# Patient Record
Sex: Male | Born: 1960
Health system: Southern US, Community
[De-identification: ages and names within clinical notes are randomized; demographics above are authoritative.]

## PROBLEM LIST (undated history)

## (undated) DIAGNOSIS — N390 Urinary tract infection, site not specified: Secondary | ICD-10-CM

## (undated) DIAGNOSIS — I10 Essential (primary) hypertension: Secondary | ICD-10-CM

## (undated) DIAGNOSIS — K859 Acute pancreatitis without necrosis or infection, unspecified: Secondary | ICD-10-CM

## (undated) DIAGNOSIS — Q6410 Exstrophy of urinary bladder, unspecified: Secondary | ICD-10-CM

## (undated) DIAGNOSIS — Z9289 Personal history of other medical treatment: Secondary | ICD-10-CM

## (undated) DIAGNOSIS — Q899 Congenital malformation, unspecified: Secondary | ICD-10-CM

## (undated) DIAGNOSIS — Z87442 Personal history of urinary calculi: Secondary | ICD-10-CM

## (undated) DIAGNOSIS — I2510 Atherosclerotic heart disease of native coronary artery without angina pectoris: Secondary | ICD-10-CM

## (undated) DIAGNOSIS — C801 Malignant (primary) neoplasm, unspecified: Secondary | ICD-10-CM

## (undated) DIAGNOSIS — N182 Chronic kidney disease, stage 2 (mild): Secondary | ICD-10-CM

## (undated) DIAGNOSIS — E785 Hyperlipidemia, unspecified: Secondary | ICD-10-CM

## (undated) HISTORY — DX: Atherosclerotic heart disease of native coronary artery without angina pectoris: I25.10

## (undated) HISTORY — DX: Personal history of other medical treatment: Z92.89

## (undated) HISTORY — PX: KIDNEY SURGERY: SHX687

## (undated) HISTORY — DX: Essential (primary) hypertension: I10

## (undated) HISTORY — DX: Acute pancreatitis without necrosis or infection, unspecified: K85.90

## (undated) HISTORY — DX: Personal history of urinary calculi: Z87.442

## (undated) HISTORY — DX: Urinary tract infection, site not specified: N39.0

## (undated) HISTORY — DX: Congenital malformation, unspecified: Q89.9

---

## 1998-09-21 ENCOUNTER — Encounter: Payer: Self-pay | Admitting: Endocrinology

## 1998-09-21 ENCOUNTER — Encounter: Payer: Self-pay | Admitting: Emergency Medicine

## 1998-09-21 ENCOUNTER — Inpatient Hospital Stay (HOSPITAL_COMMUNITY): Admission: EM | Admit: 1998-09-21 | Discharge: 1998-09-24 | Payer: Self-pay | Admitting: Emergency Medicine

## 2006-03-08 HISTORY — PX: CORONARY ARTERY BYPASS GRAFT: SHX141

## 2006-03-17 ENCOUNTER — Emergency Department (HOSPITAL_COMMUNITY): Admission: EM | Admit: 2006-03-17 | Discharge: 2006-03-17 | Payer: Self-pay | Admitting: Emergency Medicine

## 2006-03-20 ENCOUNTER — Ambulatory Visit (HOSPITAL_COMMUNITY): Admission: RE | Admit: 2006-03-20 | Discharge: 2006-03-20 | Payer: Self-pay | Admitting: Cardiology

## 2006-03-20 HISTORY — PX: CARDIAC CATHETERIZATION: SHX172

## 2006-03-31 ENCOUNTER — Ambulatory Visit: Payer: Self-pay | Admitting: Surgery

## 2006-04-07 ENCOUNTER — Inpatient Hospital Stay (HOSPITAL_COMMUNITY): Admission: RE | Admit: 2006-04-07 | Discharge: 2006-04-12 | Payer: Self-pay | Admitting: Surgery

## 2006-04-07 ENCOUNTER — Ambulatory Visit: Payer: Self-pay | Admitting: Surgery

## 2006-05-06 ENCOUNTER — Ambulatory Visit: Payer: Self-pay | Admitting: Surgery

## 2006-05-19 ENCOUNTER — Encounter (HOSPITAL_COMMUNITY): Admission: RE | Admit: 2006-05-19 | Discharge: 2006-07-14 | Payer: Self-pay | Admitting: Cardiology

## 2009-06-12 HISTORY — PX: CARDIOVASCULAR STRESS TEST: SHX262

## 2009-07-26 ENCOUNTER — Encounter: Admission: RE | Admit: 2009-07-26 | Discharge: 2009-07-26 | Payer: Self-pay | Admitting: Cardiology

## 2009-07-28 ENCOUNTER — Inpatient Hospital Stay (HOSPITAL_BASED_OUTPATIENT_CLINIC_OR_DEPARTMENT_OTHER): Admission: RE | Admit: 2009-07-28 | Discharge: 2009-07-28 | Payer: Self-pay | Admitting: Cardiology

## 2009-07-28 HISTORY — PX: CARDIAC CATHETERIZATION: SHX172

## 2009-08-11 ENCOUNTER — Ambulatory Visit: Payer: Self-pay | Admitting: Cardiology

## 2010-05-22 NOTE — Discharge Summary (Signed)
NAME:  Andrew Joyce, Andrew Joyce             ACCOUNT NO.:  [id number]   MEDICAL RECORD NO.:  [id number]          PATIENT TYPE:  INP   LOCATION:  2035                         FACILITY:  [facility]   PHYSICIAN:  Evelene Croon, M.D.     DATE OF BIRTH:  Jun 07, 1960   DATE OF ADMISSION:  04/07/2006  DATE OF DISCHARGE:  04/12/2006                               DISCHARGE SUMMARY   ADDENDUM:  This is an addendum to a previously dictated discharge summary.  See job  number [id number] for details outlining admission and discharge diagnoses,  history and hospital course through April 10, 2006.   Initially, it was anticipated Mr. Deman would be discharged home  postoperative day four, April 11, 2006; however during morning rounds he  was noted to have had a fever overnight with temperature of 101.3.  Source is felt most likely secondary to atelectasis or pericarditis.  His chest x-ray on April 2nd showed decreased lung volumes with  worsening bibasilar atelectasis.  Encouraged more aggressive incentive  spirometry and a flutter valve was added.  He was also treated with 48  hours of Indocin, as he had already received 48 hours of Toradol for  postoperative pericarditis.  A urinalysis was also sent which showed few  bacteria, but otherwise was unremarkable.  He was restarted on his home  regimen of Bactrim.  Urine culture ultimately showed insignificant  growth.  His white blood count was normal at 7.7.  Following morning,  his high fevers had resolved although with still intermittently low-  grade temperatures around 100.  Otherwise, there is no significant  change from his previous dictated discharge summary and he was felt  appropriate for discharge home on postoperative day five, May 12, 2006,  in stable condition.   UPDATED DISCHARGE MEDICATIONS:  1. Coated aspirin 325 mg p.o. every day.  2. Lopressor 25 mg p.o. b.i.d.  3. Quinapril 20 mg p.o. every day.  4. Lipitor 20 mg p.o. every day.  5. Tricor 145 mg  p.o. every day.  6. Bactrim p.o. b.i.d.  7. Oxycodone 5 mg one or two tablets p.o. q.4-6 hours p.r.n. pain.   DISCHARGE INSTRUCTIONS:  As previously dictated.     Jerold Coombe, P.A.      Evelene Croon, M.D.  Electronically Signed   AWZ/MEDQ  D:  06/03/2006  T:  06/03/2006  Job:  [id number]   cc:   Peter M. [place], M.D.

## 2010-05-25 NOTE — Cardiovascular Report (Signed)
NAME:  Andrew Joyce, Andrew Joyce NO.:  [id number]   MEDICAL RECORD NO.:  [id number]          PATIENT TYPE:  OIB   LOCATION:  2899                         FACILITY:  [facility]   PHYSICIAN:  Peter M. Swaziland, M.D.  DATE OF BIRTH:  [birth year]   DATE OF PROCEDURE:  03/20/2006  DATE OF DISCHARGE:  03/20/2006                            CARDIAC CATHETERIZATION   INDICATIONS FOR PROCEDURE:  A 50 year old white male who presented with  recent onset of angina.  He had an abnormal stress Cardiolite study  showing evidence of inferior wall ischemia.  He has a history of severe  hypertension and hyperlipidemia.   PROCEDURE:  1. Left heart catheterization.  2. Coronary and left ventricular angiography.   EQUIPMENT:  Used a 6-French 4-cm right and left Judkins catheters, a 6-  French pigtail catheter, a 6-French arterial sheath.   MEDICATIONS:  Local anesthesia of 1% Xylocaine, Versed 2 mg IV.   CONTRAST:  Omnipaque 105 mL.   HEMODYNAMIC DATA:  Aortic pressures 120/78 with a mean of 97-mmHg, left  ventricular pressure is 127 with EDP of 10-mmHg.   ANGIOGRAPHIC DATA:  Left coronary artery arises and distributes  normally.  The left main coronary artery is normal.   The left anterior descending artery has a fusiform 70% stenosis in the  proximal vessel.  The remainder of the vessel has scattered wall  irregularities.   The left circumflex coronary is a co-dominant vessel.  It gives rise to  a single large bifurcating marginal vessel and then continues in the AV  groove to give off some posterolateral branches distally.  The obtuse  marginal vessel has a 60-70% stenosis at the bifurcation involving both  branches.   The right coronary arises normally.  He gives rise to the PDA.  It is  relatively small in caliber and diffusely diseased.  There is diffuse  90% stenosis involving the proximal vessel and the mid vessel.  The  distal vessel has segmental disease up to 60-70%.   LEFT  VENTRICULAR ANGIOGRAPHY:  Performed in the RAO view demonstrates a  normal left ventricular size and contractility with normal systolic  function.  Ejection fraction is estimated 60-65%.   ABDOMINAL AORTOGRAPHY:  Was performed to rule out renal artery stenosis  in a patient with severe hypertension.  This demonstrates normal  abdominal aorta.  The celiac, mesenteric, and renal arteries are widely  patent.   FINAL INTERPRETATION:  1. Three-vessel obstructive coronary disease.  The patient has      moderate stenosis in the proximal left anterior descending artery      and the bifurcating obtuse marginal vessel.  He has severe diffuse      disease in the right coronary.  2. Normal left ventricle function.  3. No evidence of renal artery stenosis.   PLAN:  The patient's right coronary is poorly suited to percutaneous  intervention given its small caliber and diffuse disease.  Given the  moderate disease in the left coronary system, I would consider coronary  artery bypass surgery as a more attractive treatment option.  ______________________________  Peter M. [place], M.D.     PMJ/MEDQ  D:  03/20/2006  T:  03/21/2006  Job:  [id number]   cc:   Alfonse Alpers. Dagoberto Ligas, M.D.

## 2010-05-25 NOTE — Discharge Summary (Signed)
NAME:  Andrew Joyce, Andrew Joyce             ACCOUNT NO.:  [id number]   MEDICAL RECORD NO.:  [id number]          PATIENT TYPE:  INP   LOCATION:  2035                         FACILITY:  [facility]   PHYSICIAN:  Evelene Croon, M.D.     DATE OF BIRTH:  20-Apr-1960   DATE OF ADMISSION:  04/07/2006  DATE OF DISCHARGE:  04/11/2006                               DISCHARGE SUMMARY   ADMISSION DIAGNOSIS:  Severe three-vessel coronary artery disease.   DISCHARGE/SECONDARY DIAGNOSES:  1. Severe three-vessel coronary artery disease status post coronary      artery bypass grafting.  2. Hypertension.  3. Hyperlipidemia and hypertriglyceridemia (with levels over 3000).  4. History of pancreatitis in 2002 secondary to hypertriglyceridemia.  5. History of exstrophy of the bladder as a child status post surgical      repair.  6. History of recurrent urinary tract infections and bladder stones.  7. No known drug allergies.  8. Postoperative acute blood loss anemia requiring transfusion.  9. Postoperative pericarditis, resolving.   PROCEDURES:  April 07, 2006:  A mediastinotomy for coronary artery  bypass graft surgery x5 using the left internal mammary artery to the  left anterior descending, saphenous vein graft to the acute marginal  branch of the right coronary artery, saphenous graft to the posterior  descending branch of the right coronary artery, sequential saphenous  vein graft to the first and second obtuse marginal branches of the left  circumflex coronary artery, endoscopic vein harvest obtained from  bilateral legs.   SURGEON:  Dr. Evelene Croon   BRIEF HISTORY:  Andrew Joyce is a 50 year old male with history of  hypertension and severe hypertriglyceridemia who presented to the [facility] emergency department on March 17, 2006 with substernal chest pain  radiating to his left arm with numbness in his left hand.  His initial  enzymes were negative.  Electrocardiogram was normal.  Cardiolite scan  showed evidence of inferior wall ischemia.  There was normal left  ventricular function.  Cardiac catheterization on March 13 showed  significant three-vessel coronary artery disease.  The left main was  normal.  The left ventricular ejection fraction was 60 to 65% with no  mitral regurgitation, no gradient across the aortic valve.  He was  subsequently referred for cardiac surgery evaluation and was seen on  outpatient basis by Dr. Laneta Simmers on March 31, 2006.  After review of the  angiogram and examination of the patient, he believed that coronary  artery bypass graft surgery was the best treatment option to prevent  further ischemia and infarction.  After discussing risks and benefits,  the patient agreed to proceed.  Of note, he had a normal carotid duplex  scan preoperatively.   HOSPITAL COURSE:  On April 07, 2006, Andrew Joyce was electively  admitted to [facility] and underwent coronary artery bypass  graft surgery.  Postoperatively, he was transferred to the surgical care  unit and he was in hemodynamically stable condition.  By postoperative  day one, he had been extubated neurologically intact.  He did require  some Neo-Synephrine for postoperative vasodilation.  He also was  treated  for postoperative acute blood loss anemia for a hemoglobin of 7.8 and  was transfused one unit of packed red blood cells.  Postoperative EKG  showed diffuse ST changes consistent with pericarditis.  This  subsequently improved over the course of a couple of days.  He was  ________ from the Neo-Synephrine and chest tube ________ were  discontinued on postoperative day one.  Per cardiac surgery protocol,  his blood sugars were monitored postoperatively and he initially was  treated with Lantus insulin, but this was ultimately discontinued as his  sugars normalized.  His hemoglobin A1C was normal at 5.7.  Postoperative  day two, Andrew Joyce was felt appropriate for transfer out of surgical   intensive care unit and out to telemetry unit 2000 where it is  anticipated he will remain until discharge.  He has remained stable and  after discharge he has maintained normal sinus rhythm and vital signs  remained stable with systolic blood pressure ranging from 102 to 132,  diastolic in the 60s to mid-70s.  Currently, he is tolerating 25 mg  twice-a-day regimen of Lopressor.  His ACE inhibitor will be resumed as  his blood pressure allows which may be on an outpatient basis.  His  oxygen saturation had been above 92% on room air.  His weight is nearly  at its baseline.  He was treated with a few-day course of diuretic  therapy.  He has had intermittent low-grade fevers of around 100 which  is felt likely secondary to atelectasis.  Mobility has been encouraged  with nursing and cardiac rehab staff as well as aggressive pulmonary  toilet.  Chest x-ray has shown low lung volumes with bibasilar  atelectasis, but no pneumothorax.  His incisions were healing well  without sign of infection.  His pain is controlled on oral medication  and he is making progress with cardiac rehab.  He has been tolerating  diet and bowel and bladder with good returning function.  If he  continues to make steady progress and there are no significant changes  in status, it is anticipated he will be ready for discharge home on  postoperative day four, April 11, 2006.   His most recent labs show white blood count of 7.7, hemoglobin and  hematocrit of 8 and 22.9, respectively, platelet count 110, sodium 139,  potassium 4.3, chloride 105, CO2 28, BUN 14, creatinine 1.09.  Fasting  blood glucose is 146.  LFTs were normal.  Hemoglobin A1c 5.7.   DISCHARGE MEDICATIONS:  1. Coated aspirin 325 mg p.o. every day.  2. Lopressor 25 mg p.o. b.i.d.  3. Lipitor 20 mg p.o. every day.  4. Tricor 145 mg p.o. every day. 5. Oxycodone 5 mg one to two tablets p.o. q.4-6 h p.r.n. pain.   DISCHARGE INSTRUCTIONS:  He is to avoid  driving or heavy lifting more  than ten pounds.  He is encouraged to continue daily walking and  breathing exercises.  He is to follow a low fat, low-salt diet.  He may  shower and clean incisions gently with soap and water, should notify Dr.  Sharee Pimple office if he develops fever greater than 101 or redness or  drainage from his incision sites.   FOLLOWUP:  He is to follow up with Dr. Laneta Simmers in his office  approximately three weeks and he should call to schedule to follow up  with Dr. Peter Sw[place]iland and should have a chest x-ray at this appointment  as well.  Jerold Coombe, P.A.      Evelene Croon, M.D.  Electronically Signed    AWZ/MEDQ  D:  04/10/2006  T:  04/10/2006  Job:  [id number]   cc:   Peter M. [place], M.D.

## 2010-05-25 NOTE — H&P (Signed)
NAME:  NUSSEN, PULLIN NO.:  [id number]   MEDICAL RECORD NO.:  [id number]          PATIENT TYPE:  EMS   LOCATION:  MAJO                         FACILITY:  [facility]   PHYSICIAN:  Peter M. [place], M.D.  DATE OF BIRTH:  [birth year]   DATE OF ADMISSION:  03/17/2006  DATE OF DISCHARGE:                              HISTORY & PHYSICAL   HISTORY OF PRESENT ILLNESS:  Mr. Glace is a 50 year old white male  who has a history of hypertension, hyperlipidemia and presents to the  emergency department for evaluation of chest pain.  The patient states  he felt fine until this morning.  He was at work approximately 8:00 a.m.  He was doing some simple task at the office without any heavy lifting or  straining, when he developed the sudden gripping midsternal chest pain  associated with a symptom that he could not take a deep breath.  He then  develop some left hand numbness.  He had some mild sweating and a  shiver.  He had no nausea, vomiting.  He had no prior history of chest  pain.  This pain was at least moderate intensity for the first 45  minutes and then abated with a low-grade discomfort lasting  approximately another 45 minutes to an hour. His pain has now completely  resolved and he feels fine.   PAST MEDICAL HISTORY:  Significant for hypertension.  He has a history  of hyperlipidemia with prior triglyceride level over 3000.  He has a  history of pancreatitis in 2000 secondary to his hypertriglyceridemia.  He has a history of exstrophy as of a bladder as a child and had surgery  for repair.  He has had recurrent urinary tract infections and bladder  stones.   CURRENT MEDICATIONS:  1. Antara 130 mg per day.  2. Tenormin 100 mg per day.  3. Norvasc 10 mg per day.  4. Accupril 20 mg per day.  5. Maxzide 25 mg 1/2 tablet daily.   ALLERGIES:  No known allergies.   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  He is married  and has 2 children.  He denies  tobacco or alcohol use.   FAMILY HISTORY:  Positive for hypercholesterolemia and hypertension.  He  has no history of coronary disease.   PHYSICAL EXAMINATION:  Patient is a pleasant white male, no distress.  Blood pressure 142/90, pulse 73 and regular, respirations were 20 and  unlabored.  He is afebrile.  Sats are  99% on room air.  HEENT EXAM:  Unremarkable.  He has no JVD, adenopathy or bruits.  LUNGS:  Clear.  CARDIAC EXAM:  A regular rate and rhythm without gallop, murmur, rub or  click.  ABDOMEN:  Soft, nontender.  There is no chest wall tenderness to  palpation.  There are no masses.  EXTREMITIES:  Without edema.  Pulses are 2+ and symmetric.  He has no  phlebitis.  NEUROLOGIC EXAM:  Nonfocal.   LABORATORY DATA:  Chest x-ray is normal. ECG is normal.  White count  4600, hemoglobin 14.3, hematocrit  40.5, platelets 220,000.  Sodium is  134, potassium 5.1, chloride 109, CO2 30, BUN 11, creatinine 1, glucose  150.  LFTs were normal.  Point of care cardiac enzymes are negative x2.   IMPRESSION:  1. Chest pain with atypical features.  2. Hypertension.  3. Hyperlipidemia.   PLAN:  The patient was given aspirin in the emergency department.  We  discussed admitting him overnight for observation.  However, the patient  is very adamant about wanting to return home.  As such, we have  scheduled him for a stress Cardiolite study tomorrow at noon at our  office.  He is to remain on aspirin daily.  He is to call and return to  the emergency department if he has any recurrent chest pain.  I have  recommended he go home and relax today and perform no work today.           ______________________________  Peter M. [place], M.D.     PMJ/MEDQ  D:  03/17/2006  T:  03/17/2006  Job:  [id number]   cc:   Alfonse Alpers. Dagoberto Ligas, M.D.  Bertram Millard. Dahlstedt, M.D.

## 2010-05-25 NOTE — Op Note (Signed)
NAME:  Andrew Joyce, Andrew Joyce             ACCOUNT NO.:  [id number]   MEDICAL RECORD NO.:  [id number]          PATIENT TYPE:  INP   LOCATION:  2307                         FACILITY:  [facility]   PHYSICIAN:  Evelene Croon, M.D.     DATE OF BIRTH:  [birth year]   DATE OF PROCEDURE:  04/07/2006  DATE OF DISCHARGE:                               OPERATIVE REPORT   PREOPERATIVE DIAGNOSIS:  Severe three vessel coronary artery disease.   POSTOPERATIVE DIAGNOSIS:  Severe three vessel coronary artery disease.   OPERATIVE PROCEDURE:  Median sternotomy, extracorporeal circulation,  coronary artery bypass graft surgery times 5 using a left internal  mammary artery graft to the left anterior descending coronary artery,  with a saphenous vein graft to the acute marginal branch of the right  coronary artery, a saphenous vein graft to the posterior descending  branch of the right coronary artery, and a sequential saphenous vein  graft to the first and second obtuse marginal branches of the left  circumflex coronary artery; endoscopic vein harvesting from both legs.   ATTENDING SURGEON:  Evelene Croon, M.D.   ASSISTANTS:  1. Salvatore Decent. Cornelius Moras, M.D.  2. Rowe Clack, P.A.-C.   ANESTHESIA:  General endotracheal.   CLINICAL HISTORY:  This patient is a 50 year old gentleman with a  history of hypertension and severe hyperlipidemia, who presented to  [facility] Emergency Department on 03/17/2006 with substernal chest pain  radiating to his left arm, with numbness in his left hand.  His initial  enzymes were negative.  Electrocardiogram was normal.  Cardiolite scan  showed evidence of inferior wall ischemia.  There was normal left  ventricular function.  Cardiac catheterization on 03/20/2006 showed  significant three vessels coronary artery disease.  The left main was  normal.  The LAD had a long fusiform 70% proximal stenosis.  The left  circumflex was a codominant vessel and gave rise to a single large  bifurcating marginal vessel that had a 60 to 70% bifurcation stenosis  involving both branches.  The right coronary artery was a relatively  small vessel that was diffusely diseased, up to 90% proximally, and in  the midportion.  The distal vessel had 60 to 70% stenosis before the  takeoff of the posterior descending branch.  There was a moderate-size  acute marginal branch that high-grade proximal stenosis.  The left  ventricular ejection fraction was 60 to 65%, with no mitral  regurgitation and no gradient across the aortic valve.  After review of  the angiogram and examination of the patient, it was felt that coronary  artery bypass graft surgery was the best treatment to prevent further  ischemia and infarction.  I discussed the operative procedure with the  patient and his wife, including alternatives, benefits and risks,  including but not limited to bleeding, blood transfusion, infection,  stroke, myocardial infarction, graft failure, and death.  He understood  and agreed to proceed.   OPERATIVE PROCEDURE:  The patient was taken to the operating room and  placed on the table in supine position.  After induction of general  endotracheal anesthesia, a Foley catheter was  placed in the bladder  using sterile technique.  Then, the chest, abdomen and both lower  extremities were prepped and draped in the usual sterile manner.  The  chest was entered through a median sternotomy incision and the  pericardium opened in the midline.  Examination of the heart showed good  ventricular contractility.  The ascending aorta had no palpable plaques  in it.   Then, the left internal mammary artery was harvested from the chest wall  as a pedicle graft.  This was a medium-caliber vessel with excellent  blood flow through it.  At the same time, a segment of greater saphenous  vein was harvested from the right leg using endoscopic vein harvest  technique.  This vein and thigh was medium size and good  quality.  Below  the knee, the vein was small and not felt to be optimal.  Therefore,  another section of vein was harvested from the left thigh using  endoscopic vein harvest technique.  This vein was medium size and of  good quality.   Then, the patient was heparinized, and when an adequate activated  clotting time was achieved, the distal ascending aorta was cannulated  using a 20-French aortic cannula for arterial inflow.  Venous outflow  was achieved using a 2-stage venous cannula through the right atrial  appendage.  An antegrade cardioplegia and vent cannula was inserted in  the aortic root.   The patient was placed on cardiopulmonary bypass and the distal  coronaries identified.  The LAD was a large graftable vessel in its  midportion.  It quickly became a relatively small vessel and did not  reach to the apex.  It had no significant distal disease in it.  The 2  marginal branches were both large, graftable vessels with no significant  distal disease present.  The right coronary artery gave off a small-to-  medium size acute marginal branch that was felt to be graftable, as well  as a moderate-size posterior descending branch that was graftable.  The  main body of the right coronary artery was diffusely diseased with  plaque.   Then, the aorta was crossclamped and 1000 mL of cold-blood antegrade  cardioplegia was administered into the aortic root, with quick arrest of  the heart.  Systemic hypothermia to 28 degrees Centigrade and topical  hypothermia with ice saline was used.  A temperature probe was placed on  the septum and an insulating pad on the pericardium.   The first distal anastomosis was performed first to the marginal branch.  The internal diameter of this vessel was about 1.75 mm.  The conduit  used was a segment of greater saphenous vein, and the anastomosis was  performed in a sequential, side-to-side manner using continuous 7-0 Prolene suture.  Flow was  measured through the graft and was excellent.   The second distal anastomosis was performed to the second marginal  branch.  The internal diameter was also about 1.75 mm.  The conduit used  was the same segment of greater saphenous vein, and the anastomosis was  performed in an end-to-side manner using continuous 7-0 Prolene suture.  Flow was measured through the graft and was excellent.  Then, another  dose of cardioplegia was given down the vein grafts and in the aortic  root.   The third distal anastomosis was performed to the posterior descending  coronary artery.  The internal diameter proximally was about 1.75 mm.  The conduit used was a third segment of greater  saphenous vein, and the  anastomosis was performed in an end-to-side manner using continuous 7-0  Prolene suture.  Flow was measured through the graft and was excellent.   The fourth distal anastomosis was performed to the acute marginal  branch.  The internal diameter was about 1.5 to 1.6 mm.  The conduit  used was the third segment of greater saphenous vein, and the  anastomosis was performed in an end-to-side manner using continuous 7-0  Prolene suture.  Flow was measured through the graft and was good.   Then, the fifth distal anastomosis was performed to the midportion of  the left anterior descending coronary artery.  The internal diameter was  about 2 mm.  The conduit used was a left internal mammary artery graft,  and it was brought through an opening in the left pericardium anterior  to the phrenic nerve.  It was anastomosed to the LAD in end-to-side  manner using continuous 8-0 Prolene suture.  The pedicle was sutured to  the epicardium with 6-0 Prolene sutures.  The patient was then rewarmed  to 37 degrees Centigrade, and another dose of antegrade cardioplegia was  given.  With the crossclamp in place, the 3 proximal vein graft  anastomoses were performed to the aortic root in end-to-side manner  using  continuous 6-0 Prolene suture.  Then, the clamp was removed from  the mammary artery pedicle.  There was rapid warming of the ventricular  septum and return of spontaneous ventricular fibrillation.  The  crossclamp was removed at a time of 88 minutes, and the patient  spontaneously converted to sinus rhythm.  The proximal and distal  anastomoses appeared hemostatic and the lie of the grafts satisfactory.  A graft marker was placed around the proximal anastomoses.  Two  temporary right ventricular and right atrial pacing wires were placed  through the skin.   When the patient rewarmed to 37 degrees Centigrade, he was weaned from  cardiopulmonary bypass on no inotropic agents.  The total bypass time  was 110 minutes.  Cardiac function appeared excellent, with a cardiac  output of 6 L per minute.  Protamine was given and the venous and aortic  cannulae were removed without difficulty.  Hemostasis was achieved. Three chest tubes were placed with 2 in the posterior pericardium, one  in the left pleural space and one in the anterior mediastinum.  The  pericardium was loosely reapproximated over the heart.  The sternum was  closed with #6 stainless steel wires.  The fascia was closed with a  continuous #1 Vicryl suture.  The subcutaneous tissues were closed with  a continuous 2-0 Vicryl, and the skin with a 3-0 Vicryl subcuticular  closure.  The lower extremity vein harvest sites were closed in layers  in a similar manner.  The sponge, needle and instrument counts were  correct according to the scrub nurse.  Dry sterile dressings were  applied over the incisions and around the chest tubes, which were hooked  to Pleur-evac suction.  The patient remained hemodynamically stable and  was transported to the SICU in guarded, stable condition.      Evelene Croon, M.D.  Electronically Signed     BB/MEDQ  D:  04/07/2006  T:  04/07/2006  Job:  [id number]   cc:   Dr Peter Swaziland  Cardiac Cath Lab,  [facility]

## 2010-08-13 ENCOUNTER — Other Ambulatory Visit: Payer: Self-pay | Admitting: Cardiology

## 2010-08-13 NOTE — Telephone Encounter (Signed)
escribe medication per fax request  

## 2010-12-12 ENCOUNTER — Other Ambulatory Visit: Payer: Self-pay | Admitting: Cardiology

## 2010-12-24 ENCOUNTER — Other Ambulatory Visit: Payer: Self-pay | Admitting: *Deleted

## 2010-12-25 ENCOUNTER — Telehealth: Payer: Self-pay | Admitting: Cardiology

## 2010-12-25 MED ORDER — QUINAPRIL HCL 40 MG PO TABS: 40.0000 mg | ORAL_TABLET | Freq: Every day | ORAL | Status: DC

## 2010-12-25 NOTE — Telephone Encounter (Signed)
Spoke w/wife. She states he missed his app last year because he lost his job and didn't have health insurance. Refilled his Quinipril but advised her he needs to keep his app in Jan.

## 2010-12-25 NOTE — Telephone Encounter (Signed)
New msg Pt wants refill of quinipril sent to [facility] on [address] untile his appt in January

## 2011-01-18 ENCOUNTER — Other Ambulatory Visit: Payer: Self-pay | Admitting: Cardiology

## 2011-01-18 ENCOUNTER — Encounter: Payer: Self-pay | Admitting: Cardiology

## 2011-01-18 MED ORDER — QUINAPRIL HCL 40 MG PO TABS: 40.0000 mg | ORAL_TABLET | Freq: Every day | ORAL | Status: DC

## 2011-01-18 NOTE — Telephone Encounter (Signed)
New msg His appt was moved from 1/18 to 1/30 and he needs refill of quinapril

## 2011-01-25 ENCOUNTER — Ambulatory Visit: Payer: Self-pay | Admitting: Cardiology

## 2011-02-06 ENCOUNTER — Encounter: Payer: Self-pay | Admitting: Nurse Practitioner

## 2011-02-06 ENCOUNTER — Ambulatory Visit (INDEPENDENT_AMBULATORY_CARE_PROVIDER_SITE_OTHER): Payer: Self-pay | Admitting: Nurse Practitioner

## 2011-02-06 ENCOUNTER — Ambulatory Visit: Payer: Self-pay | Admitting: Cardiology

## 2011-02-06 VITALS — BP 142/108 | HR 83 | Ht 70.0 in | Wt 178.0 lb

## 2011-02-06 DIAGNOSIS — I10 Essential (primary) hypertension: Secondary | ICD-10-CM | POA: Insufficient documentation

## 2011-02-06 DIAGNOSIS — E785 Hyperlipidemia, unspecified: Secondary | ICD-10-CM

## 2011-02-06 DIAGNOSIS — I2510 Atherosclerotic heart disease of native coronary artery without angina pectoris: Secondary | ICD-10-CM

## 2011-02-06 LAB — HEPATIC FUNCTION PANEL
ALT: 29 U/L (ref 0–53)
AST: 30 U/L (ref 0–37)
Albumin: 4.5 g/dL (ref 3.5–5.2)
Alkaline Phosphatase: 58 U/L (ref 39–117)
Bilirubin, Direct: 0 mg/dL (ref 0.0–0.3)
Total Bilirubin: 0.5 mg/dL (ref 0.3–1.2)
Total Protein: 7.8 g/dL (ref 6.0–8.3)

## 2011-02-06 LAB — BASIC METABOLIC PANEL
BUN: 30 mg/dL — ABNORMAL HIGH (ref 6–23)
CO2: 26 mEq/L (ref 19–32)
Calcium: 9.5 mg/dL (ref 8.4–10.5)
Chloride: 101 mEq/L (ref 96–112)
Creatinine, Ser: 1.8 mg/dL — ABNORMAL HIGH (ref 0.4–1.5)
GFR: 41.96 mL/min — ABNORMAL LOW (ref >60.00)
Glucose, Bld: 113 mg/dL — ABNORMAL HIGH (ref 70–99)
Potassium: 4.4 mEq/L (ref 3.5–5.1)
Sodium: 139 mEq/L (ref 135–145)

## 2011-02-06 LAB — LIPID PANEL
Cholesterol: 278 mg/dL — ABNORMAL HIGH (ref 0–200)
HDL: 36.1 mg/dL — ABNORMAL LOW (ref >39.00)
Total CHOL/HDL Ratio: 8
Triglycerides: 753 mg/dL — ABNORMAL HIGH (ref 0.0–149.0)
VLDL: 150.6 mg/dL — ABNORMAL HIGH (ref 0.0–40.0)

## 2011-02-06 MED ORDER — NITROGLYCERIN 0.4 MG SL SUBL: 0.4000 mg | SUBLINGUAL_TABLET | SUBLINGUAL | Status: DC | PRN

## 2011-02-06 MED ORDER — LISINOPRIL 40 MG PO TABS: 40.0000 mg | ORAL_TABLET | Freq: Every day | ORAL | Status: DC

## 2011-02-06 MED ORDER — PRAVASTATIN SODIUM 40 MG PO TABS: 40.0000 mg | ORAL_TABLET | Freq: Every evening | ORAL | Status: DC

## 2011-02-06 NOTE — Assessment & Plan Note (Signed)
He has been out of his Lipitor. No recent labs. Will place him on Pravachol 40 mg.

## 2011-02-06 NOTE — Assessment & Plan Note (Addendum)
He has known CAD with prior CABG. Last cath in 2011 showed the SVG to the acute margin/PD to be occluded. Native RCA is occluded. He is managed medically. Now with more symptoms but in the setting of hypertension. He has been out of his medicines for some time. We will get him back on 4 dollar medicine that he can afford. We will check his labs today. He cannot afford a stress test at this time. I have refilled his NTG as well. I am planning on seeing him back in one month. He is to call or go to the ER for any worsening of his symptoms. Patient is agreeable to this plan and will call if any problems develop in the interim.

## 2011-02-06 NOTE — Assessment & Plan Note (Signed)
Blood pressure is up. He has been out of his medicines. We will place him on Lisinopril 40 mg daily.

## 2011-02-06 NOTE — Patient Instructions (Signed)
We are going to check your labs today.  We are going to put you on Lisinopril 40 mg daily for your blood pressure  We are going to put you on Pravachol 40 mg daily for your cholesterol  I would like to see you in a month.  I have also refilled your NTG to [facility].  Use your NTG under your tongue for recurrent chest pain. May take one tablet every 5 minutes. If you are still having discomfort after 3 tablets in 15 minutes, call 911.

## 2011-02-06 NOTE — Progress Notes (Signed)
Andrew Joyce Date of Birth: May 24, 1960 Medical Record #[id number]  History of Present Illness: [name] is seen today for a follow up visit. He is seen for Dr. Swaziland. It is an approximate 17 month check. He has known CAD with prior CABG in 2008. Last cath was in 2011. His grafts were patent except for the SVG to the acute marginal and PDA. The native RCA is occluded. He remains on medical management. Other problems include HTN and hyperlipidemia.   He comes in today. He has not been doing so well. He has lost his job. He has no insurance. He has been out of his medicine for several months. He said he was doing ok until Christmas. He was getting toters out of the attic and got short of breath and had some heart racing. He did use NTG x 1 with prompt relief. This recurred a few days later doing the same activity. He has had some just with getting "stressed out" at home. He does not really exercise. His symptoms are similar to his prior chest pain syndrome. Blood pressure is up.   Current Outpatient Prescriptions on File Prior to Visit  Medication Sig Dispense Refill  . aspirin 325 MG tablet Take 325 mg by mouth daily.      . fish oil-omega-3 fatty acids 1000 MG capsule Take 4 g by mouth daily.      . metoprolol (TOPROL-XL) 50 MG 24 hr tablet Take 50 mg by mouth daily.        No Known Allergies  Past Medical History  Diagnosis Date  . Coronary artery disease     s/p CABG in 2008; s/p cath in 2011showing occluded SVG to the acute marginal and PD. Native RCA is occluded. He is managed medically  . Hyperlipidemia   . Hypertension   . Recurrent UTI   . History of renal stone     Past Surgical History  Procedure Date  . Cardiac catheterization 07/28/2009    EF 60%; Grafts patent except for SVG to the AM and PD. Native RCA is occluded.  . Cardiac catheterization 03/20/2006    EF 60-65%  . Coronary artery bypass graft 03/2006    LIMA GRAFT TO LAD, SAPHENOUS VEIN GRAFT TO THE  ACUTE MARGINAL BRANCH THE RIGHT CORONARY, SAPHENOUS VEIN GRAFT TO THE PDA, SEQUENTIAL VEIN GRAFT TO THE FIRST AND SECOND OBTUSE MARGINAL VESSELS  . Cardiovascular stress test 06/12/2009    EF 71%, SMALL AREA OF INFARCT/ISCHEMIA IN INFERIOR WALL; FELT TO NOT BE CHANGED.    History  Smoking status  . Never Smoker   Smokeless tobacco  . Not on file    History  Alcohol Use No    Family History  Problem Relation Age of Onset  . Hyperlipidemia Mother   . Hypertension Mother   . Hyperlipidemia Father   . Hypertension Father     Review of Systems: The review of systems is per the HPI.  He does have lots of stress with home and finances. All other systems were reviewed and are negative.  Physical Exam: BP 142/108  Pulse 83  Ht 5\' 10"  (1.778 m)  Wt 178 lb (80.74 kg)  BMI 25.54 kg/m2 Patient is pleasant and in no acute distress. Skin is warm and dry. Color is normal.  HEENT is unremarkable. Normocephalic/atraumatic. PERRL. Sclera are nonicteric. Neck is supple. No masses. No JVD. Lungs are clear. Cardiac exam shows a regular rate and rhythm. Abdomen is soft. Extremities are without edema.  Gait and ROM are intact. No gross neurologic deficits noted.   LABORATORY DATA: EKG shows sinus rhythm with nonspecific ST & T wave changes.   Assessment / Plan:

## 2011-02-07 ENCOUNTER — Other Ambulatory Visit: Payer: Self-pay

## 2011-02-07 DIAGNOSIS — I10 Essential (primary) hypertension: Secondary | ICD-10-CM

## 2011-02-07 LAB — LDL CHOLESTEROL, DIRECT: Direct LDL: 104.5 mg/dL

## 2011-02-14 ENCOUNTER — Other Ambulatory Visit (INDEPENDENT_AMBULATORY_CARE_PROVIDER_SITE_OTHER): Payer: Self-pay | Admitting: *Deleted

## 2011-02-14 DIAGNOSIS — I10 Essential (primary) hypertension: Secondary | ICD-10-CM

## 2011-02-14 LAB — BASIC METABOLIC PANEL
BUN: 26 mg/dL — ABNORMAL HIGH (ref 6–23)
CO2: 28 mEq/L (ref 19–32)
Calcium: 9.3 mg/dL (ref 8.4–10.5)
Chloride: 102 mEq/L (ref 96–112)
Creatinine, Ser: 1.6 mg/dL — ABNORMAL HIGH (ref 0.4–1.5)
GFR: 47.69 mL/min — ABNORMAL LOW (ref >60.00)
Glucose, Bld: 108 mg/dL — ABNORMAL HIGH (ref 70–99)
Potassium: 4.2 mEq/L (ref 3.5–5.1)
Sodium: 138 mEq/L (ref 135–145)

## 2011-02-25 ENCOUNTER — Other Ambulatory Visit: Payer: Self-pay

## 2011-02-25 ENCOUNTER — Encounter (HOSPITAL_COMMUNITY): Payer: Self-pay | Admitting: *Deleted

## 2011-02-25 ENCOUNTER — Emergency Department (HOSPITAL_COMMUNITY): Payer: Medicaid Other

## 2011-02-25 ENCOUNTER — Inpatient Hospital Stay (HOSPITAL_COMMUNITY)
Admission: EM | Admit: 2011-02-25 | Discharge: 2011-02-28 | DRG: 281 | Disposition: A | Discharge status: Home / Self Care | Payer: Medicaid Other | Source: Ambulatory Visit | Attending: Cardiology | Admitting: Cardiology

## 2011-02-25 DIAGNOSIS — I2582 Chronic total occlusion of coronary artery: Secondary | ICD-10-CM | POA: Diagnosis present

## 2011-02-25 DIAGNOSIS — N182 Chronic kidney disease, stage 2 (mild): Secondary | ICD-10-CM | POA: Diagnosis present

## 2011-02-25 DIAGNOSIS — Z7982 Long term (current) use of aspirin: Secondary | ICD-10-CM

## 2011-02-25 DIAGNOSIS — I214 Non-ST elevation (NSTEMI) myocardial infarction: Principal | ICD-10-CM | POA: Diagnosis present

## 2011-02-25 DIAGNOSIS — I25810 Atherosclerosis of coronary artery bypass graft(s) without angina pectoris: Secondary | ICD-10-CM | POA: Diagnosis present

## 2011-02-25 DIAGNOSIS — N390 Urinary tract infection, site not specified: Secondary | ICD-10-CM | POA: Diagnosis present

## 2011-02-25 DIAGNOSIS — I2510 Atherosclerotic heart disease of native coronary artery without angina pectoris: Secondary | ICD-10-CM | POA: Diagnosis present

## 2011-02-25 DIAGNOSIS — I129 Hypertensive chronic kidney disease with stage 1 through stage 4 chronic kidney disease, or unspecified chronic kidney disease: Secondary | ICD-10-CM | POA: Diagnosis present

## 2011-02-25 DIAGNOSIS — E785 Hyperlipidemia, unspecified: Secondary | ICD-10-CM | POA: Diagnosis present

## 2011-02-25 DIAGNOSIS — N179 Acute kidney failure, unspecified: Secondary | ICD-10-CM | POA: Diagnosis present

## 2011-02-25 DIAGNOSIS — I10 Essential (primary) hypertension: Secondary | ICD-10-CM | POA: Diagnosis present

## 2011-02-25 DIAGNOSIS — R079 Chest pain, unspecified: Secondary | ICD-10-CM

## 2011-02-25 HISTORY — DX: Exstrophy of urinary bladder, unspecified: Q64.10

## 2011-02-25 HISTORY — DX: Chronic kidney disease, stage 2 (mild): N18.2

## 2011-02-25 HISTORY — DX: Hyperlipidemia, unspecified: E78.5

## 2011-02-25 LAB — BASIC METABOLIC PANEL
BUN: 22 mg/dL (ref 6–23)
CO2: 29 mEq/L (ref 19–32)
Calcium: 9.5 mg/dL (ref 8.4–10.5)
Chloride: 105 mEq/L (ref 96–112)
Creatinine, Ser: 1.36 mg/dL — ABNORMAL HIGH (ref 0.50–1.35)
GFR calc Af Amer: 68 mL/min — ABNORMAL LOW (ref >90)
GFR calc non Af Amer: 58 mL/min — ABNORMAL LOW (ref >90)
Glucose, Bld: 108 mg/dL — ABNORMAL HIGH (ref 70–99)
Potassium: 4.4 mEq/L (ref 3.5–5.1)
Sodium: 140 mEq/L (ref 135–145)

## 2011-02-25 LAB — CARDIAC PANEL(CRET KIN+CKTOT+MB+TROPI)
CK, MB: 80.6 ng/mL (ref 0.3–4.0)
Total CK: 942 U/L — ABNORMAL HIGH (ref 7–232)
Troponin I: 9.56 ng/mL (ref <0.30)

## 2011-02-25 LAB — CBC
HCT: 37.9 % — ABNORMAL LOW (ref 39.0–52.0)
Hemoglobin: 13.3 g/dL (ref 13.0–17.0)
MCH: 30.9 pg (ref 26.0–34.0)
MCHC: 35.1 g/dL (ref 30.0–36.0)
MCV: 88.1 fL (ref 78.0–100.0)
Platelets: 170 10*3/uL (ref 150–400)
RBC: 4.3 MIL/uL (ref 4.22–5.81)
RDW: 13.6 % (ref 11.5–15.5)
WBC: 10 10*3/uL (ref 4.0–10.5)

## 2011-02-25 LAB — TROPONIN I: Troponin I: <0.3 ng/mL (ref <0.30)

## 2011-02-25 MED ORDER — OMEGA-3 FATTY ACIDS 1000 MG PO CAPS: 4.0000 g | ORAL_CAPSULE | Freq: Every day | ORAL | Status: DC

## 2011-02-25 MED ORDER — ASPIRIN 81 MG PO CHEW
CHEWABLE_TABLET | ORAL | Status: AC
  Administered 2011-02-25: 81 mg via ORAL
  Filled 2011-02-25: qty 1

## 2011-02-25 MED ORDER — NITROGLYCERIN IN D5W 200-5 MCG/ML-% IV SOLN
5.0000 ug/min | INTRAVENOUS | Status: DC
  Administered 2011-02-25: 5 ug/min via INTRAVENOUS
  Filled 2011-02-25: qty 250

## 2011-02-25 MED ORDER — SODIUM CHLORIDE 0.9 % IJ SOLN: 3.0000 mL | INTRAMUSCULAR | Status: DC | PRN

## 2011-02-25 MED ORDER — ASPIRIN 81 MG PO CHEW
324.0000 mg | CHEWABLE_TABLET | ORAL | Status: AC
  Administered 2011-02-25: 81 mg via ORAL
  Administered 2011-02-26: 324 mg via ORAL
  Filled 2011-02-25: qty 4

## 2011-02-25 MED ORDER — MORPHINE SULFATE 4 MG/ML IJ SOLN
6.0000 mg | Freq: Once | INTRAMUSCULAR | Status: AC
  Administered 2011-02-25: 6 mg via INTRAVENOUS
  Filled 2011-02-25: qty 2

## 2011-02-25 MED ORDER — HEPARIN BOLUS VIA INFUSION
4000.0000 [IU] | Freq: Once | INTRAVENOUS | Status: AC
  Administered 2011-02-25: 4000 [IU] via INTRAVENOUS

## 2011-02-25 MED ORDER — SODIUM CHLORIDE 0.9 % IV SOLN
INTRAVENOUS | Status: DC
  Administered 2011-02-25 – 2011-02-26 (×3): via INTRAVENOUS

## 2011-02-25 MED ORDER — ONDANSETRON HCL 4 MG/2ML IJ SOLN: 4.0000 mg | Freq: Four times a day (QID) | INTRAMUSCULAR | Status: DC | PRN

## 2011-02-25 MED ORDER — SIMVASTATIN 20 MG PO TABS
20.0000 mg | ORAL_TABLET | Freq: Every day | ORAL | Status: DC
  Administered 2011-02-25 – 2011-02-28 (×4): 20 mg via ORAL
  Filled 2011-02-25 (×4): qty 1

## 2011-02-25 MED ORDER — MORPHINE SULFATE 2 MG/ML IJ SOLN
2.0000 mg | INTRAMUSCULAR | Status: DC | PRN
  Administered 2011-02-25 – 2011-02-26 (×2): 2 mg via INTRAVENOUS
  Filled 2011-02-25 (×2): qty 1

## 2011-02-25 MED ORDER — ASPIRIN EC 81 MG PO TBEC
81.0000 mg | DELAYED_RELEASE_TABLET | Freq: Every day | ORAL | Status: DC
  Administered 2011-02-27 – 2011-02-28 (×2): 81 mg via ORAL
  Filled 2011-02-25 (×4): qty 1

## 2011-02-25 MED ORDER — OMEGA-3-ACID ETHYL ESTERS 1 G PO CAPS
2.0000 g | ORAL_CAPSULE | Freq: Every day | ORAL | Status: DC
  Administered 2011-02-25 – 2011-02-26 (×2): 2 g via ORAL
  Filled 2011-02-25 (×3): qty 2

## 2011-02-25 MED ORDER — SODIUM CHLORIDE 0.9 % IV SOLN: 250.0000 mL | INTRAVENOUS | Status: DC | PRN

## 2011-02-25 MED ORDER — SODIUM CHLORIDE 0.9 % IJ SOLN
3.0000 mL | Freq: Two times a day (BID) | INTRAMUSCULAR | Status: DC
  Administered 2011-02-26 – 2011-02-27 (×3): 3 mL via INTRAVENOUS

## 2011-02-25 MED ORDER — ONDANSETRON HCL 4 MG/2ML IJ SOLN
4.0000 mg | Freq: Once | INTRAMUSCULAR | Status: AC
  Administered 2011-02-25: 4 mg via INTRAVENOUS
  Filled 2011-02-25: qty 2

## 2011-02-25 MED ORDER — METOPROLOL SUCCINATE ER 50 MG PO TB24
50.0000 mg | ORAL_TABLET | Freq: Every day | ORAL | Status: DC
  Administered 2011-02-26 – 2011-02-28 (×3): 50 mg via ORAL
  Filled 2011-02-25 (×4): qty 1

## 2011-02-25 MED ORDER — HEPARIN SOD (PORCINE) IN D5W 100 UNIT/ML IV SOLN
1300.0000 [IU]/h | INTRAVENOUS | Status: DC
  Administered 2011-02-25: 1000 [IU]/h via INTRAVENOUS
  Filled 2011-02-25 (×3): qty 250

## 2011-02-25 MED ORDER — DIAZEPAM 5 MG PO TABS
5.0000 mg | ORAL_TABLET | ORAL | Status: AC
  Administered 2011-02-26: 5 mg via ORAL
  Filled 2011-02-25: qty 1

## 2011-02-25 MED ORDER — FENOFIBRATE 54 MG PO TABS
54.0000 mg | ORAL_TABLET | Freq: Every day | ORAL | Status: DC
  Administered 2011-02-25 – 2011-02-28 (×4): 54 mg via ORAL
  Filled 2011-02-25 (×4): qty 1

## 2011-02-25 MED ORDER — SODIUM CHLORIDE 0.9 % IJ SOLN
3.0000 mL | Freq: Two times a day (BID) | INTRAMUSCULAR | Status: DC
  Administered 2011-02-25: 3 mL via INTRAVENOUS

## 2011-02-25 NOTE — ED Notes (Signed)
Dinner tray ordered. Heart Healthy diet. 

## 2011-02-25 NOTE — ED Notes (Signed)
Patient resting and remains on monitor with 2L oxygen with sats of 99%, Family at bedside.

## 2011-02-25 NOTE — ED Notes (Signed)
Dinner tray delivered.

## 2011-02-25 NOTE — ED Notes (Addendum)
Patient states he got up around 9am and started to eat breakfast approx. 0930 and started to have chest pain and SOB when walking. Patient denies N/V. Patient took 3 nitro sublingual with no relief, EMS called. Patient states after nitro sub. Given by EMS he did have a little relief. Patient placed on monitor and 2L oxygen with sats of 99%.

## 2011-02-25 NOTE — ED Notes (Addendum)
Zina, RN caring for patient while nurse is on lunch break.

## 2011-02-25 NOTE — ED Notes (Signed)
[id number] Ready 

## 2011-02-25 NOTE — ED Notes (Signed)
PPatient resting and remains on monitor and 2L oxygen with sats 100%. Family at the bedside.

## 2011-02-25 NOTE — ED Notes (Signed)
[facility] at bedside. 

## 2011-02-25 NOTE — H&P (Addendum)
CARDIOLOGY ADMISSION NOTE  Patient ID: Andrew Joyce MRN: [id number] DOB/AGE: September 06, 1960 51 y.o.  Admit date: 02/25/2011 Primary Physician: None    Primary Cardiologist   Dr. Swaziland Chief Complaint    Chest pain HPI: Andrew Joyce is a 51 yo M with PMH of HTN, HLP, hx renal stones and UTI, CAD s/p CABG in 2008; Cardiolite in 06/2009 showed inferobasal ischemia, s/p cath in 2011showing occluded SVG to the acute marginal and PDA, Native RCA is occluded. He is being managed medically.  He presents today with complaint of chest pain that started at 9:30AM on day of admission. The pain is constant after he ate his breakfast , started as dull and then became sharp 6/10 in severity, associated with SOB and felt like his esophagus was "closing up"/tight on him and radiates to his left arm with numbness sensation.  The pain is exacerbated with exertion (which he actually notices in the past several months).  He took 2 nitroglycerin at home without much relief.  He denies any diaphoresis, N/V.  He states that the chest pain feels like his previous heart attacks.  He reports medication compliance with ASA and Lisinopril.  In EMS & ED, he has received ASA, Nitro SL, nitro gtt, morphine.  In the ED, he states that his pain is improved with Nitro gtt; however, it is still intermittent.      Past Medical History  Diagnosis Date  . Coronary artery disease     s/p CABG in 2008; s/p cath in 2011showing occluded SVG to the acute marginal and PD. Native RCA is occluded. He is managed medically  . Hyperlipidemia   . Hypertension   . Recurrent UTI   . History of renal stone History of Bladder Exstrophy Congenital- had multiple surgeries as a child     Past Surgical History  Procedure Date  . Cardiac catheterization 07/28/2009    EF 60%; Grafts patent except for SVG to the AM and PD. Native RCA is occluded.  . Cardiac catheterization 03/20/2006    EF 60-65%  . Coronary artery bypass graft 03/2006    LIMA  GRAFT TO LAD, SAPHENOUS VEIN GRAFT TO THE ACUTE MARGINAL BRANCH THE RIGHT CORONARY, SAPHENOUS VEIN GRAFT TO THE PDA, SEQUENTIAL VEIN GRAFT TO THE FIRST AND SECOND OBTUSE MARGINAL VESSELS  . Cardiovascular stress test 06/12/2009    EF 71%, SMALL AREA OF INFARCT/ISCHEMIA IN INFERIOR Andrew Joyce; FELT TO NOT BE CHANGED.    No Known Allergies    Current Outpatient Prescriptions on File Prior to Encounter  Medication Sig Dispense Refill  . aspirin 325 MG tablet Take 325 mg by mouth daily.      . fish oil-omega-3 fatty acids 1000 MG capsule Take 4 g by mouth daily.      [name] lisinopril (PRINIVIL,ZESTRIL) 40 MG tablet Take 1 tablet (40 mg total) by mouth daily.  30 tablet  11  . metoprolol (TOPROL-XL) 50 MG 24 hr tablet Take 50 mg by mouth daily.      . pravastatin (PRAVACHOL) 40 MG tablet Take 1 tablet (40 mg total) by mouth every evening.  30 tablet  11   Social History  He is unemployed, has 2 healthy children, denies any smoking, alcohol, or illicit drugs. He does exercise/lifting weight 5 days per week at the gym.    Family History  Problem Relation Age of Onset  . Hyperlipidemia Mother   . Hypertension Mother   . Hyperlipidemia Father   . Hypertension Father  Physical Exam: Blood pressure 133/78, pulse 67, temperature 97.5 F (36.4 C), temperature source Oral, resp. rate 18, SpO2 100.00%.  General: alert, well-developed, and cooperative to examination.  Head: normocephalic and atraumatic.  Eyes: vision grossly intact, pupils equal, pupils round, pupils reactive to light, no injection and anicteric.  Mouth: pharynx pink and moist, no erythema, and no exudates.  Neck: supple, full ROM, no thyromegaly, no JVD, and no carotid bruits.  Lungs: normal respiratory effort, no accessory muscle use, normal breath sounds, no crackles, and no wheezes. Heart: normal rate, regular rhythm, no murmur, no gallop, and no rub. Healed mid sternum scar s/p CABG Abdomen: soft, non-tender, normal bowel sounds,  no distention, no guarding, no rebound tenderness, no hepatomegaly, and no splenomegaly.  Msk: no joint swelling, no joint warmth, and no redness over joints.  Pulses: 2+ DP/PT pulses bilaterally Extremities: No cyanosis, clubbing, edema Neurologic: alert & oriented X3, cranial nerves II-XII intact, strength normal in all extremities, sensation intact to light touch Skin: turgor normal and no rashes.  Psych: Oriented X3, memory intact for recent and remote, normally interactive, good eye contact, not anxious appearing, and not depressed appearing.  Labs: Lab Results  Component Value Date   BUN 22 02/25/2011   Lab Results  Component Value Date   CREATININE 1.36* 02/25/2011   Lab Results  Component Value Date   NA 140 02/25/2011   K 4.4 02/25/2011   CL 105 02/25/2011   CO2 29 02/25/2011   Lab Results  Component Value Date   TROPONINI <0.30 02/25/2011   Lab Results  Component Value Date   WBC 10.0 02/25/2011   HGB 13.3 02/25/2011   HCT 37.9* 02/25/2011   MCV 88.1 02/25/2011   PLT 170 02/25/2011   Lab Results  Component Value Date   CHOL 278* 02/06/2011   HDL 36.10* 02/06/2011   LDLDIRECT 104.5 02/06/2011   TRIG 753.0* 02/06/2011   CHOLHDL 8 02/06/2011   Lab Results  Component Value Date   ALT 29 02/06/2011   AST 30 02/06/2011   ALKPHOS 58 02/06/2011   BILITOT 0.5 02/06/2011      Radiology: CXR 02/25/11  Findings: The cardiac silhouette, mediastinal and hilar contours  are within normal limits and stable. Stable surgical changes from  bypass surgery. The lungs are clear. No pleural effusion. The  bony thorax is intact.  IMPRESSION:  No acute cardiopulmonary findings.   EKG:  T wave inversion in V1-2 compare to previous EKG 04/2006  ASSESSMENT AND PLAN:    1. Chest pain: concerning for angina pectoris.  Needs to rule out ACS given his CAD history with prior CABG in 2008.  His TIMI score is 4 which puts him at intermediate risk.  He did have a cardiac cath in 07/2009 which showed  "occluded SVG to the acute marginal and PDA. Native RCA is occluded. He is managed medically as outpatient." EKG has some T wave inversion in V1-2 compared to previous EKG, Tro is negative x1 so far. Other differential diagnosis include esophageal spasm. -Admit to telemetry -Start heparin gtt, nitroglycerin gtt -Continue ASA 81mg  poqd -Continue statin,  -Hold ACEi in setting of elevated Cr -EKG in AM -Cycle cardiac enzymes x 3 q8hrs -Plan for cardiac catheterization in AM -If he continues to have pain despite negative ACS work-up, may consider EGD or esophageal manometry and add calcium channel blocker or long acting nitrate to treat esophageal spasm.  2. HTN: BP 129/78, well-controlled with NTG gtt  -Will hold Lisinopril in  setting of elevated Cr  3. HLP: Last lipid panel 02/06/11 shows total chol 278, Triglycerides 753, HDL 36, LDL 150.  Not well controlled.  Patient reports having had Triglycerides in the 3000's when he had pancreatitis back in year 2002.  Ideally, would want LDL to be lower than 70. -Pravastatin to 40mg  poqd -Add fenofibrate 54mg  qd  4. Hx of renal stones: stable  5. Acute on chronic renal failure: Baseline Cr is 0.8-1 from 2000-2008; however, I cannot find other Cr levels from 2008-2012.  His recent Cr levels were in 02/2011 at 1.8 which trended back down to 1.36 today. Unclear what is the etiology of his renal failure; although he does have a history of renal stones and multiple UTIs, being followed by urologist. Colin Rhein is not likely as his ratio is less than 20.  This is likely post-renal vs. Renal given his multiple stones in the past. May need to ultrasound to see if he has obstruction; although he does not complain of any flank pain at this time.   -Will continue to monitor his BMP -Get U/A and microscopy -Will also hydrate him with IVF NS 125 cc/hr given that he will get cardiac cath in AM   Signed: HO,MICHELE 02/25/2011, 2:13 PM    Patient chart,  independent history and exam performed. Symptoms consistent with UAP. New T wave inversion in V1-2 from previous EKG. Will treat as above for ACS and proceed with cath in the am. Indications, risks, and potential benefit discussed with the patient and wife. They agree to proceed.

## 2011-02-25 NOTE — ED Notes (Signed)
Patient resting and remains on monitor with 2L oxygen with sats of 100% with NAD. Family at bedside.

## 2011-02-25 NOTE — ED Notes (Signed)
Report called to Crystal, RN [phone]) and patient placed on zoll and being transported to [id number].

## 2011-02-25 NOTE — ED Notes (Signed)
Patient remains on monitor and 2L oxygen with sats of 99%. Patient resting with family at bedside.

## 2011-02-25 NOTE — ED Notes (Signed)
Pt undressed, in gown, on monitor, continuous pulse oximetry, blood pressure cuff and oxygen [place] (2L); EKG performed; family at bedside 

## 2011-02-25 NOTE — ED Notes (Signed)
Patient states he is pain free at this time. Patient remains on monitor and 2L oxygen with sats of 100%. Family at the bedside.

## 2011-02-25 NOTE — ED Provider Notes (Signed)
Medical screening examination/treatment/procedure(s) were conducted as a shared visit with non-physician practitioner(s) and myself.  I personally evaluated the patient during the encounter  Cyndra Numbers, MD 02/25/11 2257

## 2011-02-25 NOTE — ED Notes (Signed)
Per EMS - 0930 patient with chest pain and 3 sublingual nitro before EMS arrived. 1 sublingual nitro by EMS prior to arrival.

## 2011-02-25 NOTE — ED Notes (Signed)
Patient resting and remains on monitor with sats of 100% on 2L. Family at bedside.

## 2011-02-25 NOTE — Progress Notes (Signed)
CRITICAL VALUE ALERT  Critical value received:  Troponin 9.5, CKMB 80.6  Date of notification:  02/25/11  Time of notification:  2003  Critical value read back: yes  Nurse who received alert:  D. Buzzy Han, RN  MD notified (1st page):  Ulyess Blossom  Time of first page:  2007  MD notified (2nd page):  Time of second page:  Responding MD:  Ulyess Blossom  Time MD responded:  2017

## 2011-02-25 NOTE — Progress Notes (Signed)
ANTICOAGULATION CONSULT NOTE - Initial Consult  Pharmacy Consult for UFH Indication: USAP  No Known Allergies  Patient Measurements: Height: 5' 10.08" (178 cm) Weight: 177 lb 14.6 oz (80.7 kg) IBW/kg (Calculated) : 73.18   Vital Signs: Temp: 97.5 F (36.4 C) (02/18 1130) Temp src: Oral (02/18 1130) BP: 129/78 mmHg (02/18 1419) Pulse Rate: 70  (02/18 1419)  Labs:  Basename 02/25/11 1212  HGB 13.3  HCT 37.9*  PLT 170  APTT --  LABPROT --  INR --  HEPARINUNFRC --  CREATININE 1.36*  CKTOTAL --  CKMB --  TROPONINI <0.30   Estimated Creatinine Clearance: 65.8 ml/min (by C-G formula based on Cr of 1.36).  Medical History: Past Medical History  Diagnosis Date  . Coronary artery disease     s/p CABG in 2008; s/p cath in 2011showing occluded SVG to the acute marginal and PD. Native RCA is occluded. He is managed medically  . Hyperlipidemia   . Hypertension   . Recurrent UTI   . History of renal stone   . Bladder exstrophy      Congenital- had multiple surgeries as a child    Medications:   (Not in a hospital admission)  Assessment: 51 y/o male patient admitted with significant cardiac history, and acute chest pain requiring anticoagulation for r/o MI. Cardiac enzymes neg x1, some EKG changes. Plan for cardiac cath in am.  Goal of Therapy:  Heparin level 0.3-0.7 units/ml   Plan:  Heparin 4000 unit IV bolus followed by infusion at 1000 units/hr. Check 6 hour heparin level with daily cbc and heparin level.  Verlene Mayer, PharmD, BCPS Pager [phone] 02/25/2011,4:45 PM

## 2011-02-25 NOTE — ED Provider Notes (Signed)
History     CSN: [id number]  Arrival date & time 02/25/11  1117   First MD Initiated Contact with Patient 02/25/11 1124      Chief Complaint  Patient presents with  . Chest Pain    (Consider location/radiation/quality/duration/timing/severity/associated sxs/prior treatment) HPI The patient presents to the ER with onset of CP that began this morning at 9:30. The patient states that he had some radiation to his mid neck area. He states that he took 3 nitro without relief. The patient states that he also had similar pain in the past with his cardiac chest pain. The patient denies SOB, weakness, N/V, sweating, abdominal pain, or back pain. The patient states that the EMS nitro seemed to help. Past Medical History  Diagnosis Date  . Coronary artery disease     s/p CABG in 2008; s/p cath in 2011showing occluded SVG to the acute marginal and PD. Native RCA is occluded. He is managed medically  . Hyperlipidemia   . Hypertension   . Recurrent UTI   . History of renal stone     Past Surgical History  Procedure Date  . Cardiac catheterization 07/28/2009    EF 60%; Grafts patent except for SVG to the AM and PD. Native RCA is occluded.  . Cardiac catheterization 03/20/2006    EF 60-65%  . Coronary artery bypass graft 03/2006    LIMA GRAFT TO LAD, SAPHENOUS VEIN GRAFT TO THE ACUTE MARGINAL BRANCH THE RIGHT CORONARY, SAPHENOUS VEIN GRAFT TO THE PDA, SEQUENTIAL VEIN GRAFT TO THE FIRST AND SECOND OBTUSE MARGINAL VESSELS  . Cardiovascular stress test 06/12/2009    EF 71%, SMALL AREA OF INFARCT/ISCHEMIA IN INFERIOR WALL; FELT TO NOT BE CHANGED.    Family History  Problem Relation Age of Onset  . Hyperlipidemia Mother   . Hypertension Mother   . Hyperlipidemia Father   . Hypertension Father     History  Substance Use Topics  . Smoking status: Never Smoker   . Smokeless tobacco: Not on file  . Alcohol Use: No      Review of Systems All pertinent positives and negatives reviewed in the  history of present illness  Allergies  Review of patient's allergies indicates no known allergies.  Home Medications   Current Outpatient Rx  Name Route Sig Dispense Refill  . ASPIRIN 325 MG PO TABS Oral Take 325 mg by mouth daily.    . OMEGA-3 FATTY ACIDS 1000 MG PO CAPS Oral Take 4 g by mouth daily.    [name] LISINOPRIL 40 MG PO TABS Oral Take 1 tablet (40 mg total) by mouth daily. 30 tablet 11  . METOPROLOL SUCCINATE ER 50 MG PO TB24 Oral Take 50 mg by mouth daily.    [name] NITROGLYCERIN 0.4 MG SL SUBL Sublingual Place 0.4 mg under the tongue every 5 (five) minutes as needed. For chest pain    . PRAVASTATIN SODIUM 40 MG PO TABS Oral Take 1 tablet (40 mg total) by mouth every evening. 30 tablet 11    BP 129/78  Pulse 70  Temp(Src) 97.5 F (36.4 C) (Oral)  Resp 16  SpO2 98%  Physical Exam  Constitutional: He is oriented to person, place, and time. He appears well-developed and well-nourished. He appears distressed.  HENT:  Head: Normocephalic and atraumatic.  Eyes: Pupils are equal, round, and reactive to light.  Neck: Normal range of motion. Neck supple.  Cardiovascular: Normal rate, regular rhythm and normal heart sounds.  Exam reveals no gallop and no friction  rub.   No murmur heard. Pulmonary/Chest: Effort normal and breath sounds normal. No respiratory distress. He has no wheezes. He has no rales.  Abdominal: Soft. Bowel sounds are normal. He exhibits no distension. There is no tenderness. There is no rebound and no guarding.  Neurological: He is alert and oriented to person, place, and time.  Skin: Skin is warm and dry. No rash noted. He is not diaphoretic.    ED Course  Procedures (including critical care time)  Labs Reviewed  BASIC METABOLIC PANEL - Abnormal; Notable for the following:    Glucose, Bld 108 (*)    Creatinine, Ser 1.36 (*)    GFR calc non Af Amer 58 (*)    GFR calc Af Amer 68 (*)    All other components within normal limits  CBC - Abnormal; Notable for  the following:    HCT 37.9 (*)    All other components within normal limits  TROPONIN I   Dg Chest Port 1 View  02/25/2011  *RADIOLOGY REPORT*  Clinical Data: Chest pain and shortness of breath.  PORTABLE CHEST - 1 VIEW  Comparison: 07/26/2009.  Findings: The cardiac silhouette, mediastinal and hilar contours are within normal limits and stable.  Stable surgical changes from bypass surgery.  The lungs are clear.  No pleural effusion.  The bony thorax is intact.  IMPRESSION: No acute cardiopulmonary findings.  Original Report Authenticated By: P. Loralie Champagne, M.D.     1. Chest pain     I spoke with [facility] and they will be down to see the patient for admission. The patient has been stable while here.   MDM   Date: 02/25/2011 11:25  Rate: 70  Rhythm: normal sinus rhythm  QRS Axis: normal  Intervals: normal  ST/T Wave abnormalities: nonspecific T wave changes  Conduction Disutrbances:none  Narrative Interpretation: flipped T's in V1-V2  Old EKG Reviewed: changes noted   Date: 02/25/2011  Rate: 79  Rhythm: normal sinus rhythm  QRS Axis: normal  Intervals: normal  ST/T Wave abnormalities: nonspecific T wave changes  Conduction Disutrbances:none  Narrative Interpretation: flipped T's in V1-V2  Old EKG Reviewed: changes noted   MDM Reviewed: previous chart, nursing note and vitals Reviewed previous: labs and ECG Interpretation: labs, ECG and x-ray Consults: cardiology              Carlyle Dolly, PA-C 02/25/11 1634

## 2011-02-25 NOTE — ED Notes (Signed)
Patient remains on monitor and 2L oxygen with sats of 99%. Family at bedside.

## 2011-02-25 NOTE — ED Notes (Signed)
Repeat EKG performed per Otila Kluver, PA and handed to [place], MD

## 2011-02-26 ENCOUNTER — Encounter (HOSPITAL_COMMUNITY)
Admission: EM | Disposition: A | Discharge status: Home / Self Care | Payer: Self-pay | Source: Ambulatory Visit | Attending: Cardiology

## 2011-02-26 ENCOUNTER — Other Ambulatory Visit: Payer: Self-pay

## 2011-02-26 DIAGNOSIS — I2510 Atherosclerotic heart disease of native coronary artery without angina pectoris: Secondary | ICD-10-CM

## 2011-02-26 HISTORY — PX: LEFT HEART CATHETERIZATION WITH CORONARY ANGIOGRAM: SHX5451

## 2011-02-26 LAB — CBC
HCT: 40.6 % (ref 39.0–52.0)
RBC: 4.59 MIL/uL (ref 4.22–5.81)
RDW: 13.7 % (ref 11.5–15.5)
WBC: 9 10*3/uL (ref 4.0–10.5)

## 2011-02-26 LAB — BASIC METABOLIC PANEL
CO2: 29 mEq/L (ref 19–32)
Chloride: 101 mEq/L (ref 96–112)
Creatinine, Ser: 1.29 mg/dL (ref 0.50–1.35)
GFR calc Af Amer: 72 mL/min — ABNORMAL LOW (ref >90)
Potassium: 4.7 mEq/L (ref 3.5–5.1)
Sodium: 137 mEq/L (ref 135–145)

## 2011-02-26 LAB — URINALYSIS, ROUTINE W REFLEX MICROSCOPIC
Glucose, UA: NEGATIVE mg/dL
Hgb urine dipstick: NEGATIVE
Protein, ur: 30 mg/dL — AB
Specific Gravity, Urine: 1.014 (ref 1.005–1.030)
pH: 6 (ref 5.0–8.0)

## 2011-02-26 LAB — PROTIME-INR: INR: 1.03 (ref 0.00–1.49)

## 2011-02-26 LAB — CARDIAC PANEL(CRET KIN+CKTOT+MB+TROPI)
CK, MB: 106.1 ng/mL (ref 0.3–4.0)
Relative Index: 7.4 — ABNORMAL HIGH (ref 0.0–2.5)
Total CK: 1441 U/L — ABNORMAL HIGH (ref 7–232)

## 2011-02-26 LAB — URINE MICROSCOPIC-ADD ON

## 2011-02-26 LAB — HEPARIN LEVEL (UNFRACTIONATED): Heparin Unfractionated: <0.1 IU/mL — ABNORMAL LOW (ref 0.30–0.70)

## 2011-02-26 SURGERY — LEFT HEART CATHETERIZATION WITH CORONARY ANGIOGRAM
Anesthesia: LOCAL

## 2011-02-26 MED ORDER — LIDOCAINE HCL (PF) 1 % IJ SOLN
INTRAMUSCULAR | Status: AC
  Filled 2011-02-26: qty 30

## 2011-02-26 MED ORDER — SODIUM CHLORIDE 0.9 % IV SOLN: 1.0000 mL/kg/h | INTRAVENOUS | Status: AC

## 2011-02-26 MED ORDER — ACETAMINOPHEN 325 MG PO TABS: 650.0000 mg | ORAL_TABLET | ORAL | Status: DC | PRN

## 2011-02-26 MED ORDER — FENTANYL CITRATE 0.05 MG/ML IJ SOLN
INTRAMUSCULAR | Status: AC
  Filled 2011-02-26: qty 2

## 2011-02-26 MED ORDER — CLOPIDOGREL BISULFATE 75 MG PO TABS
75.0000 mg | ORAL_TABLET | Freq: Every day | ORAL | Status: DC
  Administered 2011-02-27 – 2011-02-28 (×2): 75 mg via ORAL
  Filled 2011-02-26 (×2): qty 1

## 2011-02-26 MED ORDER — HEPARIN BOLUS VIA INFUSION
3000.0000 [IU] | Freq: Once | INTRAVENOUS | Status: AC
  Administered 2011-02-26: 3000 [IU] via INTRAVENOUS
  Filled 2011-02-26: qty 3000

## 2011-02-26 MED ORDER — NITROGLYCERIN 0.2 MG/ML ON CALL CATH LAB
INTRAVENOUS | Status: AC
  Filled 2011-02-26: qty 1

## 2011-02-26 MED ORDER — METOPROLOL TARTRATE 25 MG PO TABS: 25.0000 mg | ORAL_TABLET | Freq: Two times a day (BID) | ORAL | Status: DC

## 2011-02-26 MED ORDER — CLOPIDOGREL BISULFATE 75 MG PO TABS
600.0000 mg | ORAL_TABLET | Freq: Once | ORAL | Status: AC
  Administered 2011-02-26: 600 mg via ORAL
  Filled 2011-02-26: qty 8

## 2011-02-26 MED ORDER — MIDAZOLAM HCL 2 MG/2ML IJ SOLN
INTRAMUSCULAR | Status: AC
  Filled 2011-02-26: qty 2

## 2011-02-26 MED ORDER — HEPARIN (PORCINE) IN NACL 2-0.9 UNIT/ML-% IJ SOLN
INTRAMUSCULAR | Status: AC
  Filled 2011-02-26: qty 2000

## 2011-02-26 MED ORDER — ONDANSETRON HCL 4 MG/2ML IJ SOLN: 4.0000 mg | Freq: Four times a day (QID) | INTRAMUSCULAR | Status: DC | PRN

## 2011-02-26 NOTE — Progress Notes (Signed)
CRITICAL VALUE ALERT  Critical value received:  Troponin >25, CKMB 133.8  Date of notification:  02/26/11  Time of notification:  0500  Critical value read back: yes  Nurse who received alert:  D. Buzzy Han, RN  MD notified (1st page):  Dr. Jaquita Rector  Time of first page:  0502  MD notified (2nd page):  Time of second page:  Responding MD:  Dr. Jaquita Rector  Time MD responded:  [phone]

## 2011-02-26 NOTE — Plan of Care (Signed)
Cardiology Fellow X-Cover Note:  50yo with history of CAD s/p CABG presenting with chest pain, elevated troponin consistent with NSTEMI. Physician assistant called regarding Pt elevated troponin overnight and chest pain. Upon evaluation, chest pain had resolved after increase in NTG drip to 20.  Well appearing WM, NAD RRR, no MRG CTAB Soft, NT, ND, +BS No LEE  Plan:  --continue heparin drip, ASA, NTG drip, BB, statin  --will load with 600mg  plavix followed by 75mg  daily   --cardiac catheterization planned for early AM  --will transfer to step down with any recurrence of chest pain  --continued to follow up with patient and nurse overnight  Ailene Ards, MD

## 2011-02-26 NOTE — Interval H&P Note (Signed)
History and Physical Interval Note:  02/26/2011 7:41 AM  Andrew Joyce  has presented today for surgery, with the diagnosis of Chest pain  The various methods of treatment have been discussed with the patient and family. After consideration of risks, benefits and other options for treatment, the patient has consented to  Procedure(s) (LRB): LEFT HEART CATHETERIZATION WITH CORONARY ANGIOGRAM (N/A) as a surgical intervention .  The patients' history has been reviewed, patient examined, no change in status, stable for surgery.  I have reviewed the patients' chart and labs.  Questions were answered to the patient's satisfaction.    Enzymes are c/w NSTEMI.  No ecg changes.  Agree with plans for cath.  Elyn Aquas.

## 2011-02-26 NOTE — Op Note (Signed)
    Cardiac Cath Note  Andrew Joyce [id number] 05-22-1960  Procedure: left  Heart Cardiac Catheterization Note Indications: NSTEMI, hx of CAD  Procedure Details Consent: Obtained Time Out: Verified patient identification, verified procedure, site/side was marked, verified correct patient position, special equipment/implants available, Radiology Safety Procedures followed,  medications/allergies/relevent history reviewed, required imaging and test results available.  Performed   Medications: Fentanyl: 50 mcg IV Versed: 2 mg IV  The right femoral artery was easily canulated using a modified Seldinger technique.  Hemodynamics:   LV pressure: 97/12 Aortic pressure: 93/63  Angiography   Left Main: The left main is smooth and normal.  Left anterior Descending: The left anterior descending artery gives off several large septal branches and is then flush occluded. It gives off a diagonal branch which is fairly unremarkable. The distal left anterior descending artery can be seen filling via left to left collaterals with the native left injections.  Left Circumflex: The left circumflex artery is a large branch.There are moderate irregularities. There is a distal circumflex stenosis of approximately 40-50%. The  posterior lateral segment artery has minor luminal regularities.   Right Coronary Artery: The right coronary artery is occluded in its proximal segment. It reconstitutes and is seen filling via right to right collaterals. It is then occluded again distally. The very distal aspect of the right coronary artery fills via collaterals from injections from the native left.  SVG to distal RCA: Saphenous vein graft to the distal right coronary artery is occluded proximally.  SVG to OM1 and OM 2: Saphenous vein graft to the OM1 and OM2  is occluded proximally. This occlusion is new compared to his previous catheterization in 2011.  LIMA to LAD: The left internal mammary artery is  patent. There are some minor luminal regularities in the native LAD but no obstructive lesions. Flow down to the distal left anterior descending artery is sluggish because of competitive flow from the collateral circulation.  LV Gram: The left ventriculogram was performed in the 30 RAO position. It reveals mildly depressed left ventricle systolic function. There is anterior apical hypokinesis. The overall ejection fraction is fairly well preserved with an EF of 45-50%.      Complications: No apparent complications Patient did tolerate procedure well.  Conclusions:   1. Severe native coronary artery disease 2. Severe graft disease including occlusion of the saphenous vein graft to the right coronary artery and occlusion of the saphenous vein graft to the acute marginal arteries. The LIMA to LAD is patent but there is competitive flow to the distal LAD via collaterals from the left system. 3. Mildly depressed left ventricular systolic function. The overall ejection fraction is fairly well preserved considering the extensive coronary artery disease. EF is approximately 45-50%.  At this point he is 24 hours out from the initiation of this chest pain. His enzymes are elevated. I suspect that the culprit lesion is the graft to the OM 1 and OM 2. At this point I don't think that there would be any advantage in opening up this graft. He is pain-free at present.  We will treat him aggressively. Dr. [place] will review the films in the morning. We'll consider revascularization if he fails medical therapy.  Andrew Joyce, Andrew Hageman., MD, [facility] 02/26/2011, 8:44 AM

## 2011-02-26 NOTE — Progress Notes (Signed)
UR Completed. Simmons, Freja Faro F [phone]  

## 2011-02-26 NOTE — Progress Notes (Signed)
ANTICOAGULATION CONSULT NOTE - Follow Up Consult  Pharmacy Consult for heparin Indication: USAP  Labs:  Basename 02/26/11 0316 02/25/11 1902 02/25/11 1212  HGB 14.2 -- 13.3  HCT 40.6 -- 37.9*  PLT 169 -- 170  APTT -- -- --  LABPROT 13.7 -- --  INR 1.03 -- --  HEPARINUNFRC <0.10* -- --  CREATININE 1.29 -- 1.36*  CKTOTAL 1578* 942* --  CKMB 133.8* 80.6* --  TROPONINI >25.00* 9.56* <0.30   Assessment: 51yo male undetectable on heparin with initial dosing for USAP; scheduled for cath at 0730 this am.  Goal of Therapy:  Heparin level 0.3-0.7 units/ml   Plan:  Will give heparin 3000 units IV bolus x1 and increase gtt by 4 units/kg/hr to 1300 units/hr and f/u after cath.  Colleen Can PharmD BCPS 02/26/2011,4:27 AM

## 2011-02-27 DIAGNOSIS — I10 Essential (primary) hypertension: Secondary | ICD-10-CM

## 2011-02-27 DIAGNOSIS — I214 Non-ST elevation (NSTEMI) myocardial infarction: Principal | ICD-10-CM

## 2011-02-27 DIAGNOSIS — R079 Chest pain, unspecified: Secondary | ICD-10-CM

## 2011-02-27 LAB — CBC
MCH: 31.7 pg (ref 26.0–34.0)
MCHC: 36.4 g/dL — ABNORMAL HIGH (ref 30.0–36.0)
MCV: 87 fL (ref 78.0–100.0)
Platelets: 170 10*3/uL (ref 150–400)

## 2011-02-27 LAB — BASIC METABOLIC PANEL
Calcium: 10 mg/dL (ref 8.4–10.5)
Creatinine, Ser: 1.16 mg/dL (ref 0.50–1.35)
GFR calc non Af Amer: 71 mL/min — ABNORMAL LOW (ref >90)
Glucose, Bld: 116 mg/dL — ABNORMAL HIGH (ref 70–99)
Sodium: 138 mEq/L (ref 135–145)

## 2011-02-27 MED ORDER — OMEGA-3-ACID ETHYL ESTERS 1 G PO CAPS
2.0000 g | ORAL_CAPSULE | Freq: Two times a day (BID) | ORAL | Status: DC
  Administered 2011-02-27 – 2011-02-28 (×2): 2 g via ORAL
  Filled 2011-02-27 (×3): qty 2

## 2011-02-27 NOTE — Progress Notes (Signed)
TELEMETRY: Reviewed telemetry pt in NSR rate 80: Filed Vitals:   02/26/11 1130 02/26/11 1500 02/26/11 2100 02/27/11 0500  BP: 108/72 114/64 126/80 118/77  Pulse: 76 68 86 81  Temp: 99.1 F (37.3 C) 98.7 F (37.1 C) 100.3 F (37.9 C) 98.7 F (37.1 C)  TempSrc: Oral Oral Oral Oral  Resp: 18 18 20 18   Height:      Weight:    79.878 kg (176 lb 1.6 oz)  SpO2: 96%  96% 96%    Intake/Output Summary (Last 24 hours) at 02/27/11 0713 Last data filed at 02/26/11 2100  Gross per 24 hour  Intake    800 ml  Output   1800 ml  Net  -1000 ml    SUBJECTIVE No further chest pain. No dyspnea. Feels well. Denies any fever, dysuria, or flank pain.  LABS: Basic Metabolic Panel:  Basename 02/26/11 0316 02/25/11 1212  NA 137 140  K 4.7 4.4  CL 101 105  CO2 29 29  GLUCOSE 138* 108*  BUN 17 22  CREATININE 1.29 1.36*  CALCIUM 9.5 9.5  MG -- --  PHOS -- --   Liver Function Tests: No results found for this basename: AST:2,ALT:2,ALKPHOS:2,BILITOT:2,PROT:2,ALBUMIN:2 in the last 72 hours No results found for this basename: LIPASE:2,AMYLASE:2 in the last 72 hours CBC:  Basename 02/26/11 0316 02/25/11 1212  WBC 9.0 10.0  NEUTROABS -- --  HGB 14.2 13.3  HCT 40.6 37.9*  MCV 88.5 88.1  PLT 169 170   Cardiac Enzymes:  Basename 02/26/11 1030 02/26/11 0316 02/25/11 1902  CKTOTAL 1441* 1578* 942*  CKMB 106.1* 133.8* 80.6*  CKMBINDEX -- -- --  TROPONINI 17.27* >25.00* 9.56*   BNP: No components found with this basename: POCBNP:3 D-Dimer: No results found for this basename: DDIMER:2 in the last 72 hours Hemoglobin A1C: No results found for this basename: HGBA1C in the last 72 hours Fasting Lipid Panel: No results found for this basename: CHOL,HDL,LDLCALC,TRIG,CHOLHDL,LDLDIRECT in the last 72 hours Thyroid Function Tests: No results found for this basename: TSH,T4TOTAL,FREET3,T3FREE,THYROIDAB in the last 72 hours Anemia Panel: No results found for this basename:  VITAMINB12,FOLATE,FERRITIN,TIBC,IRON,RETICCTPCT in the last 72 hours  Radiology/Studies:  Dg Chest Port 1 View  02/25/2011  *RADIOLOGY REPORT*  Clinical Data: Chest pain and shortness of breath.  PORTABLE CHEST - 1 VIEW  Comparison: 07/26/2009.  Findings: The cardiac silhouette, mediastinal and hilar contours are within normal limits and stable.  Stable surgical changes from bypass surgery.  The lungs are clear.  No pleural effusion.  The bony thorax is intact.  IMPRESSION: No acute cardiopulmonary findings.  Original Report Authenticated By: P. Loralie Champagne, M.D.    PHYSICAL EXAM General: Well developed, well nourished, in no acute distress. Head: Normocephalic, atraumatic, sclera non-icteric, no xanthomas, nares are without discharge. Neck: Negative for carotid bruits. JVD not elevated. Lungs: Clear bilaterally to auscultation without wheezes, rales, or rhonchi. Breathing is unlabored. Heart: RRR S1 S2 without murmurs, rubs, or gallops.  Abdomen: Soft, non-tender, non-distended with normoactive bowel sounds. No hepatomegaly. No rebound/guarding. No obvious abdominal masses. No groin hematoma. Msk:  Strength and tone appears normal for age. Extremities: No clubbing, cyanosis or edema.  Distal pedal pulses are 2+ and equal bilaterally. Neuro: Alert and oriented X 3. Moves all extremities spontaneously. Psych:  Responds to questions appropriately with a normal affect.  ASSESSMENT AND PLAN: 1. NSTEMI secondary to occlusion of SVG to OMs. Chronic occlusion of SG to RCA. RCA fills by left to right collaterals. LIMA patent  but distal LAD occluded and fills by collaterals. Anatomy really not favorable for further revascularization. Will try and optimize medical Rx. 2. HTN well controlled. 3. CKD stage 2 will follow up post cath. Resume ACEi if creatnine stable 4. Combined dyslipidemia. Now on statin and fibrate and fish oil. 5. Chronic UTI. UA shows bacteria and WBCs. Patient reports this is  chronic for him. No active symptoms. Will check culture.   Principal Problem:  *NSTEMI (non-ST elevated myocardial infarction) Active Problems:  CAD (coronary artery disease)  HTN (hypertension)  Hyperlipidemia    Andrew Joyce Bama Hanselman [place] MD,FACC 02/27/2011 7:13 AM

## 2011-02-28 ENCOUNTER — Other Ambulatory Visit: Payer: Self-pay | Admitting: *Deleted

## 2011-02-28 ENCOUNTER — Encounter (HOSPITAL_COMMUNITY): Payer: Self-pay | Admitting: Physician Assistant

## 2011-02-28 DIAGNOSIS — N179 Acute kidney failure, unspecified: Secondary | ICD-10-CM

## 2011-02-28 LAB — BASIC METABOLIC PANEL
CO2: 24 mEq/L (ref 19–32)
Calcium: 10.1 mg/dL (ref 8.4–10.5)
Chloride: 100 mEq/L (ref 96–112)
Glucose, Bld: 132 mg/dL — ABNORMAL HIGH (ref 70–99)
Sodium: 136 mEq/L (ref 135–145)

## 2011-02-28 LAB — CBC
HCT: 40.4 % (ref 39.0–52.0)
Hemoglobin: 14 g/dL (ref 13.0–17.0)
MCH: 30.6 pg (ref 26.0–34.0)
MCHC: 34.7 g/dL (ref 30.0–36.0)
MCV: 88.2 fL (ref 78.0–100.0)
Platelets: 169 K/uL (ref 150–400)
RBC: 4.58 MIL/uL (ref 4.22–5.81)
RDW: 13.5 % (ref 11.5–15.5)
WBC: 8.2 K/uL (ref 4.0–10.5)

## 2011-02-28 MED ORDER — ASPIRIN 81 MG PO TABS: 81.0000 mg | ORAL_TABLET | Freq: Every day | ORAL | Status: DC

## 2011-02-28 MED ORDER — FENOFIBRATE 54 MG PO TABS: 54.0000 mg | ORAL_TABLET | Freq: Every day | ORAL | Status: DC

## 2011-02-28 MED ORDER — CLOPIDOGREL BISULFATE 75 MG PO TABS: 75.0000 mg | ORAL_TABLET | Freq: Every day | ORAL | Status: DC

## 2011-02-28 MED ORDER — OMEGA-3 FATTY ACIDS 1000 MG PO CAPS: 2.0000 g | ORAL_CAPSULE | Freq: Two times a day (BID) | ORAL | Status: DC

## 2011-02-28 NOTE — Progress Notes (Signed)
TELEMETRY: Reviewed telemetry pt in NSR : Filed Vitals:   02/27/11 1355 02/27/11 2100 02/28/11 0500 02/28/11 0700  BP: 116/76 123/79 104/71   Pulse: 77 73 77   Temp: 98.1 F (36.7 C) 98.3 F (36.8 C) 98.1 F (36.7 C)   TempSrc:  Oral Oral   Resp: 20 20 20    Height:      Weight:    79.243 kg (174 lb 11.2 oz)  SpO2: 99% 94% 96%     Intake/Output Summary (Last 24 hours) at 02/28/11 0826 Last data filed at 02/28/11 0500  Gross per 24 hour  Intake    303 ml  Output   1000 ml  Net   -697 ml    SUBJECTIVE No further chest pain. No dyspnea. States he and his wife now have a cold. Denies any fever, dysuria, or flank pain.  LABS: Basic Metabolic Panel:  Basename 02/28/11 0600 02/27/11 1010  NA 136 138  K 3.9 4.0  CL 100 100  CO2 24 28  GLUCOSE 132* 116*  BUN 19 15  CREATININE 1.15 1.16  CALCIUM 10.1 10.0  MG -- --  PHOS -- --   CBC:  Basename 02/28/11 0600 02/27/11 0630  WBC 8.2 8.6  NEUTROABS -- --  HGB 14.0 14.9  HCT 40.4 40.9  MCV 88.2 87.0  PLT 169 170   Cardiac Enzymes:  Basename 02/26/11 1030 02/26/11 0316 02/25/11 1902  CKTOTAL 1441* 1578* 942*  CKMB 106.1* 133.8* 80.6*  CKMBINDEX -- -- --  TROPONINI 17.27* >25.00* 9.56*    Radiology/Studies:  Dg Chest Port 1 View  02/25/2011  *RADIOLOGY REPORT*  Clinical Data: Chest pain and shortness of breath.  PORTABLE CHEST - 1 VIEW  Comparison: 07/26/2009.  Findings: The cardiac silhouette, mediastinal and hilar contours are within normal limits and stable.  Stable surgical changes from bypass surgery.  The lungs are clear.  No pleural effusion.  The bony thorax is intact.  IMPRESSION: No acute cardiopulmonary findings.  Original Report Authenticated By: P. Loralie Champagne, M.D.    PHYSICAL EXAM General: Well developed, well nourished, in no acute distress. Head: Normocephalic, atraumatic, sclera non-icteric, no xanthomas, nares are without discharge. Neck: Negative for carotid bruits. JVD not  elevated. Lungs: Clear bilaterally to auscultation without wheezes, rales, or rhonchi. Breathing is unlabored. Heart: RRR S1 S2 without murmurs, rubs, or gallops.  Abdomen: Soft, non-tender, non-distended with normoactive bowel sounds. No hepatomegaly. No rebound/guarding. No obvious abdominal masses. No groin hematoma. Msk:  Strength and tone appears normal for age. Extremities: No clubbing, cyanosis or edema.  Distal pedal pulses are 2+ and equal bilaterally. Neuro: Alert and oriented X 3. Moves all extremities spontaneously. Psych:  Responds to questions appropriately with a normal affect.  ASSESSMENT AND PLAN: 1. NSTEMI secondary to occlusion of SVG to OMs. Chronic occlusion of SG to RCA. RCA fills by left to right collaterals. LIMA patent but distal LAD occluded and fills by collaterals. Anatomy really not favorable for further revascularization. Will try and optimize medical Rx. 2. HTN well controlled. 3. CKD stage 2. Resume ACEi today. creatnine stable 4. Combined dyslipidemia. Now on statin and fibrate and fish oil. 5. Chronic UTI. UA shows bacteria and WBCs. Patient reports this is chronic for him. No active symptoms. Urine culture pending. I suspect there is just colonization. 6. Disposition. Discharge home today. Follow up in 2 weeks.   Principal Problem:  *NSTEMI (non-ST elevated myocardial infarction) Active Problems:  CAD (coronary artery disease)  HTN (hypertension)  Hyperlipidemia    Signed, Peter [place] MD,FACC 02/28/2011 8:26 AM

## 2011-02-28 NOTE — Discharge Summary (Signed)
Discharge Summary   Patient ID: Andrew Joyce MRN: [id number], DOB/AGE: 1960-03-22 51 y.o. Admit date: 02/25/2011 D/C date:     02/28/2011   Primary Discharge Diagnoses:  1. CAD with NSTEMI this admission secondary to occlusion of SVG to OMs  - cath 2/19 with anatomy not suitable for revascularization - for med rx - hx of CABG 2008 - EF of 45-50% by 02/26/11 2. Acute on chronic kidney disease (Stage 2) 3. Combined dyslipidemia - fenofibrate started this admission so f/u LFTs/lipids recommended in 6 weeks  Secondary Discharge Diagnoses:  1. HTN 2. Chronic UTI 3. Hx of renal stones 4. Hx of congenital bladder extrophy with multiple surgeries as a child  Hospital Course: 51 y/o M with hx of CAD s/p CABG 2008 , HTN presented to [facility] with complaints of chest pain, starting as dull and eventually sharp pain associated with SOB with left arm numbness. The pain was worse with exertion. The chest pain felt like prior MI. In ER, he received ASA, Nitro SL, nitro gtt, morphine with some relief of pain but still had intermittent discomfort. He was admitted to the hospital. EKG showed T wave inversion in V1-2 compare to previous EKG 04/2006. Cr was elevated at 1.8 - he was hydrated and this improved. Troponins increased to >25. He underwent cath 2/19 with Dr. Harvie Bridge notes below:  Conclusions:  1. Severe native coronary artery disease  2. Severe graft disease including occlusion of the saphenous vein graft to the right coronary artery and occlusion of the saphenous vein graft to the acute marginal arteries. The LIMA to LAD is patent but there is competitive flow to the distal LAD via collaterals from the left system.  3. Mildly depressed left ventricular systolic function. The overall ejection fraction is fairly well preserved considering the extensive coronary artery disease. EF is approximately 45-50%.  At this point he is 24 hours out from the initiation of this chest pain. His  enzymes are elevated. I suspect that the culprit lesion is the graft to the OM 1 and OM 2. At this point I don't think that there would be any advantage in opening up this graft. He is pain-free at present. We will treat him aggressively. Dr. Swaziland will review the films in the morning. We'll consider revascularization if he fails medical therapy.  The patient did well post procedurally. UA was checked given hx of UTI and did show bacteria and WBCs. The patient stated this was chronic for him and was felt to represent colonization. He denied active symptoms. Medical therapy was titrated for his CAD. Fenofibrate was added for lipids along with fish oil. His Cr stabilized. Today he is feeling well. The patient was seen and examined today and felt stable for discharge by Dr. Swaziland. Dr. Swaziland would like him to restart his ACEI today.  Discharge Vitals: Blood pressure 104/71, pulse 77, temperature 98.1 F (36.7 C), temperature source Oral, resp. rate 20, height 5\' 10"  (1.778 m), weight 174 lb 11.2 oz (79.243 kg), SpO2 96.00%.  Labs: Lab Results  Component Value Date   WBC 8.2 02/28/2011   HGB 14.0 02/28/2011   HCT 40.4 02/28/2011   MCV 88.2 02/28/2011   PLT 169 02/28/2011     Lab 02/28/11 0600  NA 136  K 3.9  CL 100  CO2 24  BUN 19  CREATININE 1.15  CALCIUM 10.1  PROT --  BILITOT --  ALKPHOS --  ALT --  AST --  GLUCOSE 132*  Basename 02/26/11 1030 02/26/11 0316 02/25/11 1902 02/25/11 1212  CKTOTAL 1441* 1578* 942* --  CKMB 106.1* 133.8* 80.6* --  TROPONINI 17.27* >25.00* 9.56* <0.30   Lab Results  Component Value Date   CHOL 278* 02/06/2011   HDL 36.10* 02/06/2011   TRIG 753.0* 02/06/2011    Diagnostic Studies/Procedures   1. Cardiac catheterization this admission, please see full report and above for summary. 2. Chest Port 1 View 02/25/2011  *RADIOLOGY REPORT*  Clinical Data: Chest pain and shortness of breath.  PORTABLE CHEST - 1 VIEW  Comparison: 07/26/2009.  Findings: The  cardiac silhouette, mediastinal and hilar contours are within normal limits and stable.  Stable surgical changes from bypass surgery.  The lungs are clear.  No pleural effusion.  The bony thorax is intact.  IMPRESSION: No acute cardiopulmonary findings.  Original Report Authenticated By: P. Loralie Champagne, M.D.    Discharge Medications   Medication List  As of 02/28/2011 10:24 AM   TAKE these medications         aspirin 81 MG tablet   Take 1 tablet (81 mg total) by mouth daily.      clopidogrel 75 MG tablet   Commonly known as: PLAVIX   Take 1 tablet (75 mg total) by mouth daily with breakfast.      fenofibrate 54 MG tablet   Take 1 tablet (54 mg total) by mouth daily.      fish oil-omega-3 fatty acids 1000 MG capsule   Take 2 capsules (2 g total) by mouth 2 (two) times daily.      lisinopril 40 MG tablet   Commonly known as: PRINIVIL,ZESTRIL   Take 1 tablet (40 mg total) by mouth daily.      metoprolol succinate 50 MG 24 hr tablet   Commonly known as: TOPROL-XL   Take 50 mg by mouth daily.      nitroGLYCERIN 0.4 MG SL tablet   Commonly known as: NITROSTAT   Place 0.4 mg under the tongue every 5 (five) minutes as needed. For chest pain      pravastatin 40 MG tablet   Commonly known as: PRAVACHOL   Take 1 tablet (40 mg total) by mouth every evening.            Disposition   The patient will be discharged in stable condition to home. Discharge Orders    Future Appointments: Provider: Department: Dept Phone: Center:   03/07/2011 8:45 AM Rosalio Macadamia, NP [facility] [phone] None   03/07/2011 11:20 AM [facility] [phone] [facility]     Future Orders Please Complete By Expires   Diet - low sodium heart healthy      Increase activity slowly      Comments:   No driving for 1 week. No lifting over 5 lbs for 1 week. Talk to your cardiology provider at your follow-up appointment before returning to heavy physical activity at the  gym. No sexual activity for 1 week. Keep procedure site clean & dry. If you notice increased pain, swelling, bleeding or pus, call/return!  You may shower, but no soaking baths/hot tubs/pools for 1 week.       Follow-up Information    Follow up with Norma Fredrickson, NP. (At [facility] on 03/07/11 at 8:45am)    Contact information:   [address], [address] [place] [address] [phone]            Duration of Discharge Encounter: Greater than 30  minutes including physician and PA time.  Signed, Ronie Spies PA-C 02/28/2011, 10:24 AM

## 2011-02-28 NOTE — Discharge Summary (Signed)
Patient seen and examined and history reviewed. Agree with above findings and plan.See rounding note from earlier today.   Andrew Joyce 02/28/2011 10:58 AM

## 2011-03-01 LAB — URINE CULTURE

## 2011-03-07 ENCOUNTER — Ambulatory Visit (INDEPENDENT_AMBULATORY_CARE_PROVIDER_SITE_OTHER): Payer: Self-pay | Admitting: Nurse Practitioner

## 2011-03-07 ENCOUNTER — Encounter: Payer: Self-pay | Admitting: Nurse Practitioner

## 2011-03-07 ENCOUNTER — Other Ambulatory Visit (INDEPENDENT_AMBULATORY_CARE_PROVIDER_SITE_OTHER): Payer: Self-pay

## 2011-03-07 VITALS — BP 120/90 | HR 76 | Ht 70.0 in | Wt 174.2 lb

## 2011-03-07 DIAGNOSIS — I214 Non-ST elevation (NSTEMI) myocardial infarction: Secondary | ICD-10-CM

## 2011-03-07 DIAGNOSIS — N179 Acute kidney failure, unspecified: Secondary | ICD-10-CM

## 2011-03-07 DIAGNOSIS — E785 Hyperlipidemia, unspecified: Secondary | ICD-10-CM

## 2011-03-07 DIAGNOSIS — I10 Essential (primary) hypertension: Secondary | ICD-10-CM

## 2011-03-07 LAB — BASIC METABOLIC PANEL
BUN: 29 mg/dL — ABNORMAL HIGH (ref 6–23)
CO2: 27 mEq/L (ref 19–32)
Calcium: 9.3 mg/dL (ref 8.4–10.5)
Chloride: 102 mEq/L (ref 96–112)
Creatinine, Ser: 1.7 mg/dL — ABNORMAL HIGH (ref 0.4–1.5)
GFR: 45.1 mL/min — ABNORMAL LOW (ref >60.00)
Glucose, Bld: 110 mg/dL — ABNORMAL HIGH (ref 70–99)
Potassium: 4.2 mEq/L (ref 3.5–5.1)
Sodium: 138 mEq/L (ref 135–145)

## 2011-03-07 MED ORDER — METOPROLOL TARTRATE 50 MG PO TABS: 50.0000 mg | ORAL_TABLET | Freq: Two times a day (BID) | ORAL | Status: DC

## 2011-03-07 NOTE — Patient Instructions (Signed)
You may start walking. Start slow and increase as tolerated.  Stay on your current medicines. But we are going to change the Metoprolol succinate to Lopressor 50 mg two times a day. This prescription is at the drug store.  We are checking your labs today.  We will see you in a month.  Call the [facility] office at [phone] if you have any questions, problems or concerns.

## 2011-03-07 NOTE — Assessment & Plan Note (Signed)
Blood pressure is fair. He is out of his Metoprolol. We will switch him to Lopressor 50 mg BID. This will help with his cost. I will see him back in a month. Patient is agreeable to this plan and will call if any problems develop in the interim.

## 2011-03-07 NOTE — Assessment & Plan Note (Signed)
On fibrate and statin therapy.

## 2011-03-07 NOTE — Progress Notes (Signed)
Andrew Joyce Date of Birth: April 07, 1960 Medical Record #[id number]  History of Present Illness: Andrew Joyce is seen back today for a post hospital visit. He is seen for Andrew Joyce. He has had recent NSTEMI. This is secondary to occlusion of the SVG to OM. His anatomy is not suitable for revascularization and he is for medical management.   He has been home for about a week. He is doing well. No recurrent chest pain. No NTG use. He has run out of his metoprolol. Cost is going to be an issue. He is not working and has no insurance. He is wondering about disability. His EF is 45 to 50%. Medical therapy is his only option. He wants to start walking a little. He is not lightheaded or dizzy. He is tolerating his medicines. He is on chronic Plavix now.  Current Outpatient Prescriptions on File Prior to Visit  Medication Sig Dispense Refill  . aspirin 81 MG tablet Take 1 tablet (81 mg total) by mouth daily.      . clopidogrel (PLAVIX) 75 MG tablet Take 1 tablet (75 mg total) by mouth daily with breakfast.  30 tablet  6  . fenofibrate 54 MG tablet Take 1 tablet (54 mg total) by mouth daily.  30 tablet  6  . fish oil-omega-3 fatty acids 1000 MG capsule Take 2 capsules (2 g total) by mouth 2 (two) times daily.      [name] lisinopril (PRINIVIL,ZESTRIL) 40 MG tablet Take 1 tablet (40 mg total) by mouth daily.  30 tablet  11  . nitroGLYCERIN (NITROSTAT) 0.4 MG SL tablet Place 0.4 mg under the tongue every 5 (five) minutes as needed. For chest pain      . pravastatin (PRAVACHOL) 40 MG tablet Take 1 tablet (40 mg total) by mouth every evening.  30 tablet  11  . DISCONTD: nitroGLYCERIN (NITROSTAT) 0.4 MG SL tablet Place 1 tablet (0.4 mg total) under the tongue every 5 (five) minutes as needed for chest pain.  25 tablet  12    No Known Allergies  Past Medical History  Diagnosis Date  . Coronary artery disease     s/p CABG in 2008; s/p cath in 2011 for med rx. Cath 02/2011 in setting of NSTEMI secondary  to occlusion of SVG-OMs, treated medically with anatomy not suitable for revasculization. EF 45-50% by cath 02/2011.  [name] Dyslipidemia   . Hypertension   . Recurrent UTI   . History of renal stone   . Bladder exstrophy      Congenital- had multiple surgeries as a child  . Chronic kidney disease (CKD), stage II (mild)     Past Surgical History  Procedure Date  . Cardiac catheterization 07/28/2009    EF 60%; Grafts patent except for SVG to the AM and PD. Native RCA is occluded.  . Cardiac catheterization 03/20/2006    EF 60-65%  . Coronary artery bypass graft 03/2006    LIMA GRAFT TO LAD, SAPHENOUS VEIN GRAFT TO THE ACUTE MARGINAL BRANCH THE RIGHT CORONARY, SAPHENOUS VEIN GRAFT TO THE PDA, SEQUENTIAL VEIN GRAFT TO THE FIRST AND SECOND OBTUSE MARGINAL VESSELS  . Cardiovascular stress test 06/12/2009    EF 71%, SMALL AREA OF INFARCT/ISCHEMIA IN INFERIOR WALL; FELT TO NOT BE CHANGED.    History  Smoking status  . Never Smoker   Smokeless tobacco  . Not on file    History  Alcohol Use No    Family History  Problem Relation Age of Onset  .  Hyperlipidemia Mother   . Hypertension Mother   . Hyperlipidemia Father   . Hypertension Father     Review of Systems: The review of systems is per the HPI.  All other systems were reviewed and are negative.  Physical Exam: BP 120/90  Pulse 76  Ht 5\' 10"  (1.778 m)  Wt 174 lb 3.2 oz (79.017 kg)  BMI 25.00 kg/m2 Patient is very pleasant and in no acute distress. Skin is warm and dry. Color is normal.  HEENT is unremarkable. Normocephalic/atraumatic. PERRL. Sclera are nonicteric. Neck is supple. No masses. No JVD. Lungs are clear. Cardiac exam shows a regular rate and rhythm. Soft S4 noted. Abdomen is soft. Extremities are without edema. Gait and ROM are intact. No gross neurologic deficits noted.   LABORATORY DATA: PENDING   Assessment / Plan:

## 2011-03-07 NOTE — Assessment & Plan Note (Signed)
He has had recent NSTEMI. Medical therapy is felt to be his only option. He is currently doing well and is asymptomatic. Samples of Plavix was given. I have increased his activity. We will see him back in 4 weeks. We will recheck his BMET today. He is in the process of applying for Medicaid. We discussed long term disability. I do suspect that he is going to have some limitation. Patient is agreeable to this plan and will call if any problems develop in the interim.

## 2011-03-08 ENCOUNTER — Other Ambulatory Visit: Payer: Self-pay

## 2011-03-08 ENCOUNTER — Telehealth: Payer: Self-pay | Admitting: Cardiology

## 2011-03-08 DIAGNOSIS — I10 Essential (primary) hypertension: Secondary | ICD-10-CM

## 2011-03-08 MED ORDER — LISINOPRIL 20 MG PO TABS: 20.0000 mg | ORAL_TABLET | Freq: Every day | ORAL | Status: DC

## 2011-03-08 NOTE — Telephone Encounter (Signed)
Pt now at [facility], seeing dr assimos , last saw dr Retta Diones in 2011 , dr Swaziland called yesterday to dr Retta Diones re urological problem, suggested he call dr Teofilo Pod

## 2011-03-08 NOTE — Telephone Encounter (Signed)
Spoke to Dr.Dahstedt's nurse Victorino Dike, she stated patient does not see Dr.Dahlstedt anymore,last office visit 03/2009. Stated his cared was transferred to Dr.Assimos at [facility] was called and told, he stated he does still see Dr.Dalstedt.Advised he needs to be treated for positive urine culture that was done in hospital recently.Patient stated he would call Dr.Dalstedt's office.

## 2011-03-22 ENCOUNTER — Other Ambulatory Visit (INDEPENDENT_AMBULATORY_CARE_PROVIDER_SITE_OTHER): Payer: Self-pay

## 2011-03-22 DIAGNOSIS — I10 Essential (primary) hypertension: Secondary | ICD-10-CM

## 2011-03-22 LAB — BASIC METABOLIC PANEL
BUN: 30 mg/dL — ABNORMAL HIGH (ref 6–23)
CO2: 29 mEq/L (ref 19–32)
Calcium: 9.5 mg/dL (ref 8.4–10.5)
Chloride: 105 mEq/L (ref 96–112)
Creatinine, Ser: 1.7 mg/dL — ABNORMAL HIGH (ref 0.4–1.5)
GFR: 44.79 mL/min — ABNORMAL LOW (ref >60.00)
Glucose, Bld: 117 mg/dL — ABNORMAL HIGH (ref 70–99)
Potassium: 4.4 mEq/L (ref 3.5–5.1)
Sodium: 139 mEq/L (ref 135–145)

## 2011-04-04 ENCOUNTER — Ambulatory Visit: Payer: Self-pay | Admitting: Nurse Practitioner

## 2011-04-08 ENCOUNTER — Ambulatory Visit (INDEPENDENT_AMBULATORY_CARE_PROVIDER_SITE_OTHER): Payer: Self-pay | Admitting: Nurse Practitioner

## 2011-04-08 ENCOUNTER — Encounter: Payer: Self-pay | Admitting: Nurse Practitioner

## 2011-04-08 VITALS — BP 118/82 | HR 58 | Ht 70.0 in | Wt 176.0 lb

## 2011-04-08 DIAGNOSIS — E785 Hyperlipidemia, unspecified: Secondary | ICD-10-CM

## 2011-04-08 DIAGNOSIS — I2510 Atherosclerotic heart disease of native coronary artery without angina pectoris: Secondary | ICD-10-CM

## 2011-04-08 LAB — HEPATIC FUNCTION PANEL
ALT: 27 U/L (ref 0–53)
AST: 26 U/L (ref 0–37)
Albumin: 4.2 g/dL (ref 3.5–5.2)
Alkaline Phosphatase: 48 U/L (ref 39–117)
Bilirubin, Direct: 0 mg/dL (ref 0.0–0.3)
Total Bilirubin: 0.2 mg/dL — ABNORMAL LOW (ref 0.3–1.2)
Total Protein: 7.2 g/dL (ref 6.0–8.3)

## 2011-04-08 LAB — BASIC METABOLIC PANEL
BUN: 35 mg/dL — ABNORMAL HIGH (ref 6–23)
CO2: 26 mEq/L (ref 19–32)
Calcium: 9.1 mg/dL (ref 8.4–10.5)
Chloride: 106 mEq/L (ref 96–112)
Creatinine, Ser: 1.6 mg/dL — ABNORMAL HIGH (ref 0.4–1.5)
GFR: 47.33 mL/min — ABNORMAL LOW (ref >60.00)
Glucose, Bld: 149 mg/dL — ABNORMAL HIGH (ref 70–99)
Potassium: 4.1 mEq/L (ref 3.5–5.1)
Sodium: 140 mEq/L (ref 135–145)

## 2011-04-08 LAB — LIPID PANEL
Cholesterol: 194 mg/dL (ref 0–200)
HDL: 34.5 mg/dL — ABNORMAL LOW (ref >39.00)
Total CHOL/HDL Ratio: 6
Triglycerides: 493 mg/dL — ABNORMAL HIGH (ref 0.0–149.0)
VLDL: 98.6 mg/dL — ABNORMAL HIGH (ref 0.0–40.0)

## 2011-04-08 LAB — LDL CHOLESTEROL, DIRECT: Direct LDL: 86.5 mg/dL

## 2011-04-08 NOTE — Assessment & Plan Note (Signed)
He has had recent NSTEMI and medical therapy is felt to be his only option. I think he is currently doing ok. He has used NTG only once and it sounds like that wasn't like his prior angina. He has no symptoms with exertion. I have left him on his current regimen. We will see him back in about 2 1/2 months. Patient is agreeable to this plan and will call if any problems develop in the interim.

## 2011-04-08 NOTE — Patient Instructions (Signed)
I think you are doing well over all.  Lets check your labs today.  I will have you see Dr. Swaziland in about 2 1/2 months.  Call the [facility] office at [phone] if you have any questions, problems or concerns.

## 2011-04-08 NOTE — Assessment & Plan Note (Signed)
His last lipids were not good. Triglycerides over 700. He is fasting today and we will recheck his labs. He does eat a lot of "white" colored foods and we discussed how to make some dietary changes. He will continue with his fenofibrate and statin therapy.

## 2011-04-08 NOTE — Progress Notes (Signed)
[name] Date of Birth: September 13, 1960 Medical Record #[id number]  History of Present Illness: [name] is seen back today for a one month check. He is seen for Dr. Swaziland. He has had a recent NSTEMI back in February. This was secondary to an occlusion of the SVG to the OM. His anatomy is not suitable for revascularization and he will be managed medically. EF is 45 to 50%. He is on chronic Plavix. He is currently not working and has no insurance. Cost of his medicines is going to be a long term issue. At his last visit, he had run out of his Toprol and we switched him over to metoprolol tartrate BID.  He comes in today. He is here alone. He is doing ok. No chest pain. Not short of breath. Tolerating his medicines. He did have one episode almost a week ago while watching TV. He felt a sharp pain. Sharper than what his usual chest pain feels like. He did take a NTG and felt fine about 20 minutes later. He has not had recurrence. He has no exertional symptoms. He does get a little dizzy if he stands up too quickly. He is walking daily and does fine.   Current Outpatient Prescriptions on File Prior to Visit  Medication Sig Dispense Refill  . aspirin 81 MG tablet Take 1 tablet (81 mg total) by mouth daily.      . clopidogrel (PLAVIX) 75 MG tablet Take 1 tablet (75 mg total) by mouth daily with breakfast.  30 tablet  6  . fenofibrate 54 MG tablet Take 1 tablet (54 mg total) by mouth daily.  30 tablet  6  . fish oil-omega-3 fatty acids 1000 MG capsule Take 2 capsules (2 g total) by mouth 2 (two) times daily.      [name] lisinopril (PRINIVIL,ZESTRIL) 20 MG tablet Take 1 tablet (20 mg total) by mouth daily.  30 tablet  11  . metoprolol (LOPRESSOR) 50 MG tablet Take 1 tablet (50 mg total) by mouth 2 (two) times daily.  60 tablet  11  . nitroGLYCERIN (NITROSTAT) 0.4 MG SL tablet Place 0.4 mg under the tongue every 5 (five) minutes as needed. For chest pain      . pravastatin (PRAVACHOL) 40 MG  tablet Take 1 tablet (40 mg total) by mouth every evening.  30 tablet  11  . DISCONTD: nitroGLYCERIN (NITROSTAT) 0.4 MG SL tablet Place 1 tablet (0.4 mg total) under the tongue every 5 (five) minutes as needed for chest pain.  25 tablet  12    No Known Allergies  Past Medical History  Diagnosis Date  . Coronary artery disease     s/p CABG in 2008; s/p cath in 2011 for med rx. Cath 02/2011 in setting of NSTEMI secondary to occlusion of SVG-OMs, treated medically with anatomy not suitable for revasculization. EF 45-50% by cath 02/2011.  [name] Dyslipidemia   . Hypertension   . Recurrent UTI   . History of renal stone   . Bladder exstrophy      Congenital- had multiple surgeries as a child  . Chronic kidney disease (CKD), stage II (mild)     Past Surgical History  Procedure Date  . Cardiac catheterization 07/28/2009    EF 60%; Grafts patent except for SVG to the AM and PD. Native RCA is occluded.  . Cardiac catheterization 03/20/2006    EF 60-65%  . Coronary artery bypass graft 03/2006    LIMA GRAFT TO LAD, SAPHENOUS VEIN GRAFT  TO THE ACUTE MARGINAL BRANCH THE RIGHT CORONARY, SAPHENOUS VEIN GRAFT TO THE PDA, SEQUENTIAL VEIN GRAFT TO THE FIRST AND SECOND OBTUSE MARGINAL VESSELS  . Cardiovascular stress test 06/12/2009    EF 71%, SMALL AREA OF INFARCT/ISCHEMIA IN INFERIOR WALL; FELT TO NOT BE CHANGED.    History  Smoking status  . Never Smoker   Smokeless tobacco  . Not on file    History  Alcohol Use No    Family History  Problem Relation Age of Onset  . Hyperlipidemia Mother   . Hypertension Mother   . Hyperlipidemia Father   . Hypertension Father     Review of Systems: The review of systems is per the HPI.  He did have some issue with a positive urine culture when hospitalized. He was told to follow up with GU. He has been seen by GU here in [place] and at [facility]. He has a chronic bladder drain in place. He says that he always has a chronic UTI. He gets treated if he has  associated chills and fever. He is currently without fever or chills.  All other systems were reviewed and are negative.  Physical Exam: BP 118/82  Pulse 58  Ht 5\' 10"  (1.778 m)  Wt 176 lb (79.833 kg)  BMI 25.25 kg/m2 Patient is very pleasant and in no acute distress. Skin is warm and dry. Color is normal.  HEENT is unremarkable. Normocephalic/atraumatic. PERRL. Sclera are nonicteric. Neck is supple. No masses. No JVD. Lungs are clear. Cardiac exam shows a regular rate and rhythm. Abdomen is soft. Extremities are without edema. Gait and ROM are intact. No gross neurologic deficits noted.   Lab Results  Component Value Date   WBC 8.2 02/28/2011   HGB 14.0 02/28/2011   HCT 40.4 02/28/2011   PLT 169 02/28/2011   GLUCOSE 117* 03/22/2011   CHOL 278* 02/06/2011   TRIG 753.0* 02/06/2011   HDL 36.10* 02/06/2011   LDLDIRECT 104.5 02/06/2011   ALT 29 02/06/2011   AST 30 02/06/2011   NA 139 03/22/2011   K 4.4 03/22/2011   CL 105 03/22/2011   CREATININE 1.7* 03/22/2011   BUN 30* 03/22/2011   CO2 29 03/22/2011   INR 1.03 02/26/2011    Assessment / Plan:

## 2011-05-01 ENCOUNTER — Telehealth: Payer: Self-pay | Admitting: Nurse Practitioner

## 2011-05-01 NOTE — Telephone Encounter (Signed)
Pt dropped off FMLA payment and release of information.  Will forward to [employer] on next courier run.

## 2011-06-13 ENCOUNTER — Encounter: Payer: Self-pay | Admitting: Cardiology

## 2011-06-13 ENCOUNTER — Ambulatory Visit (INDEPENDENT_AMBULATORY_CARE_PROVIDER_SITE_OTHER): Payer: Self-pay | Admitting: Cardiology

## 2011-06-13 VITALS — BP 122/88 | HR 60 | Ht 70.0 in | Wt 172.4 lb

## 2011-06-13 DIAGNOSIS — R0600 Dyspnea, unspecified: Secondary | ICD-10-CM

## 2011-06-13 DIAGNOSIS — I10 Essential (primary) hypertension: Secondary | ICD-10-CM

## 2011-06-13 DIAGNOSIS — R0989 Other specified symptoms and signs involving the circulatory and respiratory systems: Secondary | ICD-10-CM

## 2011-06-13 DIAGNOSIS — R05 Cough: Secondary | ICD-10-CM

## 2011-06-13 DIAGNOSIS — E785 Hyperlipidemia, unspecified: Secondary | ICD-10-CM

## 2011-06-13 DIAGNOSIS — I2510 Atherosclerotic heart disease of native coronary artery without angina pectoris: Secondary | ICD-10-CM

## 2011-06-13 DIAGNOSIS — E782 Mixed hyperlipidemia: Secondary | ICD-10-CM

## 2011-06-13 MED ORDER — LOSARTAN POTASSIUM 100 MG PO TABS: 100.0000 mg | ORAL_TABLET | Freq: Every day | ORAL | Status: DC

## 2011-06-13 NOTE — Progress Notes (Signed)
Andrew Joyce Date of Birth: 02-13-1960 Medical Record #[id number]  History of Present Illness: Andrew Joyce is seen for followup today. He reports he has not been doing well. He complains that his shortness of breath is increasing. 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 This lasted a few minutes and then resolved. His blood pressure at home still fluctuates anywhere from 120 systolic to 160 systolic. One month ago he reports that he had pneumonia. He was seen at urgent care and prescribed antibiotics. He did not have a chest x-ray done. He still has a cough that is somewhat productive. He has an occasional stinging sensation in his left parasternal region.  Current Outpatient Prescriptions on File Prior to Visit  Medication Sig Dispense Refill  . aspirin 81 MG tablet Take 1 tablet (81 mg total) by mouth daily.      . clopidogrel (PLAVIX) 75 MG tablet Take 1 tablet (75 mg total) by mouth daily with breakfast.  30 tablet  6  . fenofibrate 54 MG tablet Take 1 tablet (54 mg total) by mouth daily.  30 tablet  6  . fish oil-omega-3 fatty acids 1000 MG capsule Take 2 capsules (2 g total) by mouth 2 (two) times daily.      . metoprolol (LOPRESSOR) 50 MG tablet Take 1 tablet (50 mg total) by mouth 2 (two) times daily.  60 tablet  11  . nitroGLYCERIN (NITROSTAT) 0.4 MG SL tablet Place 0.4 mg under the tongue every 5 (five) minutes as needed. For chest pain      . pravastatin (PRAVACHOL) 40 MG tablet Take 1 tablet (40 mg total) by mouth every evening.  30 tablet  11  . losartan (COZAAR) 100 MG tablet Take 1 tablet (100 mg total) by mouth daily.  30 tablet  11  . DISCONTD: nitroGLYCERIN (NITROSTAT) 0.4 MG SL tablet Place 1 tablet (0.4 mg total) under the tongue every 5 (five) minutes as needed for chest pain.  25 tablet  12    No Known Allergies  Past Medical History  Diagnosis Date  . Coronary artery disease     s/p  CABG in 2008; s/p cath in 2011 for med rx. Cath 02/2011 in setting of NSTEMI secondary to occlusion of SVG-OMs, treated medically with anatomy not suitable for revasculization. EF 45-50% by cath 02/2011.  [name] Dyslipidemia   . Hypertension   . Recurrent UTI   . History of renal stone   . Bladder exstrophy      Congenital- had multiple surgeries as a child  . Chronic kidney disease (CKD), stage II (mild)     Past Surgical History  Procedure Date  . Cardiac catheterization 07/28/2009    EF 60%; Grafts patent except for SVG to the AM and PD. Native RCA is occluded.  . Cardiac catheterization 03/20/2006    EF 60-65%  . Coronary artery bypass graft 03/2006    LIMA GRAFT TO LAD, SAPHENOUS VEIN GRAFT TO THE ACUTE MARGINAL BRANCH THE RIGHT CORONARY, SAPHENOUS VEIN GRAFT TO THE PDA, SEQUENTIAL VEIN GRAFT TO THE FIRST AND SECOND OBTUSE MARGINAL VESSELS  . Cardiovascular stress test 06/12/2009    EF 71%, SMALL AREA OF INFARCT/ISCHEMIA IN INFERIOR WALL; FELT TO NOT BE CHANGED.    History  Smoking status  . Never Smoker   Smokeless tobacco  . Not on file    History  Alcohol Use No    Family  History  Problem Relation Age of Onset  . Hyperlipidemia Mother   . Hypertension Mother   . Hyperlipidemia Father   . Hypertension Father     Review of Systems: The review of systems is per the HPI.   He has a chronic bladder drain in place. He says that he always has a chronic UTI.  All other systems were reviewed and are negative.  Physical Exam: BP 122/88  Pulse 60  Ht 5\' 10"  (1.778 m)  Wt 172 lb 6.4 oz (78.2 kg)  BMI 24.74 kg/m2 Patient is  pleasant and in no acute distress. Skin is warm and dry. Color is normal.  HEENT is unremarkable. Normocephalic/atraumatic. PERRL. Sclera are nonicteric. Neck is supple. No masses. No JVD. Lungs are clear. Cardiac exam shows a regular rate and rhythm. No gallop, murmur, or click. Abdomen is soft. Extremities are without edema. Gait and ROM are intact. No gross  neurologic deficits noted.  Laboratory data: Lab Results  Component Value Date   CHOL 194 04/08/2011   HDL 34.50* 04/08/2011   LDLDIRECT 86.5 04/08/2011   TRIG 493.0* 04/08/2011   CHOLHDL 6 04/08/2011      Assessment / Plan:

## 2011-06-13 NOTE — Assessment & Plan Note (Signed)
His dyspnea is worse since he contracted his upper respiratory infection. It is not clear that this was pneumonia since he did not have a chest x-ray. He still has a persistent cough. This may be exacerbated by his ACE inhibitor. I recommended stopping lisinopril and we will switch him to losartan 100 mg daily. If his symptoms of cough and dyspnea do not improve we will consider repeating a chest x-ray. We will check a BNP level with his next lab work.

## 2011-06-13 NOTE — Assessment & Plan Note (Signed)
Blood pressure appears to be under excellent control today. We will continue his metoprolol and switch his lisinopril to losartan.

## 2011-06-13 NOTE — Assessment & Plan Note (Signed)
He is status post CABG. His last cardiac catheterization showed occluded native LAD and right coronary. The saphenous vein graft to the right coronary and obtuse marginal vessel was occluded. The LIMA graft to the LAD was patent. There is not felt to be any option for intervention. He is really not having any significant angina at this time. Continue medical management.

## 2011-06-13 NOTE — Assessment & Plan Note (Signed)
His triglycerides are significantly elevated but improved from before. We will continue with omega-3 4 g daily and fenofibrate adjusted for renal clearance. Continue to focus on dietary modifications.

## 2011-06-13 NOTE — Patient Instructions (Signed)
Stop lisinopril.  Start losartan 100 mg daily instead.  Continue your other medication.  If your shortness of breath and cough persists let me know.  I will see you again in 3 months with lab work.

## 2011-09-18 ENCOUNTER — Encounter: Payer: Self-pay | Admitting: Cardiology

## 2011-09-18 ENCOUNTER — Other Ambulatory Visit: Payer: Self-pay

## 2011-09-18 ENCOUNTER — Other Ambulatory Visit (INDEPENDENT_AMBULATORY_CARE_PROVIDER_SITE_OTHER): Payer: Self-pay

## 2011-09-18 ENCOUNTER — Ambulatory Visit (INDEPENDENT_AMBULATORY_CARE_PROVIDER_SITE_OTHER): Payer: Self-pay | Admitting: Cardiology

## 2011-09-18 VITALS — BP 132/84 | HR 69 | Ht 70.0 in | Wt 174.1 lb

## 2011-09-18 DIAGNOSIS — I209 Angina pectoris, unspecified: Secondary | ICD-10-CM

## 2011-09-18 DIAGNOSIS — R0602 Shortness of breath: Secondary | ICD-10-CM

## 2011-09-18 DIAGNOSIS — E785 Hyperlipidemia, unspecified: Secondary | ICD-10-CM

## 2011-09-18 DIAGNOSIS — R0989 Other specified symptoms and signs involving the circulatory and respiratory systems: Secondary | ICD-10-CM

## 2011-09-18 DIAGNOSIS — I2510 Atherosclerotic heart disease of native coronary artery without angina pectoris: Secondary | ICD-10-CM

## 2011-09-18 DIAGNOSIS — I10 Essential (primary) hypertension: Secondary | ICD-10-CM

## 2011-09-18 DIAGNOSIS — R0600 Dyspnea, unspecified: Secondary | ICD-10-CM

## 2011-09-18 LAB — CBC WITH DIFFERENTIAL/PLATELET
Basophils Absolute: 0 10*3/uL (ref 0.0–0.1)
Eosinophils Relative: 2.5 % (ref 0.0–5.0)
HCT: 41.3 % (ref 39.0–52.0)
Hemoglobin: 13.9 g/dL (ref 13.0–17.0)
Lymphocytes Relative: 34.6 % (ref 12.0–46.0)
Monocytes Relative: 10.3 % (ref 3.0–12.0)
Platelets: 215 10*3/uL (ref 150.0–400.0)
RDW: 13.4 % (ref 11.5–14.6)
WBC: 4.6 10*3/uL (ref 4.5–10.5)

## 2011-09-18 MED ORDER — ISOSORBIDE MONONITRATE ER 60 MG PO TB24: 60.0000 mg | ORAL_TABLET | Freq: Every day | ORAL | Status: DC

## 2011-09-18 NOTE — Progress Notes (Addendum)
Andrew Joyce Date of Birth: 1960-02-23 Medical Record #[id number]  History of Present Illness: [name] is seen for followup today. He continues to complain of shortness of breath with any activity. This has really limited his ability to exercise. He has used nitroglycerin sublingual twice a week. He denies any orthopnea or PND. His cough is better since he was switched from lisinopril to Cozaar. He has a localized pain in the left breast region that is a constant ache. This is worse with deep breathing. Sometimes when he is short of breath the pain becomes very sharp and stings. This is when he takes nitroglycerin. His blood pressure is under good control.  Current Outpatient Prescriptions on File Prior to Visit  Medication Sig Dispense Refill  . aspirin 81 MG tablet Take 1 tablet (81 mg total) by mouth daily.      . clopidogrel (PLAVIX) 75 MG tablet Take 1 tablet (75 mg total) by mouth daily with breakfast.  30 tablet  6  . fenofibrate 54 MG tablet Take 1 tablet (54 mg total) by mouth daily.  30 tablet  6  . fish oil-omega-3 fatty acids 1000 MG capsule Take 2 capsules (2 g total) by mouth 2 (two) times daily.      [name] losartan (COZAAR) 100 MG tablet Take 1 tablet (100 mg total) by mouth daily.  30 tablet  11  . metoprolol (LOPRESSOR) 50 MG tablet Take 1 tablet (50 mg total) by mouth 2 (two) times daily.  60 tablet  11  . nitroGLYCERIN (NITROSTAT) 0.4 MG SL tablet Place 0.4 mg under the tongue every 5 (five) minutes as needed. For chest pain      . pravastatin (PRAVACHOL) 40 MG tablet Take 1 tablet (40 mg total) by mouth every evening.  30 tablet  11  . isosorbide mononitrate (IMDUR) 60 MG 24 hr tablet Take 1 tablet (60 mg total) by mouth daily.  30 tablet  11  . DISCONTD: nitroGLYCERIN (NITROSTAT) 0.4 MG SL tablet Place 1 tablet (0.4 mg total) under the tongue every 5 (five) minutes as needed for chest pain.  25 tablet  12    No Known Allergies  Past Medical History  Diagnosis  Date  . Coronary artery disease     s/p CABG in 2008; s/p cath in 2011 for med rx. Cath 02/2011 in setting of NSTEMI secondary to occlusion of SVG-OMs, treated medically with anatomy not suitable for revasculization. EF 45-50% by cath 02/2011.  [name] Dyslipidemia   . Hypertension   . Recurrent UTI   . History of renal stone   . Bladder exstrophy      Congenital- had multiple surgeries as a child  . Chronic kidney disease (CKD), stage II (mild)     Past Surgical History  Procedure Date  . Cardiac catheterization 07/28/2009    EF 60%; Grafts patent except for SVG to the AM and PD. Native RCA is occluded.  . Cardiac catheterization 03/20/2006    EF 60-65%  . Coronary artery bypass graft 03/2006    LIMA GRAFT TO LAD, SAPHENOUS VEIN GRAFT TO THE ACUTE MARGINAL BRANCH THE RIGHT CORONARY, SAPHENOUS VEIN GRAFT TO THE PDA, SEQUENTIAL VEIN GRAFT TO THE FIRST AND SECOND OBTUSE MARGINAL VESSELS  . Cardiovascular stress test 06/12/2009    EF 71%, SMALL AREA OF INFARCT/ISCHEMIA IN INFERIOR WALL; FELT TO NOT BE CHANGED.    History  Smoking status  . Never Smoker   Smokeless tobacco  . Not on file  History  Alcohol Use No    Family History  Problem Relation Age of Onset  . Hyperlipidemia Mother   . Hypertension Mother   . Hyperlipidemia Father   . Hypertension Father     Review of Systems: The review of systems is per the HPI.   He has a chronic bladder drain in place. He says that he always has a chronic UTI.  He did hav that infection several weeks ago.e a All other systems were reviewed and are negative.  Physical Exam: BP 132/84  Pulse 69  Ht 5\' 10"  (1.778 m)  Wt 78.98 kg (174 lb 1.9 oz)  BMI 24.98 kg/m2  SpO2 99% Patient is  pleasant and in no acute distress. Skin is warm and dry. Color is normal.  HEENT is unremarkable. Normocephalic/atraumatic. PERRL. Sclera are nonicteric. Neck is supple. No masses. No JVD. Lungs are clear. Cardiac exam shows a regular rate and rhythm. No gallop,  murmur, or click. Abdomen is soft. Extremities are without edema. Gait and ROM are intact. No gross neurologic deficits noted.  Laboratory data:     Assessment / Plan: 1. Dyspnea. Etiology is not entirely clear. Certainly he has an anginal substrate. Prior cardiac catheterization in February showed occlusion of the vein graft to the right coronary and to the obtuse marginal vessels. I'll need to review his studies to see if there is any opportunity for better revascularization. His LV function that time was only mildly reduced. Left ventricular filling pressures were normal. I recommended pulmonary function studies to further evaluate. We will also check a CBC today. I have started him on isosorbide 60 mg today to see if this will help with his symptoms. We did consider her Ranexa but I think the cost of this will be prohibitive.  2. Hypertension, well controlled. Continue current medication.  3. Coronary disease status post CABG in 2008. Known occlusion of the vein grafts. Patent LIMA graft to the LAD. Continue aspirin and Plavix.  4. Hyperlipidemia. History of severe hypertriglyceridemia. Continue fenofibrate and fish oil. Followup fasting lab work today.   Addendum: Prior cardiac catheterization films were reviewed. The patient has a codominant left circumflex system. The first and second marginal branches begin as a central trunk. This is occluded. The graft to these vessels is also occluded. There are no significant collaterals. There is nonobstructive disease in the distal circumflex. The LAD is supplied by the IMA graft. The right coronary is occluded. It does have right to right and left-to-right collaterals but is not suitable for PCI. The graft to this vessel is also occluded.

## 2011-09-18 NOTE — Patient Instructions (Signed)
We will start you on isosorbide 60 mg daily in addition to your other medications.  We will schedule you for Pulmonary function testing.  We will call with the results of your lab tests.

## 2011-09-19 LAB — HEPATIC FUNCTION PANEL
ALT: 36 U/L (ref 0–53)
AST: 28 U/L (ref 0–37)
Albumin: 4.7 g/dL (ref 3.5–5.2)
Alkaline Phosphatase: 47 U/L (ref 39–117)
Bilirubin, Direct: 0.1 mg/dL (ref 0.0–0.3)
Total Bilirubin: 0.6 mg/dL (ref 0.3–1.2)
Total Protein: 7.9 g/dL (ref 6.0–8.3)

## 2011-09-19 LAB — BASIC METABOLIC PANEL
BUN: 31 mg/dL — ABNORMAL HIGH (ref 6–23)
Calcium: 9.9 mg/dL (ref 8.4–10.5)
GFR: 46.58 mL/min — ABNORMAL LOW (ref >60.00)
Glucose, Bld: 121 mg/dL — ABNORMAL HIGH (ref 70–99)
Potassium: 4.7 mEq/L (ref 3.5–5.1)
Sodium: 139 mEq/L (ref 135–145)

## 2011-09-19 LAB — LIPID PANEL
Cholesterol: 204 mg/dL — ABNORMAL HIGH (ref 0–200)
HDL: 37.1 mg/dL — ABNORMAL LOW (ref >39.00)
Total CHOL/HDL Ratio: 5
Triglycerides: 331 mg/dL — ABNORMAL HIGH (ref 0.0–149.0)
VLDL: 66.2 mg/dL — ABNORMAL HIGH (ref 0.0–40.0)

## 2011-09-20 ENCOUNTER — Ambulatory Visit (INDEPENDENT_AMBULATORY_CARE_PROVIDER_SITE_OTHER): Payer: Self-pay | Admitting: Internal Medicine

## 2011-09-20 DIAGNOSIS — I209 Angina pectoris, unspecified: Secondary | ICD-10-CM

## 2011-09-20 DIAGNOSIS — E785 Hyperlipidemia, unspecified: Secondary | ICD-10-CM

## 2011-09-20 DIAGNOSIS — R0600 Dyspnea, unspecified: Secondary | ICD-10-CM

## 2011-09-20 DIAGNOSIS — I2510 Atherosclerotic heart disease of native coronary artery without angina pectoris: Secondary | ICD-10-CM

## 2011-09-20 DIAGNOSIS — I10 Essential (primary) hypertension: Secondary | ICD-10-CM

## 2011-09-20 LAB — PULMONARY FUNCTION TEST

## 2011-09-20 LAB — LDL CHOLESTEROL, DIRECT: Direct LDL: 113.5 mg/dL

## 2011-09-20 NOTE — Progress Notes (Signed)
PFT done today. 

## 2011-10-01 ENCOUNTER — Telehealth: Payer: Self-pay

## 2011-10-01 NOTE — Telephone Encounter (Signed)
Patient called was told PFT's look good.

## 2011-10-10 ENCOUNTER — Other Ambulatory Visit: Payer: Self-pay | Admitting: Physician Assistant

## 2011-12-18 ENCOUNTER — Ambulatory Visit (INDEPENDENT_AMBULATORY_CARE_PROVIDER_SITE_OTHER): Payer: Self-pay | Admitting: Cardiology

## 2011-12-18 ENCOUNTER — Encounter: Payer: Self-pay | Admitting: Cardiology

## 2011-12-18 VITALS — BP 144/88 | HR 59 | Ht 70.0 in | Wt 176.1 lb

## 2011-12-18 DIAGNOSIS — I2510 Atherosclerotic heart disease of native coronary artery without angina pectoris: Secondary | ICD-10-CM

## 2011-12-18 DIAGNOSIS — I209 Angina pectoris, unspecified: Secondary | ICD-10-CM

## 2011-12-18 DIAGNOSIS — R0989 Other specified symptoms and signs involving the circulatory and respiratory systems: Secondary | ICD-10-CM

## 2011-12-18 DIAGNOSIS — I10 Essential (primary) hypertension: Secondary | ICD-10-CM

## 2011-12-18 DIAGNOSIS — R0600 Dyspnea, unspecified: Secondary | ICD-10-CM

## 2011-12-18 MED ORDER — AMLODIPINE BESYLATE 5 MG PO TABS: 5.0000 mg | ORAL_TABLET | Freq: Every day | ORAL | Status: DC

## 2011-12-18 NOTE — Patient Instructions (Addendum)
Continue your current therapy.  Add amlodipine 5 mg daily for BP and angina  I will see you in 3 months.

## 2011-12-18 NOTE — Progress Notes (Signed)
[name] Date of Birth: 23-Aug-1960 Medical Record #[id number]  History of Present Illness: Mr. Haden is seen for followup today. He continues to complain of shortness of breath with any activity. This has really limited his ability to exercise. He has used nitroglycerin sublingual twice a week. He denies any orthopnea or PND. When he tries to run upstairs he gets short of breath and has substernal chest pain. He doesn't think that isosorbide has helped. He also complains that he is having more left upper quadrant abdominal pain. Is similar to symptoms he had with pancreatitis in the past but not as severe. He's had no fever or nausea or vomiting.  Current Outpatient Prescriptions on File Prior to Visit  Medication Sig Dispense Refill  . aspirin 81 MG tablet Take 1 tablet (81 mg total) by mouth daily.      . clopidogrel (PLAVIX) 75 MG tablet TAKE ONE TABLET BY MOUTH EVERY DAY WITH BREAKFAST  30 tablet  5  . fenofibrate 54 MG tablet TAKE ONE TABLET BY MOUTH EVERY DAY  30 tablet  5  . fish oil-omega-3 fatty acids 1000 MG capsule Take 2 capsules (2 g total) by mouth 2 (two) times daily.      . isosorbide mononitrate (IMDUR) 60 MG 24 hr tablet Take 1 tablet (60 mg total) by mouth daily.  30 tablet  11  . losartan (COZAAR) 100 MG tablet Take 1 tablet (100 mg total) by mouth daily.  30 tablet  11  . metoprolol (LOPRESSOR) 50 MG tablet Take 1 tablet (50 mg total) by mouth 2 (two) times daily.  60 tablet  11  . nitroGLYCERIN (NITROSTAT) 0.4 MG SL tablet Place 0.4 mg under the tongue every 5 (five) minutes as needed. For chest pain      . pravastatin (PRAVACHOL) 40 MG tablet Take 1 tablet (40 mg total) by mouth every evening.  30 tablet  11  . amLODipine (NORVASC) 5 MG tablet Take 1 tablet (5 mg total) by mouth daily.  180 tablet  3  . [DISCONTINUED] nitroGLYCERIN (NITROSTAT) 0.4 MG SL tablet Place 1 tablet (0.4 mg total) under the tongue every 5 (five) minutes as needed for chest pain.   25 tablet  12    No Known Allergies  Past Medical History  Diagnosis Date  . Coronary artery disease     s/p CABG in 2008; s/p cath in 2011 for med rx. Cath 02/2011 in setting of NSTEMI secondary to occlusion of SVG-OMs, treated medically with anatomy not suitable for revasculization. EF 45-50% by cath 02/2011.  [name] Dyslipidemia   . Hypertension   . Recurrent UTI   . History of renal stone   . Bladder exstrophy      Congenital- had multiple surgeries as a child  . Chronic kidney disease (CKD), stage II (mild)   . Pancreatitis     Past Surgical History  Procedure Date  . Cardiac catheterization 07/28/2009    EF 60%; Grafts patent except for SVG to the AM and PD. Native RCA is occluded.  . Cardiac catheterization 03/20/2006    EF 60-65%  . Coronary artery bypass graft 03/2006    LIMA GRAFT TO LAD, SAPHENOUS VEIN GRAFT TO THE ACUTE MARGINAL BRANCH THE RIGHT CORONARY, SAPHENOUS VEIN GRAFT TO THE PDA, SEQUENTIAL VEIN GRAFT TO THE FIRST AND SECOND OBTUSE MARGINAL VESSELS  . Cardiovascular stress test 06/12/2009    EF 71%, SMALL AREA OF INFARCT/ISCHEMIA IN INFERIOR WALL; FELT TO NOT BE CHANGED.  History  Smoking status  . Never Smoker   Smokeless tobacco  . Not on file    History  Alcohol Use No    Family History  Problem Relation Age of Onset  . Hyperlipidemia Mother   . Hypertension Mother   . Hyperlipidemia Father   . Hypertension Father     Review of Systems: The review of systems is per the HPI.   He has a chronic bladder drain in place. He says that he always has a chronic UTI. He has a history of pancreatitis related to severe hypertriglyceridemia. All other systems were reviewed and are negative.  Physical Exam: BP 144/88  Pulse 59  Ht 5\' 10"  (1.778 m)  Wt 176 lb 1.9 oz (79.888 kg)  BMI 25.27 kg/m2  SpO2 97% Patient is  pleasant and in no acute distress. Skin is warm and dry. Color is normal.  HEENT is unremarkable. Normocephalic/atraumatic. PERRL. Sclera are  nonicteric. Neck is supple. No masses. No JVD. Lungs are clear. Cardiac exam shows a regular rate and rhythm. No gallop, murmur, or click. Abdomen is soft. No masses or bruits. No tenderness. Bowel sounds are positive. Extremities are without edema. Gait and ROM are intact. No gross neurologic deficits noted.  Laboratory data: ECG demonstrates normal sinus rhythm with a rate of 58 beats per minute. He has nonspecific T-wave abnormality.    Assessment / Plan: 1. Dyspnea. I think this is an anginal equivalent symptom. Pulmonary function studies were normal. I reviewed his prior cardiac catheterization. There is occlusion of the first and second marginal branches. The graft to th these branches is occluded. The right coronary is occluded with collateral flow. There is a patent LIMA graft to the LAD. I really do not see any further opportunity for intervention. We will try to optimize his medical care. We will continue on aspirin, Plavix, and metoprolol. Continue isosorbide and add amlodipine 5 mg daily. While Ranexa is a consideration I don't think that he would be able to afford this.  2. Hypertension, still not optimal. We'll add amlodipine 5 mg daily.  3. Coronary disease status post CABG in 2008. Known occlusion of the vein grafts. Patent LIMA graft to the LAD. Continue aspirin and Plavix.  4. Hyperlipidemia. History of severe hypertriglyceridemia. Continue fenofibrate and fish oil. Followup lab work 3 months ago showed his triglycerides are down to 330. This is a significant improvement over his prior values.

## 2012-03-23 ENCOUNTER — Ambulatory Visit (INDEPENDENT_AMBULATORY_CARE_PROVIDER_SITE_OTHER): Payer: Self-pay | Admitting: Cardiology

## 2012-03-23 ENCOUNTER — Encounter: Payer: Self-pay | Admitting: Cardiology

## 2012-03-23 VITALS — BP 160/100 | HR 78 | Ht 70.0 in | Wt 178.8 lb

## 2012-03-23 DIAGNOSIS — I2510 Atherosclerotic heart disease of native coronary artery without angina pectoris: Secondary | ICD-10-CM

## 2012-03-23 DIAGNOSIS — I10 Essential (primary) hypertension: Secondary | ICD-10-CM

## 2012-03-23 MED ORDER — NITROGLYCERIN 0.4 MG SL SUBL: 0.4000 mg | SUBLINGUAL_TABLET | SUBLINGUAL | Status: DC | PRN
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
## 2012-03-23 NOTE — Patient Instructions (Signed)
Continue your current therapy  Keep up with your exercise  I will see you again in 4 months with fasting labs.

## 2012-04-21 ENCOUNTER — Other Ambulatory Visit: Payer: Self-pay | Admitting: Nurse Practitioner

## 2012-04-22 ENCOUNTER — Other Ambulatory Visit: Payer: Self-pay | Admitting: *Deleted

## 2012-04-22 MED ORDER — METOPROLOL TARTRATE 50 MG PO TABS: 50.0000 mg | ORAL_TABLET | Freq: Two times a day (BID) | ORAL | Status: DC

## 2012-04-22 MED ORDER — PRAVASTATIN SODIUM 40 MG PO TABS: 40.0000 mg | ORAL_TABLET | Freq: Every day | ORAL | Status: DC

## 2012-07-15 ENCOUNTER — Other Ambulatory Visit: Payer: Self-pay | Admitting: *Deleted

## 2012-07-15 MED ORDER — LOSARTAN POTASSIUM 100 MG PO TABS: 100.0000 mg | ORAL_TABLET | Freq: Every day | ORAL | Status: DC

## 2012-07-15 NOTE — Telephone Encounter (Signed)
Pharmacy called for refill. Fax Received. Refill Completed. Andrew Joyce (R.M.A)

## 2012-10-29 ENCOUNTER — Ambulatory Visit (INDEPENDENT_AMBULATORY_CARE_PROVIDER_SITE_OTHER): Payer: Medicaid Other | Admitting: Physician Assistant

## 2012-10-29 ENCOUNTER — Encounter: Payer: Self-pay | Admitting: Physician Assistant

## 2012-10-29 VITALS — BP 120/70 | HR 66 | Ht 70.0 in | Wt 175.0 lb

## 2012-10-29 DIAGNOSIS — R079 Chest pain, unspecified: Secondary | ICD-10-CM

## 2012-10-29 DIAGNOSIS — I10 Essential (primary) hypertension: Secondary | ICD-10-CM

## 2012-10-29 DIAGNOSIS — E785 Hyperlipidemia, unspecified: Secondary | ICD-10-CM

## 2012-10-29 DIAGNOSIS — I25810 Atherosclerosis of coronary artery bypass graft(s) without angina pectoris: Secondary | ICD-10-CM

## 2012-10-29 MED ORDER — PANTOPRAZOLE SODIUM 40 MG PO TBEC: 40.0000 mg | DELAYED_RELEASE_TABLET | Freq: Every day | ORAL | Status: DC

## 2012-10-29 MED ORDER — NITROGLYCERIN 0.4 MG SL SUBL: 0.4000 mg | SUBLINGUAL_TABLET | SUBLINGUAL | Status: DC | PRN

## 2012-10-29 MED ORDER — ISOSORBIDE MONONITRATE ER 60 MG PO TB24: 90.0000 mg | ORAL_TABLET | Freq: Every day | ORAL | Status: DC

## 2012-10-29 NOTE — Patient Instructions (Signed)
PLEASE FOLLOW UP WITH DR. Swaziland 15/15 2 PM  INCREASE IMDUR TO 90 MG DAILY  START PROTONIX 40 MG DAILY  A REFILL FOR NTG HAS BEEN SENT IN AND YOU HAVE BEEN ADVISED AS TO HOW AND WHEN TO USE NTG  CHEST X-RAY TODAY AT THE [facility] ON [address].

## 2012-10-29 NOTE — Progress Notes (Signed)
[address] [place], [place]  [address] Phone: [phone] Fax:  [phone]  Date:  10/29/2012   ID:  [name], DOB 17-Dec-1960, MRN [id number]  PCP:  No primary provider on file.  Cardiologist:  Dr. Peter Swaziland     History of Present Illness: Andrew Joyce is a 52 y.o. male who returns for evaluation of chest pain.  He has a history of CAD, status post CABG, HTN, HL severe hypertriglyceridemia, CKD. He was admitted in 02/2011 with a non-STEMI.  LHC (2/13): LAD occluded, distal circumflex 40-50, RCA occluded, SVG-distal RCA occluded, SVG-OM1 and OM2 occluded (new-culprit), LIMA-LAD patent, EF 45-50%, anterior apical HK.  Medical therapy was recommended.  Last seen by Dr. Swaziland 03/2012. He continued to complain of dyspnea which was felt to be an anginal equivalent. His cardiac catheterization was reviewed. No further options for intervention were noted. Continued medical therapy was recommended.  He has had CP off and on since this past weekend.  It is left sided and sharp.  It typically occurs in the evening.  He has had CP while doing dishes and doing other minor activities.  No assoc nausea, diaphoresis, radiating symptoms, dyspnea.  Pain is relieved with NTG x 1.  Symptoms are not like prior angina.  He has chronic DOE.  This is unchanged.  He is NYHA Class IIb.  No orthopnea, PND, edema.  No syncope.  No pleuritic CP.  No supine CP.  He is able to exert himself at other times without chest pain.  Labs (9/13):   K 4.7, creatinine 1.7, ALT 36, BNP 36, HDL 37, LDL 113.5, triglycerides 331, Hgb 13.9  Wt Readings from Last 3 Encounters:  10/29/12 175 lb (79.379 kg)  03/23/12 178 lb 12.8 oz (81.103 kg)  12/18/11 176 lb 1.9 oz (79.888 kg)     Past Medical History  Diagnosis Date  . Coronary artery disease     s/p CABG in 2008; s/p cath in 2011 for med rx. Cath 02/2011 in setting of NSTEMI secondary to occlusion of SVG-OMs, treated medically with anatomy  not suitable for revasculization. EF 45-50% by cath 02/2011.  [name] Dyslipidemia   . Hypertension   . Recurrent UTI   . History of renal stone   . Bladder exstrophy      Congenital- had multiple surgeries as a child  . Chronic kidney disease (CKD), stage II (mild)   . Pancreatitis     Current Outpatient Prescriptions  Medication Sig Dispense Refill  . amLODipine (NORVASC) 5 MG tablet Take 1 tablet (5 mg total) by mouth daily.  180 tablet  3  . aspirin 81 MG tablet Take 1 tablet (81 mg total) by mouth daily.      . clopidogrel (PLAVIX) 75 MG tablet TAKE ONE TABLET BY MOUTH EVERY DAY WITH BREAKFAST  30 tablet  5  . fenofibrate 54 MG tablet TAKE ONE TABLET BY MOUTH EVERY DAY  30 tablet  5  . fish oil-omega-3 fatty acids 1000 MG capsule Take 2 capsules (2 g total) by mouth 2 (two) times daily.      . isosorbide mononitrate (IMDUR) 60 MG 24 hr tablet Take 60 mg by mouth daily.      [name] losartan (COZAAR) 100 MG tablet Take 1 tablet (100 mg total) by mouth daily.  30 tablet  6  . metoprolol (LOPRESSOR) 50 MG tablet Take 1 tablet (50 mg total) by mouth 2 (two) times daily.  60 tablet  6  . nitroGLYCERIN (NITROSTAT) 0.4 MG SL tablet Place 1 tablet (0.4 mg total) under the tongue every 5 (five) minutes as needed. For chest pain  100 tablet  3  . pravastatin (PRAVACHOL) 40 MG tablet Take 1 tablet (40 mg total) by mouth daily.  30 tablet  6   No current facility-administered medications for this visit.    Allergies:   No Known Allergies  Social History:  The patient  reports that he has never smoked. He does not have any smokeless tobacco history on file. He reports that he does not drink alcohol or use illicit drugs.   Family History:  The patient's family history includes Hyperlipidemia in his father and mother; Hypertension in his father and mother.   ROS:  Please see the history of present illness.   No fever, cough, melena, hematochezia.  No dysphagia, belching, water brash.  All other systems  reviewed and negative.   PHYSICAL EXAM: VS:  BP 120/70  Pulse 66  Ht 5\' 10"  (1.778 m)  Wt 175 lb (79.379 kg)  BMI 25.11 kg/m2 Well nourished, well developed, in no acute distress HEENT: normal Neck: no JVD Cardiac:  normal S1, S2; RRR; no murmur Lungs:  clear to auscultation bilaterally, no wheezing, rhonchi or rales Abd: soft, nontender, no hepatomegaly Ext: no edema Skin: warm and dry Neuro:  CNs 2-12 intact, no focal abnormalities noted  EKG:  NSR, HR 63, normal axis, no change from prior tracing.     ASSESSMENT AND PLAN:  1. Chest Pain:  Atypical >> Typical features.  His anginal equivalent (DOE) is stable.  I reviewed his prior cath with Dr. Peter Swaziland.  Nuclear perfusion study would be abnormal and would not be helpful.  I will advance his medical Rx by increasing Imdur to 90 QD.  Question GERD as a cause as his symptoms typically occur in the evening some time after eating.  Add Protonix 40 QD. If symptoms escalate or worsen, he may require further testing. 2. CAD:  Adjust medications as noted.  Continue ASA, Plavix, statin, beta blocker, nitrates. 3. Hypertension:  Controlled. 4. Hyperlipidemia: continue statin.  5. Disposition:  F/u with Dr. Peter Swaziland in 1 month.   Signed, Tereso Newcomer, PA-C  10/29/2012 3:10 PM

## 2012-11-19 ENCOUNTER — Telehealth: Payer: Self-pay | Admitting: *Deleted

## 2012-11-19 ENCOUNTER — Telehealth: Payer: Self-pay

## 2012-11-19 ENCOUNTER — Telehealth: Payer: Self-pay | Admitting: Cardiology

## 2012-11-19 NOTE — Telephone Encounter (Signed)
He was approved for losartan and pharmacy notified.

## 2012-11-19 NOTE — Telephone Encounter (Signed)
losartin approved by [facility]

## 2012-11-19 NOTE — Telephone Encounter (Deleted)
Error.. Transferred to medication department

## 2013-01-03 ENCOUNTER — Emergency Department (HOSPITAL_COMMUNITY)
Admission: EM | Admit: 2013-01-03 | Discharge: 2013-01-03 | Disposition: A | Discharge status: Home / Self Care | Payer: Medicaid Other | Attending: Emergency Medicine | Admitting: Emergency Medicine

## 2013-01-03 ENCOUNTER — Emergency Department (HOSPITAL_COMMUNITY): Payer: Medicaid Other

## 2013-01-03 ENCOUNTER — Encounter (HOSPITAL_COMMUNITY): Payer: Self-pay | Admitting: Emergency Medicine

## 2013-01-03 DIAGNOSIS — Z7982 Long term (current) use of aspirin: Secondary | ICD-10-CM | POA: Insufficient documentation

## 2013-01-03 DIAGNOSIS — Y939 Activity, unspecified: Secondary | ICD-10-CM | POA: Insufficient documentation

## 2013-01-03 DIAGNOSIS — Z7902 Long term (current) use of antithrombotics/antiplatelets: Secondary | ICD-10-CM | POA: Insufficient documentation

## 2013-01-03 DIAGNOSIS — Z23 Encounter for immunization: Secondary | ICD-10-CM | POA: Insufficient documentation

## 2013-01-03 DIAGNOSIS — Z79899 Other long term (current) drug therapy: Secondary | ICD-10-CM | POA: Insufficient documentation

## 2013-01-03 DIAGNOSIS — I2510 Atherosclerotic heart disease of native coronary artery without angina pectoris: Secondary | ICD-10-CM | POA: Insufficient documentation

## 2013-01-03 DIAGNOSIS — S61011A Laceration without foreign body of right thumb without damage to nail, initial encounter: Secondary | ICD-10-CM

## 2013-01-03 DIAGNOSIS — Z951 Presence of aortocoronary bypass graft: Secondary | ICD-10-CM | POA: Insufficient documentation

## 2013-01-03 DIAGNOSIS — Z792 Long term (current) use of antibiotics: Secondary | ICD-10-CM | POA: Insufficient documentation

## 2013-01-03 DIAGNOSIS — W230XXA Caught, crushed, jammed, or pinched between moving objects, initial encounter: Secondary | ICD-10-CM | POA: Insufficient documentation

## 2013-01-03 DIAGNOSIS — Z87442 Personal history of urinary calculi: Secondary | ICD-10-CM | POA: Insufficient documentation

## 2013-01-03 DIAGNOSIS — N182 Chronic kidney disease, stage 2 (mild): Secondary | ICD-10-CM | POA: Insufficient documentation

## 2013-01-03 DIAGNOSIS — Z95818 Presence of other cardiac implants and grafts: Secondary | ICD-10-CM | POA: Insufficient documentation

## 2013-01-03 DIAGNOSIS — Y929 Unspecified place or not applicable: Secondary | ICD-10-CM | POA: Insufficient documentation

## 2013-01-03 DIAGNOSIS — I129 Hypertensive chronic kidney disease with stage 1 through stage 4 chronic kidney disease, or unspecified chronic kidney disease: Secondary | ICD-10-CM | POA: Insufficient documentation

## 2013-01-03 DIAGNOSIS — Z87440 Personal history of urinary (tract) infections: Secondary | ICD-10-CM | POA: Insufficient documentation

## 2013-01-03 DIAGNOSIS — E785 Hyperlipidemia, unspecified: Secondary | ICD-10-CM | POA: Insufficient documentation

## 2013-01-03 DIAGNOSIS — S61209A Unspecified open wound of unspecified finger without damage to nail, initial encounter: Secondary | ICD-10-CM | POA: Insufficient documentation

## 2013-01-03 MED ORDER — CEPHALEXIN 250 MG PO CAPS: 500.0000 mg | ORAL_CAPSULE | Freq: Once | ORAL | Status: DC

## 2013-01-03 MED ORDER — CEPHALEXIN 500 MG PO CAPS: 500.0000 mg | ORAL_CAPSULE | Freq: Four times a day (QID) | ORAL | Status: AC

## 2013-01-03 MED ORDER — TETANUS-DIPHTHERIA TOXOIDS TD 5-2 LFU IM INJ: 0.5000 mL | INJECTION | Freq: Once | INTRAMUSCULAR | Status: DC

## 2013-01-03 MED ORDER — ONDANSETRON 4 MG PO TBDP
8.0000 mg | ORAL_TABLET | Freq: Once | ORAL | Status: AC
  Administered 2013-01-03: 8 mg via ORAL
  Filled 2013-01-03: qty 2

## 2013-01-03 MED ORDER — TETANUS-DIPHTH-ACELL PERTUSSIS 5-2.5-18.5 LF-MCG/0.5 IM SUSP
0.5000 mL | Freq: Once | INTRAMUSCULAR | Status: AC
  Administered 2013-01-03: 0.5 mL via INTRAMUSCULAR
  Filled 2013-01-03: qty 0.5

## 2013-01-03 MED ORDER — HYDROCODONE-ACETAMINOPHEN 5-325 MG PO TABS: 2.0000 | ORAL_TABLET | ORAL | Status: DC | PRN

## 2013-01-03 MED ORDER — OXYCODONE-ACETAMINOPHEN 5-325 MG PO TABS
1.0000 | ORAL_TABLET | Freq: Once | ORAL | Status: AC
  Administered 2013-01-03: 1 via ORAL
  Filled 2013-01-03: qty 1

## 2013-01-03 NOTE — Progress Notes (Signed)
Orthopedic Tech Progress Note Patient Details:  Andrew Joyce 01-Jan-1961 [id number] Er doctor said ok to do finger splint  Ortho Devices Type of Ortho Device: Finger splint Ortho Device/Splint Location: RUE Ortho Device/Splint Interventions: Ordered;Application   Jennye Moccasin 01/03/2013, 10:49 PM

## 2013-01-03 NOTE — ED Notes (Signed)
Pt alert, NAD, calm, interactive, back from xray.

## 2013-01-03 NOTE — ED Notes (Signed)
Dr. Ethelene Browns and suture cart at [facility], family at [facility].

## 2013-01-03 NOTE — ED Notes (Addendum)
Bringolf MD at bedside. 

## 2013-01-03 NOTE — ED Notes (Signed)
Bringolf MD at bedside. 

## 2013-01-03 NOTE — ED Notes (Signed)
Dr. Wickline at BS.  
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
## 2013-01-04 NOTE — ED Provider Notes (Signed)
CSN: [id number]     Arrival date & time 01/03/13  1801 History   First MD Initiated Contact with Patient 01/03/13 1853     Chief Complaint  Patient presents with  . Hand Injury   HPI  52 y/o male with history as noted below who presents with a right hand injury. The patient sustained a laceration to his right thumb and the dorsum of his hand with a windshield wiper metal rod smashed down on his hand. He denies any additional injury. Tetanus status is unknown.   Past Medical History  Diagnosis Date  . Coronary artery disease     s/p CABG in 2008; s/p cath in 2011 for med rx. Cath 02/2011 in setting of NSTEMI secondary to occlusion of SVG-OMs, treated medically with anatomy not suitable for revasculization. EF 45-50% by cath 02/2011.  [name] Dyslipidemia   . Hypertension   . Recurrent UTI   . History of renal stone   . Bladder exstrophy      Congenital- had multiple surgeries as a child  . Chronic kidney disease (CKD), stage II (mild)   . Pancreatitis    Past Surgical History  Procedure Laterality Date  . Cardiac catheterization  07/28/2009    EF 60%; Grafts patent except for SVG to the AM and PD. Native RCA is occluded.  . Cardiac catheterization  03/20/2006    EF 60-65%  . Coronary artery bypass graft  03/2006    LIMA GRAFT TO LAD, SAPHENOUS VEIN GRAFT TO THE ACUTE MARGINAL BRANCH THE RIGHT CORONARY, SAPHENOUS VEIN GRAFT TO THE PDA, SEQUENTIAL VEIN GRAFT TO THE FIRST AND SECOND OBTUSE MARGINAL VESSELS  . Cardiovascular stress test  06/12/2009    EF 71%, SMALL AREA OF INFARCT/ISCHEMIA IN INFERIOR WALL; FELT TO NOT BE CHANGED.   Family History  Problem Relation Age of Onset  . Hyperlipidemia Mother   . Hypertension Mother   . Hyperlipidemia Father   . Hypertension Father    History  Substance Use Topics  . Smoking status: Never Smoker   . Smokeless tobacco: Not on file  . Alcohol Use: No    Review of Systems  Constitutional: Negative for fever and chills.  Gastrointestinal:  Negative for nausea and vomiting.  Musculoskeletal: Positive for arthralgias.  Skin: Positive for wound.  All other systems reviewed and are negative.   Allergies  Review of patient's allergies indicates no known allergies.  Home Medications   Current Outpatient Rx  Name  Route  Sig  Dispense  Refill  . amLODipine (NORVASC) 5 MG tablet   Oral   Take 1 tablet (5 mg total) by mouth daily.   180 tablet   3   . aspirin 81 MG tablet   Oral   Take 1 tablet (81 mg total) by mouth daily.         . clopidogrel (PLAVIX) 75 MG tablet   Oral   Take 75 mg by mouth daily with breakfast.         . fenofibrate 54 MG tablet   Oral   Take 54 mg by mouth daily.         . fish oil-omega-3 fatty acids 1000 MG capsule   Oral   Take 2 capsules (2 g total) by mouth 2 (two) times daily.         . isosorbide mononitrate (IMDUR) 60 MG 24 hr tablet   Oral   Take 1.5 tablets (90 mg total) by mouth daily.   45 tablet  11   . losartan (COZAAR) 100 MG tablet   Oral   Take 1 tablet (100 mg total) by mouth daily.   30 tablet   6   . metoprolol (LOPRESSOR) 50 MG tablet   Oral   Take 1 tablet (50 mg total) by mouth 2 (two) times daily.   60 tablet   6   . nitroGLYCERIN (NITROSTAT) 0.4 MG SL tablet   Sublingual   Place 1 tablet (0.4 mg total) under the tongue every 5 (five) minutes as needed. For chest pain   25 tablet   3   . pantoprazole (PROTONIX) 40 MG tablet   Oral   Take 1 tablet (40 mg total) by mouth daily.   30 tablet   11   . pravastatin (PRAVACHOL) 40 MG tablet   Oral   Take 1 tablet (40 mg total) by mouth daily.   30 tablet   6   . cephALEXin (KEFLEX) 500 MG capsule   Oral   Take 1 capsule (500 mg total) by mouth 4 (four) times daily.   28 capsule   0   . HYDROcodone-acetaminophen (NORCO) 5-325 MG per tablet   Oral   Take 2 tablets by mouth every 4 (four) hours as needed.   10 tablet   0    BP 125/75  Pulse 61  Temp(Src) 98.6 F (37 C) (Oral)   Resp 18  SpO2 94% Physical Exam  Constitutional: He is oriented to person, place, and time. He appears well-developed and well-nourished. No distress.  HENT:  Head: Normocephalic and atraumatic.  Mouth/Throat: No oropharyngeal exudate.  Eyes: Conjunctivae are normal. Pupils are equal, round, and reactive to light.  Neck: Normal range of motion. Neck supple.  Cardiovascular: Normal rate and normal heart sounds.  Exam reveals no gallop and no friction rub.   No murmur heard. Pulmonary/Chest: Effort normal and breath sounds normal.  Abdominal: Soft. He exhibits no distension. There is no tenderness.  Musculoskeletal: Normal range of motion. He exhibits no edema and no tenderness.       Right hand: Decreased sensation is not present in the ulnar distribution, is not present in the medial distribution and is not present in the radial distribution. He exhibits no finger abduction, no thumb/finger opposition and no wrist extension trouble.  2.5 cm Stellate laceration over DIP joint on right thumb. Hemostatic. 1.0 cm superficial linear laceration along radial edge of right thumb nail. Approximated and hemostatic. Sensation and function of entire thumb intact. Tendon strength is intact. Pt reports decreased sensation of distal tips of digits 2 and 3. Superficial scratch on dorsum of hand.   Neurological: He is alert and oriented to person, place, and time. He has normal strength and normal reflexes. No cranial nerve deficit or sensory deficit. Coordination normal. GCS eye subscore is 4. GCS verbal subscore is 5. GCS motor subscore is 6.  Skin: Skin is warm and dry.   ED Course  LACERATION REPAIR Date/Time: 01/04/2013 1:06 AM Performed by: Shanon Ace Authorized by: Shanon Ace Consent: Verbal consent obtained. Risks and benefits: risks, benefits and alternatives were discussed Body area: upper extremity Location details: right thumb Laceration length: 2.5 cm Foreign bodies: no  foreign bodies Tendon involvement: tendon partially visualized but appears atraumatic. Nerve involvement: none Vascular damage: no Anesthesia: local infiltration Local anesthetic: lidocaine 1% without epinephrine Anesthetic total: 5 ml Irrigation solution: saline Irrigation method: syringe Amount of cleaning: extensive Debridement: none Degree of undermining: none Skin closure: 4-0 Prolene Number  of sutures: 4 Technique: simple Approximation: close Approximation difficulty: simple Dressing: splint Patient tolerance: Patient tolerated the procedure well with no immediate complications.   (including critical care time) Labs Review Labs Reviewed - No data to display Imaging Review Dg Hand Complete Right  01/03/2013   ADDENDUM REPORT: 01/03/2013 19:56  ADDENDUM: There may be some arthropathy in the 1st IP joint. There is no fracture appreciable in this area. There does appear to be soft tissue injury in this region.   Electronically Signed   By: Bretta Bang M.D.   On: 01/03/2013 19:56   01/03/2013   CLINICAL DATA:  Pain post trauma  EXAM: RIGHT HAND - COMPLETE 3+ VIEW  COMPARISON:  None.  FINDINGS: Frontal, oblique, and lateral views were obtained. There is evidence of old injury in the radial styloid region with remodeling. No acute fracture or dislocation. Joint spaces appear intact. No erosive change. There is no radiopaque foreign body beyond overlying bandage.  IMPRESSION: No acute fracture or dislocation. Evidence of old trauma with remodeling in the radial styloid region.  Electronically Signed: By: Bretta Bang M.D. On: 01/03/2013 19:14   EKG Interpretation   None       MDM   Laceration of right thumb. Wound extensively irrigated. No underlying fracture. Tetanus updated. After irrigation a small aspect extensor tendon was seen but appeared atraumatic. The wound was loosely approximated using 4.0 prolene. Thumb spica placed. Script for keflex provided. Decreased  sensation of digits 2 and 3 are likely neuropraxia from crush injury given no laceration or injury noted to those digits. Will have the patient follow up with hand surgery in 1 day.   1. Thumb laceration, right, initial encounter       Shanon Ace, MD 01/04/13 [phone]

## 2013-01-05 NOTE — ED Provider Notes (Signed)
I have personally seen and examined the patient.  I have discussed plan of care with resident.  I was present for key and critical portions of procedure as documented. I have reviewed the appropriate documentation on PMH/FH/Soc. History.  I have reviewed the documentation of the resident and agree.  Pt well appearing, bleeding controlled on my evaluation and he was able to range fingers without difficulty      Joya Gaskins, MD 01/05/13 [phone]

## 2013-01-11 ENCOUNTER — Encounter: Payer: Self-pay | Admitting: Cardiology

## 2013-01-11 ENCOUNTER — Ambulatory Visit (INDEPENDENT_AMBULATORY_CARE_PROVIDER_SITE_OTHER): Payer: Medicaid Other | Admitting: Cardiology

## 2013-01-11 VITALS — BP 140/80 | HR 60 | Ht 70.0 in | Wt 176.0 lb

## 2013-01-11 DIAGNOSIS — E785 Hyperlipidemia, unspecified: Secondary | ICD-10-CM

## 2013-01-11 DIAGNOSIS — I2510 Atherosclerotic heart disease of native coronary artery without angina pectoris: Secondary | ICD-10-CM

## 2013-01-11 DIAGNOSIS — I10 Essential (primary) hypertension: Secondary | ICD-10-CM

## 2013-01-11 NOTE — Patient Instructions (Signed)
Continue your current therapy  We will schedule you for fasting lab work  I will see you in 6 months. 

## 2013-01-11 NOTE — Progress Notes (Signed)
Andrew Joyce Date of Birth: February 28, 1960 Medical Record #[id number]  History of Present Illness: Andrew Joyce is seen for followup today. He reports he is doing well. He was seen in October with atypical chest pain. This has improved. He only has occasional "stingers" in his chest that last a couple of seconds. He had a recent hand injury with lacerations while changing a wiper blade on a truck.  He has known CAD and is S/p CABG in 2008. He was admitted in Feb. 2013 with a NSTEMI. The LAD and RCA were occluded. There was a 40-50% stenosis in the distal LCx. The vein grafts to the RCA and LCx were occluded. The LIMA to the LAD was patent. There were no targets for revascularization.  Current Outpatient Prescriptions on File Prior to Visit  Medication Sig Dispense Refill  . amLODipine (NORVASC) 5 MG tablet Take 1 tablet (5 mg total) by mouth daily.  180 tablet  3  . aspirin 81 MG tablet Take 1 tablet (81 mg total) by mouth daily.      . clopidogrel (PLAVIX) 75 MG tablet Take 75 mg by mouth daily with breakfast.      . fenofibrate 54 MG tablet Take 54 mg by mouth daily.      . fish oil-omega-3 fatty acids 1000 MG capsule Take 2 capsules (2 g total) by mouth 2 (two) times daily.      [name] HYDROcodone-acetaminophen (NORCO) 5-325 MG per tablet Take 2 tablets by mouth every 4 (four) hours as needed.  10 tablet  0  . isosorbide mononitrate (IMDUR) 60 MG 24 hr tablet Take 1.5 tablets (90 mg total) by mouth daily.  45 tablet  11  . losartan (COZAAR) 100 MG tablet Take 1 tablet (100 mg total) by mouth daily.  30 tablet  6  . metoprolol (LOPRESSOR) 50 MG tablet Take 1 tablet (50 mg total) by mouth 2 (two) times daily.  60 tablet  6  . nitroGLYCERIN (NITROSTAT) 0.4 MG SL tablet Place 1 tablet (0.4 mg total) under the tongue every 5 (five) minutes as needed. For chest pain  25 tablet  3  . pravastatin (PRAVACHOL) 40 MG tablet Take 1 tablet (40 mg total) by mouth daily.  30 tablet  6   No current  facility-administered medications on file prior to visit.    No Known Allergies  Past Medical History  Diagnosis Date  . Coronary artery disease     s/p CABG in 2008; s/p cath in 2011 for med rx. Cath 02/2011 in setting of NSTEMI secondary to occlusion of SVG-OMs, treated medically with anatomy not suitable for revasculization. EF 45-50% by cath 02/2011.  [name] Dyslipidemia   . Hypertension   . Recurrent UTI   . History of renal stone   . Bladder exstrophy      Congenital- had multiple surgeries as a child  . Chronic kidney disease (CKD), stage II (mild)   . Pancreatitis     Past Surgical History  Procedure Laterality Date  . Cardiac catheterization  07/28/2009    EF 60%; Grafts patent except for SVG to the AM and PD. Native RCA is occluded.  . Cardiac catheterization  03/20/2006    EF 60-65%  . Coronary artery bypass graft  03/2006    LIMA GRAFT TO LAD, SAPHENOUS VEIN GRAFT TO THE ACUTE MARGINAL BRANCH THE RIGHT CORONARY, SAPHENOUS VEIN GRAFT TO THE PDA, SEQUENTIAL VEIN GRAFT TO THE FIRST AND SECOND OBTUSE MARGINAL VESSELS  . Cardiovascular stress test  06/12/2009    EF 71%, SMALL AREA OF INFARCT/ISCHEMIA IN INFERIOR WALL; FELT TO NOT BE CHANGED.    History  Smoking status  . Never Smoker   Smokeless tobacco  . Not on file    History  Alcohol Use No    Family History  Problem Relation Age of Onset  . Hyperlipidemia Mother   . Hypertension Mother   . Hyperlipidemia Father   . Hypertension Father     Review of Systems: The review of systems is per the HPI.   He has a chronic bladder drain in place. He says that he always has a chronic UTI. He has a history of pancreatitis related to severe hypertriglyceridemia. All other systems were reviewed and are negative.  Physical Exam: BP 140/80  Pulse 60  Ht 5\' 10"  (1.778 m)  Wt 176 lb (79.833 kg)  BMI 25.25 kg/m2 Patient is  pleasant and in no acute distress. Skin is warm and dry. Color is normal.  HEENT is unremarkable.  Normocephalic/atraumatic. PERRL. Sclera are nonicteric. Neck is supple. No masses. No JVD. Lungs are clear. Cardiac exam shows a regular rate and rhythm. No gallop, murmur, or click. Abdomen is soft. No masses or bruits. No tenderness. Bowel sounds are positive. Extremities are without edema. Gait and ROM are intact. No gross neurologic deficits noted.  Laboratory data:  Assessment / Plan: 1. Dyspnea. Chronic. Stable.This is an anginal equivalent symptom. Pulmonary function studies were normal. I reviewed his prior cardiac catheterization. There is occlusion of the first and second marginal branches. The graft to th these branches is occluded. The right coronary is occluded with collateral flow. There is a patent LIMA graft to the LAD. I really do not see any further opportunity for intervention. We will continue on aspirin, Plavix, and metoprolol. Continue isosorbide and amlodipine. He is not a candidate for her Ranexa due to cost.  2. Hypertension-well controlled. We will continue with his current antihypertensive therapy. Continue sodium restriction and encourage aerobic activity.  3. Coronary disease status post CABG in 2008. Known occlusion of the vein grafts. Patent LIMA graft to the LAD. Continue aspirin and Plavix.  4. Hyperlipidemia. History of severe hypertriglyceridemia. Continue fenofibrate and fish oil. We will schedule him for fasting lab work.

## 2013-01-18 ENCOUNTER — Other Ambulatory Visit: Payer: Medicaid Other

## 2013-05-11 ENCOUNTER — Other Ambulatory Visit: Payer: Self-pay | Admitting: Cardiology

## 2013-05-17 ENCOUNTER — Other Ambulatory Visit: Payer: Self-pay | Admitting: Cardiology

## 2013-05-24 ENCOUNTER — Other Ambulatory Visit: Payer: Self-pay | Admitting: *Deleted

## 2013-05-24 MED ORDER — METOPROLOL TARTRATE 50 MG PO TABS: 50.0000 mg | ORAL_TABLET | Freq: Two times a day (BID) | ORAL | Status: DC

## 2013-06-01 ENCOUNTER — Ambulatory Visit (INDEPENDENT_AMBULATORY_CARE_PROVIDER_SITE_OTHER): Payer: Medicaid Other | Admitting: Physician Assistant

## 2013-06-01 ENCOUNTER — Telehealth: Payer: Self-pay | Admitting: *Deleted

## 2013-06-01 ENCOUNTER — Encounter: Payer: Self-pay | Admitting: Physician Assistant

## 2013-06-01 VITALS — BP 140/80 | HR 52 | Ht 70.0 in | Wt 171.0 lb

## 2013-06-01 DIAGNOSIS — R0602 Shortness of breath: Secondary | ICD-10-CM

## 2013-06-01 DIAGNOSIS — R079 Chest pain, unspecified: Secondary | ICD-10-CM

## 2013-06-01 DIAGNOSIS — N182 Chronic kidney disease, stage 2 (mild): Secondary | ICD-10-CM

## 2013-06-01 DIAGNOSIS — E785 Hyperlipidemia, unspecified: Secondary | ICD-10-CM

## 2013-06-01 DIAGNOSIS — R42 Dizziness and giddiness: Secondary | ICD-10-CM

## 2013-06-01 DIAGNOSIS — R7309 Other abnormal glucose: Secondary | ICD-10-CM

## 2013-06-01 DIAGNOSIS — I2510 Atherosclerotic heart disease of native coronary artery without angina pectoris: Secondary | ICD-10-CM

## 2013-06-01 DIAGNOSIS — N183 Chronic kidney disease, stage 3 unspecified: Secondary | ICD-10-CM | POA: Insufficient documentation

## 2013-06-01 DIAGNOSIS — I10 Essential (primary) hypertension: Secondary | ICD-10-CM

## 2013-06-01 LAB — TSH: TSH: 3.44 u[IU]/mL (ref 0.35–4.50)

## 2013-06-01 LAB — HEPATIC FUNCTION PANEL
ALK PHOS: 54 U/L (ref 39–117)
ALT: 26 U/L (ref 0–53)
AST: 26 U/L (ref 0–37)
Albumin: 4.1 g/dL (ref 3.5–5.2)
BILIRUBIN DIRECT: 0.1 mg/dL (ref 0.0–0.3)
BILIRUBIN TOTAL: 0.8 mg/dL (ref 0.2–1.2)
TOTAL PROTEIN: 7.1 g/dL (ref 6.0–8.3)

## 2013-06-01 LAB — CBC WITH DIFFERENTIAL/PLATELET
BASOS PCT: 0.6 % (ref 0.0–3.0)
Basophils Absolute: 0 10*3/uL (ref 0.0–0.1)
Eosinophils Absolute: 0.2 10*3/uL (ref 0.0–0.7)
Eosinophils Relative: 3.6 % (ref 0.0–5.0)
HCT: 40.1 % (ref 39.0–52.0)
Hemoglobin: 13.8 g/dL (ref 13.0–17.0)
LYMPHS PCT: 30.7 % (ref 12.0–46.0)
Lymphs Abs: 1.7 10*3/uL (ref 0.7–4.0)
MCHC: 34.3 g/dL (ref 30.0–36.0)
MCV: 93.4 fl (ref 78.0–100.0)
MONOS PCT: 9.9 % (ref 3.0–12.0)
Monocytes Absolute: 0.5 10*3/uL (ref 0.1–1.0)
NEUTROS PCT: 55.2 % (ref 43.0–77.0)
Neutro Abs: 3 10*3/uL (ref 1.4–7.7)
Platelets: 196 10*3/uL (ref 150.0–400.0)
RBC: 4.3 Mil/uL (ref 4.22–5.81)
RDW: 13.5 % (ref 11.5–15.5)
WBC: 5.4 10*3/uL (ref 4.0–10.5)

## 2013-06-01 LAB — BASIC METABOLIC PANEL
BUN: 28 mg/dL — ABNORMAL HIGH (ref 6–23)
CALCIUM: 9.8 mg/dL (ref 8.4–10.5)
CO2: 26 mEq/L (ref 19–32)
Chloride: 103 mEq/L (ref 96–112)
Creatinine, Ser: 1.4 mg/dL (ref 0.4–1.5)
GFR: 57.37 mL/min — AB (ref >60.00)
Glucose, Bld: 122 mg/dL — ABNORMAL HIGH (ref 70–99)
Potassium: 3.8 mEq/L (ref 3.5–5.1)
SODIUM: 139 meq/L (ref 135–145)

## 2013-06-01 MED ORDER — ISOSORBIDE MONONITRATE ER 60 MG PO TB24: ORAL_TABLET | ORAL | Status: DC

## 2013-06-01 NOTE — Telephone Encounter (Signed)
lmptcb for lab results. Per Brynda Rim. PA to add on Hgb A1 C to lab drawn today, faxed over add-on request to lab.

## 2013-06-01 NOTE — Patient Instructions (Signed)
Your physician has recommended you make the following change in your medication:   1. CHANGE IMDUR TO 60 MG IN AM 30 MG IN PM    Your physician recommends that you return for lab work in: BMET , CBC, TSH, LFT TODAY  Your physician has requested that you have an echocardiogram. Echocardiography is a painless test that uses sound waves to create images of your heart. It provides your doctor with information about the size and shape of your heart and how well your heart's chambers and valves are working. This procedure takes approximately one hour. There are no restrictions for this procedure.  USE CAUTION WHEN STANDING : BEFORE STANDING PUMP CALVES MUSCLES STAND SLOWLY AND THIS DOES NOT WORK AFTER A WEEK OR TWO YOU MAY GET OTC COMPRESSION STOCKING  Your physician recommends that you schedule a follow-up appointment in: WITH SCOTT WEAVER  3 WEEKS SAME DAY DR Martinique IN OFFICE

## 2013-06-01 NOTE — Progress Notes (Signed)
Cardiology Office Note   Date:  06/01/2013   ID:  Andrew Joyce, DOB 1960-11-06, MRN [id number]  PCP:  No primary provider on file.  Cardiologist:  Dr. Peter Martinique      History of Present Illness: Andrew Joyce is a 53 y.o. male with a history of CAD status post CABG in 2008, HTN, HL, CKD.  He suffered a non-STEMI in February 2013. Culprit was felt to be an occluded SVG-OM1/OM2. Native circumflex had 40-50% distal stenosis. There were no targets for revascularization and he was treated medically. Last seen by Dr. Martinique 01/2013.  He presents to the office today with complaints of near syncope for the last 3 weeks. This only occurs with standing. He denies frank syncope. He has an occasional chest ache that occurs at any time. He typically takes nitroglycerin one to 2 times a week. This has been a stable problem for him for the last year without significant change. He denies any chest pain associated with his orthostatic intolerance. He denies exertional chest pain. He continues to note dyspnea with exertion. He is NYHA class 2-2b. He denies orthopnea, PND or edema. He denies fevers, chills, cough, melena, hematochezia, vomiting, diarrhea.   Studies:  - LHC (02/2011):  LAD occluded, distal LAD filled via left to left collaterals, distal CFX 40-50%, proximal RCA occluded (right to right collaterals), distal RCA occluded, SVG-RCA occluded proximally, SVG-OM1/OM2 occluded (new when compared to films from 2011 - culprit for NSTEMI), LIMA-LAD patent, anterior apical HK, EF 45-50% - medical therapy  - Nuclear (06/2009):  EF 71%, small area of infarct/ischemia in inferior wall-unchanged from prior   Recent Labs: No results found for requested labs within last 365 days.  Wt Readings from Last 3 Encounters:  06/01/13 171 lb (77.565 kg)  01/11/13 176 lb (79.833 kg)  10/29/12 175 lb (79.379 kg)     Past Medical History  Diagnosis Date  . Coronary artery disease     s/p CABG in  2008; s/p cath in 2011 for med rx. Cath 02/2011 in setting of NSTEMI secondary to occlusion of SVG-OMs, treated medically with anatomy not suitable for revasculization. EF 45-50% by cath 02/2011.  [name] Dyslipidemia   . Hypertension   . Recurrent UTI   . History of renal stone   . Bladder exstrophy      Congenital- had multiple surgeries as a child  . Chronic kidney disease (CKD), stage II (mild)   . Pancreatitis     Current Outpatient Prescriptions  Medication Sig Dispense Refill  . amLODipine (NORVASC) 5 MG tablet Take 1 tablet (5 mg total) by mouth daily.  180 tablet  3  . aspirin 81 MG tablet Take 1 tablet (81 mg total) by mouth daily.      . clopidogrel (PLAVIX) 75 MG tablet Take 75 mg by mouth daily with breakfast.      . COZAAR 100 MG tablet TAKE ONE TABLET BY MOUTH ONCE DAILY  30 tablet  0  . fenofibrate 54 MG tablet Take 54 mg by mouth daily.      . fish oil-omega-3 fatty acids 1000 MG capsule Take 2 capsules (2 g total) by mouth 2 (two) times daily.      . isosorbide mononitrate (IMDUR) 60 MG 24 hr tablet Take 1.5 tablets (90 mg total) by mouth daily.  45 tablet  11  . metoprolol (LOPRESSOR) 50 MG tablet Take 1 tablet (50 mg total) by mouth 2 (two) times daily.  60 tablet  1  .  nitroGLYCERIN (NITROSTAT) 0.4 MG SL tablet Place 1 tablet (0.4 mg total) under the tongue every 5 (five) minutes as needed. For chest pain  25 tablet  3  . pravastatin (PRAVACHOL) 40 MG tablet TAKE ONE TABLET BY MOUTH ONCE DAILY  30 tablet  1   No current facility-administered medications for this visit.    Allergies:   Review of patient's allergies indicates no known allergies.   Social History:  The patient  reports that he has never smoked. He does not have any smokeless tobacco history on file. He reports that he does not drink alcohol or use illicit drugs.   Family History:  The patient's family history includes Hyperlipidemia in his father and mother; Hypertension in his father and mother.   ROS:   Please see the history of present illness.   He notes increased fatigue.   All other systems reviewed and negative.   PHYSICAL EXAM: VS:  BP 140/80  Pulse 52  Ht 5\' 10"  (1.778 m)  Wt 171 lb (77.565 kg)  BMI 24.54 kg/m2  Orthostatic vital signs Blood pressure lying  129/79, heart rate 52  Blood pressure sitting 170/87, HR 53 Blood pressure standing 177/99, HR 55  Well nourished, well developed, in no acute distress HEENT: normal Neck: no JVD Cardiac:  normal S1, S2; RRR; no murmur Lungs:  clear to auscultation bilaterally, no wheezing, rhonchi or rales Abd: soft, nontender, no hepatomegaly Ext: no edema Skin: warm and dry Neuro:  CNs 2-12 intact, no focal abnormalities noted  EKG:  Sinus brady, HR 52, no change from prior tracing     ASSESSMENT AND PLAN:  1. Dizziness: He has symptoms of orthostatic intolerance. However, his blood pressure does not significantly drop from lying to standing today. Question if this is related to his medications. I asked him to change his isosorbide to 60 mg the morning and 30 mg in the evening. We also discussed standing slowly and pumping his calf muscles before standing. I have also recommended that he increase his fluid intake.  If this does not help, he should try over-the-counter compression stockings. I will obtain a basic metabolic panel, CBC, TSH and LFTs. I will also obtain an echocardiogram. 2. CAD (coronary artery disease): He has stable chest pain. Continue aspirin, Plavix, nitrates, beta blocker. 3. HTN (hypertension): Blood pressure controlled.  He does have an elevated blood pressure from lying to standing. I Joyce not certain of the significance of this. Continue current therapy for now. 4. Hyperlipidemia: Continue statin. 5. CKD (chronic kidney disease) stage 2, GFR 60-89 ml/min: Check basic metabolic panel today. 6. Shortness of breath: This is a chronic symptom. Obtain echocardiogram as noted above. 7. Disposition: Followup with me in 3  weeks.   Signed, Versie Starks, MHS 06/01/2013 8:57 Joyce    [facility] [address], [place], [place]  [address] Phone: [phone]; Fax: [phone]

## 2013-06-02 ENCOUNTER — Telehealth: Payer: Self-pay | Admitting: *Deleted

## 2013-06-02 ENCOUNTER — Other Ambulatory Visit (INDEPENDENT_AMBULATORY_CARE_PROVIDER_SITE_OTHER): Payer: Medicaid Other

## 2013-06-02 DIAGNOSIS — R7309 Other abnormal glucose: Secondary | ICD-10-CM

## 2013-06-02 LAB — HEMOGLOBIN A1C: Hgb A1c MFr Bld: 5.9 % (ref 4.6–6.5)

## 2013-06-02 NOTE — Telephone Encounter (Signed)
pt notified about normal Hgb A1 C, no evidence of diabetes. Pt said thank you.

## 2013-06-02 NOTE — Telephone Encounter (Signed)
ptcb today and has been made aware of lab results and that a Hgb A1 C was added on to his lab per [place] and that I will cb with those results once I have them, pt said ok and thank you.

## 2013-06-03 ENCOUNTER — Ambulatory Visit (HOSPITAL_COMMUNITY): Payer: Medicaid Other | Attending: Internal Medicine | Admitting: Radiology

## 2013-06-03 ENCOUNTER — Encounter: Payer: Self-pay | Admitting: Physician Assistant

## 2013-06-03 DIAGNOSIS — R42 Dizziness and giddiness: Secondary | ICD-10-CM

## 2013-06-03 DIAGNOSIS — R55 Syncope and collapse: Secondary | ICD-10-CM | POA: Insufficient documentation

## 2013-06-03 DIAGNOSIS — I2510 Atherosclerotic heart disease of native coronary artery without angina pectoris: Secondary | ICD-10-CM

## 2013-06-03 DIAGNOSIS — R0602 Shortness of breath: Secondary | ICD-10-CM | POA: Insufficient documentation

## 2013-06-03 NOTE — Progress Notes (Signed)
Echocardiogram performed.  

## 2013-06-04 ENCOUNTER — Telehealth: Payer: Self-pay | Admitting: *Deleted

## 2013-06-04 NOTE — Telephone Encounter (Signed)
pt notified about echo results with verbal understanding 

## 2013-06-16 ENCOUNTER — Other Ambulatory Visit: Payer: Self-pay | Admitting: Cardiology

## 2013-06-21 ENCOUNTER — Encounter: Payer: Self-pay | Admitting: Physician Assistant

## 2013-06-21 ENCOUNTER — Ambulatory Visit (INDEPENDENT_AMBULATORY_CARE_PROVIDER_SITE_OTHER): Payer: Medicaid Other | Admitting: Physician Assistant

## 2013-06-21 VITALS — BP 119/76 | HR 55 | Ht 70.0 in | Wt 173.0 lb

## 2013-06-21 DIAGNOSIS — I2510 Atherosclerotic heart disease of native coronary artery without angina pectoris: Secondary | ICD-10-CM

## 2013-06-21 DIAGNOSIS — I951 Orthostatic hypotension: Secondary | ICD-10-CM

## 2013-06-21 DIAGNOSIS — I10 Essential (primary) hypertension: Secondary | ICD-10-CM

## 2013-06-21 DIAGNOSIS — E785 Hyperlipidemia, unspecified: Secondary | ICD-10-CM

## 2013-06-21 NOTE — Patient Instructions (Signed)
Your physician recommends that you schedule a follow-up appointment in: 3 MONTHS WITH DR. Martinique  NO CHANGES WERE MADE TODAY

## 2013-06-21 NOTE — Progress Notes (Signed)
Cardiology Office Note   Date:  06/21/2013   ID:  Andrew Joyce, DOB 1960/08/23, MRN [id number]  PCP:  No primary provider on file.  Cardiologist:  Dr. Peter Martinique      History of Present Illness: Andrew Joyce is a 53 y.o. male with a hx of CAD s/p CABG in 2008, HTN, HL, CKD.  He suffered a non-STEMI in February 2013. Culprit was felt to be an occluded SVG-OM1/OM2. Native circumflex had 40-50% distal stenosis. There were no targets for revascularization and he was treated medically.   I saw him 5/26 with symptoms of orthostatic intolerance.  I adjusted his Isosorbide.  Labs were obtained and were unremarkable.  Echo was obtained and demonstrated EF 55-60%, mild inferior hypokinesis, top normal LA/RA size. Poorly visualized aortic valve, however, there is mild central AI - no stenosis.    He returns for follow up.  He has had a few other episodes of near syncope with standing. This typically occurs with standing quickly. He denies frank syncope. He denies chest discomfort. He does note dyspnea with more extreme activities. He is NYHA 2-2b. He denies orthopnea, PND or edema.   Recent Labs: 06/01/2013: ALT 26; Creatinine 1.4; Hemoglobin 13.8; Potassium 3.8; TSH 3.44   Wt Readings from Last 3 Encounters:  06/01/13 171 lb (77.565 kg)  01/11/13 176 lb (79.833 kg)  10/29/12 175 lb (79.379 kg)     Past Medical History  Diagnosis Date  . Coronary artery disease     a. s/p CABG in 2008; b.  s/p cath in 2011 for med rx.;  c.  NSTEMI (02/2011):  LHC (02/2011):  LAD occluded, distal LAD filled via left to left collaterals, distal CFX 40-50%, proximal RCA occluded (right to right collaterals), distal RCA occluded, S-RCA occluded proximally, S-OM1/OM2 occluded (new from 2011 - culprit), L-LAD ok, ant apical HK, EF 45-50% - med Rx.  [name] Dyslipidemia   . Hypertension   . Recurrent UTI   . History of renal stone   . Bladder exstrophy      Congenital- had multiple surgeries as a  child  . Chronic kidney disease (CKD), stage II (mild)   . Pancreatitis   . Hx of echocardiogram     Echo (05/2013):  EF 55-60%, inf HK, mild AI, normal RVSF, RVSP 28 mmHg  . Hx of cardiovascular stress test     Nuclear (06/2009):  EF 71%, small area of infarct/ischemia in inferior wall-unchanged from prior    Current Outpatient Prescriptions  Medication Sig Dispense Refill  . amLODipine (NORVASC) 5 MG tablet Take 1 tablet (5 mg total) by mouth daily.  180 tablet  3  . aspirin 81 MG tablet Take 1 tablet (81 mg total) by mouth daily.      . clopidogrel (PLAVIX) 75 MG tablet Take 75 mg by mouth daily with breakfast.      . fenofibrate 54 MG tablet Take 54 mg by mouth daily.      . fish oil-omega-3 fatty acids 1000 MG capsule Take 2 capsules (2 g total) by mouth 2 (two) times daily.      . isosorbide mononitrate (IMDUR) 60 MG 24 hr tablet 60 MG AM 30 MG PM      . losartan (COZAAR) 100 MG tablet TAKE ONE TABLET BY MOUTH ONCE DAILY  30 tablet  0  . metoprolol (LOPRESSOR) 50 MG tablet Take 1 tablet (50 mg total) by mouth 2 (two) times daily.  60 tablet  1  .  nitroGLYCERIN (NITROSTAT) 0.4 MG SL tablet Place 1 tablet (0.4 mg total) under the tongue every 5 (five) minutes as needed. For chest pain  25 tablet  3  . pravastatin (PRAVACHOL) 40 MG tablet TAKE ONE TABLET BY MOUTH ONCE DAILY  30 tablet  1   No current facility-administered medications for this visit.    Allergies:   Review of patient's allergies indicates no known allergies.   Social History:  The patient  reports that he has never smoked. He does not have any smokeless tobacco history on file. He reports that he does not drink alcohol or use illicit drugs.   Family History:  The patient's family history includes Hyperlipidemia in his father and mother; Hypertension in his father and mother.   ROS:  Please see the history of present illness.     All other systems reviewed and negative.   PHYSICAL EXAM: VS:  BP 119/76  Pulse 55  Ht  5\' 10"  (1.778 m)  Wt 173 lb (78.472 kg)  BMI 24.82 kg/m2  Orthostatic vital signs: Lying: 128/79, 58 Sitting: 143/82, 60 Standing: 150/88, 61 Standing (3 minutes): 151/94, 50  Well nourished, well developed, in no acute distress HEENT: normal Neck: no JVD Cardiac:  normal S1, S2; RRR; no murmur Lungs:  clear to auscultation bilaterally, no wheezing, rhonchi or rales Abd: soft, nontender, no hepatomegaly Ext: no edema Skin: warm and dry Neuro:  CNs 2-12 intact, no focal abnormalities noted    ASSESSMENT AND PLAN:  1. Orthostatic intolerance: We cannot demonstrate a blood pressure drop with lying to standing in the office. However, his symptoms sound fairly consistent with orthostatic hypotension.  Ironically, he has an elevation in his blood pressure from lying to standing in the office.  The significance of this is not known.  His symptoms are fairly infrequent and related to sudden standing. He has had some slight improvement with adjustments in his medications. As noted, his recent echocardiogram demonstrated normal LV function. No significant valvular abnormalities. Lab values were unremarkable.  I have recommended that he continue to stand slowly and to pump his calf muscles. I have asked him to stay well hydrated. He can also raise the head of his bed. He will return for sooner follow up if his symptoms should worsen. 2. CAD (coronary artery disease):  Continue aspirin, Plavix, nitrates, beta blocker. 3. HTN (hypertension):  Controlled.  4. Hyperlipidemia:   Continue statin. 5. CKD (chronic kidney disease) stage 2, GFR 60-89 ml/min:  Recent creatinine stable. 6. Disposition: Followup with Dr. Peter Martinique in 32mos.   Signed, Versie Starks, MHS 06/21/2013 8:17 AM    [facility] [address], [place], [place]  [address] Phone: [phone]; Fax: [phone]

## 2013-07-08 ENCOUNTER — Other Ambulatory Visit: Payer: Self-pay | Admitting: Cardiology

## 2013-07-14 ENCOUNTER — Other Ambulatory Visit: Payer: Self-pay | Admitting: Cardiology

## 2013-07-22 ENCOUNTER — Other Ambulatory Visit: Payer: Self-pay | Admitting: Cardiology

## 2013-09-08 ENCOUNTER — Other Ambulatory Visit: Payer: Self-pay | Admitting: Cardiology

## 2013-09-09 ENCOUNTER — Other Ambulatory Visit: Payer: Self-pay | Admitting: Cardiology

## 2013-09-17 ENCOUNTER — Encounter: Payer: Self-pay | Admitting: Cardiology

## 2013-09-17 ENCOUNTER — Ambulatory Visit (INDEPENDENT_AMBULATORY_CARE_PROVIDER_SITE_OTHER): Payer: Medicare Other | Admitting: Cardiology

## 2013-09-17 VITALS — BP 146/84 | HR 61 | Ht 70.0 in | Wt 169.6 lb

## 2013-09-17 DIAGNOSIS — N182 Chronic kidney disease, stage 2 (mild): Secondary | ICD-10-CM | POA: Diagnosis not present

## 2013-09-17 DIAGNOSIS — I209 Angina pectoris, unspecified: Secondary | ICD-10-CM

## 2013-09-17 DIAGNOSIS — E785 Hyperlipidemia, unspecified: Secondary | ICD-10-CM

## 2013-09-17 DIAGNOSIS — I214 Non-ST elevation (NSTEMI) myocardial infarction: Secondary | ICD-10-CM

## 2013-09-17 DIAGNOSIS — I2510 Atherosclerotic heart disease of native coronary artery without angina pectoris: Secondary | ICD-10-CM | POA: Diagnosis not present

## 2013-09-17 DIAGNOSIS — R079 Chest pain, unspecified: Secondary | ICD-10-CM | POA: Diagnosis not present

## 2013-09-17 DIAGNOSIS — I25119 Atherosclerotic heart disease of native coronary artery with unspecified angina pectoris: Secondary | ICD-10-CM

## 2013-09-17 MED ORDER — FENOFIBRATE 54 MG PO TABS
54.0000 mg | ORAL_TABLET | Freq: Every day | ORAL | Status: DC
Start: 2013-09-17 — End: 2015-01-11

## 2013-09-17 MED ORDER — AMLODIPINE BESYLATE 5 MG PO TABS
5.0000 mg | ORAL_TABLET | Freq: Every day | ORAL | Status: DC
Start: 2013-09-17 — End: 2018-06-19

## 2013-09-17 MED ORDER — ISOSORBIDE MONONITRATE ER 60 MG PO TB24: ORAL_TABLET | ORAL | Status: DC

## 2013-09-17 MED ORDER — LOSARTAN POTASSIUM 100 MG PO TABS
100.0000 mg | ORAL_TABLET | Freq: Every day | ORAL | Status: DC
Start: 2013-09-17 — End: 2013-10-15

## 2013-09-17 MED ORDER — PRAVASTATIN SODIUM 40 MG PO TABS: 40.0000 mg | ORAL_TABLET | Freq: Every day | ORAL | Status: DC

## 2013-09-17 MED ORDER — METOPROLOL TARTRATE 50 MG PO TABS: 50.0000 mg | ORAL_TABLET | Freq: Two times a day (BID) | ORAL | Status: DC

## 2013-09-17 NOTE — Progress Notes (Signed)
Cardiology Office Note   Date:  09/17/2013   ID:  Andrew Joyce, DOB 06-Feb-1960, MRN [id number]  PCP:  No primary provider on file.  Cardiologist:  Dr. Donzell Coller Martinique      History of Present Illness: Andrew Joyce is a 53 y.o. male with a hx of CAD s/p CABG in 2008, HTN, HL, CKD.  He suffered a non-STEMI in February 2013. Culprit was felt to be an occluded SVG-OM1/OM2. Native circumflex had 40-50% distal stenosis. There were no targets for revascularization and he was treated medically.   He has a history orthostatic intolerance. Imdur dose was reduced.     Echo was obtained and demonstrated EF 55-60%, mild inferior hypokinesis, top normal LA/RA size.Poorly visualized aortic valve, however, there is mild central AI - no stenosis.    On follow up today he still notes that if he gets up quickly everything goes black. No true syncope. He complains that he doesn't have any energy in the morning. He has not been sleeping well the past 2-3 weeks due to restless legs. He feels like muscles and nerves in his left leg are firing all the time. He rarely has chest pain. It responds quickly to sl Ntg.    Recent Labs: 06/01/2013: ALT 26; Creatinine 1.4; Hemoglobin 13.8; Potassium 3.8; TSH 3.44   Wt Readings from Last 3 Encounters:  09/17/13 169 lb 9.6 oz (76.93 kg)  06/21/13 173 lb (78.472 kg)  06/01/13 171 lb (77.565 kg)     Past Medical History  Diagnosis Date  . Coronary artery disease     a. s/p CABG in 2008; b.  s/p cath in 2011 for med rx.;  c.  NSTEMI (02/2011):  LHC (02/2011):  LAD occluded, distal LAD filled via left to left collaterals, distal CFX 40-50%, proximal RCA occluded (right to right collaterals), distal RCA occluded, S-RCA occluded proximally, S-OM1/OM2 occluded (new from 2011 - culprit), L-LAD ok, ant apical HK, EF 45-50% - med Rx.  [name] Dyslipidemia   . Hypertension   . Recurrent UTI   . History of renal stone   . Bladder exstrophy      Congenital- had multiple  surgeries as a child  . Chronic kidney disease (CKD), stage II (mild)   . Pancreatitis   . Hx of echocardiogram     Echo (05/2013):  EF 55-60%, inf HK, mild AI, normal RVSF, RVSP 28 mmHg  . Hx of cardiovascular stress test     Nuclear (06/2009):  EF 71%, small area of infarct/ischemia in inferior wall-unchanged from prior    Current Outpatient Prescriptions  Medication Sig Dispense Refill  . amLODipine (NORVASC) 5 MG tablet Take 1 tablet (5 mg total) by mouth daily.  30 tablet  6  . aspirin 81 MG tablet Take 1 tablet (81 mg total) by mouth daily.      . clopidogrel (PLAVIX) 75 MG tablet Take 75 mg by mouth daily with breakfast.      . fenofibrate 54 MG tablet Take 1 tablet (54 mg total) by mouth daily.  30 tablet  6  . fish oil-omega-3 fatty acids 1000 MG capsule Take 2 capsules (2 g total) by mouth 2 (two) times daily.      . isosorbide mononitrate (IMDUR) 60 MG 24 hr tablet 60 MG AM 30 MG PM  60 tablet  6  . losartan (COZAAR) 100 MG tablet Take 1 tablet (100 mg total) by mouth daily.  30 tablet  6  . metoprolol (LOPRESSOR)  50 MG tablet Take 1 tablet (50 mg total) by mouth 2 (two) times daily.  60 tablet  6  . nitroGLYCERIN (NITROSTAT) 0.4 MG SL tablet Place 1 tablet (0.4 mg total) under the tongue every 5 (five) minutes as needed. For chest pain  25 tablet  3  . pravastatin (PRAVACHOL) 40 MG tablet Take 1 tablet (40 mg total) by mouth daily.  30 tablet  6   No current facility-administered medications for this visit.    Allergies:   Review of patient's allergies indicates no known allergies.   Social History:  The patient  reports that he has never smoked. He does not have any smokeless tobacco history on file. He reports that he does not drink alcohol or use illicit drugs.   Family History:  The patient's family history includes Hyperlipidemia in his father and mother; Hypertension in his father and mother.   ROS:  Please see the history of present illness.     All other systems  reviewed and negative.   PHYSICAL EXAM: VS:  BP 146/84  Pulse 61  Ht 5\' 10"  (1.778 m)  Wt 169 lb 9.6 oz (76.93 kg)  BMI 24.34 kg/m2  Well nourished, well developed, in no acute distress HEENT: normal Neck: no JVD Cardiac:  normal S1, S2; RRR; no murmur Lungs:  clear to auscultation bilaterally, no wheezing, rhonchi or rales Abd: soft, nontender, no hepatomegaly Ext: no edema Skin: warm and dry Neuro:  CNs 2-12 intact, no focal abnormalities noted  Laboratory data:  Ecg: NSR rate 61 bpm. Nonspecific TWA  ASSESSMENT AND PLAN:  1. Orthostatic intolerance: Symptoms are fairly well managed at this time. Aware of techniques to minimize symptoms with contraction of calf muscles and getting up slowly. 2. CAD (coronary artery disease):  Continue aspirin, Plavix, nitrates, beta blocker. Symptoms well controlled 3. HTN (hypertension):  Controlled.  4. Hyperlipidemia:   Continue statin. 5. CKD (chronic kidney disease) stage 2, GFR 60-89 ml/min:  Recent creatinine stable. 6. Restless legs. Recommend conservative therapy with heat and massage. If symptoms persist should see primary care.

## 2013-09-17 NOTE — Patient Instructions (Signed)
Continue your current therapy  I will see you in 6 months.   

## 2013-10-08 ENCOUNTER — Telehealth: Payer: Self-pay | Admitting: Cardiology

## 2013-10-08 NOTE — Telephone Encounter (Signed)
Pt is unable to sleep,have not been able to sleep for the last 21 days for nothing but a hout a night. He went to primary doctor,he referred him to a neurologist. He will not see the neurologist for over a month. Please give him something to help him sleep until he see the neurologist.

## 2013-10-08 NOTE — Telephone Encounter (Signed)
Pt. Calling and wants something to help her sleep at night

## 2013-10-11 ENCOUNTER — Other Ambulatory Visit: Payer: Self-pay | Admitting: Cardiology

## 2013-10-11 NOTE — Telephone Encounter (Signed)
Returned call to patient he stated he saw a [facility] on [address].Stated he does not remember name of Dr.Stated he saw that Dr.last week for restless legs and not sleeping in 21 days.Stated Dr.did not want to prescribe any medication.He was told he will need to see neuro surgeon for restless legs.Patient wanted to ask Dr.Jordan if he would prescribe something to help him sleep.Advised he needs to establish with a new PCP.Message sent to [place] for advice.

## 2013-10-12 MED ORDER — ZOLPIDEM TARTRATE 5 MG PO TABS: 5.0000 mg | ORAL_TABLET | Freq: Every evening | ORAL | Status: DC | PRN

## 2013-10-12 NOTE — Telephone Encounter (Signed)
Returned call to patient Dr.Jordan advised ok to try ambien 5 mg at night if needed for sleep.Advised will need to get future refills with PCP.

## 2013-10-12 NOTE — Addendum Note (Signed)
Addended by: Golden Hurter D on: 10/12/2013 11:28 AM   Modules accepted: Orders

## 2013-10-12 NOTE — Telephone Encounter (Signed)
Can try Ambien 5 mg qhs prn sleep #30 no refills until he can see Neuro. Further Rx will need to come through primary care.  Trish Mancinelli Martinique MD, [facility]

## 2013-10-15 ENCOUNTER — Other Ambulatory Visit: Payer: Self-pay

## 2013-10-15 ENCOUNTER — Encounter: Payer: Self-pay | Admitting: Family

## 2013-10-15 ENCOUNTER — Ambulatory Visit (INDEPENDENT_AMBULATORY_CARE_PROVIDER_SITE_OTHER): Payer: Medicare Other | Admitting: Family

## 2013-10-15 ENCOUNTER — Other Ambulatory Visit (INDEPENDENT_AMBULATORY_CARE_PROVIDER_SITE_OTHER): Payer: Medicare Other

## 2013-10-15 VITALS — BP 150/90 | HR 73 | Temp 98.3°F | Resp 18 | Ht 70.0 in | Wt 174.0 lb

## 2013-10-15 DIAGNOSIS — M62831 Muscle spasm of calf: Secondary | ICD-10-CM

## 2013-10-15 DIAGNOSIS — I2510 Atherosclerotic heart disease of native coronary artery without angina pectoris: Secondary | ICD-10-CM | POA: Diagnosis not present

## 2013-10-15 LAB — BASIC METABOLIC PANEL
BUN: 34 mg/dL — ABNORMAL HIGH (ref 6–23)
CALCIUM: 9.7 mg/dL (ref 8.4–10.5)
CO2: 21 meq/L (ref 19–32)
CREATININE: 1.3 mg/dL (ref 0.4–1.5)
Chloride: 104 mEq/L (ref 96–112)
GFR: 61.96 mL/min (ref >60.00)
Glucose, Bld: 103 mg/dL — ABNORMAL HIGH (ref 70–99)
Potassium: 4 mEq/L (ref 3.5–5.1)
Sodium: 134 mEq/L — ABNORMAL LOW (ref 135–145)

## 2013-10-15 LAB — MAGNESIUM: Magnesium: 2.2 mg/dL (ref 1.5–2.5)

## 2013-10-15 LAB — PHOSPHORUS: Phosphorus: 2.6 mg/dL (ref 2.3–4.6)

## 2013-10-15 MED ORDER — METHOCARBAMOL 500 MG PO TABS: 500.0000 mg | ORAL_TABLET | Freq: Three times a day (TID) | ORAL | Status: DC

## 2013-10-15 MED ORDER — LOSARTAN POTASSIUM 100 MG PO TABS: 100.0000 mg | ORAL_TABLET | Freq: Every day | ORAL | Status: DC

## 2013-10-15 NOTE — Progress Notes (Signed)
Pre visit review using our clinic review tool, if applicable. No additional management support is needed unless otherwise documented below in the visit note. 

## 2013-10-15 NOTE — Patient Instructions (Signed)
Thank you for choosing [facility].  Summary/Instructions:   Please stop at the lab prior to leaving for your bloodwork.  Your prescription has been sent to your pharmacy. Please remember the medication may make you drowsy and not to operate heavy machinery until you know how the medication will effect you.  We will be in contact regarding your lab results and appointment for neurology.  Please use ice/heat as needed and stretch as we discussed.

## 2013-10-15 NOTE — Assessment & Plan Note (Addendum)
Obtain electrolytes, magnesium and phosphorus. Rx for Robaxin. Will refer to neurology to r/o potential of seizure. Instructed to ice/heat as needed and work on calf stretching. Follow up if symptoms worsen or fail to improve.   NOTE: Electrolytes, Mg and Phos are within expected range.

## 2013-10-15 NOTE — Progress Notes (Signed)
Subjective:    Patient ID: Andrew Joyce, male    DOB: Aug 09, 1960, 53 y.o.   MRN: [id number]  HPI:  Andrew Joyce is a 52 y.o. male who presents today for restless leg.  Been going on for 30 days and has gotten about an hour of sleep per night. It feels like it is "firing all the time and then cramps up." Has attempted heat packs, tried restless leg cream, melatonin, and previously tried [place]. Has appointment scheduled with another neurologist. Denies trauma or any other incidence that could have caused this from occuring. Has not changed anything in his diet. Continues to maintain a well hydrated states. Expresses frustration over treatment to this point and would like to attempt to get a plan established.   No Known Allergies  Current Outpatient Prescriptions on File Prior to Visit  Medication Sig Dispense Refill  . amLODipine (NORVASC) 5 MG tablet Take 1 tablet (5 mg total) by mouth daily.  30 tablet  6  . aspirin 81 MG tablet Take 1 tablet (81 mg total) by mouth daily.      . clopidogrel (PLAVIX) 75 MG tablet Take 75 mg by mouth daily with breakfast.      . fenofibrate 54 MG tablet Take 1 tablet (54 mg total) by mouth daily.  30 tablet  6  . fish oil-omega-3 fatty acids 1000 MG capsule Take 2 capsules (2 g total) by mouth 2 (two) times daily.      . isosorbide mononitrate (IMDUR) 60 MG 24 hr tablet 60 MG AM 30 MG PM  60 tablet  6  . metoprolol (LOPRESSOR) 50 MG tablet Take 1 tablet (50 mg total) by mouth 2 (two) times daily.  60 tablet  6  . nitroGLYCERIN (NITROSTAT) 0.4 MG SL tablet Place 1 tablet (0.4 mg total) under the tongue every 5 (five) minutes as needed. For chest pain  25 tablet  3  . pravastatin (PRAVACHOL) 40 MG tablet Take 1 tablet (40 mg total) by mouth daily.  30 tablet  6  . pravastatin (PRAVACHOL) 40 MG tablet TAKE ONE TABLET BY MOUTH ONCE DAILY  30 tablet  4  . zolpidem (AMBIEN) 5 MG tablet Take 1 tablet (5 mg total) by mouth at bedtime as needed for  sleep.  30 tablet  0   No current facility-administered medications on file prior to visit.     Past Medical History  Diagnosis Date  . Coronary artery disease     a. s/p CABG in 2008; b.  s/p cath in 2011 for med rx.;  c.  NSTEMI (02/2011):  LHC (02/2011):  LAD occluded, distal LAD filled via left to left collaterals, distal CFX 40-50%, proximal RCA occluded (right to right collaterals), distal RCA occluded, S-RCA occluded proximally, S-OM1/OM2 occluded (new from 2011 - culprit), L-LAD ok, ant apical HK, EF 45-50% - med Rx.  [name] Dyslipidemia   . Hypertension   . Recurrent UTI   . History of renal stone   . Bladder exstrophy      Congenital- had multiple surgeries as a child  . Chronic kidney disease (CKD), stage II (mild)   . Pancreatitis   . Hx of echocardiogram     Echo (05/2013):  EF 55-60%, inf HK, mild AI, normal RVSF, RVSP 28 mmHg  . Hx of cardiovascular stress test     Nuclear (06/2009):  EF 71%, small area of infarct/ischemia in inferior wall-unchanged from prior     Review of Systems  See HPI  Objective:     BP 150/90  Pulse 73  Temp(Src) 98.3 F (36.8 C) (Oral)  Resp 18  Ht 5\' 10"  (1.778 m)  Wt 174 lb (78.926 kg)  BMI 24.97 kg/m2  SpO2 96% Nursing note and vital signs reviewed.  Physical Exam  Constitutional: He is oriented to person, place, and time. He appears well-developed and well-nourished. No distress.  Cardiovascular: Normal rate, regular rhythm and normal heart sounds.   Pulmonary/Chest: Effort normal and breath sounds normal.  Musculoskeletal: He exhibits no tenderness.  No obvious deformity, discoloration, or edema of left calf noted. No palpable deformity or tenderness able to be elicited. Multiple muscle twitches noted throughout proximal 1/2 of calf. ROM equal bilaterally and strength is 4+ bilaterally.   Neurological: He is alert and oriented to person, place, and time. He has normal reflexes.  Skin: Skin is warm and dry.  Psychiatric: He has  a normal mood and affect. His behavior is normal. Judgment and thought content normal.        Assessment & Plan:

## 2013-10-19 ENCOUNTER — Other Ambulatory Visit: Payer: Self-pay

## 2013-10-19 ENCOUNTER — Telehealth: Payer: Self-pay | Admitting: *Deleted

## 2013-10-19 MED ORDER — DIAZEPAM 5 MG PO TABS: 5.0000 mg | ORAL_TABLET | Freq: Three times a day (TID) | ORAL | Status: DC | PRN

## 2013-10-19 MED ORDER — LOSARTAN POTASSIUM 100 MG PO TABS: 100.0000 mg | ORAL_TABLET | Freq: Every day | ORAL | Status: DC

## 2013-10-19 NOTE — Addendum Note (Signed)
Addended by: Mauricio Po D on: 10/19/2013 05:46 PM   Modules accepted: Orders

## 2013-10-19 NOTE — Telephone Encounter (Signed)
Left msg on triage stating Greg rx muscle relaxer for his legs. Was told to let him know if it did not help sxs. Pt states the muscle relaxants is not working, and wanting to go to plan B. Not sure what greg meant by plan B but wanting something else...Andrew Joyce

## 2013-10-20 NOTE — Telephone Encounter (Signed)
MD has replied back via email...Johny Chess

## 2013-10-26 ENCOUNTER — Telehealth: Payer: Self-pay | Admitting: Cardiology

## 2013-10-26 MED ORDER — METOPROLOL TARTRATE 50 MG PO TABS: 50.0000 mg | ORAL_TABLET | Freq: Two times a day (BID) | ORAL | Status: DC

## 2013-10-26 NOTE — Telephone Encounter (Signed)
Rx was sent to pharmacy electronically. LM that Rx was sent in

## 2013-10-26 NOTE — Telephone Encounter (Signed)
Pt called in stating that he has been out of his Metoprolol since Sunday and contacted the pharmacy for it to get refilled but the pharmacy stated that they never received a response from our office. Please call  Thanks

## 2013-10-27 DIAGNOSIS — N200 Calculus of kidney: Secondary | ICD-10-CM | POA: Diagnosis not present

## 2013-10-27 DIAGNOSIS — N410 Acute prostatitis: Secondary | ICD-10-CM | POA: Diagnosis not present

## 2013-10-27 DIAGNOSIS — Q640 Epispadias: Secondary | ICD-10-CM | POA: Diagnosis not present

## 2013-11-01 ENCOUNTER — Other Ambulatory Visit: Payer: Self-pay | Admitting: Family

## 2013-11-02 ENCOUNTER — Other Ambulatory Visit: Payer: Self-pay | Admitting: Family

## 2013-11-02 MED ORDER — DIAZEPAM 5 MG PO TABS: 5.0000 mg | ORAL_TABLET | Freq: Three times a day (TID) | ORAL | Status: DC | PRN

## 2013-11-02 NOTE — Telephone Encounter (Signed)
Called pt to let him know his rx for valium was ready to be picked up.

## 2013-11-18 ENCOUNTER — Telehealth: Payer: Self-pay | Admitting: Cardiology

## 2013-11-18 NOTE — Telephone Encounter (Signed)
Left VM for patient that prior authorization form has just been received by our office (from our [address] location). Malachy Mood, LPN has form.

## 2013-11-18 NOTE — Telephone Encounter (Signed)
Pt called in inquiring about his Losartan prescription. He says that he is waiting to hear about if insurance covers it or not. He would like to know has there been an update. Please call   Thanks

## 2013-11-25 ENCOUNTER — Encounter: Payer: Self-pay | Admitting: Neurology

## 2013-11-25 ENCOUNTER — Ambulatory Visit (INDEPENDENT_AMBULATORY_CARE_PROVIDER_SITE_OTHER): Payer: Medicare Other | Admitting: Neurology

## 2013-11-25 VITALS — BP 120/84 | HR 59 | Ht 70.0 in | Wt 171.2 lb

## 2013-11-25 DIAGNOSIS — R252 Cramp and spasm: Secondary | ICD-10-CM | POA: Diagnosis not present

## 2013-11-25 DIAGNOSIS — G8310 Monoplegia of lower limb affecting unspecified side: Secondary | ICD-10-CM

## 2013-11-25 DIAGNOSIS — I2510 Atherosclerotic heart disease of native coronary artery without angina pectoris: Secondary | ICD-10-CM

## 2013-11-25 DIAGNOSIS — R253 Fasciculation: Secondary | ICD-10-CM

## 2013-11-25 LAB — CALCIUM: CALCIUM: 9.6 mg/dL (ref 8.4–10.5)

## 2013-11-25 LAB — CK: Total CK: 215 U/L (ref 7–232)

## 2013-11-25 LAB — C-REACTIVE PROTEIN: CRP: 0.6 mg/dL — AB (ref <0.60)

## 2013-11-25 MED ORDER — BACLOFEN 10 MG PO TABS: ORAL_TABLET | ORAL | Status: DC

## 2013-11-25 NOTE — Progress Notes (Signed)
[facility] Clinic Note - Initial Visit   Date: 11/25/2013  Andrew Joyce MRN: [id number] DOB: 1960-08-03   Dear Dr. Elna Breslow:  Thank you for your kind referral of Andrew Joyce for consultation of muscle twitches and cramps. Although his history is well known to you, please allow [place] to reiterate it for the purpose of our medical record. The patient was accompanied to the clinic by self.    History of Present Illness: Andrew Joyce is a 53 y.o. left-handed Caucasian male with congenital exstrophy status post multiple surgeries complicated by chronic UTI, CAD s/p CABG (2008), hyperlipidemia, hypertension, and stage II CKD presenting for evaluation of left leg muscle twitches and cramps.  Starting in October 2015, he developed "firing nerves" and cramping of the left legs. Symptoms are worse at night and keep him from sleeping because of painful cramps.  They usually last several minutes. He has no associated weakness or similar symptoms on the right leg or arms.  He went to see his PCP who recommended melatonin, muscle relaxants (robaxin), ambien, and heat packs. He also tried taking valium at night which did not help much.  He says that his legs are "agitated".  There is no burning, tingling.     Denies problems with swallowing or talking.  No recent falls. He is not aware of any arm or leg weakness.   Out-side paper records, electronic medical record, and images have been reviewed where available and summarized as:  Labs 06/02/2013:  HbA1c 5.9, TSH 3.44 Labs 11/25/2013:  Na 134, K 4.0, Chl 104, glucose 103, Cr 1.3, Mg 2.2, Phos 2.6    Past Medical History  Diagnosis Date  . Coronary artery disease     a. s/p CABG in 2008; b.  s/p cath in 2011 for med rx.;  c.  NSTEMI (02/2011):  LHC (02/2011):  LAD occluded, distal LAD filled via left to left collaterals, distal CFX 40-50%, proximal RCA occluded (right to right collaterals), distal RCA  occluded, S-RCA occluded proximally, S-OM1/OM2 occluded (new from 2011 - culprit), L-LAD ok, ant apical HK, EF 45-50% - med Rx.  [name] Dyslipidemia   . Hypertension   . Recurrent UTI   . History of renal stone   . Bladder exstrophy      Congenital- had multiple surgeries as a child  . Chronic kidney disease (CKD), stage II (mild)   . Pancreatitis   . Hx of echocardiogram     Echo (05/2013):  EF 55-60%, inf HK, mild AI, normal RVSF, RVSP 28 mmHg  . Hx of cardiovascular stress test     Nuclear (06/2009):  EF 71%, small area of infarct/ischemia in inferior wall-unchanged from prior    Past Surgical History  Procedure Laterality Date  . Cardiac catheterization  07/28/2009    EF 60%; Grafts patent except for SVG to the AM and PD. Native RCA is occluded.  . Cardiac catheterization  03/20/2006    EF 60-65%  . Coronary artery bypass graft  03/2006    LIMA GRAFT TO LAD, SAPHENOUS VEIN GRAFT TO THE ACUTE MARGINAL BRANCH THE RIGHT CORONARY, SAPHENOUS VEIN GRAFT TO THE PDA, SEQUENTIAL VEIN GRAFT TO THE FIRST AND SECOND OBTUSE MARGINAL VESSELS  . Cardiovascular stress test  06/12/2009    EF 71%, SMALL AREA OF INFARCT/ISCHEMIA IN INFERIOR WALL; FELT TO NOT BE CHANGED.     Medications:  Current Outpatient Prescriptions on File Prior to Visit  Medication Sig Dispense Refill  . amLODipine (NORVASC)  5 MG tablet Take 1 tablet (5 mg total) by mouth daily. 30 tablet 6  . aspirin 81 MG tablet Take 1 tablet (81 mg total) by mouth daily.    . clopidogrel (PLAVIX) 75 MG tablet Take 75 mg by mouth daily with breakfast.    . diazepam (VALIUM) 5 MG tablet Take 1 tablet (5 mg total) by mouth every 8 (eight) hours as needed for muscle spasms. 20 tablet 0  . fenofibrate 54 MG tablet Take 1 tablet (54 mg total) by mouth daily. 30 tablet 6  . fish oil-omega-3 fatty acids 1000 MG capsule Take 2 capsules (2 g total) by mouth 2 (two) times daily.    . isosorbide mononitrate (IMDUR) 60 MG 24 hr tablet 60 MG AM 30 MG PM 60  tablet 6  . losartan (COZAAR) 100 MG tablet Take 1 tablet (100 mg total) by mouth daily. 30 tablet 6  . metoprolol (LOPRESSOR) 50 MG tablet Take 1 tablet (50 mg total) by mouth 2 (two) times daily. 60 tablet 6  . nitroGLYCERIN (NITROSTAT) 0.4 MG SL tablet Place 1 tablet (0.4 mg total) under the tongue every 5 (five) minutes as needed. For chest pain 25 tablet 3  . pravastatin (PRAVACHOL) 40 MG tablet Take 1 tablet (40 mg total) by mouth daily. 30 tablet 6  . pravastatin (PRAVACHOL) 40 MG tablet TAKE ONE TABLET BY MOUTH ONCE DAILY 30 tablet 4  . zolpidem (AMBIEN) 5 MG tablet Take 1 tablet (5 mg total) by mouth at bedtime as needed for sleep. 30 tablet 0   No current facility-administered medications on file prior to visit.    Allergies: No Known Allergies  Family History: Family History  Problem Relation Age of Onset  . Hyperlipidemia Mother     Living, 38  . Hypertension Mother   . Hyperlipidemia Father     Living 3  . Hypertension Father   . Healthy Sister   . Healthy Brother     Social History: History   Social History  . Marital Status: Married    Spouse Name: N/A    Number of Children: N/A  . Years of Education: N/A   Occupational History  . Not on file.   Social History Main Topics  . Smoking status: Never Smoker   . Smokeless tobacco: Not on file  . Alcohol Use: No  . Drug Use: No  . Sexual Activity: Yes   Other Topics Concern  . Not on file   Social History Narrative   Lives with wife and 2 children in a 2 story home.     Education: college.     Retired Physiological scientist.        Review of Systems:  CONSTITUTIONAL: No fevers, chills, night sweats, or weight loss.   EYES: No visual changes or eye pain ENT: No hearing changes.  No history of nose bleeds.   RESPIRATORY: No cough, wheezing and shortness of breath.   CARDIOVASCULAR: Negative for chest pain, and palpitations.   GI: Negative for abdominal discomfort, blood in stools or black stools.  No  recent change in bowel habits.   GU:  No history of incontinence.   MUSCLOSKELETAL: No history of joint pain or swelling.  No myalgias.   SKIN: Negative for lesions, rash, and itching.   HEMATOLOGY/ONCOLOGY: Negative for prolonged bleeding, bruising easily, and swollen nodes.     ENDOCRINE: Negative for cold or heat intolerance, polydipsia or goiter.   PSYCH:  No depression or anxiety symptoms.  NEURO: As Above.   Vital Signs:  BP 120/84 mmHg  Pulse 59  Ht 5' 10"  (1.778 m)  Wt 171 lb 3 oz (77.65 kg)  BMI 24.56 kg/m2  SpO2 99%   General Medical Exam:   General:  Well built individual, comfortable.   Eyes/ENT: see cranial nerve examination.   Neck: No masses appreciated.  Full range of motion without tenderness.  No carotid bruits. Respiratory:  Clear to auscultation, good air entry bilaterally.   Cardiac:  Regular rate and rhythm, no murmur.   Extremities:  No deformities, edema, or skin discoloration. Good capillary refill.   Skin:  Skin color, texture, turgor normal. No rashes or lesions.  Neurological Exam: MENTAL STATUS including orientation to time, place, person, recent and remote memory, attention span and concentration, language, and fund of knowledge is normal.  Speech is not dysarthric.  CRANIAL NERVES: II:  No visual field defects.  Unremarkable fundi.   III-IV-VI: Pupils equal round and reactive to light.  Normal conjugate, extra-ocular eye movements in all directions of gaze.  No nystagmus.  No ptosis.   V:  Normal facial sensation.  VII:  Normal facial symmetry and movements.  Bilateral palmomental reflex.  Snout and Myerson's sign is absent. VIII:  Normal hearing and vestibular function.   IX-X:  Normal palatal movement.   XI:  Normal shoulder shrug and head rotation.   XII:  Normal tongue strength and range of motion, no deviation or fasciculation.  MOTOR:  Moderate atrophy of the left lower leg (quads, medial gastroc).  Prominent and active fasciculations of  the left calf; rare fasciculations of the left upper extremity (triceps, forearm wrist extensors).  No pronator drift.  Tone is normal.    Right Upper Extremity:    Left Upper Extremity:    Deltoid  5/5   Deltoid  5/5   Biceps  5/5   Biceps  5/5   Triceps  5/5   Triceps  5/5   Wrist extensors  5/5   Wrist extensors  5/5   Wrist flexors  5/5   Wrist flexors  5/5   Finger extensors  5/5   Finger extensors  5/5   Finger flexors  5/5   Finger flexors  5/5   Dorsal interossei  5/5   Dorsal interossei  5/5   Abductor pollicis  5/5   Abductor pollicis  5/5   Tone (Ashworth scale)  0  Tone (Ashworth scale)  0   Right Lower Extremity:    Left Lower Extremity:    Hip flexors  5/5   Hip flexors  4/5   Hip extensors  5/5   Hip extensors  4/5   Abductor 5/5  Abductor 5-/5  Adductor 5/5  Adductor 5-/5  Knee flexors  5/5   Knee flexors  4+/5   Knee extensors  5/5   Knee extensors  5-/5   Dorsiflexors  5/5   Dorsiflexors  5/5   Plantarflexors  5/5   Plantarflexors  5/5   Toe extensors  5/5   Toe extensors  5-/5   Toe flexors  5/5   Toe flexors  5-/5   Tone (Ashworth scale)  0  Tone (Ashworth scale)  0   MSRs:  Right  Left brachioradialis 1+  brachioradialis 1+  biceps 1+  biceps 1+  triceps 1+  triceps 1+  patellar 2+  patellar 3+  ankle jerk 2+  ankle jerk 1+  Hoffman no  Hoffman no  plantar response down  plantar response down   SENSORY:  Normal and symmetric perception of light touch, pinprick, vibration, and proprioception.  Romberg's sign absent.   COORDINATION/GAIT: Normal finger-to- nose-finger and heel-to-shin.  Intact rapid alternating movements bilaterally.  Able to rise from a chair without using arms.  Gait narrow based and stable. Tandem and stressed gait intact. He is able to perform squat and rise to stand without difficulty.    IMPRESSION: Mr. Googe is a 53 year-old gentleman presenting for evaluation of  left leg fasciculations and painful cramps. On exam, he has left-sided fasciculations involving the arms and legs, but more notably, there is left leg weakness involving the proximal left leg muscles.  His reflexes are asymmetrical, with brisk patellar knee jerk. Otherwise, no upper motor neuron findings are present.  I had a lengthy discussion with the patient regarding his symptoms and further investigation. Although he is most bothered by his muscle cramps and fasciculations, I I am more concerned about his asymmetrical weakness. With this type of presentation, motor neuron disease has to be evaluated for. I will start with ordering EMG of the left arm and leg and laboratory testing for causes of muscle disease and injury to motor neurons.    PLAN/RECOMMENDATIONS:  1.  Check ESR, CRP, CK, aldolase, PTH, calcium, Lyme, copper, zinc, SPEP/UPEP with IFE 2.  EMG of the left side - MND protocol 3.  From a symptomatic standpoint, I will start him on baclofen 10 mg and uptitrate as tolerated 4.  Stop robaxin given no benefit 5.  Recommend performing posterior leg stretches at bedtime  6.  Drink 2-3 glasses of tonic water at bedtime to see if this helped with muscle cramps 7.  Return to clinic in 6 weeks   The duration of this appointment visit was 60 minutes of face-to-face time with the patient.  Greater than 50% of this time was spent in counseling, explanation of diagnosis, planning of further management, and coordination of care.   Thank you for allowing me to participate in patient's care.  If I can answer any additional questions, I would be pleased to do so.    Sincerely,    Donika K. Posey Pronto, DO

## 2013-11-25 NOTE — Patient Instructions (Signed)
1.  We will check electrodiagnostic testing of the left arm and leg to help determine the cause of your muscle twitches, cramps, and weakness. 2.  Check blood work today 3.  Start taking baclofen 10mg  at bedtime for one week, if tolerating (meaning it doesn't make you too sleepy/weak), then increase to two tablets at bedtime 4.  Stop robaxin 5.  Try doing posterior leg stretches at bedtime and drink 2-3 glasses of tonic water at bedtime for muscle cramps 6.  Return to clinic in 6 weeks

## 2013-11-26 LAB — UIFE/LIGHT CHAINS/TP QN, 24-HR UR
Albumin, U: DETECTED
Alpha 1, Urine: DETECTED — AB
Alpha 2, Urine: DETECTED — AB
BETA UR: DETECTED — AB
Gamma Globulin, Urine: DETECTED — AB
Total Protein, Urine: 70 mg/dL — ABNORMAL HIGH (ref 5–25)

## 2013-11-26 LAB — LYME AB/WESTERN BLOT REFLEX: B BURGDORFERI AB IGG+ IGM: 1.19 {ISR} — AB

## 2013-11-26 LAB — SEDIMENTATION RATE: SED RATE: 47 mm/h — AB (ref 0–16)

## 2013-11-26 LAB — PARATHYROID HORMONE, INTACT (NO CA): PTH: 34 pg/mL (ref 14–64)

## 2013-11-27 LAB — COPPER, SERUM: Copper: 116 ug/dL (ref 70–175)

## 2013-11-27 LAB — ALDOLASE: ALDOLASE: 9.2 U/L — AB (ref <=8.1)

## 2013-11-27 LAB — ZINC: Zinc: 67 ug/dL (ref 60–130)

## 2013-11-29 LAB — SPEP & IFE WITH QIG
Albumin ELP: 55.9 % (ref 55.8–66.1)
Alpha-1-Globulin: 5 % — ABNORMAL HIGH (ref 2.9–4.9)
Alpha-2-Globulin: 10.9 % (ref 7.1–11.8)
BETA GLOBULIN: 5.9 % (ref 4.7–7.2)
Beta 2: 6.5 % (ref 3.2–6.5)
Gamma Globulin: 15.8 % (ref 11.1–18.8)
IGA: 367 mg/dL (ref 68–379)
IgG (Immunoglobin G), Serum: 1310 mg/dL (ref 650–1600)
IgM, Serum: 190 mg/dL (ref 41–251)
Total Protein, Serum Electrophoresis: 7.3 g/dL (ref 6.0–8.3)

## 2013-11-29 LAB — LYME ABY, WSTRN BLT IGG & IGM W/BANDS
B burgdorferi IgG Abs (IB): NEGATIVE
B burgdorferi IgM Abs (IB): NEGATIVE
LYME DISEASE 28 KD IGG: REACTIVE — AB
LYME DISEASE 39 KD IGG: REACTIVE — AB
LYME DISEASE 39 KD IGM: NONREACTIVE
LYME DISEASE 41 KD IGG: NONREACTIVE
LYME DISEASE 66 KD IGG: NONREACTIVE
LYME DISEASE 93 KD IGG: NONREACTIVE
Lyme Disease 18 kD IgG: NONREACTIVE
Lyme Disease 23 kD IgG: NONREACTIVE
Lyme Disease 23 kD IgM: NONREACTIVE
Lyme Disease 30 kD IgG: NONREACTIVE
Lyme Disease 41 kD IgM: NONREACTIVE
Lyme Disease 45 kD IgG: NONREACTIVE
Lyme Disease 58 kD IgG: NONREACTIVE

## 2013-11-30 ENCOUNTER — Telehealth: Payer: Self-pay | Admitting: Neurology

## 2013-11-30 NOTE — Telephone Encounter (Signed)
Called and discussed results of lab testing, including positive Lyme titer.  He has no history of tick bite which makes me think this is less likely significant, however because of his presentation of fasciculations and weakness (motor neuron disease, w/u pendning), I would like to seek opinion of infectious disease to see if this is something that needs treatment.  Patient agreeable to referral.  Glyn Zendejas K. Posey Pronto, DO

## 2013-12-01 ENCOUNTER — Telehealth: Payer: Self-pay | Admitting: *Deleted

## 2013-12-01 ENCOUNTER — Telehealth: Payer: Self-pay | Admitting: Infectious Disease

## 2013-12-01 ENCOUNTER — Other Ambulatory Visit: Payer: Self-pay | Admitting: *Deleted

## 2013-12-01 DIAGNOSIS — A6920 Lyme disease, unspecified: Secondary | ICD-10-CM

## 2013-12-01 NOTE — Telephone Encounter (Signed)
Noted.  Please cancel ID referral - appreciate labs being reviewed by staff and will reassure patient that this nonspecific finding.  Ladislao Cohenour K. Posey Pronto, DO

## 2013-12-01 NOTE — Telephone Encounter (Signed)
Referral faxed and placed in EPIC.

## 2013-12-01 NOTE — Telephone Encounter (Signed)
  Prior authorization for Losartan faxed to patient's insurance company. Awaiting approval.

## 2013-12-01 NOTE — Telephone Encounter (Signed)
THIS PATIENT WAS REFERRED TO [place] FOR LYME INFECTION BUT THEY DO NOT HAVE LYME INFECTION BY SEROLOGIES  THE SCREENING ELISA TEST WAS POSITIVE   BUT THE CONFIRMATORY TESTS INCLUDING  IGM (WHICH SHOULD NOT BE PERFORMED FOR THOSE WITH CHRONIC SYMPTOMS) WAS NEGATIVE WITH ZERO OUT OF THREE BANDS POSITIVE  THE IGG WAS ALSO NEGATIVE WITH ONLY 3 BANDS POSITIVE. AT LEAST 5 BANDS NEED TO BE POSITIVE  THIS PT SHOULD NOT BE REFERRED TO [place] FOR LYME AND DOES NOT HAVE LYME  I WOULD STRONGLY DISCOURAGE HER FROM LOOKING ON THE INTERNET AND BEING MISLED BY NON ID FOLKS RE HER TESTS BUT IN SUM SHE DOES NOT HAVE LYME

## 2013-12-06 ENCOUNTER — Ambulatory Visit (INDEPENDENT_AMBULATORY_CARE_PROVIDER_SITE_OTHER): Payer: Medicare Other | Admitting: Neurology

## 2013-12-06 DIAGNOSIS — M5412 Radiculopathy, cervical region: Secondary | ICD-10-CM

## 2013-12-06 DIAGNOSIS — R253 Fasciculation: Secondary | ICD-10-CM

## 2013-12-06 DIAGNOSIS — G8310 Monoplegia of lower limb affecting unspecified side: Secondary | ICD-10-CM

## 2013-12-06 DIAGNOSIS — G5622 Lesion of ulnar nerve, left upper limb: Secondary | ICD-10-CM

## 2013-12-06 DIAGNOSIS — M5417 Radiculopathy, lumbosacral region: Secondary | ICD-10-CM

## 2013-12-06 DIAGNOSIS — R252 Cramp and spasm: Secondary | ICD-10-CM

## 2013-12-06 NOTE — Procedures (Signed)
[facility] -  Neurology  [place], [address]  [place], [place] [address] Tel: [phone] Fax:  [phone] Test Date:  12/06/2013  Patient: Andrew Joyce DOB: [birth year] Physician: Narda Amber  Sex: Male Height: 5\' 10"  Ref Phys: Narda Amber  ID#: [id number] Temp: 32.0 Technician:    Patient Complaints: This is a 53 year-old gentleman presenting for evaluation of left leg weakness, muscle cramps, and fasiculations.  NCV & EMG Findings: Extensive electrodiagnostic testing of the left upper and lower extremity with additional studies of the mid-thoracic spinal muscles (T7 and T11 levels) shows:  1. Left ulnar sensory response shows prolonged distal latency with preserved amplitude. The remaining sensory studies including the median, radial, sural, and superficial peroneal nerves is within normal limits.  2. Left ulnar motor response shows slowed conduction velocity across the elbow with preserved latency and amplitude. Median motor response is within normal limits. 3. Left peroneal and tibial motor responses are within normal limits. 4. In the upper extremity, chronic motor axon loss changes are seen affecting the pronator teres and biceps muscles, without accompanied active denervation. Fasciculation potentials are rare and isolated to the triceps muscle. 5. In the lower extremity, chronic motor axon loss changes are seen affecting nearly all of the tested muscles with most notable changes affecting the rectus femoris. Active changes are seen affecting the gastrocnemius muscle only. Fasciculation potentials were seen in 3 of the 7 tested muscles. 6. There is no evidence of active motor axon loss changes affecting the mid-thoracic paraspinal muscles.   Impression: 1. Chronic C6 radiculopathy affecting the left upper extremity. 2. Left ulnar neuropathy with slowing across the elbow, purely demyelinating in type. 3. Multilevel intraspinal canal lesion affecting L2-S1 myotomes  bilaterally. These findings are moderate in degree electrically and worse on the left side involving the L2-L4 myotome. Active changes are also seen affecting the medial gastrocnemius muscle. These findings are insufficient for the diagnosis of motor neuron disease, recommend repeat electrodiagnostic testing in 6 months if clinically indicated.    ___________________________ Narda Amber    Nerve Conduction Studies Anti Sensory Summary Table   Stim Site NR Peak (ms) Norm Peak (ms) P-T Amp (V) Norm P-T Amp  Left Median Anti Sensory (2nd Digit)  Wrist    3.5 <3.6 26.0 >15  Left Radial Anti Sensory (Base 1st Digit)  Wrist    2.1 <2.7 21.5 >14  Left Sup Peroneal Anti Sensory (Ant Lat Mall)  12 cm    2.7 <4.6 5.1 >4  Left Sural Anti Sensory (Lat Mall)  Calf    4.3 <4.6 4.0 >4  Left Ulnar Anti Sensory (5th Digit)  Wrist    4.2 <3.1 15.4 >10   Motor Summary Table   Stim Site NR Onset (ms) Norm Onset (ms) O-P Amp (mV) Norm O-P Amp Site1 Site2 Delta-0 (ms) Dist (cm) Vel (m/s) Norm Vel (m/s)  Left Median Motor (Abd Poll Brev)  Wrist    3.7 <4.0 8.0 >6 Elbow Wrist 4.7 29.0 62 >50  Elbow    8.4  7.5         Left Peroneal Motor (Ext Dig Brev)  Ankle    4.4 <6.0 3.9 >2.5 B Fib Ankle 6.6 34.0 52 >40  B Fib    11.0  3.4  Poplt B Fib 2.0 9.0 45 >40  Poplt    13.0  3.2         Left Peroneal TA Motor (Tib Ant)  Fib Head    2.4 <4.5 4.5 >  3 Poplit Fib Head 1.7 9.0 53 >40  Poplit    4.1  4.4         Left Tibial Motor (Abd Hall Brev)  Ankle    4.3 <6.0 8.5 >4 Knee Ankle 8.2 39.0 48 >40  Knee    12.5  7.2         Left Ulnar Motor (Abd Dig Minimi)  Wrist    2.8 <3.1 9.3 >7 B Elbow Wrist 3.8 24.0 63 >50  B Elbow    6.6  8.8  A Elbow B Elbow 2.5 10.0 40 >50  A Elbow    9.1  8.7          H Reflex Studies   NR H-Lat (ms) Lat Norm (ms) L-R H-Lat (ms)  Left Tibial (Gastroc)     33.88 <35    EMG   Side Muscle Ins Act Fibs Psw Fasc Number Recrt Dur Dur. Amp Amp. Poly Poly. Comment  Left  1stDorInt Nml Nml Nml Nml Nml Nml Nml Nml Nml Nml Nml Nml N/A  Left Abd Poll Brev Nml Nml Nml Nml Nml Nml Nml Nml Nml Nml Nml Nml N/A  Left FlexPolLong Nml Nml Nml Nml Nml Nml Nml Nml Nml Nml Nml Nml N/A  Left Ext Indicis Nml Nml Nml Nml Nml Nml Nml Nml Nml Nml Nml Nml N/A  Left PronatorTeres Nml Nml Nml Nml 1- Mod-R Some 1+ Nml Nml Nml Nml N/A  Left Biceps Nml Nml Nml Nml 1- Mod-R Some 1+ Nml Nml Nml Nml N/A  Left Triceps Nml Nml Nml 1+ Nml Nml Nml Nml Nml Nml Nml Nml N/A  Left AntTibialis Nml Nml Nml 1+ 1- Mod-R Some 1+ Nml Nml Nml Nml N/A  Left Gastroc Nml 1+ Nml 1+ 1- Rapid Some Nml Nml Nml Nml Nml N/A  Left RectFemoris Nml Nml Nml 1+ 3- Mod-R Some 1+ Nml Nml Nml Nml N/A  Left GluteusMed Nml Nml Nml Nml Nml Nml Nml Nml Nml Nml Nml Nml N/A  Left Lumbo Parasp Low Nml Nml Nml Nml Nml Nml Nml Nml Nml Nml Nml Nml N/A  Left T7 Parasp Nml Nml Nml Nml Nml Nml Nml Nml Nml Nml Nml Nml N/A  Left T11 Parasp Nml Nml Nml Nml Nml Nml Nml Nml Nml Nml Nml Nml N/A  Left AdductorLong Nml Nml Nml Nml 1- Mod-R Some 1+ Nml Nml Nml Nml N/A  Left BicepsFemS Nml Nml Nml Nml 1- Mod-R Some 1+ Nml Nml Nml Nml N/A  Right AntTibialis Nml Nml Nml Nml 1- Mod Few 1+ Nml Nml Nml Nml N/A  Right Gastroc Nml Nml Nml Nml 1- Mod-R Few 1+ Nml Nml Nml Nml N/A  Right RectFemoris Nml Nml Nml Nml 1- Mod-R Few 1+ Nml Nml Nml Nml N/A      Waveforms:

## 2013-12-15 ENCOUNTER — Ambulatory Visit (INDEPENDENT_AMBULATORY_CARE_PROVIDER_SITE_OTHER): Payer: Medicare Other | Admitting: Neurology

## 2013-12-15 ENCOUNTER — Encounter: Payer: Self-pay | Admitting: Neurology

## 2013-12-15 VITALS — BP 145/80 | HR 92 | Ht 70.0 in | Wt 170.2 lb

## 2013-12-15 DIAGNOSIS — R253 Fasciculation: Secondary | ICD-10-CM | POA: Diagnosis not present

## 2013-12-15 DIAGNOSIS — G8310 Monoplegia of lower limb affecting unspecified side: Secondary | ICD-10-CM | POA: Diagnosis not present

## 2013-12-15 DIAGNOSIS — M5417 Radiculopathy, lumbosacral region: Secondary | ICD-10-CM | POA: Diagnosis not present

## 2013-12-15 DIAGNOSIS — R252 Cramp and spasm: Secondary | ICD-10-CM | POA: Diagnosis not present

## 2013-12-15 DIAGNOSIS — I2510 Atherosclerotic heart disease of native coronary artery without angina pectoris: Secondary | ICD-10-CM | POA: Diagnosis not present

## 2013-12-15 NOTE — Patient Instructions (Signed)
MRI lumbar spine wo contrast.  Results will be communiciated via telephone. Return to clinic in 65-month.

## 2013-12-15 NOTE — Progress Notes (Signed)
Follow-up Visit   Date: 12/15/2013    Jett Fukuda MRN: [id number] DOB: [birth year]   Interim History: Sincere Liuzzi is a 53 y.o. left-handed Caucasian male with congenital exstrophy status post multiple surgeries complicated by chronic UTI, CAD s/p CABG (2008), hyperlipidemia, hypertension, and stage II CKD returning to the clinic for follow-up of left leg twitches and weakness.   History of present illness: Starting in October 2015, he developed "firing nerves" and cramping of the left legs. Symptoms are worse at night and keep him from sleeping because of painful cramps. They usually last several minutes. He has no associated weakness or similar symptoms on the right leg or arms. He went to see his PCP who recommended melatonin, muscle relaxants (robaxin), ambien, and heat packs. He also tried taking valium at night which did not help much. He says that his legs are "agitated". There is no burning, tingling.    UPDATE 12/15/2013: He feels that the agitation of the leg and cramps has improved since starting baclofen and he is able to sleep better through the night.  There has been no change in his leg weakness or twitches.  He has noticed intermittent cramping of the left hand, especially when driving.  Denies problems with swallowing, talking, or shortness of breath. No recent falls.  He denies any pain of his low back.  Here is here to discuss EMG results.   Medications:  Current Outpatient Prescriptions on File Prior to Visit  Medication Sig Dispense Refill  . amLODipine (NORVASC) 5 MG tablet Take 1 tablet (5 mg total) by mouth daily. 30 tablet 6  . aspirin 81 MG tablet Take 1 tablet (81 mg total) by mouth daily.    . baclofen (LIORESAL) 10 MG tablet Take 1 tab at bedtime x 1 week, if tolerating, increase to 2 tab at bedtime. 60 each 5  . clopidogrel (PLAVIX) 75 MG tablet Take 75 mg by mouth daily with breakfast.    . diazepam (VALIUM) 5 MG tablet Take 1  tablet (5 mg total) by mouth every 8 (eight) hours as needed for muscle spasms. 20 tablet 0  . fenofibrate 54 MG tablet Take 1 tablet (54 mg total) by mouth daily. 30 tablet 6  . fish oil-omega-3 fatty acids 1000 MG capsule Take 2 capsules (2 g total) by mouth 2 (two) times daily.    . isosorbide mononitrate (IMDUR) 60 MG 24 hr tablet 60 MG AM 30 MG PM 60 tablet 6  . losartan (COZAAR) 100 MG tablet Take 1 tablet (100 mg total) by mouth daily. 30 tablet 6  . metoprolol (LOPRESSOR) 50 MG tablet Take 1 tablet (50 mg total) by mouth 2 (two) times daily. 60 tablet 6  . nitroGLYCERIN (NITROSTAT) 0.4 MG SL tablet Place 1 tablet (0.4 mg total) under the tongue every 5 (five) minutes as needed. For chest pain 25 tablet 3  . pravastatin (PRAVACHOL) 40 MG tablet Take 1 tablet (40 mg total) by mouth daily. 30 tablet 6  . pravastatin (PRAVACHOL) 40 MG tablet TAKE ONE TABLET BY MOUTH ONCE DAILY 30 tablet 4  . traMADol (ULTRAM) 50 MG tablet Take 50 mg by mouth every 6 (six) hours as needed.  2  . zolpidem (AMBIEN) 5 MG tablet Take 1 tablet (5 mg total) by mouth at bedtime as needed for sleep. 30 tablet 0   No current facility-administered medications on file prior to visit.    Allergies: No Known Allergies  Review of Systems:  CONSTITUTIONAL: No fevers, chills, night sweats, or weight loss.  EYES: No visual changes or eye pain ENT: No hearing changes.  No history of nose bleeds.   RESPIRATORY: No cough, wheezing and shortness of breath.   CARDIOVASCULAR: Negative for chest pain, and palpitations.   GI: Negative for abdominal discomfort, blood in stools or black stools.  No recent change in bowel habits.   GU:  No history of incontinence.   MUSCLOSKELETAL: No history of joint pain or swelling.  No myalgias.   SKIN: Negative for lesions, rash, and itching.   ENDOCRINE: Negative for cold or heat intolerance, polydipsia or goiter.   PSYCH:  No depression or anxiety symptoms.   NEURO: As Above.   Vital  Signs:  BP 145/80 mmHg  Pulse 92  Ht 5' 10"  (1.778 m)  Wt 170 lb 3.2 oz (77.202 kg)  BMI 24.42 kg/m2  SpO2 92%   Neurological Exam: MENTAL STATUS including orientation to time, place, person, recent and remote memory, attention span and concentration, language, and fund of knowledge is normal.  Speech is not dysarthric.  CRANIAL NERVES:  Pupils equal round and reactive to light.  Normal conjugate, extra-ocular eye movements in all directions of gaze.  No ptosis. Normal facial sensation.  Face is symmetric. Palate elevates symmetrically.  Tongue is midline.  MOTOR:  Moderate atrophy of the left lower leg (quads, medial gastroc). Prominent and active fasciculations of the left calf; rare fasciculations of the left upper extremity (triceps, forearm wrist extensors). normal tone.   Right Upper Extremity:    Left Upper Extremity:    Deltoid  5/5   Deltoid  5/5   Biceps  5/5   Biceps  5/5   Triceps  5/5   Triceps  5/5   Wrist extensors  5/5   Wrist extensors  5/5   Wrist flexors  5/5   Wrist flexors  5/5   Finger extensors  5/5   Finger extensors  5/5   Finger flexors  5/5   Finger flexors  5/5   Dorsal interossei  5/5   Dorsal interossei  5/5   Abductor pollicis  5/5   Abductor pollicis  5/5   Tone (Ashworth scale)  0  Tone (Ashworth scale)  0   Right Lower Extremity:    Left Lower Extremity:    Hip flexors  5/5   Hip flexors  4+/5   Hip extensors  5/5   Hip extensors  4+/5   Knee flexors  5/5   Knee flexors  4+/5   Knee extensors  5/5   Knee extensors  5-/5   Dorsiflexors  5/5   Dorsiflexors  5/5   Plantarflexors  5/5   Plantarflexors  5/5   Toe extensors  5/5   Toe extensors  5-/5   Toe flexors  5/5   Toe flexors  5-/5   Tone (Ashworth scale)  0  Tone (Ashworth scale)  0   MSRs:  Right                                                                 Left brachioradialis 1+  brachioradialis 1+  biceps 1+  biceps 1+  triceps 1+  triceps 1+  patellar 2+  patellar 3+  ankle  jerk 2+  ankle jerk 1+  Hoffman no  Hoffman no  plantar response down  plantar response down   COORDINATION/GAIT:   Gait narrow based and stable.   Data: EMG 12/15/2013: 1. Chronic C6 radiculopathy affecting the left upper extremity. 2. Left ulnar neuropathy with slowing across the elbow, purely demyelinating in type. 3. Multilevel intraspinal canal lesion affecting L2-S1 myotomes bilaterally. These findings are moderate in degree electrically and worse on the left side involving the L2-L4 myotome. Active changes are also seen affecting the medial gastrocnemius muscle. These findings are insufficient for the diagnosis of motor neuron disease, recommend repeat electrodiagnostic testing in 6 months if clinically indicated.  Labs 11/25/2013:  CK 215, aldolase 9.2*, PTH 34, SPEP/UPEP with IFE no Mo protein, copper 116, zinc 67, CRP 0.6*, ESR 47   IMPRESSION/PLAN: Mr. Karapetian is a 53 year-old gentleman returning for evaluation of left leg fasciculations, weakness, and painful cramps. His exam remains stable with left-sided fasciculations, left leg weakness and asymmetrical reflexes, with brisk patellar knee jerk. Work-up thus far include lab testing screening for myopathy and motor neuron disease, which is essentially normal.  EMG of the left arm and leg shows chronic multilevel intraspinal canal lesions affecting the lumbosacral region, with active changes isolated to medial gastronemius.  Findings are insufficient for electrodiagnostic diagnosis of motor neuron disease. To further evaluation his leg weakness, MRI of the lumbar spine will be ordered.  For his muscle cramps, recommend increasing baclofen to 21m qhs or baclofen 161mBID, whichever helps patient best. Recommend leg exercises for strengthening which he is already doing at his home gym (previously he worked as heavy weBuilding surveyor Return to clinic in 1-74-monthhe duration of this appointment visit was 25 minutes of face-to-face  time with the patient.  Greater than 50% of this time was spent in counseling, explanation of diagnosis, planning of further management, and coordination of care.   Thank you for allowing me to participate in patient's care.  If I can answer any additional questions, I would be pleased to do so.    Sincerely,    Jourdan Maldonado K. PatPosey ProntoO

## 2013-12-16 ENCOUNTER — Encounter (HOSPITAL_COMMUNITY): Payer: Self-pay | Admitting: Cardiovascular Disease

## 2013-12-17 ENCOUNTER — Ambulatory Visit (HOSPITAL_COMMUNITY)
Admission: RE | Admit: 2013-12-17 | Discharge: 2013-12-17 | Disposition: A | Discharge status: Home / Self Care | Payer: Medicare Other | Source: Ambulatory Visit | Attending: Neurology | Admitting: Neurology

## 2013-12-17 ENCOUNTER — Other Ambulatory Visit: Payer: Self-pay | Admitting: Physician Assistant

## 2013-12-17 DIAGNOSIS — M5126 Other intervertebral disc displacement, lumbar region: Secondary | ICD-10-CM | POA: Diagnosis not present

## 2013-12-17 DIAGNOSIS — N2889 Other specified disorders of kidney and ureter: Secondary | ICD-10-CM | POA: Diagnosis not present

## 2013-12-17 DIAGNOSIS — M5117 Intervertebral disc disorders with radiculopathy, lumbosacral region: Secondary | ICD-10-CM | POA: Diagnosis not present

## 2013-12-17 DIAGNOSIS — R252 Cramp and spasm: Secondary | ICD-10-CM

## 2013-12-17 DIAGNOSIS — G8310 Monoplegia of lower limb affecting unspecified side: Secondary | ICD-10-CM

## 2013-12-17 DIAGNOSIS — M5417 Radiculopathy, lumbosacral region: Secondary | ICD-10-CM

## 2013-12-17 DIAGNOSIS — R253 Fasciculation: Secondary | ICD-10-CM

## 2014-01-11 ENCOUNTER — Other Ambulatory Visit: Payer: Self-pay | Admitting: *Deleted

## 2014-01-11 MED ORDER — BACLOFEN 10 MG PO TABS: ORAL_TABLET | ORAL | Status: DC

## 2014-01-13 ENCOUNTER — Ambulatory Visit: Payer: Self-pay | Admitting: Neurology

## 2014-01-25 ENCOUNTER — Ambulatory Visit (INDEPENDENT_AMBULATORY_CARE_PROVIDER_SITE_OTHER): Payer: Medicare Other | Admitting: Neurology

## 2014-01-25 ENCOUNTER — Encounter: Payer: Self-pay | Admitting: Neurology

## 2014-01-25 DIAGNOSIS — R253 Fasciculation: Secondary | ICD-10-CM | POA: Diagnosis not present

## 2014-01-25 DIAGNOSIS — R252 Cramp and spasm: Secondary | ICD-10-CM

## 2014-01-25 MED ORDER — BACLOFEN 10 MG PO TABS: ORAL_TABLET | ORAL | Status: DC

## 2014-01-25 MED ORDER — TRAMADOL HCL 50 MG PO TABS: 50.0000 mg | ORAL_TABLET | Freq: Every evening | ORAL | Status: DC | PRN

## 2014-01-25 NOTE — Patient Instructions (Addendum)
1.  Take baclofen as follows:  AM  PM  10mg   30mg  2.  OK to take tramadol 50mg  at bedtime as needed for pain 3.  Return to clinic in 5 months

## 2014-01-25 NOTE — Progress Notes (Signed)
Follow-up Visit   Date: 01/25/2014    Andrew Joyce MRN: [id number] DOB: 14-Dec-1960   Interim History: Andrew Joyce is a 54 y.o. left-handed Caucasian male with congenital exstrophy status post multiple surgeries complicated by chronic UTI, CAD s/p CABG (2008), hyperlipidemia, hypertension, and stage II CKD returning to the clinic for follow-up of left leg twitches and weakness.   History of present illness: Starting in October 2015, he developed "firing nerves" and cramping of the left legs. Symptoms are worse at night and keep him from sleeping because of painful cramps. They usually last several minutes. He has no associated weakness or similar symptoms on the right leg or arms. He went to see his PCP who recommended melatonin, muscle relaxants (robaxin), ambien, and heat packs. He also tried taking valium at night which did not help much. He says that his legs are "agitated". There is no burning, tingling.   Follow-up 12/15/2013: He feels that the agitation of the leg and cramps has improved since starting baclofen and he is able to sleep better through the night.  There has been no change in his leg weakness or twitches.  He has noticed intermittent cramping of the left hand, especially when driving.  Denies problems with swallowing, talking, or shortness of breath. No recent falls.  He denies any pain of his low back.  Here is here to discuss EMG results.  UPDATE 01/25/2014:  He reports having cramps about 3-4 times of the night.  He tried his family member's tramadol which completely alleviated his cramps.  He has not noticed left leg weakness to be a problem, but his muscle twitches remain unchanged.    Medications:  Current Outpatient Prescriptions on File Prior to Visit  Medication Sig Dispense Refill  . amLODipine (NORVASC) 5 MG tablet Take 1 tablet (5 mg total) by mouth daily. 30 tablet 6  . aspirin 81 MG tablet Take 1 tablet (81 mg total) by mouth  daily.    . clopidogrel (PLAVIX) 75 MG tablet Take 75 mg by mouth daily with breakfast.    . diazepam (VALIUM) 5 MG tablet Take 1 tablet (5 mg total) by mouth every 8 (eight) hours as needed for muscle spasms. 20 tablet 0  . fenofibrate 54 MG tablet Take 1 tablet (54 mg total) by mouth daily. 30 tablet 6  . fish oil-omega-3 fatty acids 1000 MG capsule Take 2 capsules (2 g total) by mouth 2 (two) times daily.    . isosorbide mononitrate (IMDUR) 60 MG 24 hr tablet TAKE ONE & ONE-HALF TABLETS BY MOUTH ONCE DAILY 45 tablet 6  . losartan (COZAAR) 100 MG tablet Take 1 tablet (100 mg total) by mouth daily. 30 tablet 6  . metoprolol (LOPRESSOR) 50 MG tablet Take 1 tablet (50 mg total) by mouth 2 (two) times daily. 60 tablet 6  . nitroGLYCERIN (NITROSTAT) 0.4 MG SL tablet Place 1 tablet (0.4 mg total) under the tongue every 5 (five) minutes as needed. For chest pain 25 tablet 3  . pravastatin (PRAVACHOL) 40 MG tablet Take 1 tablet (40 mg total) by mouth daily. 30 tablet 6  . zolpidem (AMBIEN) 5 MG tablet Take 1 tablet (5 mg total) by mouth at bedtime as needed for sleep. 30 tablet 0   No current facility-administered medications on file prior to visit.    Allergies: No Known Allergies  Review of Systems:  CONSTITUTIONAL: No fevers, chills, night sweats, or weight loss.  EYES: No visual changes or eye  pain ENT: No hearing changes.  No history of nose bleeds.   RESPIRATORY: No cough, wheezing and shortness of breath.   CARDIOVASCULAR: Negative for chest pain, and palpitations.   GI: Negative for abdominal discomfort, blood in stools or black stools.  No recent change in bowel habits.   GU:  No history of incontinence.   MUSCLOSKELETAL: No history of joint pain or swelling.  No myalgias.   SKIN: Negative for lesions, rash, and itching.   ENDOCRINE: Negative for cold or heat intolerance, polydipsia or goiter.   PSYCH:  No depression or anxiety symptoms.   NEURO: As Above.   Vital Signs:  BP  140/86 mmHg  Pulse 56  Ht 5' 10"  (1.778 m)  Wt 171 lb 6 oz (77.735 kg)  BMI 24.59 kg/m2  SpO2 98%   Neurological Exam: MENTAL STATUS including orientation to time, place, person, recent and remote memory, attention span and concentration, language, and fund of knowledge is normal.  Speech is not dysarthric.  CRANIAL NERVES:  Pupils equal round and reactive to light.  Normal conjugate, extra-ocular eye movements in all directions of gaze.  No ptosis. Normal facial sensation.  Face is symmetric. Palate elevates symmetrically.  Tongue is midline.  MOTOR:  Moderate atrophy of the left lower leg (quads, medial gastroc). Prominent and active fasciculations of the left calf; rare fasciculations of the left upper extremity (triceps, forearm wrist extensors). normal tone.   Right Upper Extremity:    Left Upper Extremity:    Deltoid  5/5   Deltoid  5/5   Biceps  5/5   Biceps  5/5   Triceps  5/5   Triceps  5/5   Wrist extensors  5/5   Wrist extensors  5/5   Wrist flexors  5/5   Wrist flexors  5/5   Finger extensors  5/5   Finger extensors  5/5   Finger flexors  5/5   Finger flexors  5/5   Dorsal interossei  5/5   Dorsal interossei  5/5   Abductor pollicis  5/5   Abductor pollicis  5/5   Tone (Ashworth scale)  0  Tone (Ashworth scale)  0   Right Lower Extremity:    Left Lower Extremity:    Hip flexors  5/5   Hip flexors  5/5   Hip extensors  5/5   Hip extensors  5-/5   Knee flexors  5/5   Knee flexors  5/5   Knee extensors  5/5   Knee extensors  5/5   Dorsiflexors  5/5   Dorsiflexors  5/5   Plantarflexors  5/5   Plantarflexors  5/5   Toe extensors  5/5   Toe extensors  5/5   Toe flexors  5/5   Toe flexors  5/5   Tone (Ashworth scale)  0  Tone (Ashworth scale)  0   MSRs:  Right                                                                 Left brachioradialis 1+  brachioradialis 1+  biceps 1+  biceps 1+  triceps 1+  triceps 1+  patellar 2+  patellar 3+  ankle jerk 2+  ankle jerk 1+   Hoffman no  Hoffman no  plantar response down  plantar response down   COORDINATION/GAIT:   Gait narrow based and stable.   Data: EMG 12/15/2013: 1. Chronic C6 radiculopathy affecting the left upper extremity. 2. Left ulnar neuropathy with slowing across the elbow, purely demyelinating in type. 3. Multilevel intraspinal canal lesion affecting L2-S1 myotomes bilaterally. These findings are moderate in degree electrically and worse on the left side involving the L2-L4 myotome. Active changes are also seen affecting the medial gastrocnemius muscle. These findings are insufficient for the diagnosis of motor neuron disease, recommend repeat electrodiagnostic testing in 6 months if clinically indicated.  Labs 11/25/2013:  CK 215, aldolase 9.2*, PTH 34, SPEP/UPEP with IFE no Mo protein, copper 116, zinc 67, CRP 0.6*, ESR 47  CT lumbar spine 12/17/2013: 1. Severe degenerative disc disease with disc height loss at L5-S1 with a mild broad-based disc osteophyte complex and right foraminal narrowing. 2. Mild broad-based disc bulge at L4-5 without foraminal or central canal stenosis.   IMPRESSION/PLAN: Mr. Kina is a 54 year-old gentleman returning for evaluation of left leg fasciculations, weakness, and painful cramps. His exam shows improved left leg strength with only subtle left hip extension weakness.  He continues to have left-sided fasciculations, and asymmetrical reflexes, with brisk patellar knee jerk. Work-up thus far include lab testing screening for myopathy and motor neuron disease, which is essentially normal.  EMG of the left arm and leg shows chronic multilevel intraspinal canal lesions affecting the lumbosacral region, with active changes isolated to medial gastronemius.  Findings are insufficient for electrodiagnostic diagnosis of motor neuron disease. CT imaging of his lumbar spine shows severe disc degeneration with mild foraminal stenosis on the right at L5-S1, which would not  explain his symptoms.    Given improvement in his motor strength, I would like to follow him clinically, but cannot dismiss possibility of early MND.   From a symptomatic management, I will increase baclofen to 48m in the morning and 372mat bedtime. For severe pain, ok to take tramadol 5090mhs prn  Return to clinic in 4-6 months, or sooner as needed  The duration of this appointment visit was 25 minutes of face-to-face time with the patient.  Greater than 50% of this time was spent in counseling, explanation of diagnosis, planning of further management, and coordination of care.   Thank you for allowing me to participate in patient's care.  If I can answer any additional questions, I would be pleased to do so.    Sincerely,    Donika K. PatPosey ProntoO

## 2014-01-31 DIAGNOSIS — N12 Tubulo-interstitial nephritis, not specified as acute or chronic: Secondary | ICD-10-CM | POA: Diagnosis not present

## 2014-01-31 DIAGNOSIS — N200 Calculus of kidney: Secondary | ICD-10-CM | POA: Diagnosis not present

## 2014-01-31 DIAGNOSIS — Q6410 Exstrophy of urinary bladder, unspecified: Secondary | ICD-10-CM | POA: Diagnosis not present

## 2014-01-31 DIAGNOSIS — Q640 Epispadias: Secondary | ICD-10-CM | POA: Diagnosis not present

## 2014-03-16 ENCOUNTER — Other Ambulatory Visit: Payer: Self-pay | Admitting: *Deleted

## 2014-03-16 MED ORDER — PRAVASTATIN SODIUM 40 MG PO TABS: 40.0000 mg | ORAL_TABLET | Freq: Every day | ORAL | Status: DC

## 2014-03-21 DIAGNOSIS — Z23 Encounter for immunization: Secondary | ICD-10-CM | POA: Diagnosis not present

## 2014-04-11 ENCOUNTER — Telehealth: Payer: Self-pay | Admitting: Neurology

## 2014-04-11 NOTE — Telephone Encounter (Signed)
There is still room to increase his baclofen, tell him to start taking 2 tablets in the morning, 1 in the afternoon, and 3 tablets at bedtime.  Also, please have him reschedule his f/u to a sooner date, so I can reassess.  Donika K. Posey Pronto, DO

## 2014-04-11 NOTE — Telephone Encounter (Signed)
Pt called wanting to let Dr. Posey Pronto know that his spasms on his left leg is getting worse and spasms are starting on his right leg. Pt would like to know what he can do. C/b [phone]

## 2014-04-11 NOTE — Telephone Encounter (Signed)
Please advise 

## 2014-04-11 NOTE — Telephone Encounter (Signed)
Made patient aware of the recommended increase Baclofen. Patient also rescheduled his follow up appointment 4/26

## 2014-04-13 ENCOUNTER — Ambulatory Visit (INDEPENDENT_AMBULATORY_CARE_PROVIDER_SITE_OTHER): Payer: Medicare Other | Admitting: Family

## 2014-04-13 ENCOUNTER — Encounter: Payer: Self-pay | Admitting: Family

## 2014-04-13 VITALS — BP 132/90 | HR 53 | Temp 98.1°F | Resp 18 | Ht 70.0 in | Wt 172.4 lb

## 2014-04-13 DIAGNOSIS — R293 Abnormal posture: Secondary | ICD-10-CM | POA: Diagnosis not present

## 2014-04-13 MED ORDER — NAPROXEN 500 MG PO TABS: 500.0000 mg | ORAL_TABLET | Freq: Two times a day (BID) | ORAL | Status: DC

## 2014-04-13 NOTE — Telephone Encounter (Signed)
Please review, is encounter ready to be closed? / Andrew S.

## 2014-04-13 NOTE — Progress Notes (Signed)
Subjective:    Patient ID: Andrew Joyce, male    DOB: 14-Aug-1960, 53 y.o.   MRN: [id number]  Chief Complaint  Patient presents with  . Back Pain    having pain in the right side of his back, started in september but not as frequent, but as the months go on its going on all the time, does not feel like a pulled muscle, has had those before    HPI:  Andrew Joyce is a 54 y.o. male who presents today for an office visit.   Associated symptom of pain located on the ride side of his thoracic spine has been going on since September. Indicates that the pain has gradually worsened over time. Recent CT of his lumbar spine showed mild disc bulge at L4-5. Pain is described as dull and achy and ranges to as high as 8.5/10 at times. Modifying factors include OTC NSAIDs which have provided minimal relief. Laying down and turning to the left side alleviates the pain until he stands back up. He is currently using baclofen for his left calf. Indicates the symptoms occur mainly when doing the house work.  No Known Allergies  Current Outpatient Prescriptions on File Prior to Visit  Medication Sig Dispense Refill  . amLODipine (NORVASC) 5 MG tablet Take 1 tablet (5 mg total) by mouth daily. 30 tablet 6  . aspirin 81 MG tablet Take 1 tablet (81 mg total) by mouth daily.    . baclofen (LIORESAL) 10 MG tablet Take 1 tab in the morning and 3 tab at bedtime 90 each 5  . clopidogrel (PLAVIX) 75 MG tablet Take 75 mg by mouth daily with breakfast.    . fenofibrate 54 MG tablet Take 1 tablet (54 mg total) by mouth daily. 30 tablet 6  . fish oil-omega-3 fatty acids 1000 MG capsule Take 2 capsules (2 g total) by mouth 2 (two) times daily.    . isosorbide mononitrate (IMDUR) 60 MG 24 hr tablet TAKE ONE & ONE-HALF TABLETS BY MOUTH ONCE DAILY 45 tablet 6  . losartan (COZAAR) 100 MG tablet Take 1 tablet (100 mg total) by mouth daily. 30 tablet 6  . metoprolol (LOPRESSOR) 50 MG tablet Take 1 tablet (50 mg  total) by mouth 2 (two) times daily. 60 tablet 6  . nitroGLYCERIN (NITROSTAT) 0.4 MG SL tablet Place 1 tablet (0.4 mg total) under the tongue every 5 (five) minutes as needed. For chest pain 25 tablet 3  . pravastatin (PRAVACHOL) 40 MG tablet Take 1 tablet (40 mg total) by mouth daily. 30 tablet 1  . traMADol (ULTRAM) 50 MG tablet Take 1 tablet (50 mg total) by mouth at bedtime as needed for moderate pain. 30 tablet 4   No current facility-administered medications on file prior to visit.    Past Medical History  Diagnosis Date  . Coronary artery disease     a. s/p CABG in 2008; b.  s/p cath in 2011 for med rx.;  c.  NSTEMI (02/2011):  LHC (02/2011):  LAD occluded, distal LAD filled via left to left collaterals, distal CFX 40-50%, proximal RCA occluded (right to right collaterals), distal RCA occluded, S-RCA occluded proximally, S-OM1/OM2 occluded (new from 2011 - culprit), L-LAD ok, ant apical HK, EF 45-50% - med Rx.  [name] Dyslipidemia   . Hypertension   . Recurrent UTI   . History of renal stone   . Bladder exstrophy      Congenital- had multiple surgeries as a child  .  Chronic kidney disease (CKD), stage II (mild)   . Pancreatitis   . Hx of echocardiogram     Echo (05/2013):  EF 55-60%, inf HK, mild AI, normal RVSF, RVSP 28 mmHg  . Hx of cardiovascular stress test     Nuclear (06/2009):  EF 71%, small area of infarct/ischemia in inferior wall-unchanged from prior    Review of Systems  Musculoskeletal: Positive for back pain. Negative for neck pain.  Neurological: Negative for numbness.      Objective:    BP 132/90 mmHg  Pulse 53  Temp(Src) 98.1 F (36.7 C) (Oral)  Resp 18  Ht 5\' 10"  (1.778 m)  Wt 172 lb 6.4 oz (78.2 kg)  BMI 24.74 kg/m2  SpO2 98% Nursing note and vital signs reviewed.  Physical Exam  Constitutional: He is oriented to person, place, and time. He appears well-developed and well-nourished. No distress.  Cardiovascular: Normal rate, regular rhythm, normal heart  sounds and intact distal pulses.   Pulmonary/Chest: Effort normal and breath sounds normal.  Musculoskeletal:  No obvious deformity, discoloration, or edema of thoracic or lumbar spine noted. Patient displays full range of motion in all directions. No palpable tenderness able to be elicited. Patient indicates when pain occurs it is located inferior and medial to his right scapula. Shoulder range of motion is intact and appropriate neck range of motion is intact and appropriate. Distal pulses and sensation is intact and appropriate.  Neurological: He is alert and oriented to person, place, and time.  Skin: Skin is warm and dry.  Psychiatric: He has a normal mood and affect. His behavior is normal. Judgment and thought content normal.       Assessment & Plan:

## 2014-04-13 NOTE — Progress Notes (Signed)
Pre visit review using our clinic review tool, if applicable. No additional management support is needed unless otherwise documented below in the visit note. 

## 2014-04-13 NOTE — Patient Instructions (Signed)
Thank you for choosing [facility].  Summary/Instructions:  Your prescription(s) have been submitted to your pharmacy or been printed and provided for you. Please take as directed and contact our office if you believe you are having problem(s) with the medication(s) or have any questions.  Referrals have been made during this visit. You should expect to hear back from our schedulers in about 7-10 days in regards to establishing an appointment with the specialists we discussed.   If your symptoms worsen or fail to improve, please contact our office for further instruction, or in case of emergency go directly to the emergency room at the closest medical facility.    Please take the naproxen 2 times a day for the next 5 days and then as needed.

## 2014-04-13 NOTE — Assessment & Plan Note (Signed)
Patient presents with thoracic spine pain which is most likely due to postural imbalance/changes. Given length of time conservative treatment appears to be unsuccessful. An progressively worsening with postural imbalance. Refer to physical therapy for evaluation and treatment. Start Naprosyn as needed. Follow-up if symptoms worsen or fail to improve.

## 2014-04-15 ENCOUNTER — Encounter: Payer: Self-pay | Admitting: Cardiology

## 2014-04-15 ENCOUNTER — Ambulatory Visit (INDEPENDENT_AMBULATORY_CARE_PROVIDER_SITE_OTHER): Payer: Medicare Other | Admitting: Cardiology

## 2014-04-15 VITALS — BP 128/88 | HR 54 | Ht 70.0 in | Wt 173.2 lb

## 2014-04-15 DIAGNOSIS — N182 Chronic kidney disease, stage 2 (mild): Secondary | ICD-10-CM

## 2014-04-15 DIAGNOSIS — I10 Essential (primary) hypertension: Secondary | ICD-10-CM | POA: Diagnosis not present

## 2014-04-15 DIAGNOSIS — E785 Hyperlipidemia, unspecified: Secondary | ICD-10-CM | POA: Diagnosis not present

## 2014-04-15 DIAGNOSIS — I25119 Atherosclerotic heart disease of native coronary artery with unspecified angina pectoris: Secondary | ICD-10-CM | POA: Diagnosis not present

## 2014-04-15 NOTE — Progress Notes (Signed)
Cardiology Office Note   Date:  04/15/2014   ID:  Andrew Joyce, DOB 28-Jun-1960, MRN [id number]  PCP:  Mauricio Po, FNP  Cardiologist:  Dr. Azaiah Licciardi [place]      History of Present Illness: Andrew Joyce is a 54 y.o. male seen for follow up CAD. He has a hx of CAD s/p CABG in 2008, HTN, HL, CKD.  He suffered a non-STEMI in February 2013. Culprit was felt to be an occluded SVG-OM1/OM2. Native circumflex had 40-50% distal stenosis. There were no targets for revascularization and he was treated medically.   He has a history orthostatic intolerance. Imdur dose was reduced.     Echo was obtained and demonstrated EF 55-60%, mild inferior hypokinesis, top normal LA/RA size.Poorly visualized aortic valve, however, there is mild central AI - no stenosis.    On follow up today he still notes that if he gets up quickly everything goes black.is doing well from a cardiac standpoint. He has no chest pain or SOB. He stays active. He does complain of involuntary twitching of his legs. He had extensive Neuro evaluation without clear cause. Now on baclofen. He also complains of low back pain. CT of the spine showed DJD and some disc bulging but no nerve root compression.    Recent Labs: 06/01/2013: ALT 26; Hemoglobin 13.8; TSH 3.44 10/15/2013: Creatinine 1.3; Potassium 4.0  Wt Readings from Last 3 Encounters:  04/15/14 173 lb 3 oz (78.557 kg)  04/13/14 172 lb 6.4 oz (78.2 kg)  01/25/14 171 lb 6 oz (77.735 kg)     Past Medical History  Diagnosis Date  . Coronary artery disease     a. s/p CABG in 2008; b.  s/p cath in 2011 for med rx.;  c.  NSTEMI (02/2011):  LHC (02/2011):  LAD occluded, distal LAD filled via left to left collaterals, distal CFX 40-50%, proximal RCA occluded (right to right collaterals), distal RCA occluded, S-RCA occluded proximally, S-OM1/OM2 occluded (new from 2011 - culprit), L-LAD ok, ant apical HK, EF 45-50% - med Rx.  [name] Dyslipidemia   . Hypertension   . Recurrent  UTI   . History of renal stone   . Bladder exstrophy      Congenital- had multiple surgeries as a child  . Chronic kidney disease (CKD), stage II (mild)   . Pancreatitis   . Hx of echocardiogram     Echo (05/2013):  EF 55-60%, inf HK, mild AI, normal RVSF, RVSP 28 mmHg  . Hx of cardiovascular stress test     Nuclear (06/2009):  EF 71%, small area of infarct/ischemia in inferior wall-unchanged from prior    Current Outpatient Prescriptions  Medication Sig Dispense Refill  . amLODipine (NORVASC) 5 MG tablet Take 1 tablet (5 mg total) by mouth daily. 30 tablet 6  . aspirin 81 MG tablet Take 1 tablet (81 mg total) by mouth daily.    . baclofen (LIORESAL) 10 MG tablet Take 1 tab in the morning and 3 tab at bedtime 90 each 5  . clopidogrel (PLAVIX) 75 MG tablet Take 75 mg by mouth daily with breakfast.    . fenofibrate 54 MG tablet Take 1 tablet (54 mg total) by mouth daily. 30 tablet 6  . fish oil-omega-3 fatty acids 1000 MG capsule Take 2 capsules (2 g total) by mouth 2 (two) times daily.    . isosorbide mononitrate (IMDUR) 60 MG 24 hr tablet TAKE ONE & ONE-HALF TABLETS BY MOUTH ONCE DAILY 45 tablet 6  .  losartan (COZAAR) 100 MG tablet Take 1 tablet (100 mg total) by mouth daily. 30 tablet 6  . metoprolol (LOPRESSOR) 50 MG tablet Take 1 tablet (50 mg total) by mouth 2 (two) times daily. 60 tablet 6  . naproxen (NAPROSYN) 500 MG tablet Take 1 tablet (500 mg total) by mouth 2 (two) times daily with a meal. 60 tablet 0  . nitroGLYCERIN (NITROSTAT) 0.4 MG SL tablet Place 1 tablet (0.4 mg total) under the tongue every 5 (five) minutes as needed. For chest pain 25 tablet 3  . pravastatin (PRAVACHOL) 40 MG tablet Take 1 tablet (40 mg total) by mouth daily. 30 tablet 1  . traMADol (ULTRAM) 50 MG tablet Take 1 tablet (50 mg total) by mouth at bedtime as needed for moderate pain. 30 tablet 4   No current facility-administered medications for this visit.    Allergies:   Review of patient's allergies  indicates no known allergies.   Social History:  The patient  reports that he has never smoked. He does not have any smokeless tobacco history on file. He reports that he does not drink alcohol or use illicit drugs.   Family History:  The patient's family history includes Healthy in his brother and sister; Hyperlipidemia in his father and mother; Hypertension in his father and mother.   ROS:  Please see the history of present illness.     All other systems reviewed and negative.   PHYSICAL EXAM: VS:  BP 128/88 mmHg  Pulse 54  Ht 5\' 10"  (1.778 m)  Wt 173 lb 3 oz (78.557 kg)  BMI 24.85 kg/m2  Well nourished, well developed, in no acute distress HEENT: normal Neck: no JVD Cardiac:  normal S1, S2; RRR; no murmur Lungs:  clear to auscultation bilaterally, no wheezing, rhonchi or rales Abd: soft, nontender, no hepatomegaly Ext: no edema Skin: warm and dry Neuro:  CNs 2-12 intact, no focal abnormalities noted    ASSESSMENT AND PLAN:  1. Orthostatic intolerance: he is now asymptomatic. 2. CAD (coronary artery disease):  Continue aspirin, Plavix, nitrates, beta blocker. Asymptomatic.  3. HTN (hypertension):  Controlled.  4. Hyperlipidemia:   Continue statin. Recommend lipid panel with next lab work. 5. CKD (chronic kidney disease) stage 2, GFR 60-89 ml/min:  Recent creatinine stable.  I will follow up in 6 months.

## 2014-04-15 NOTE — Patient Instructions (Signed)
Continue your current therapy  I will see you in 6 months.   

## 2014-04-21 ENCOUNTER — Telehealth: Payer: Self-pay | Admitting: Family

## 2014-04-21 NOTE — Telephone Encounter (Signed)
Patient was prescribed naproxen (NAPROSYN) 500 MG tablet [[id number]] on his last visit. He seen his cardiologist and he advised him not to take it due to his heart and kidney issues. He just wanted to let you know.

## 2014-04-21 NOTE — Telephone Encounter (Signed)
Noted  

## 2014-04-25 ENCOUNTER — Other Ambulatory Visit: Payer: Self-pay | Admitting: *Deleted

## 2014-04-25 ENCOUNTER — Telehealth: Payer: Self-pay | Admitting: *Deleted

## 2014-04-25 MED ORDER — BACLOFEN 10 MG PO TABS: ORAL_TABLET | ORAL | Status: DC

## 2014-04-25 NOTE — Telephone Encounter (Signed)
Rx sent in.  Patient is taking med differently now so amount had to be increased.

## 2014-04-25 NOTE — Telephone Encounter (Signed)
Patient has questions about a RX refill Call back [phone]

## 2014-05-03 ENCOUNTER — Ambulatory Visit (INDEPENDENT_AMBULATORY_CARE_PROVIDER_SITE_OTHER): Payer: Medicare Other | Admitting: Neurology

## 2014-05-03 ENCOUNTER — Encounter: Payer: Self-pay | Admitting: Neurology

## 2014-05-03 VITALS — BP 140/80 | HR 58 | Wt 170.4 lb

## 2014-05-03 DIAGNOSIS — G8220 Paraplegia, unspecified: Secondary | ICD-10-CM | POA: Diagnosis not present

## 2014-05-03 DIAGNOSIS — R253 Fasciculation: Secondary | ICD-10-CM | POA: Diagnosis not present

## 2014-05-03 DIAGNOSIS — M5417 Radiculopathy, lumbosacral region: Secondary | ICD-10-CM

## 2014-05-03 DIAGNOSIS — I25119 Atherosclerotic heart disease of native coronary artery with unspecified angina pectoris: Secondary | ICD-10-CM | POA: Diagnosis not present

## 2014-05-03 DIAGNOSIS — M549 Dorsalgia, unspecified: Secondary | ICD-10-CM | POA: Diagnosis not present

## 2014-05-03 MED ORDER — DICLOFENAC SODIUM 1 % TD GEL: TRANSDERMAL | Status: DC

## 2014-05-03 MED ORDER — LIDOCAINE 5 % EX PTCH: 1.0000 | MEDICATED_PATCH | CUTANEOUS | Status: DC

## 2014-05-03 NOTE — Progress Notes (Signed)
Follow-up Visit   Date: 05/03/2014    Andrew Joyce MRN: [id number] DOB: 10-19-1960   Interim History: Andrew Joyce is a 54 y.o. left-handed Caucasian male with congenital exstrophy status post multiple surgeries complicated by chronic UTI, CAD s/p CABG (2008), hyperlipidemia, hypertension, and stage II CKD returning to the clinic for follow-up of left leg twitches and weakness.   History of present illness: Starting in October 2015, he developed "firing nerves" and cramping of the left legs. Symptoms are worse at night and keep him from sleeping because of painful cramps. They usually last several minutes. He has no associated weakness or similar symptoms on the right leg or arms. He went to see his PCP who recommended melatonin, muscle relaxants (robaxin), ambien, and heat packs. He also tried taking valium at night which did not help much. He says that his legs are "agitated". There is no burning, tingling.   Follow-up 12/15/2013: He feels that the agitation of the leg and cramps has improved since starting baclofen and he is able to sleep better through the night.  There has been no change in his leg weakness or twitches.  He has noticed intermittent cramping of the left hand, especially when driving.  Denies problems with swallowing, talking, or shortness of breath. No recent falls.  He denies any pain of his low back.  Here is here to discuss EMG results.  UPDATE 01/25/2014:  He reports having cramps about 3-4 times of the night.  He tried his family member's tramadol which completely alleviated his cramps.  He has not noticed left leg weakness to be a problem, but his muscle twitches remain unchanged.   UPDATE 05/03/2014:   Since his last visit, he reports noticing new twitches in his right lower leg, but has not noticed any weakness. He reports having new right mid-back pain, described as severe and excruciating ache especially with activity, such as washing  dishes, folding clothes, playing the piano, etc.  He has severe achy and soreness of his back.  He tried ibuprofen which did not alleviate the pain.  It is improved when he lays down, but easily aggravated by activity.     Medications:  Current Outpatient Prescriptions on File Prior to Visit  Medication Sig Dispense Refill  . amLODipine (NORVASC) 5 MG tablet Take 1 tablet (5 mg total) by mouth daily. 30 tablet 6  . aspirin 81 MG tablet Take 1 tablet (81 mg total) by mouth daily.    . baclofen (LIORESAL) 10 MG tablet Take 10 mg by mouth 3 (three) times daily. Take 2 tablets in the morning, 1 at lunchtime and 3 at bedtime.    . baclofen (LIORESAL) 10 MG tablet Take 1 tab in the morning and 3 tab at bedtime 180 each 5  . clopidogrel (PLAVIX) 75 MG tablet Take 75 mg by mouth daily with breakfast.    . doxycycline (VIBRA-TABS) 100 MG tablet Take 100 mg by mouth 2 (two) times daily.  0  . fenofibrate 54 MG tablet Take 1 tablet (54 mg total) by mouth daily. 30 tablet 6  . fish oil-omega-3 fatty acids 1000 MG capsule Take 2 capsules (2 g total) by mouth 2 (two) times daily.    . isosorbide mononitrate (IMDUR) 60 MG 24 hr tablet TAKE ONE & ONE-HALF TABLETS BY MOUTH ONCE DAILY 45 tablet 6  . losartan (COZAAR) 100 MG tablet Take 1 tablet (100 mg total) by mouth daily. 30 tablet 6  . metoprolol (LOPRESSOR)  50 MG tablet Take 1 tablet (50 mg total) by mouth 2 (two) times daily. 60 tablet 6  . naproxen (NAPROSYN) 500 MG tablet Take 1 tablet (500 mg total) by mouth 2 (two) times daily with a meal. 60 tablet 0  . nitrofurantoin (MACRODANTIN) 100 MG capsule Take 100 mg by mouth at bedtime.  11  . nitroGLYCERIN (NITROSTAT) 0.4 MG SL tablet Place 1 tablet (0.4 mg total) under the tongue every 5 (five) minutes as needed. For chest pain 25 tablet 3  . pravastatin (PRAVACHOL) 40 MG tablet Take 1 tablet (40 mg total) by mouth daily. 30 tablet 1  . traMADol (ULTRAM) 50 MG tablet Take 1 tablet (50 mg total) by mouth at  bedtime as needed for moderate pain. 30 tablet 4   No current facility-administered medications on file prior to visit.    Allergies: No Known Allergies  Review of Systems:  CONSTITUTIONAL: No fevers, chills, night sweats, or weight loss.  EYES: No visual changes or eye pain ENT: No hearing changes.  No history of nose bleeds.   RESPIRATORY: No cough, wheezing and shortness of breath.   CARDIOVASCULAR: Negative for chest pain, and palpitations.   GI: Negative for abdominal discomfort, blood in stools or black stools.  No recent change in bowel habits.   GU:  No history of incontinence.   MUSCLOSKELETAL: No history of joint pain or swelling.  No myalgias.   SKIN: Negative for lesions, rash, and itching.   ENDOCRINE: Negative for cold or heat intolerance, polydipsia or goiter.   PSYCH:  No depression or anxiety symptoms.   NEURO: As Above.   Vital Signs:  BP 140/80 mmHg  Pulse 58  Wt 170 lb 6 oz (77.282 kg)  SpO2 98%   Neurological Exam: MENTAL STATUS including orientation to time, place, person, recent and remote memory, attention span and concentration, language, and fund of knowledge is normal.  Speech is not dysarthric.  CRANIAL NERVES:  Pupils equal round and reactive to light.  Normal conjugate, extra-ocular eye movements in all directions of gaze.  No ptosis. Normal facial sensation.  Face is symmetric. Palate elevates symmetrically.  Tongue is midline.  MOTOR:  Moderate atrophy of the left lower leg (quads, medial gastroc). Prominent and active fasciculations of the left > right calf. normal tone.   Right Upper Extremity:    Left Upper Extremity:    Deltoid  5/5   Deltoid  5/5   Biceps  5/5   Biceps  5/5   Triceps  5/5   Triceps  5/5   Wrist extensors  5/5   Wrist extensors  5/5   Wrist flexors  5/5   Wrist flexors  5/5   Finger extensors  5/5   Finger extensors  5/5   Finger flexors  5/5   Finger flexors  5/5   Dorsal interossei  5/5   Dorsal interossei  5/5     Abductor pollicis  5/5   Abductor pollicis  5/5   Tone (Ashworth scale)  0  Tone (Ashworth scale)  0   Right Lower Extremity:    Left Lower Extremity:    Hip flexors  5-/5   Hip flexors  4+/5   Hip extensors  5/5   Hip extensors  5-/5   Knee flexors  5-/5   Knee flexors  4+/5   Knee extensors  5/5   Knee extensors  5-/5   Dorsiflexors  5/5   Dorsiflexors  4+/5   Plantarflexors  5/5  Plantarflexors  5/5   Toe extensors  5/5   Toe extensors  4+/5   Toe flexors  5/5   Toe flexors  5/5   Tone (Ashworth scale)  0  Tone (Ashworth scale)  0   MSRs:  Right                                                                 Left brachioradialis 1+  brachioradialis 1+  biceps 1+  biceps 1+  triceps 1+  triceps 1+  patellar 2+  patellar 3+  ankle jerk 2+  ankle jerk 1+  Hoffman no  Hoffman no  plantar response down  plantar response down   COORDINATION/GAIT:   Gait narrow based and stable. Stressed gait intact.  Data: EMG 12/15/2013: 1. Chronic C6 radiculopathy affecting the left upper extremity. 2. Left ulnar neuropathy with slowing across the elbow, purely demyelinating in type. 3. Multilevel intraspinal canal lesion affecting L2-S1 myotomes bilaterally. These findings are moderate in degree electrically and worse on the left side involving the L2-L4 myotome. Active changes are also seen affecting the medial gastrocnemius muscle. These findings are insufficient for the diagnosis of motor neuron disease, recommend repeat electrodiagnostic testing in 6 months if clinically indicated.  Labs 11/25/2013:  CK 215, aldolase 9.2*, PTH 34, SPEP/UPEP with IFE no Mo protein, copper 116, zinc 67, CRP 0.6*, ESR 47  CT lumbar spine 12/17/2013: 1. Severe degenerative disc disease with disc height loss at L5-S1 with a mild broad-based disc osteophyte complex and right foraminal narrowing. 2. Mild broad-based disc bulge at L4-5 without foraminal or central canal stenosis.   IMPRESSION/PLAN: Andrew Joyce is a 55 year-old gentleman returning for evaluation of left leg fasciculations, weakness, and painful cramps.  His exam shows worsening bilateral leg strength (left > right) with new right leg fasciculations. I am most concerned about early motor neuron disease.  EMG of the left arm and leg shows chronic multilevel intraspinal canal lesions affecting the lumbosacral region, with active changes isolated to medial gastronemius.  Findings are insufficient for electrodiagnostic diagnosis of motor neuron disease.    He is also complaining of new localized right mid-back pain, which limits his daily activities.  There is no tenderness to palpation, but he characterizes it as severe.  With his new back pain, worsening leg weakness, and evolving fasciculations of the legs, I will order MRI thoracic and lumbar spine to look for a structural lesion.  For his pain, he can try voltaren ointment to back, as to avoid systemic NSAIDs.  Rx for lidocaine patch was also given.  Muscle cramps are well-controlled on baclofen 68m, 112m and 3070mhs.  For severe pain, ok to take tramadol 66m40ms prn  Return to clinic in 6 weeks, or sooner as needed  The duration of this appointment visit was 30 minutes of face-to-face time with the patient.  Greater than 50% of this time was spent in counseling, explanation of diagnosis, planning of further management, and coordination of care.   Thank you for allowing me to participate in patient's care.  If I can answer any additional questions, I would be pleased to do so.    Sincerely,    Graviel Payeur K. PatePosey Pronto

## 2014-05-03 NOTE — Patient Instructions (Signed)
1.  MRI thoracic and lumbar spine without contrast 2.  Start using Voltaren ointment to back 3.  Start using lidocaine patch to your back, if this is too expensive, do not pick it up.   4.  Return to clinic in 6 weeks

## 2014-05-13 ENCOUNTER — Other Ambulatory Visit: Payer: Self-pay

## 2014-05-13 MED ORDER — PRAVASTATIN SODIUM 40 MG PO TABS: 40.0000 mg | ORAL_TABLET | Freq: Every day | ORAL | Status: DC

## 2014-05-14 ENCOUNTER — Ambulatory Visit
Admission: RE | Admit: 2014-05-14 | Discharge: 2014-05-14 | Disposition: A | Discharge status: Home / Self Care | Payer: Medicare Other | Source: Ambulatory Visit | Attending: Neurology | Admitting: Neurology

## 2014-05-14 DIAGNOSIS — G8220 Paraplegia, unspecified: Secondary | ICD-10-CM

## 2014-05-14 DIAGNOSIS — M5417 Radiculopathy, lumbosacral region: Secondary | ICD-10-CM

## 2014-05-14 DIAGNOSIS — R253 Fasciculation: Secondary | ICD-10-CM

## 2014-05-24 ENCOUNTER — Other Ambulatory Visit: Payer: Self-pay | Admitting: Cardiology

## 2014-05-24 ENCOUNTER — Other Ambulatory Visit: Payer: Self-pay | Admitting: *Deleted

## 2014-05-24 MED ORDER — METOPROLOL TARTRATE 50 MG PO TABS: 50.0000 mg | ORAL_TABLET | Freq: Two times a day (BID) | ORAL | Status: DC

## 2014-05-25 ENCOUNTER — Telehealth: Payer: Self-pay | Admitting: Neurology

## 2014-05-25 NOTE — Telephone Encounter (Signed)
Pt has some questions about medication please call [phone]

## 2014-05-25 NOTE — Telephone Encounter (Signed)
Called patient back and left message for him to call me back.

## 2014-05-25 NOTE — Telephone Encounter (Signed)
Pt returning call to ashley °

## 2014-05-25 NOTE — Telephone Encounter (Signed)
Patient called stating that he must have picked up Rx with old instructions.  I called [facility] and gave them the new instructions.  2 po qam, 1 at lunch, 3 po qhs

## 2014-05-29 ENCOUNTER — Ambulatory Visit
Admission: RE | Admit: 2014-05-29 | Discharge: 2014-05-29 | Disposition: A | Discharge status: Home / Self Care | Payer: Medicare Other | Source: Ambulatory Visit | Attending: Neurology | Admitting: Neurology

## 2014-05-29 DIAGNOSIS — M5137 Other intervertebral disc degeneration, lumbosacral region: Secondary | ICD-10-CM | POA: Diagnosis not present

## 2014-05-29 DIAGNOSIS — M4806 Spinal stenosis, lumbar region: Secondary | ICD-10-CM | POA: Diagnosis not present

## 2014-05-29 DIAGNOSIS — M4727 Other spondylosis with radiculopathy, lumbosacral region: Secondary | ICD-10-CM | POA: Diagnosis not present

## 2014-05-29 DIAGNOSIS — M5126 Other intervertebral disc displacement, lumbar region: Secondary | ICD-10-CM | POA: Diagnosis not present

## 2014-05-29 DIAGNOSIS — M5124 Other intervertebral disc displacement, thoracic region: Secondary | ICD-10-CM | POA: Diagnosis not present

## 2014-05-31 ENCOUNTER — Ambulatory Visit: Payer: Medicare Other | Attending: Family

## 2014-05-31 DIAGNOSIS — R269 Unspecified abnormalities of gait and mobility: Secondary | ICD-10-CM | POA: Insufficient documentation

## 2014-05-31 DIAGNOSIS — R29898 Other symptoms and signs involving the musculoskeletal system: Secondary | ICD-10-CM | POA: Insufficient documentation

## 2014-05-31 DIAGNOSIS — M546 Pain in thoracic spine: Secondary | ICD-10-CM | POA: Diagnosis not present

## 2014-05-31 NOTE — Therapy (Signed)
[facility] [address] [place], [place], [address] Phone: [phone]   Fax:  [phone]  Physical Therapy Evaluation  Patient Details  Name: Andrew Joyce MRN: [id number] Date of Birth: [birth year] Referring Provider:  Golden Circle, FNP  Encounter Date: 05/31/2014      PT End of Session - 05/31/14 1708    Visit Number 1   Number of Visits 9   Date for PT Re-Evaluation 06/30/14   Authorization Type G-code every 10th visit.   PT Start Time 1315   PT Stop Time 1358   PT Time Calculation (min) 43 min   Activity Tolerance Patient tolerated treatment well   Behavior During Therapy WFL for tasks assessed/performed      Past Medical History  Diagnosis Date  . Coronary artery disease     a. s/p CABG in 2008; b.  s/p cath in 2011 for med rx.;  c.  NSTEMI (02/2011):  LHC (02/2011):  LAD occluded, distal LAD filled via left to left collaterals, distal CFX 40-50%, proximal RCA occluded (right to right collaterals), distal RCA occluded, S-RCA occluded proximally, S-OM1/OM2 occluded (new from 2011 - culprit), L-LAD ok, ant apical HK, EF 45-50% - med Rx.  [name] Dyslipidemia   . Hypertension   . Recurrent UTI   . History of renal stone   . Bladder exstrophy      Congenital- had multiple surgeries as a child  . Chronic kidney disease (CKD), stage II (mild)   . Pancreatitis   . Hx of echocardiogram     Echo (05/2013):  EF 55-60%, inf HK, mild AI, normal RVSF, RVSP 28 mmHg  . Hx of cardiovascular stress test     Nuclear (06/2009):  EF 71%, small area of infarct/ischemia in inferior wall-unchanged from prior    Past Surgical History  Procedure Laterality Date  . Cardiac catheterization  07/28/2009    EF 60%; Grafts patent except for SVG to the AM and PD. Native RCA is occluded.  . Cardiac catheterization  03/20/2006    EF 60-65%  . Coronary artery bypass graft  03/2006    LIMA GRAFT TO LAD, SAPHENOUS VEIN GRAFT TO THE  ACUTE MARGINAL BRANCH THE RIGHT CORONARY, SAPHENOUS VEIN GRAFT TO THE PDA, SEQUENTIAL VEIN GRAFT TO THE FIRST AND SECOND OBTUSE MARGINAL VESSELS  . Cardiovascular stress test  06/12/2009    EF 71%, SMALL AREA OF INFARCT/ISCHEMIA IN INFERIOR WALL; FELT TO NOT BE CHANGED.  [name] Left heart catheterization with coronary angiogram N/A 02/26/2011    Procedure: LEFT HEART CATHETERIZATION WITH CORONARY ANGIOGRAM;  Surgeon: Thayer Headings, MD;  Location: [facility] CATH LAB;  Service: Cardiovascular;  Laterality: N/A;    There were no vitals filed for this visit.  Visit Diagnosis:  Right-sided thoracic back pain  Left leg weakness  Abnormality of gait      Subjective Assessment - 05/31/14 1324    Subjective back pain while bending forward, while using B UEs   Pertinent History B LE muscle spasms, currently undergoing testing for motor neuron disease, MI, HTN, CKD, UTIs   How long can you stand comfortably? with arm extended for 5-6 minutes before pain starts (washing dishes, etc)   Patient Stated Goals to get rid of back pain   Currently in Pain? Yes   Pain Score 2    Pain Location Back   Pain Orientation Right   Pain Descriptors / Indicators Aching  severe ache   Pain Type Chronic pain  12/2013   Pain Onset More than a month ago   Pain Frequency Intermittent   Aggravating Factors  trunk flexion   Pain Relieving Factors lying down  0/10 when resting, 8-9/10 at its worst            [facility] PT Assessment - 05/31/14 1327    Assessment   Medical Diagnosis Postural imbalance   Onset Date/Surgical Date 12/07/13   Prior Therapy none for back/LE.   Precautions   Precautions None   Restrictions   Weight Bearing Restrictions No   Balance Screen   Has the patient fallen in the past 6 months No   Has the patient had a decrease in activity level because of a fear of falling?  No   Is the patient reluctant to leave their home because of a fear of falling?  No   Home Environment   Living Environment  Private residence   Living Arrangements Spouse/significant other;Children   Available Help at Discharge Family   Type of [place] to enter   Entrance Stairs-Number of Steps 2   Entrance Stairs-Rails None   Home Layout Two level  pt's bedroom on second floor   Alternate Level Stairs-Number of Steps 12   Alternate Level Stairs-Rails Right   Home Equipment None   Prior Function   Level of Independence Independent with basic ADLs;Independent with gait;Independent with transfers;Independent with homemaking with ambulation   Vocation Full time employment   Vocation Requirements household chores, 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   Coordination   Gross Motor Movements are Fluid and Coordinated Yes   Fine Motor Movements are Fluid and Coordinated Yes   Posture/Postural Control   Posture/Postural Control Postural limitations   Postural Limitations Rounded Shoulders;Decreased lumbar lordosis   Tone   Assessment Location Other (comment)  No spasticity noted but fasciculations in B gastroc mucles.   ROM / Strength   AROM / PROM / Strength AROM;Strength   AROM   Overall AROM  Within functional limits for tasks performed   Overall AROM Comments B UE/LE and back ROM WFL and no pain with movements.   Strength   Overall Strength Deficits   Overall Strength Comments B UE/R LE 5/5. L hip flex: 4+/5, knee ext: 5/5 and knee flex: 4/5.    Palpation   Palpation comment R sided tenderness over spinal extensors in mid thoracic region.   Transfers   Transfers Sit to Stand;Stand to Sit   Sit to Stand 7: Independent   Stand to Sit 7: Independent   Ambulation/Gait   Ambulation/Gait Yes   Ambulation/Gait Assistance 7: Independent   Ambulation Distance (Feet) --  100   Assistive device None   Gait Pattern --  Decreased R arm swing    Ambulation Surface Level;Indoor   Gait velocity 4.57ft/sec.  without AD   Balance   Balance Assessed Yes   Standardized Balance Assessment   Standardized Balance Assessment Dynamic Gait Index   Dynamic Gait Index   Level Surface Normal   Change in Gait Speed Mild Impairment   Gait with Horizontal Head Turns Normal   Gait with Vertical Head Turns Normal   Gait and Pivot Turn Normal   Step Over Obstacle Normal   Step Around Obstacles Normal   Steps Normal   Total Score 23  PT Short Term Goals - June 14, 2014 1714    PT SHORT TERM GOAL #1   Title Same as LTGs.           PT Long Term Goals - 06/14/2014 1714    PT LONG TERM GOAL #1   Title Pt will be independent in HEP to improve strength and to decrease pain. Target date: 06/28/14   Status New   PT LONG TERM GOAL #2   Title Pt will report back pain is </=2/10 during ADLs in order to perform household duties. Target date: 06/28/14.   Status New   PT LONG TERM GOAL #3   Title Pt will ambulate 1000' over even/uneven terrain with equal arm swing and increased trunk/hip rotation. Target date: 06/28/14.   Status New   PT LONG TERM GOAL #4   Title Pt will perform standing activities for >10 minutes, reaching outside BOS,with 0/10 pain to perform ADLs at home. Target date: 06/28/14.   Status New               Plan - 06-14-2014 1322    Clinical Impression Statement Pt is a pleasant 53y/o male presenting to OPPT neuro with R sided back pain. Back MRI negative per patient. However, PT noted in MD's note that pt might have motor neuro disease but results are not definitive. Pt reported continuous and cramping B calf spasms (L worse then R).  Pt reported his back pain is worse during flexion motions especially when using B UEs: baking cookies, household chores, folding laundry. No pain during exercising, one hour every other day (strength training), no cardio due to bladder/kidney issues. Pt  noted to ambulate in guarded manner, with decrease R arm swing. Pt's L LE strength was less than R LE. Pt's thoracic and lumbar spine was noted to be hypomobile along entire spine, with hypertrophy of B spinal extensors (R side>L side). Pt reported pain upon palpation of R spinal extensors and lower trap, along mid thoracic region. Pt's balance and gait speed were [facility].  However, pt was noted to ambulate in guarded manner, with decreased R arm swing , trunk/hip rotation.   Pt will benefit from skilled therapeutic intervention in order to improve on the following deficits Pain;Hypomobility;Improper body mechanics;Decreased mobility;Decreased strength;Postural dysfunction   Rehab Potential Good   PT Frequency 2x / week   PT Duration 4 weeks   PT Treatment/Interventions ADLs/Self Care Home Management;Neuromuscular re-education;Biofeedback;Patient/family education;Ultrasound;Manual techniques;Therapeutic exercise;Moist Heat;Electrical Stimulation;Gait training;Functional mobility training;Therapeutic activities   PT Next Visit Plan Demonstrate proper body mechanice, no twisting to reduce back pain. manual therapy as needed. HEP for stretches (back) and L LE strengthening, as tolerated by pt as he reports cramps in B LEs keep him from exercising.   Consulted and Agree with Plan of Care Patient          G-Codes - 06-14-2014 1719    Functional Assessment Tool Used Back Pain 8-9/10 at its worst, able to wash dishes for 5-6 minutes before requiring rest break due to pain.   Functional Limitation Changing and maintaining body position   Changing and Maintaining Body Position Current Status [phone]) At least 40 percent but less than 60 percent impaired, limited or restricted   Changing and Maintaining Body Position Goal Status ([id number]) At least 1 percent but less than 20 percent impaired, limited or restricted       Problem List Patient Active Problem List   Diagnosis Date Noted  . Posture imbalance  04/13/2014  . Muscle spasm  of calf 10/15/2013  . CKD (chronic kidney disease) stage 2, GFR 60-89 ml/min 06/01/2013  . Angina pectoris 09/18/2011  . Dyspnea 06/13/2011  . NSTEMI (non-ST elevated myocardial infarction) 02/27/2011  . CAD (coronary artery disease) 02/06/2011  . HTN (hypertension) 02/06/2011  . Hyperlipidemia 02/06/2011    Rendell Thivierge L 05/31/2014, 5:20 PM  [facility] [address] [place], [place], [address] Phone: [phone]   Fax:  [phone]    Geoffry Paradise, PT,DPT 05/31/2014 5:21 PM Phone: [phone] Fax: [phone]

## 2014-06-07 ENCOUNTER — Other Ambulatory Visit: Payer: Self-pay

## 2014-06-07 MED ORDER — LOSARTAN POTASSIUM 100 MG PO TABS: 100.0000 mg | ORAL_TABLET | Freq: Every day | ORAL | Status: DC

## 2014-06-08 ENCOUNTER — Ambulatory Visit: Payer: Medicare Other

## 2014-06-08 NOTE — Addendum Note (Signed)
Addended by: Elza Rafter on: 06/08/2014 02:14 PM   Modules accepted: Orders

## 2014-06-08 NOTE — Addendum Note (Signed)
Addended by: Elza Rafter on: 06/08/2014 02:16 PM   Modules accepted: Orders

## 2014-06-10 ENCOUNTER — Ambulatory Visit: Payer: Medicare Other | Attending: Family

## 2014-06-10 DIAGNOSIS — R269 Unspecified abnormalities of gait and mobility: Secondary | ICD-10-CM | POA: Diagnosis not present

## 2014-06-10 DIAGNOSIS — R29898 Other symptoms and signs involving the musculoskeletal system: Secondary | ICD-10-CM | POA: Insufficient documentation

## 2014-06-10 DIAGNOSIS — M546 Pain in thoracic spine: Secondary | ICD-10-CM | POA: Diagnosis not present

## 2014-06-10 NOTE — Therapy (Addendum)
[facility] [address] [place], [place], [address] Phone: [phone]   Fax:  [phone]  Physical Therapy Treatment  Patient Details  Name: Andrew Joyce MRN: [id number] Date of Birth: 10-02-1960 Referring Provider:  Golden Circle, FNP  Encounter Date: 06/10/2014      PT End of Session - 06/10/14 1522    Visit Number 2   Number of Visits 9   Date for PT Re-Evaluation 06/30/14   Authorization Type G-code every 10th visit.   PT Start Time 1319   PT Stop Time 1359   PT Time Calculation (min) 40 min   Activity Tolerance Patient tolerated treatment well   Behavior During Therapy WFL for tasks assessed/performed      Past Medical History  Diagnosis Date  . Coronary artery disease     a. s/p CABG in 2008; b.  s/p cath in 2011 for med rx.;  c.  NSTEMI (02/2011):  LHC (02/2011):  LAD occluded, distal LAD filled via left to left collaterals, distal CFX 40-50%, proximal RCA occluded (right to right collaterals), distal RCA occluded, S-RCA occluded proximally, S-OM1/OM2 occluded (new from 2011 - culprit), L-LAD ok, ant apical HK, EF 45-50% - med Rx.  [name] Dyslipidemia   . Hypertension   . Recurrent UTI   . History of renal stone   . Bladder exstrophy      Congenital- had multiple surgeries as a child  . Chronic kidney disease (CKD), stage II (mild)   . Pancreatitis   . Hx of echocardiogram     Echo (05/2013):  EF 55-60%, inf HK, mild AI, normal RVSF, RVSP 28 mmHg  . Hx of cardiovascular stress test     Nuclear (06/2009):  EF 71%, small area of infarct/ischemia in inferior wall-unchanged from prior    Past Surgical History  Procedure Laterality Date  . Cardiac catheterization  07/28/2009    EF 60%; Grafts patent except for SVG to the AM and PD. Native RCA is occluded.  . Cardiac catheterization  03/20/2006    EF 60-65%  . Coronary artery bypass graft  03/2006    LIMA GRAFT TO LAD, SAPHENOUS VEIN GRAFT TO THE  ACUTE MARGINAL BRANCH THE RIGHT CORONARY, SAPHENOUS VEIN GRAFT TO THE PDA, SEQUENTIAL VEIN GRAFT TO THE FIRST AND SECOND OBTUSE MARGINAL VESSELS  . Cardiovascular stress test  06/12/2009    EF 71%, SMALL AREA OF INFARCT/ISCHEMIA IN INFERIOR WALL; FELT TO NOT BE CHANGED.  [name] Left heart catheterization with coronary angiogram N/A 02/26/2011    Procedure: LEFT HEART CATHETERIZATION WITH CORONARY ANGIOGRAM;  Surgeon: Thayer Headings, MD;  Location[facility] CATH LAB;  Service: Cardiovascular;  Laterality: N/A;    There were no vitals filed for this visit.  Visit Diagnosis:  Right-sided thoracic back pain  Left leg weakness      Subjective Assessment - 06/10/14 1321    Subjective Pt denied falls or changes since last visit.    Pertinent History B LE muscle spasms, currently undergoing testing for motor neuron disease, MI, HTN, CKD, UTIs   How long can you stand comfortably? with arm extended for 5-6 minutes before pain starts (washing dishes, etc)   Patient Stated Goals to get rid of back pain   Currently in Pain? No/denies       Manual therapy: Pt in prone, PT performed thoracic spine mobs along T1-T12 (grades 3-4), 3 sets of 30 second bouts. Pt reported no pain during mobs. Massage performed over R thoracic  spinal extensors and low trap. No reports of pain and less trigger points noted after massage. Supine B LTR stretch 3x30sec. Holds, L hamstring stretch 2x30sec. Hold  Therex: pt performed exercises, see pt instructions for details. VC's and demonstration for technique required.                          PT Education - 06/10/14 1522    Education provided Yes   Education Details Flexibility and strengthening HEP. Proper body mechanics for lifting and decreasing strain on back.   Person(s) Educated Patient   Methods Explanation;Demonstration;Verbal cues;Handout   Comprehension Verbalized understanding;Returned demonstration          PT Short Term Goals - 05/31/14  1714    PT SHORT TERM GOAL #1   Title Same as LTGs.           PT Long Term Goals - 06/10/14 1525    PT LONG TERM GOAL #1   Title Pt will be independent in HEP to improve strength and to decrease pain. Target date: 06/28/14   Status On-going   PT LONG TERM GOAL #2   Title Pt will report back pain is </=2/10 during ADLs in order to perform household duties. Target date: 06/28/14.   Status On-going   PT LONG TERM GOAL #3   Title Pt will ambulate 1000' over even/uneven terrain with equal arm swing and increased trunk/hip rotation. Target date: 06/28/14.   Status On-going   PT LONG TERM GOAL #4   Title Pt will perform standing activities for >10 minutes, reaching outside BOS,with 0/10 pain to perform ADLs at home. Target date: 06/28/14.   Status On-going               Plan - 06/10/14 1523    Clinical Impression Statement Pt demonstrated progress towards goals as he reported thoracic back pain was 0/10 today. Pt tolerated isometric back extensor strengthening and flexibility therex well today. Pt noted to have increased mobility of thoracic spine after Grade 3-4 PA mobs to T1-T12. Continue with POC.   Pt will benefit from skilled therapeutic intervention in order to improve on the following deficits Pain;Hypomobility;Improper body mechanics;Decreased mobility;Decreased strength;Postural dysfunction;Abnormal gait   Rehab Potential Good   PT Frequency 2x / week   PT Duration 4 weeks   PT Treatment/Interventions ADLs/Self Care Home Management;Neuromuscular re-education;Biofeedback;Patient/family education;Ultrasound;Manual techniques;Therapeutic exercise;Moist Heat;Electrical Stimulation;Gait training;Functional mobility training;Therapeutic activities   PT Next Visit Plan L LE strengthening program as tolerated (as strengthening can increase LE spasms). manual therapy as needed. Print body mechanics handout.   Consulted and Agree with Plan of Care Patient        Problem  List Patient Active Problem List   Diagnosis Date Noted  . Posture imbalance 04/13/2014  . Muscle spasm of calf 10/15/2013  . CKD (chronic kidney disease) stage 2, GFR 60-89 ml/min 06/01/2013  . Angina pectoris 09/18/2011  . Dyspnea 06/13/2011  . NSTEMI (non-ST elevated myocardial infarction) 02/27/2011  . CAD (coronary artery disease) 02/06/2011  . HTN (hypertension) 02/06/2011  . Hyperlipidemia 02/06/2011    Miller,Jennifer L 06/10/2014, 4:24 PM  [facility] [address] [place], [place], [address] Phone: [phone]   Fax:  [phone]     Geoffry Paradise, PT,DPT 06/10/2014 4:24 PM Phone: [phone] Fax: [phone]

## 2014-06-10 NOTE — Patient Instructions (Signed)
Isometric spinal extensor strengthening: Lie on your back with your knees bent and arms at your side. Press hands into the bed/mat and hold for 5 seconds, take a rest break, perform 10 times. Once a day, 3-4 days per week.  Lower Trunk Rotation Stretch   Keeping back flat and feet together, rotate knees to left side. Repeat with knees to the right side Hold __30__ seconds. Repeat _3___ times per set. Do __1__ sets per session. Do _1-2___ sessions per day.  http://orth.exer.us/122   Copyright  VHI. All rights reserved.    Hamstring Stretch, Seated (Strap, Two Chairs)   Sit with left leg extended onto facing chair. Bend forward at the hips only and keep back straight. Don't use strap. Hold for _30___ seconds. Repeat _3___ times each leg.  Copyright  VHI. All rights reserved.   Gastroc / Heel Cord Stretch - Seated With Towel   Sit on floor, towel around left ball of foot. Gently pull foot in toward body, stretching heel cord and calf. Hold for _30__ seconds. Repeat _3__ times. Do _3__ times per day.  Copyright  VHI. All rights reserved.    Calf / Gastoc: Runners' Stretch I   One leg back and straight (right leg), other forward and bent supporting weight, lean forward, gently stretching calf of back leg. Hold __30__ seconds.  Repeat __3__ times. Do __3__ sessions per day.  Copyright  VHI. All rights reserved.

## 2014-06-14 ENCOUNTER — Ambulatory Visit: Payer: Medicare Other | Admitting: Physical Therapy

## 2014-06-14 DIAGNOSIS — R29898 Other symptoms and signs involving the musculoskeletal system: Secondary | ICD-10-CM | POA: Diagnosis not present

## 2014-06-14 DIAGNOSIS — R269 Unspecified abnormalities of gait and mobility: Secondary | ICD-10-CM | POA: Diagnosis not present

## 2014-06-14 DIAGNOSIS — M546 Pain in thoracic spine: Secondary | ICD-10-CM | POA: Diagnosis not present

## 2014-06-14 NOTE — Therapy (Signed)
[facility] [address] [place], [place], [address] Phone: [phone]   Fax:  [phone]  Physical Therapy Treatment  Patient Details  Name: Andrew Joyce MRN: [id number] Date of Birth: [birth year] Referring Provider:  Golden Circle, FNP  Encounter Date: 06/14/2014      PT End of Session - 06/14/14 1533    Visit Number 3   Number of Visits 9   Date for PT Re-Evaluation 06/30/14   Authorization Type G-code every 10th visit.   PT Start Time 1405   PT Stop Time 1449   PT Time Calculation (min) 44 min   Activity Tolerance Patient tolerated treatment well   Behavior During Therapy WFL for tasks assessed/performed      Past Medical History  Diagnosis Date  . Coronary artery disease     a. s/p CABG in 2008; b.  s/p cath in 2011 for med rx.;  c.  NSTEMI (02/2011):  LHC (02/2011):  LAD occluded, distal LAD filled via left to left collaterals, distal CFX 40-50%, proximal RCA occluded (right to right collaterals), distal RCA occluded, S-RCA occluded proximally, S-OM1/OM2 occluded (new from 2011 - culprit), L-LAD ok, ant apical HK, EF 45-50% - med Rx.  [name] Dyslipidemia   . Hypertension   . Recurrent UTI   . History of renal stone   . Bladder exstrophy      Congenital- had multiple surgeries as a child  . Chronic kidney disease (CKD), stage II (mild)   . Pancreatitis   . Hx of echocardiogram     Echo (05/2013):  EF 55-60%, inf HK, mild AI, normal RVSF, RVSP 28 mmHg  . Hx of cardiovascular stress test     Nuclear (06/2009):  EF 71%, small area of infarct/ischemia in inferior wall-unchanged from prior    Past Surgical History  Procedure Laterality Date  . Cardiac catheterization  07/28/2009    EF 60%; Grafts patent except for SVG to the AM and PD. Native RCA is occluded.  . Cardiac catheterization  03/20/2006    EF 60-65%  . Coronary artery bypass graft  03/2006    LIMA GRAFT TO LAD, SAPHENOUS VEIN GRAFT TO THE  ACUTE MARGINAL BRANCH THE RIGHT CORONARY, SAPHENOUS VEIN GRAFT TO THE PDA, SEQUENTIAL VEIN GRAFT TO THE FIRST AND SECOND OBTUSE MARGINAL VESSELS  . Cardiovascular stress test  06/12/2009    EF 71%, SMALL AREA OF INFARCT/ISCHEMIA IN INFERIOR WALL; FELT TO NOT BE CHANGED.  [name] Left heart catheterization with coronary angiogram N/A 02/26/2011    Procedure: LEFT HEART CATHETERIZATION WITH CORONARY ANGIOGRAM;  Surgeon: Thayer Headings, MD;  Location: [facility] CATH LAB;  Service: Cardiovascular;  Laterality: N/A;    There were no vitals filed for this visit.  Visit Diagnosis:  Left leg weakness      Subjective Assessment - 06/14/14 1531    Subjective Denies falls or changes since last visit.  Continues with LE spasms.   Pertinent History B LE muscle spasms, currently undergoing testing for motor neuron disease, MI, HTN, CKD, UTIs   How long can you stand comfortably? with arm extended for 5-6 minutes before pain starts (washing dishes, etc)   Patient Stated Goals to get rid of back pain   Currently in Pain? No/denies      Pt performed all exercises provided as part of HEP as written. Also performed bil heel cord stretch off edge of step x 30 seconds, Bil leg press 90# x 15, 100# x 15, LLE  60# x 15, LLE 70# x 15, pull ups onto/off 6" step bil LE x 15, step downs from 6" step bil LE x 15, bil heel raises x 20, LLE heel raise x 15, LLE hamstring curl standing with 5# weight x 15 x 2, side stepping with green theraband x 8'x6 and sidestepping with squat stance and green theraband x 8' x 6.          PT Education - 06/14/14 1532    Education provided Yes   Education Details HEP   Person(s) Educated Patient   Methods Explanation;Demonstration;Handout   Comprehension Verbalized understanding          PT Short Term Goals - 05/31/14 1714    PT SHORT TERM GOAL #1   Title Same as LTGs.           PT Long Term Goals - 06/10/14 1525    PT LONG TERM GOAL #1   Title Pt will be independent in HEP to  improve strength and to decrease pain. Target date: 06/28/14   Status On-going   PT LONG TERM GOAL #2   Title Pt will report back pain is </=2/10 during ADLs in order to perform household duties. Target date: 06/28/14.   Status On-going   PT LONG TERM GOAL #3   Title Pt will ambulate 1000' over even/uneven terrain with equal arm swing and increased trunk/hip rotation. Target date: 06/28/14.   Status On-going   PT LONG TERM GOAL #4   Title Pt will perform standing activities for >10 minutes, reaching outside BOS,with 0/10 pain to perform ADLs at home. Target date: 06/28/14.   Status On-going               Plan - 06/14/14 1533    Clinical Impression Statement Pt tolerated treatment well with no rest breaks needed.  LLE spasms visible during session today.  Continue PT per POC.   Pt will benefit from skilled therapeutic intervention in order to improve on the following deficits Pain;Hypomobility;Improper body mechanics;Decreased mobility;Decreased strength;Postural dysfunction;Abnormal gait   Rehab Potential Good   PT Frequency 2x / week   PT Duration 4 weeks   PT Treatment/Interventions ADLs/Self Care Home Management;Neuromuscular re-education;Biofeedback;Patient/family education;Ultrasound;Manual techniques;Therapeutic exercise;Moist Heat;Electrical Stimulation;Gait training;Functional mobility training;Therapeutic activities   PT Next Visit Plan Review HEP and add as needed.  Body mechanics handout.   Consulted and Agree with Plan of Care Patient        Problem List Patient Active Problem List   Diagnosis Date Noted  . Posture imbalance 04/13/2014  . Muscle spasm of calf 10/15/2013  . CKD (chronic kidney disease) stage 2, GFR 60-89 ml/min 06/01/2013  . Angina pectoris 09/18/2011  . Dyspnea 06/13/2011  . NSTEMI (non-ST elevated myocardial infarction) 02/27/2011  . CAD (coronary artery disease) 02/06/2011  . HTN (hypertension) 02/06/2011  . Hyperlipidemia 02/06/2011     Narda Bonds 06/14/2014, 3:35 PM  [facility] [address] [place], [place], [address] Phone: [phone]   Fax:  [place], [place] 06/14/2014 3:38 PM Phone: [phone] Fax: [phone]

## 2014-06-14 NOTE — Patient Instructions (Signed)
"  [employer] love a Parade" Lift   Use 5 lb weight on left leg.  Using a chair if necessary, march in place 4 as high as you can. Repeat 15 times. Do 2 sessions per day.  http://gt2.exer.us/344   Copyright  VHI. All rights reserved.   EXTENSION: Standing (Active)   Stand, both feet flat. Draw left leg behind body as far as possible. Use 5 lbs. Complete 2 sets of 15 repetitions. Perform 2 sessions per day.  http://gtsc.exer.us/76   Copyright  VHI. All rights reserved.   HIP: Abduction - Standing (Weight)   Place weight around leg. Squeeze glutes. Raise left leg out and slightly back. Hold 5 seconds. Use 5 lb weight. 15 reps per set, 2 sets per day.  Hold onto a support.  Copyright  VHI. All rights reserved.  Hip Extension (Standing)   Stand with support. Squeeze pelvic floor and hold. Move left leg backward with straight knee. Hold for 3seconds. Relax. Repeat 15 times for 2 sets. Use 5 lb weight.   Copyright  VHI. All rights reserved.  ABDUCTION: Standing - Resistance Band (Active)   Stand, feet flat. Against green resistance band, lift left leg out to side. Complete 2 sets of 15 repetitions. Perform 1 sessions per day.  Can also perform while side stepping holding onto counter.  http://gtsc.exer.us/116   Copyright  VHI. All rights reserved.

## 2014-06-17 ENCOUNTER — Ambulatory Visit: Payer: Medicare Other

## 2014-06-21 ENCOUNTER — Ambulatory Visit: Payer: Medicare Other | Admitting: Physical Therapy

## 2014-06-21 DIAGNOSIS — M546 Pain in thoracic spine: Secondary | ICD-10-CM

## 2014-06-21 DIAGNOSIS — R29898 Other symptoms and signs involving the musculoskeletal system: Secondary | ICD-10-CM

## 2014-06-21 DIAGNOSIS — R269 Unspecified abnormalities of gait and mobility: Secondary | ICD-10-CM | POA: Diagnosis not present

## 2014-06-22 NOTE — Therapy (Signed)
[facility] [address] [place], [place], [address] Phone: [phone]   Fax:  [phone]  Physical Therapy Treatment  Patient Details  Name: Andrew Joyce MRN: [id number] Date of Birth: 1960-11-08 Referring Provider:  Golden Circle, FNP  Encounter Date: 06/21/2014      PT End of Session - 06/22/14 1330    Visit Number 4   Number of Visits 9   Date for PT Re-Evaluation 06/30/14   Authorization Type G-code every 10th visit.   PT Start Time 1405   PT Stop Time 1448   PT Time Calculation (min) 43 min   Activity Tolerance Patient tolerated treatment well   Behavior During Therapy WFL for tasks assessed/performed      Past Medical History  Diagnosis Date  . Coronary artery disease     a. s/p CABG in 2008; b.  s/p cath in 2011 for med rx.;  c.  NSTEMI (02/2011):  LHC (02/2011):  LAD occluded, distal LAD filled via left to left collaterals, distal CFX 40-50%, proximal RCA occluded (right to right collaterals), distal RCA occluded, S-RCA occluded proximally, S-OM1/OM2 occluded (new from 2011 - culprit), L-LAD ok, ant apical HK, EF 45-50% - med Rx.  [name] Dyslipidemia   . Hypertension   . Recurrent UTI   . History of renal stone   . Bladder exstrophy      Congenital- had multiple surgeries as a child  . Chronic kidney disease (CKD), stage II (mild)   . Pancreatitis   . Hx of echocardiogram     Echo (05/2013):  EF 55-60%, inf HK, mild AI, normal RVSF, RVSP 28 mmHg  . Hx of cardiovascular stress test     Nuclear (06/2009):  EF 71%, small area of infarct/ischemia in inferior wall-unchanged from prior    Past Surgical History  Procedure Laterality Date  . Cardiac catheterization  07/28/2009    EF 60%; Grafts patent except for SVG to the AM and PD. Native RCA is occluded.  . Cardiac catheterization  03/20/2006    EF 60-65%  . Coronary artery bypass graft  03/2006    LIMA GRAFT TO LAD, SAPHENOUS VEIN GRAFT TO THE  ACUTE MARGINAL BRANCH THE RIGHT CORONARY, SAPHENOUS VEIN GRAFT TO THE PDA, SEQUENTIAL VEIN GRAFT TO THE FIRST AND SECOND OBTUSE MARGINAL VESSELS  . Cardiovascular stress test  06/12/2009    EF 71%, SMALL AREA OF INFARCT/ISCHEMIA IN INFERIOR WALL; FELT TO NOT BE CHANGED.  [name] Left heart catheterization with coronary angiogram N/A 02/26/2011    Procedure: LEFT HEART CATHETERIZATION WITH CORONARY ANGIOGRAM;  Surgeon: Thayer Headings, MD;  Location: [facility] CATH LAB;  Service: Cardiovascular;  Laterality: N/A;    There were no vitals filed for this visit.  Visit Diagnosis:  Left leg weakness  Right-sided thoracic back pain      Subjective Assessment - 06/22/14 1328    Subjective Still having significant LLE spasms that wake him up at night and back pain with static standing.   Pertinent History B LE muscle spasms, currently undergoing testing for motor neuron disease, MI, HTN, CKD, UTIs   How long can you stand comfortably? with arm extended for 5-6 minutes before pain starts (washing dishes, etc)   Patient Stated Goals to get rid of back pain   Currently in Pain? No/denies       Pt continues to c/o back pain.  Able to palpate area over thoracic spine around T2 level.  Hard "trigger point/knot" present. Performed  various activities to try to induce pain or simulate stretch to this area including knee to chest, trunk rotation supine, standing rotation reaching top/bottom both directions, supine overhead reach, supine pelvic tilt, seated thoracic flexion/extension.  Unable to induce pain or stretch during any activities. BIL LE leg press x 110# x 20 reps then single leg press x 80# x 20 reps each. Standing on one leg for heel raises and jumping on one leg-performed on bil LE's with minimal UE assist. Standing on edge of step for heel cord stretch both with knees extended and knees bent x 30 seconds x 2 reps.       PT Education - 06/22/14 1329    Education provided Yes   Education Details Possibly  holding/discharging PT until seen my Neurologist again   [employer]) Educated Patient   Methods Explanation   Comprehension Verbalized understanding          PT Short Term Goals - 05/31/14 1714    PT SHORT TERM GOAL #1   Title Same as LTGs.           PT Long Term Goals - 06/10/14 1525    PT LONG TERM GOAL #1   Title Pt will be independent in HEP to improve strength and to decrease pain. Target date: 06/28/14   Status On-going   PT LONG TERM GOAL #2   Title Pt will report back pain is </=2/10 during ADLs in order to perform household duties. Target date: 06/28/14.   Status On-going   PT LONG TERM GOAL #3   Title Pt will ambulate 1000' over even/uneven terrain with equal arm swing and increased trunk/hip rotation. Target date: 06/28/14.   Status On-going   PT LONG TERM GOAL #4   Title Pt will perform standing activities for >10 minutes, reaching outside BOS,with 0/10 pain to perform ADLs at home. Target date: 06/28/14.   Status On-going               Plan - 06/22/14 1330    Clinical Impression Statement Pt continues with c/o back pain with static standing activities at home and LLE spasms.  Little change in back pain since beginning PT.  Discussed with pt and Geoffry Paradise, PT.  Geoffry Paradise to see pt on next session and possibly hold/discharge from PT until seen by neurologist with more definitive diagnosis.     Pt will benefit from skilled therapeutic intervention in order to improve on the following deficits Pain;Hypomobility;Improper body mechanics;Decreased mobility;Decreased strength;Postural dysfunction;Abnormal gait   Rehab Potential Good   PT Frequency 2x / week   PT Duration 4 weeks   PT Treatment/Interventions ADLs/Self Care Home Management;Neuromuscular re-education;Biofeedback;Patient/family education;Ultrasound;Manual techniques;Therapeutic exercise;Moist Heat;Electrical Stimulation;Gait training;Functional mobility training;Therapeutic activities   PT Next  Visit Plan Discuss hold/discharge with pt.   Consulted and Agree with Plan of Care Patient        Problem List Patient Active Problem List   Diagnosis Date Noted  . Posture imbalance 04/13/2014  . Muscle spasm of calf 10/15/2013  . CKD (chronic kidney disease) stage 2, GFR 60-89 ml/min 06/01/2013  . Angina pectoris 09/18/2011  . Dyspnea 06/13/2011  . NSTEMI (non-ST elevated myocardial infarction) 02/27/2011  . CAD (coronary artery disease) 02/06/2011  . HTN (hypertension) 02/06/2011  . Hyperlipidemia 02/06/2011    Narda Bonds 06/22/2014, 1:42 PM  [facility] [address] [place], [place], [address] Phone: [phone]   Fax:  [place],  PTA [place] 06/22/2014 1:42 PM Phone: [phone] Fax: [phone]

## 2014-06-24 ENCOUNTER — Ambulatory Visit: Payer: Medicare Other

## 2014-06-24 DIAGNOSIS — R29898 Other symptoms and signs involving the musculoskeletal system: Secondary | ICD-10-CM

## 2014-06-24 DIAGNOSIS — R269 Unspecified abnormalities of gait and mobility: Secondary | ICD-10-CM | POA: Diagnosis not present

## 2014-06-24 DIAGNOSIS — M546 Pain in thoracic spine: Secondary | ICD-10-CM

## 2014-06-24 NOTE — Therapy (Signed)
[facility] [address] [place], [place], [address] Phone: [phone]   Fax:  [phone]  Physical Therapy Treatment  Patient Details  Name: Andrew Joyce MRN: [id number] Date of Birth: [birth year] Referring Provider:  Golden Circle, FNP  Encounter Date: 06/24/2014      PT End of Session - 06/24/14 1348    Visit Number 5   Number of Visits 9   Date for PT Re-Evaluation 06/30/14   Authorization Type G-code every 10th visit.   PT Start Time 1316   PT Stop Time 1341   PT Time Calculation (min) 25 min   Activity Tolerance Patient tolerated treatment well   Behavior During Therapy WFL for tasks assessed/performed      Past Medical History  Diagnosis Date  . Coronary artery disease     a. s/p CABG in 2008; b.  s/p cath in 2011 for med rx.;  c.  NSTEMI (02/2011):  LHC (02/2011):  LAD occluded, distal LAD filled via left to left collaterals, distal CFX 40-50%, proximal RCA occluded (right to right collaterals), distal RCA occluded, S-RCA occluded proximally, S-OM1/OM2 occluded (new from 2011 - culprit), L-LAD ok, ant apical HK, EF 45-50% - med Rx.  [name] Dyslipidemia   . Hypertension   . Recurrent UTI   . History of renal stone   . Bladder exstrophy      Congenital- had multiple surgeries as a child  . Chronic kidney disease (CKD), stage II (mild)   . Pancreatitis   . Hx of echocardiogram     Echo (05/2013):  EF 55-60%, inf HK, mild AI, normal RVSF, RVSP 28 mmHg  . Hx of cardiovascular stress test     Nuclear (06/2009):  EF 71%, small area of infarct/ischemia in inferior wall-unchanged from prior    Past Surgical History  Procedure Laterality Date  . Cardiac catheterization  07/28/2009    EF 60%; Grafts patent except for SVG to the AM and PD. Native RCA is occluded.  . Cardiac catheterization  03/20/2006    EF 60-65%  . Coronary artery bypass graft  03/2006    LIMA GRAFT TO LAD, SAPHENOUS VEIN GRAFT TO THE  ACUTE MARGINAL BRANCH THE RIGHT CORONARY, SAPHENOUS VEIN GRAFT TO THE PDA, SEQUENTIAL VEIN GRAFT TO THE FIRST AND SECOND OBTUSE MARGINAL VESSELS  . Cardiovascular stress test  06/12/2009    EF 71%, SMALL AREA OF INFARCT/ISCHEMIA IN INFERIOR WALL; FELT TO NOT BE CHANGED.  [name] Left heart catheterization with coronary angiogram N/A 02/26/2011    Procedure: LEFT HEART CATHETERIZATION WITH CORONARY ANGIOGRAM;  Surgeon: Thayer Headings, MD;  Location: [facility] CATH LAB;  Service: Cardiovascular;  Laterality: N/A;    There were no vitals filed for this visit.  Visit Diagnosis:  Right-sided thoracic back pain  Abnormality of gait  Left leg weakness      Subjective Assessment - 06/24/14 1318    Subjective Pt reported increased back pain today, after preparing for [employer]. Pt reported he is still having severe L calf spasms, which wake him up at night.    Pertinent History B LE muscle spasms, currently undergoing testing for motor neuron disease, MI, HTN, CKD, UTIs   How long can you stand comfortably? with arm extended for 5-6 minutes before pain starts (washing dishes, etc)   Patient Stated Goals to get rid of back pain   Currently in Pain? Yes   Pain Score 4    Pain Location Back  Pain Orientation Right   Pain Descriptors / Indicators Aching   Pain Type Chronic pain   Pain Onset More than a month ago   Pain Frequency Intermittent  but more frequent   Aggravating Factors  trunk flexion with UEs extended   Pain Relieving Factors lying down      Ischemic compression: R thoracic muscle trigger points (lower trap and spinal extensors), increased pain from 4/10 to 6.5/10.  Pt reported R back pain continues to be 8-9/10 at worst, and increases after 7-8 minutes of standing with B UE flexion (making hamburgers at counter).                   [facility] Adult PT Treatment/Exercise - 06/24/14 1343    Ambulation/Gait   Ambulation/Gait Yes   Ambulation/Gait Assistance 7:  Independent  pt noted to experience improved arm swing and trunk rotation   Ambulation Distance (Feet) 1100 Feet   Assistive device None   Gait Pattern Within Functional Limits   Ambulation Surface Level;Unlevel;Outdoor;Paved                PT Education - 06/24/14 1346    Education provided Yes   Education Details PT discussed holding OPPT neuro until after neurologist eval with Dr. Posey Pronto, in order to obtain a definitive diagnosis (regarding L calf muscle spasms and unspecified motor neuron disorder). PT also recommended pt transfer to [address] to attempt dry needling of R thoracic musculature to decrease back pain/trigger points. Dry needling must be cleared with MD and not contraindicated based on pt diagnosis.    Person(s) Educated Patient   Methods Explanation   Comprehension Verbalized understanding          PT Short Term Goals - 05/31/14 1714    PT SHORT TERM GOAL #1   Title Same as LTGs.           PT Long Term Goals - 06/24/14 1352    PT LONG TERM GOAL #1   Title Pt will be independent in HEP to improve strength and to decrease pain. Target date: 06/28/14   Status Achieved   PT LONG TERM GOAL #2   Title Pt will report back pain is </=2/10 during ADLs in order to perform household duties. Target date: 06/28/14.   Status Not Met   PT LONG TERM GOAL #3   Title Pt will ambulate 1000' over even/uneven terrain with equal arm swing and increased trunk/hip rotation. Target date: 06/28/14.   Status Achieved   PT LONG TERM GOAL #4   Title Pt will perform standing activities for >10 minutes, reaching outside BOS,with 0/10 pain to perform ADLs at home. Target date: 06/28/14.   Status Not Met               Plan - 06/24/14 1348    Clinical Impression Statement Pt continues to experience R thoracic back pain, which is not reproducible with any movements during PT session. However, pt reported it increases at home during standing activities, with B UE flexion. PT  performed ischemic compression to thoracic trigger points, with no decrease in pain or muscle tension. PT recommends holding PT at this time, until after pt's eval with neurologist (Dr. 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 If cleared by MD, PT will transfer pt to ortho PT (on [facility] for dry needling. Pt will call back after neurologist eval and PT will either d/c pt or transfer to  OPPT ortho at that time.   Pt will benefit from skilled therapeutic intervention in order to improve on the following deficits Pain;Hypomobility;Improper body mechanics;Decreased mobility;Decreased strength;Postural dysfunction;Abnormal gait   Rehab Potential Fair   PT Frequency 2x / week   PT Duration 4 weeks   PT Treatment/Interventions ADLs/Self Care Home Management;Neuromuscular re-education;Biofeedback;Patient/family education;Ultrasound;Manual techniques;Therapeutic exercise;Moist Heat;Electrical Stimulation;Gait training;Functional mobility training;Therapeutic activities   PT Next Visit Plan On hold until pt's neurology eval with Dr. Posey Pronto. PT will either d/c pt or transfer to [place] based on MD recommendation.   Consulted and Agree with Plan of Care Patient        Problem List Patient Active Problem List   Diagnosis Date Noted  . Posture imbalance 04/13/2014  . Muscle spasm of calf 10/15/2013  . CKD (chronic kidney disease) stage 2, GFR 60-89 ml/min 06/01/2013  . Angina pectoris 09/18/2011  . Dyspnea 06/13/2011  . NSTEMI (non-ST elevated myocardial infarction) 02/27/2011  . CAD (coronary artery disease) 02/06/2011  . HTN (hypertension) 02/06/2011  . Hyperlipidemia 02/06/2011    Miller,Jennifer L 06/24/2014, 1:53 PM  [facility] [address] [place], [place], [address] Phone: [phone]   Fax:  [phone]     Geoffry Paradise, PT,DPT 06/24/2014 1:53 PM Phone:  [phone] Fax: [phone]

## 2014-06-27 ENCOUNTER — Ambulatory Visit: Payer: Self-pay | Admitting: Neurology

## 2014-06-28 ENCOUNTER — Ambulatory Visit: Payer: Medicare Other

## 2014-06-29 ENCOUNTER — Ambulatory Visit: Payer: Medicare Other

## 2014-07-01 ENCOUNTER — Encounter: Payer: Self-pay | Admitting: Neurology

## 2014-07-01 ENCOUNTER — Ambulatory Visit (INDEPENDENT_AMBULATORY_CARE_PROVIDER_SITE_OTHER): Payer: Medicare Other | Admitting: Neurology

## 2014-07-01 VITALS — BP 130/74 | HR 55 | Wt 174.2 lb

## 2014-07-01 DIAGNOSIS — M549 Dorsalgia, unspecified: Secondary | ICD-10-CM | POA: Diagnosis not present

## 2014-07-01 DIAGNOSIS — G8310 Monoplegia of lower limb affecting unspecified side: Secondary | ICD-10-CM

## 2014-07-01 DIAGNOSIS — I25119 Atherosclerotic heart disease of native coronary artery with unspecified angina pectoris: Secondary | ICD-10-CM | POA: Diagnosis not present

## 2014-07-01 DIAGNOSIS — M5417 Radiculopathy, lumbosacral region: Secondary | ICD-10-CM | POA: Diagnosis not present

## 2014-07-01 DIAGNOSIS — R253 Fasciculation: Secondary | ICD-10-CM | POA: Diagnosis not present

## 2014-07-01 MED ORDER — TRAMADOL HCL 50 MG PO TABS: 50.0000 mg | ORAL_TABLET | Freq: Two times a day (BID) | ORAL | Status: DC | PRN

## 2014-07-01 NOTE — Progress Notes (Signed)
Follow-up Visit   Date: 07/01/2014    Andrew Joyce MRN: [id number] DOB: 1960-12-11   Interim History: Andrew Joyce is a 54 y.o. left-handed Caucasian male with congenital exstrophy status post multiple surgeries complicated by chronic UTI, CAD s/p CABG (2008), hyperlipidemia, hypertension, and stage II CKD returning to the clinic for follow-up of left leg twitches and weakness.   History of present illness: Starting in October 2015, he developed "firing nerves" and cramping of the left legs. Symptoms are worse at night and keep him from sleeping because of painful cramps. They usually last several minutes. He has no associated weakness or similar symptoms on the right leg or arms. He went to see his PCP who recommended melatonin, muscle relaxants (robaxin), ambien, and heat packs. He also tried taking valium at night which did not help much.   Follow-up 12/15/2013: He feels that the agitation of the leg and cramps has improved since starting baclofen and he is able to sleep better through the night.  There has been no change in his leg weakness or twitches.  He has noticed intermittent cramping of the left hand, especially when driving.  Denies problems with swallowing, talking, or shortness of breath. No recent falls.  He denies any pain of his low back.  Here is here to discuss EMG results.  UPDATE 01/25/2014:  He reports having cramps about 3-4 times of the night.  He tried his family member's tramadol which completely alleviated his cramps.  He has not noticed left leg weakness to be a problem, but his muscle twitches remain unchanged.   UPDATE 05/03/2014:   Since his last visit, he reports noticing new twitches in his right lower leg, but has not noticed any weakness. He reports having new right mid-back pain, described as severe and excruciating ache especially with activity, such as washing dishes, folding clothes, playing the piano, etc.  He has severe achy and  soreness of his back.    UPDATE 07/01/2014:  He was discharged from PT early because there was no improvement in his back pain with exercises recommended. He continues to have localized midback pain which is worsening because it takes less activity for it to trigger symptoms and intensity is much worse.  It is improved when he lays down and rests, but within several minutes of doing any activies with his hands (cooking, cleaning, folding laundry, etc) the achy pain is aggravated.  MRI thoracic spine did not show any nerve impingement.     His leg cramps are worse as well.  He reports being unable to sleep well for at least 4 nights per week because of the pain.  Baclofen has helped some.  Medications:  Current Outpatient Prescriptions on File Prior to Visit  Medication Sig Dispense Refill  . amLODipine (NORVASC) 5 MG tablet Take 1 tablet (5 mg total) by mouth daily. 30 tablet 6  . aspirin 81 MG tablet Take 1 tablet (81 mg total) by mouth daily.    . baclofen (LIORESAL) 10 MG tablet Take 10 mg by mouth 3 (three) times daily. Take 2 tablets in the morning, 1 at lunchtime and 3 at bedtime.    . baclofen (LIORESAL) 10 MG tablet Take 1 tab in the morning and 3 tab at bedtime 180 each 5  . clopidogrel (PLAVIX) 75 MG tablet Take 75 mg by mouth daily with breakfast.    . diclofenac sodium (VOLTAREN) 1 % GEL Apply to mid-back area twice daily as needed for  pain. 100 g 3  . fenofibrate 54 MG tablet Take 1 tablet (54 mg total) by mouth daily. 30 tablet 6  . fish oil-omega-3 fatty acids 1000 MG capsule Take 2 capsules (2 g total) by mouth 2 (two) times daily.    . isosorbide mononitrate (IMDUR) 60 MG 24 hr tablet TAKE ONE & ONE-HALF TABLETS BY MOUTH ONCE DAILY 45 tablet 6  . lidocaine (LIDODERM) 5 % Place 1 patch onto the skin daily. Remove & Discard patch within 12 hours or as directed by MD 30 patch 2  . losartan (COZAAR) 100 MG tablet Take 1 tablet (100 mg total) by mouth daily. 30 tablet 11  . metoprolol  (LOPRESSOR) 50 MG tablet Take 1 tablet (50 mg total) by mouth 2 (two) times daily. 60 tablet 8  . naproxen (NAPROSYN) 500 MG tablet Take 1 tablet (500 mg total) by mouth 2 (two) times daily with a meal. 60 tablet 0  . nitrofurantoin (MACRODANTIN) 100 MG capsule Take 100 mg by mouth at bedtime.  11  . nitroGLYCERIN (NITROSTAT) 0.4 MG SL tablet Place 1 tablet (0.4 mg total) under the tongue every 5 (five) minutes as needed. For chest pain 25 tablet 3  . pravastatin (PRAVACHOL) 40 MG tablet Take 1 tablet (40 mg total) by mouth daily. 30 tablet 6   No current facility-administered medications on file prior to visit.    Allergies: No Known Allergies  Review of Systems:  CONSTITUTIONAL: No fevers, chills, night sweats, or weight loss.  EYES: No visual changes or eye pain ENT: No hearing changes.  No history of nose bleeds.   RESPIRATORY: No cough, wheezing and shortness of breath.   CARDIOVASCULAR: Negative for chest pain, and palpitations.   GI: Negative for abdominal discomfort, blood in stools or black stools.  No recent change in bowel habits.   GU:  No history of incontinence.   MUSCLOSKELETAL: No history of joint pain or swelling.  +myalgias.   SKIN: Negative for lesions, rash, and itching.   ENDOCRINE: Negative for cold or heat intolerance, polydipsia or goiter.   PSYCH:  No depression or anxiety symptoms.   NEURO: As Above.   Vital Signs:  BP 130/74 mmHg  Pulse 55  Wt 174 lb 4 oz (79.039 kg)  SpO2 97%  Neurological Exam: MENTAL STATUS including orientation to time, place, person, recent and remote memory, attention span and concentration, language, and fund of knowledge is normal.  Speech is not dysarthric.  CRANIAL NERVES:  Pupils equal round and reactive to light.  Normal conjugate, extra-ocular eye movements in all directions of gaze.  No ptosis. Normal facial sensation.  Face is symmetric. Palate elevates symmetrically.  Tongue is midline.  MOTOR:  Moderate atrophy of the  left lower leg (quads, medial gastroc). Prominent and active fasciculations of the left > right calf. Normal tone.   Right Upper Extremity:    Left Upper Extremity:    Deltoid  5/5   Deltoid  5/5   Biceps  5/5   Biceps  5/5   Triceps  5/5   Triceps  5/5   Wrist extensors  5/5   Wrist extensors  5/5   Wrist flexors  5/5   Wrist flexors  5/5   Finger extensors  5/5   Finger extensors  5/5   Finger flexors  5/5   Finger flexors  5/5   Dorsal interossei  5/5   Dorsal interossei  5/5   Abductor pollicis  5/5   Abductor pollicis  5/5   Tone (Ashworth scale)  0  Tone (Ashworth scale)  0   Right Lower Extremity:    Left Lower Extremity:    Hip flexors  5/5   Hip flexors  5+/5   Hip extensors  5/5   Hip extensors  5-/5   Knee flexors  5/5   Knee flexors  4+/5   Knee extensors  5/5   Knee extensors  5-/5   Dorsiflexors  5/5   Dorsiflexors  4+/5   Plantarflexors  5/5   Plantarflexors  5/5   Toe extensors  5/5   Toe extensors  4+/5   Toe flexors  5/5   Toe flexors  5/5   Tone (Ashworth scale)  0  Tone (Ashworth scale)  0   MSRs:  Right                                                                 Left brachioradialis 1+  brachioradialis 1+  biceps 1+  biceps 1+  triceps 1+  triceps 1+  patellar 2+  patellar 3+  ankle jerk 2+  ankle jerk 1+  Hoffman no  Hoffman no  plantar response down  plantar response down   COORDINATION/GAIT:   Gait narrow based and stable. Stressed gait intact.  Data: EMG 12/15/2013: 1. Chronic C6 radiculopathy affecting the left upper extremity. 2. Left ulnar neuropathy with slowing across the elbow, purely demyelinating in type. 3. Multilevel intraspinal canal lesion affecting L2-S1 myotomes bilaterally. These findings are moderate in degree electrically and worse on the left side involving the L2-L4 myotome. Active changes are also seen affecting the medial gastrocnemius muscle. These findings are insufficient for the diagnosis of motor neuron disease,  recommend repeat electrodiagnostic testing in 6 months if clinically indicated.  Labs 11/25/2013:  CK 215, aldolase 9.2*, PTH 34, SPEP/UPEP with IFE no Mo protein, copper 116, zinc 67, CRP 0.6*, ESR 47  CT lumbar spine 12/17/2013: 1. Severe degenerative disc disease with disc height loss at L5-S1 with a mild broad-based disc osteophyte complex and right foraminal narrowing. 2. Mild broad-based disc bulge at L4-5 without foraminal or central canal stenosis.  MRI thoracic spine wo contrast 05/29/2014: Small central disc protrusions T3-4 and T7-8 without neural impingement or significant spinal stenosis. No acute abnormality.  MRI lumbar spine wo contrast 05/29/2014: Mild to moderate spinal stenosis at L4-5 with small central disc protrusion and diffuse disc bulging and spondylosis  Postop laminectomy left L5-S1 without recurrent disc protrusion. There is spondylosis causing subarticular and foraminal stenosis bilaterally at L5-S1.   IMPRESSION/PLAN: Mr. Doenges is a 54 year-old gentleman returning for evaluation of left leg fasciculations, weakness, and painful cramps.   His primary complaint is ongoing and worsening localized right mid-back pain, which are severe when it occurs.  There is no nerve impingement at this level on MRI.  Symptoms are mildly reproducible by deep palpation suggesting it may be musculoskeletal in origin.  He has not benefited with PT or muscle relaxants.  Unfortunately, he is unable to take NSAIDs due to renal disease.  I would like to refer him for trigger point injection.  His exam today shows improved right leg strength and left hip flexion, which may be to the benefits of PT.  He continues to  have marked knee flexion weakness. With his fasciculations, I am concerned about early motor neuron disease but this was not evident on his EMG from December.  We discussed repeating his EMG going forward.  Alternatively, cramp fasiculation syndrome is possible, but I would  not expect weakness.    For his muscle cramps, options include adding amitriptyline 54m qhs or mexiletine 1561mBID, with the later being more effective.  I will obtain baseline EKG and discuss with his cardiologist, if either of these options are reasonable from a cardiac stand-point.  In the meantime, he can continue baclofen baclofen 2068m57m53mnd 30mg42m.  For severe pain, ok to take tramadol 50mg 66m prn  Return to clinic in 6 weeks, or sooner as needed  The duration of this appointment visit was 30 minutes of face-to-face time with the patient.  Greater than 50% of this time was spent in counseling, explanation of diagnosis, planning of further management, and coordination of care.   Thank you for allowing me to participate in patient's care.  If I can answer any additional questions, I would be pleased to do so.    Sincerely,    Rhydian Baldi K. Reuven Braver,Posey Pronto

## 2014-07-01 NOTE — Patient Instructions (Addendum)
1.  EKG  2.  Check blood work 3.  I will contact you once I have the results of the EKG and have spoken to your cardiologist about potential medications for your cramps 4.  Continue tramadol 50mg  twice daily for pain 5.  Referral to Dr. Hulan Saas 6.  Return to clinic in 6 weeks

## 2014-07-04 ENCOUNTER — Ambulatory Visit (INDEPENDENT_AMBULATORY_CARE_PROVIDER_SITE_OTHER): Payer: Medicare Other | Admitting: *Deleted

## 2014-07-04 DIAGNOSIS — Z006 Encounter for examination for normal comparison and control in clinical research program: Secondary | ICD-10-CM | POA: Diagnosis not present

## 2014-07-04 NOTE — Progress Notes (Signed)
Pt in today to get an EKG per Dr. Narda Amber. EKG performed and signed off by Truitt Merle, NP. Pt given original copy.

## 2014-07-14 ENCOUNTER — Encounter: Payer: Self-pay | Admitting: Family Medicine

## 2014-07-14 ENCOUNTER — Ambulatory Visit (INDEPENDENT_AMBULATORY_CARE_PROVIDER_SITE_OTHER): Payer: Medicare Other | Admitting: Family Medicine

## 2014-07-14 VITALS — BP 132/80 | HR 53 | Ht 70.0 in | Wt 174.0 lb

## 2014-07-14 DIAGNOSIS — M9902 Segmental and somatic dysfunction of thoracic region: Secondary | ICD-10-CM

## 2014-07-14 DIAGNOSIS — E559 Vitamin D deficiency, unspecified: Secondary | ICD-10-CM

## 2014-07-14 DIAGNOSIS — M999 Biomechanical lesion, unspecified: Secondary | ICD-10-CM | POA: Insufficient documentation

## 2014-07-14 DIAGNOSIS — R253 Fasciculation: Secondary | ICD-10-CM | POA: Diagnosis not present

## 2014-07-14 DIAGNOSIS — M899 Disorder of bone, unspecified: Secondary | ICD-10-CM | POA: Insufficient documentation

## 2014-07-14 DIAGNOSIS — M9901 Segmental and somatic dysfunction of cervical region: Secondary | ICD-10-CM | POA: Diagnosis not present

## 2014-07-14 DIAGNOSIS — M9908 Segmental and somatic dysfunction of rib cage: Secondary | ICD-10-CM

## 2014-07-14 DIAGNOSIS — M5417 Radiculopathy, lumbosacral region: Secondary | ICD-10-CM | POA: Diagnosis not present

## 2014-07-14 DIAGNOSIS — I25119 Atherosclerotic heart disease of native coronary artery with unspecified angina pectoris: Secondary | ICD-10-CM | POA: Diagnosis not present

## 2014-07-14 DIAGNOSIS — M549 Dorsalgia, unspecified: Secondary | ICD-10-CM | POA: Diagnosis not present

## 2014-07-14 DIAGNOSIS — G8310 Monoplegia of lower limb affecting unspecified side: Secondary | ICD-10-CM | POA: Diagnosis not present

## 2014-07-14 MED ORDER — DOXYCYCLINE HYCLATE 100 MG PO TABS
100.0000 mg | ORAL_TABLET | Freq: Two times a day (BID) | ORAL | Status: AC
Start: 2014-07-14 — End: 2014-07-21

## 2014-07-14 NOTE — Therapy (Signed)
[facility] [address] [place], [place], [address] Phone: [phone]   Fax:  [phone]  Patient Details  Name: Andrew Joyce MRN: [id number] Date of Birth: [birth year] Referring Provider:  No ref. provider found  Encounter Date: 07/14/2014  PHYSICAL THERAPY DISCHARGE SUMMARY  Visits from Start of Care: 5  Current functional level related to goals / functional outcomes:     PT Long Term Goals - 06/24/14 1352    PT LONG TERM GOAL #1   Title Pt will be independent in HEP to improve strength and to decrease pain. Target date: 06/28/14   Status Achieved   PT LONG TERM GOAL #2   Title Pt will report back pain is </=2/10 during ADLs in order to perform household duties. Target date: 06/28/14.   Status Not Met   PT LONG TERM GOAL #3   Title Pt will ambulate 1000' over even/uneven terrain with equal arm swing and increased trunk/hip rotation. Target date: 06/28/14.   Status Achieved   PT LONG TERM GOAL #4   Title Pt will perform standing activities for >10 minutes, reaching outside BOS,with 0/10 pain to perform ADLs at home. Target date: 06/28/14.   Status Not Met        Remaining deficits: Back pain and LE weakness. PT discharged pt, as pt was performing HEP and participating in PT but not seeing much functional change in pain. PT recommended pt see MD to determine if there is another underlying pathology. If it is determined pt's back pain is orthopedic in nature he may benefit form orthopedic PT with dry needling.   Education / Equipment: HEP  Plan: Patient agrees to discharge.  Patient goals were partially met. Patient is being discharged due to lack of progress.  ?????       Niel Peretti L 07/14/2014, 9:27 AM  [facility] [address] [place], [place], [address] Phone: [phone]   Fax:  [phone]

## 2014-07-14 NOTE — Progress Notes (Signed)
Pre visit review using our clinic review tool, if applicable. No additional management support is needed unless otherwise documented below in the visit note. 

## 2014-07-14 NOTE — Assessment & Plan Note (Signed)
Believe some of this is secondary to muscle imbalances. Patient does have strong to [facility] but does have weak scapular muscles. We discussed home exercises and patient did work with Product/process development scientist. We discussed icing regimen in which activities to potentially avoid. Patient is going to try a topical anti-inflammatory with a stronger percentile. Patient has not tried over-the-counter natural supplementation decrease inflammation as well. We discussed with patient's positive Lyme titer we should treat as well. Patient will do this for a total of 3 weeks. Patient will try to make these changes and come back and see me again in 3 weeks. Patient gets benefit from manipulation and we will continue that on a regular basis. Even though patient's imaging does show severe spinal stenosis at L4-L5 this is not where patient is having difficulty and has a negative straight leg test today. Patient will follow-up with his other providers for his other problems.

## 2014-07-14 NOTE — Patient Instructions (Addendum)
Nice to meet you Start the antibiotic for Lymes for 3weeks Rehab exercises 3x/week pennsaid pinkie amount topically 2 times daily as needed.  Turmeric 500mg  twice daily Ice 20 minutes 2 times daily. Usually after activity and before bed. We will check vitamin D level.  See me again in 3 weeks.   Standing:  Secure a rubber exercise band/tubing so that it is at the height of your shoulders when you are either standing or sitting on a firm arm-less chair.  Grasp an end of the band/tubing in each hand and have your palms face each other. Straighten your elbows and lift your hands straight in front of you at shoulder height. Step back away from the secured end of band/tubing until it becomes tense.  Squeeze your shoulder blades together. Keeping your elbows locked and your hands at shoulder-height, bring your hands out to your side.  Hold __________ seconds. Slowly ease the tension on the band/tubing as you reverse the directions and return to the starting position. Repeat __________ times. Complete this exercise __________ times per day. STRENGTH - Scapular Retractors  Secure a rubber exercise band/tubing so that it is at the height of your shoulders when you are either standing or sitting on a firm arm-less chair.  With a palm-down grip, grasp an end of the band/tubing in each hand. Straighten your elbows and lift your hands straight in front of you at shoulder height. Step back away from the secured end of band/tubing until it becomes tense.  Squeezing your shoulder blades together, draw your elbows back as you bend them. Keep your upper arm lifted away from your body throughout the exercise.  Hold __________ seconds. Slowly ease the tension on the band/tubing as you reverse the directions and return to the starting position. Repeat __________ times. Complete this exercise __________ times per day. STRENGTH - Shoulder Extensors   Secure a rubber exercise band/tubing so that it is at the  height of your shoulders when you are either standing or sitting on a firm arm-less chair.  With a thumbs-up grip, grasp an end of the band/tubing in each hand. Straighten your elbows and lift your hands straight in front of you at shoulder height. Step back away from the secured end of band/tubing until it becomes tense.  Squeezing your shoulder blades together, pull your hands down to the sides of your thighs. Do not allow your hands to go behind you.  Hold for __________ seconds. Slowly ease the tension on the band/tubing as you reverse the directions and return to the starting position. Repeat __________ times. Complete this exercise __________ times per day.  STRENGTH - Scapular Retractors and External Rotators  Secure a rubber exercise band/tubing so that it is at the height of your shoulders when you are either standing or sitting on a firm arm-less chair.  With a palm-down grip, grasp an end of the band/tubing in each hand. Bend your elbows 90 degrees and lift your elbows to shoulder height at your sides. Step back away from the secured end of band/tubing until it becomes tense.  Squeezing your shoulder blades together, rotate your shoulder so that your upper arm and elbow remain stationary, but your fists travel upward to head-height.  Hold __________ for seconds. Slowly ease the tension on the band/tubing as you reverse the directions and return to the starting position. Repeat __________ times. Complete this exercise __________ times per day.  STRENGTH - Scapular Retractors and External Rotators, Rowing  Secure a rubber exercise band/tubing  so that it is at the height of your shoulders when you are either standing or sitting on a firm arm-less chair.  With a palm-down grip, grasp an end of the band/tubing in each hand. Straighten your elbows and lift your hands straight in front of you at shoulder height. Step back away from the secured end of band/tubing until it becomes  tense.  Step 1: Squeeze your shoulder blades together. Bending your elbows, draw your hands to your chest as if you are rowing a boat. At the end of this motion, your hands and elbow should be at shoulder-height and your elbows should be out to your sides.  Step 2: Rotate your shoulder to raise your hands above your head. Your forearms should be vertical and your upper-arms should be horizontal.  Hold for __________ seconds. Slowly ease the tension on the band/tubing as you reverse the directions and return to the starting position. Repeat __________ times. Complete this exercise __________ times per day.  STRENGTH - Scapular Retractors and Elevators  Secure a rubber exercise band/tubing so that it is at the height of your shoulders when you are either standing or sitting on a firm arm-less chair.  With a thumbs-up grip, grasp an end of the band/tubing in each hand. Step back away from the secured end of band/tubing until it becomes tense.  Squeezing your shoulder blades together, straighten your elbows and lift your hands straight over your head.  Hold for __________ seconds. Slowly ease the tension on the band/tubing as you reverse the directions and return to the starting position. Repeat __________ times. Complete this exercise __________ times per day.  Document Released: 12/24/2004 Document Revised: 03/18/2011 Document Reviewed: 04/07/2008 [facility] Patient Information 2015 [place], [place]. This information is not intended to replace advice given to you by your health care provider. Make sure you discuss any questions you have with your health care provider.

## 2014-07-14 NOTE — Assessment & Plan Note (Signed)
Decision today to treat with OMT was based on Physical Exam  After verbal consent patient was treated with HVLA, ME techniques in cervical, thoracic and rib areas  Patient tolerated the procedure well with improvement in symptoms  Patient given exercises, stretches and lifestyle modifications  See medications in patient instructions if given  Patient will follow up in 3 weeks

## 2014-07-14 NOTE — Progress Notes (Signed)
[name] [place], [place] [address] Phone: [phone] Subjective:    I'm seeing this patient by the request  of:  Mauricio Po, FNP Posey Pronto MD.   CC: Back pain  [id number] Andrew Joyce is a 54 y.o. male coming in with complaint of back pain. Patient is being seen by neurology for multiple different things and is had chronic irritated back pain for quite some time. Patient has had a significant workup for this previously. Patient has had x-ray showing the patient does have severe osteophytic changes mostly of the lumbar spine and somewhat of the thoracic spine. Patient also has an MRI showing spinal stenosis at L4-L5 with foraminal stenosis as well as this level. These were reviewed by me today.  Patient also states that most of his pain seems to be in the upper back just under the shoulder blade on the right side. States that it only happens with repetitive activity such as when he is full in closer doing the cooking. Patient states that is localized and starts with more of a spasm. Patient is taking baclofen for fasciculations in his legs. Patient states that this does not seem to affect this area. Patient was given topical anti-inflammatory's with no significant benefit. Patient cannot take anti-inflammatory secondary to patient's cardiovascular issues. Patient is also on Plavix. Patient states that this pain can use her bed that he has to go down. After 30 minutes it seems to resolve somewhat but then when he starts activities again he will have spasms intermittently throughout the time. Denies any rash, denies any swelling in the area, does not remember any true injury. Rates the severity of pain when it occurs as 9 out of 10. Patient has been sent to physical therapy which she was discharged because he was not making any significant improvement. Denies any nighttime awakening.  Past Medical History  Diagnosis Date  . Coronary artery  disease     a. s/p CABG in 2008; b.  s/p cath in 2011 for med rx.;  c.  NSTEMI (02/2011):  LHC (02/2011):  LAD occluded, distal LAD filled via left to left collaterals, distal CFX 40-50%, proximal RCA occluded (right to right collaterals), distal RCA occluded, S-RCA occluded proximally, S-OM1/OM2 occluded (new from 2011 - culprit), L-LAD ok, ant apical HK, EF 45-50% - med Rx.  [name] Dyslipidemia   . Hypertension   . Recurrent UTI   . History of renal stone   . Bladder exstrophy      Congenital- had multiple surgeries as a child  . Chronic kidney disease (CKD), stage II (mild)   . Pancreatitis   . Hx of echocardiogram     Echo (05/2013):  EF 55-60%, inf HK, mild AI, normal RVSF, RVSP 28 mmHg  . Hx of cardiovascular stress test     Nuclear (06/2009):  EF 71%, small area of infarct/ischemia in inferior wall-unchanged from prior   Past Surgical History  Procedure Laterality Date  . Cardiac catheterization  07/28/2009    EF 60%; Grafts patent except for SVG to the AM and PD. Native RCA is occluded.  . Cardiac catheterization  03/20/2006    EF 60-65%  . Coronary artery bypass graft  03/2006    LIMA GRAFT TO LAD, SAPHENOUS VEIN GRAFT TO THE ACUTE MARGINAL BRANCH THE RIGHT CORONARY, SAPHENOUS VEIN GRAFT TO THE PDA, SEQUENTIAL VEIN GRAFT TO THE FIRST AND SECOND OBTUSE MARGINAL VESSELS  . Cardiovascular stress test  06/12/2009  EF 71%, SMALL AREA OF INFARCT/ISCHEMIA IN INFERIOR WALL; FELT TO NOT BE CHANGED.  [name] Left heart catheterization with coronary angiogram N/A 02/26/2011    Procedure: LEFT HEART CATHETERIZATION WITH CORONARY ANGIOGRAM;  Surgeon: Thayer Headings, MD;  Location: [facility] CATH LAB;  Service: Cardiovascular;  Laterality: N/A;   History  Substance Use Topics  . Smoking status: Never Smoker   . Smokeless tobacco: Not on file  . Alcohol Use: No   No Known Allergies Family History  Problem Relation Age of Onset  . Hyperlipidemia Mother     Living, 74  . Hypertension Mother   . Hyperlipidemia  Father     Living 15  . Hypertension Father   . Healthy Sister   . Healthy Brother     Recent workup in November digital patient having a positive Lyme titer as well as an elevated sedimentation rate      Past medical history, social, surgical and family history all reviewed in electronic medical record.   Review of Systems: No headache, visual changes, nausea, vomiting, diarrhea, constipation, dizziness, abdominal pain, skin rash, fevers, chills, night sweats, weight loss, swollen lymph nodes, body aches, joint swelling, muscle aches, chest pain, shortness of breath, mood changes.   Objective Blood pressure 132/80, pulse 53, height 5\' 10"  (1.778 m), weight 174 lb (78.926 kg), SpO2 97 %.  General: No apparent distress alert and oriented x3 mood and affect normal, dressed appropriately.  HEENT: Pupils equal, extraocular movements intact  Respiratory: Patient's speak in full sentences and does not appear short of breath  Cardiovascular: No lower extremity edema, non tender, no erythema  Skin: Warm dry intact with no signs of infection or rash on extremities or on axial skeleton.  Abdomen: Soft nontender  Neuro: Cranial nerves II through XII are intact, neurovascularly intact in all extremities with 2+ DTRs and 2+ pulses.  Lymph: No lymphadenopathy of posterior or anterior cervical chain or axillae bilaterally.  Gait normal with good balance and coordination.  MSK:  Non tender with full range of motion and good stability and symmetric strength and tone of  elbows, wrist, hip, knee and ankles bilaterally.  Shoulder:right  Inspection reveals no abnormalities, atrophy or asymmetry. Palpation is normal with no tenderness over AC joint or bicipital groove. ROM is full in all planes. Rotator cuff strength normal throughout. No signs of impingement with negative Neer and Hawkin's tests, empty can sign. Speeds and Yergason's tests normal. No labral pathology noted with negative Obrien's,  negative clunk and good stability. Significant dysfunction of the patient's scapula on the right side without winging No painful arc and no drop arm sign. No apprehension sign Back Exam:  Inspection: Mild decrease in lordosis Motion: Flexion 45 deg, Extension 25 deg, Side Bending to 35 deg bilaterally,  Rotation to 35 deg bilaterally  SLR laying: Negative  XSLR laying: Negative  Palpable tenderness: Mild tenderness over the eighth rib on the right side. FABER: negative. Sensory change: Gross sensation intact to all lumbar and sacral dermatomes.  Reflexes: 2+ at both patellar tendons, 2+ at achilles tendons, Babinski's downgoing.  Strength at foot  Plantar-flexion: 5/5 Dorsi-flexion: 5/5 Eversion: 5/5 Inversion: 5/5  Leg strength  Quad: 5/5 Hamstring: 5/5 Hip flexor: 5/5 Hip abductors: 4/5  Gait unremarkable.  Osteopathic findings C4 flexed rotated and side bent left T1 extended rotated and side bent left T8 extended rotated and side bent right with inhaled eighth rib   Procedure Note 97110; 15 minutes spent for Therapeutic exercises as stated  in above notes.  This included exercises focusing on stretching, strengthening, with significant focus on eccentric aspects. Shoulder Exercises that included:  Basic scapular stabilization to include adduction and depression of scapula Scaption, focusing on proper movement and good control Internal and External rotation utilizing a theraband, with elbow tucked at side entire time Rows with theraband  Proper technique shown and discussed handout in great detail with ATC.  All questions were discussed and answered.     Impression and Recommendations:     This case required medical decision making of moderate complexity.

## 2014-07-15 LAB — COMPREHENSIVE METABOLIC PANEL
ALBUMIN: 4 g/dL (ref 3.5–5.2)
ALT: 22 U/L (ref 0–53)
AST: 21 U/L (ref 0–37)
Alkaline Phosphatase: 62 U/L (ref 39–117)
BILIRUBIN TOTAL: 0.5 mg/dL (ref 0.2–1.2)
BUN: 39 mg/dL — AB (ref 6–23)
CO2: 26 mEq/L (ref 19–32)
Calcium: 9.2 mg/dL (ref 8.4–10.5)
Chloride: 104 mEq/L (ref 96–112)
Creat: 1.21 mg/dL (ref 0.50–1.35)
GLUCOSE: 115 mg/dL — AB (ref 70–99)
Potassium: 4.1 mEq/L (ref 3.5–5.3)
Sodium: 140 mEq/L (ref 135–145)
Total Protein: 6.9 g/dL (ref 6.0–8.3)

## 2014-07-15 LAB — CK: Total CK: 252 U/L — ABNORMAL HIGH (ref 7–232)

## 2014-08-04 ENCOUNTER — Encounter: Payer: Self-pay | Admitting: Family Medicine

## 2014-08-04 ENCOUNTER — Ambulatory Visit (INDEPENDENT_AMBULATORY_CARE_PROVIDER_SITE_OTHER): Payer: Medicare Other | Admitting: Family Medicine

## 2014-08-04 VITALS — BP 112/80 | HR 52 | Ht 70.0 in | Wt 172.0 lb

## 2014-08-04 DIAGNOSIS — M9908 Segmental and somatic dysfunction of rib cage: Secondary | ICD-10-CM

## 2014-08-04 DIAGNOSIS — M899 Disorder of bone, unspecified: Secondary | ICD-10-CM | POA: Diagnosis not present

## 2014-08-04 DIAGNOSIS — M9903 Segmental and somatic dysfunction of lumbar region: Secondary | ICD-10-CM

## 2014-08-04 DIAGNOSIS — I25119 Atherosclerotic heart disease of native coronary artery with unspecified angina pectoris: Secondary | ICD-10-CM | POA: Diagnosis not present

## 2014-08-04 DIAGNOSIS — M999 Biomechanical lesion, unspecified: Secondary | ICD-10-CM

## 2014-08-04 DIAGNOSIS — M9902 Segmental and somatic dysfunction of thoracic region: Secondary | ICD-10-CM | POA: Diagnosis not present

## 2014-08-04 NOTE — Progress Notes (Signed)
Pre visit review using our clinic review tool, if applicable. No additional management support is needed unless otherwise documented below in the visit note. 

## 2014-08-04 NOTE — Assessment & Plan Note (Signed)
Still believe that this is likely more patient's difficulty. Encourage him to continue the exercises. We discussed postural changes. Patient does have muscle relaxer if needed for breakthrough pain as well as the tramadol. If patient has any worsening symptoms I do think that the projecting disc to the thoracic spine seen on MRI could be within the differential and may went to consider an epidural Sterritt injection for diagnostic as well as therapeutic purposes. Patient continues to respond fairly well to osteopathic manipulation and will come back and see me again in 4 weeks for further evaluation and treatment.

## 2014-08-04 NOTE — Assessment & Plan Note (Signed)
Decision today to treat with OMT was based on Physical Exam  After verbal consent patient was treated with HVLA, ME techniques in cervical, thoracic and rib areas  Patient tolerated the procedure well with improvement in symptoms  Patient given exercises, stretches and lifestyle modifications  See medications in patient instructions if given  Patient will follow up in 4 weeks

## 2014-08-04 NOTE — Progress Notes (Signed)
[name] [place],  [address] Phone: [phone] Subjective:    I'm seeing this patient by the request  of:  Mauricio Po, FNP Posey Pronto MD.   CC: Back pain  [name] Andrew Joyce is a 54 y.o. male coming in with complaint of back pain. Patient is being seen by neurology for multiple different things and is had chronic irritated back pain for quite some time. Patient was seen previously and was found to have more of a scapular dysfunction. Patient does have some mild protruding discs of the thoracic spine seen on MRI previously. Patient states that he has not made any significant improvement. Doing the exercises intermittently and has started the vitamin D supplementation. Patient denies any new symptoms. States though that after even standing for greater than 10-15 minutes he starts having some discomfort. Patient states that overall he does think it has improved minorly.    Past Medical History  Diagnosis Date  . Coronary artery disease     a. s/p CABG in 2008; b.  s/p cath in 2011 for med rx.;  c.  NSTEMI (02/2011):  LHC (02/2011):  LAD occluded, distal LAD filled via left to left collaterals, distal CFX 40-50%, proximal RCA occluded (right to right collaterals), distal RCA occluded, S-RCA occluded proximally, S-OM1/OM2 occluded (new from 2011 - culprit), L-LAD ok, ant apical HK, EF 45-50% - med Rx.  [name] Dyslipidemia   . Hypertension   . Recurrent UTI   . History of renal stone   . Bladder exstrophy      Congenital- had multiple surgeries as a child  . Chronic kidney disease (CKD), stage II (mild)   . Pancreatitis   . Hx of echocardiogram     Echo (05/2013):  EF 55-60%, inf HK, mild AI, normal RVSF, RVSP 28 mmHg  . Hx of cardiovascular stress test     Nuclear (06/2009):  EF 71%, small area of infarct/ischemia in inferior wall-unchanged from prior   Past Surgical History  Procedure Laterality Date  . Cardiac  catheterization  07/28/2009    EF 60%; Grafts patent except for SVG to the AM and PD. Native RCA is occluded.  . Cardiac catheterization  03/20/2006    EF 60-65%  . Coronary artery bypass graft  03/2006    LIMA GRAFT TO LAD, SAPHENOUS VEIN GRAFT TO THE ACUTE MARGINAL BRANCH THE RIGHT CORONARY, SAPHENOUS VEIN GRAFT TO THE PDA, SEQUENTIAL VEIN GRAFT TO THE FIRST AND SECOND OBTUSE MARGINAL VESSELS  . Cardiovascular stress test  06/12/2009    EF 71%, SMALL AREA OF INFARCT/ISCHEMIA IN INFERIOR WALL; FELT TO NOT BE CHANGED.  [name] Left heart catheterization with coronary angiogram N/A 02/26/2011    Procedure: LEFT HEART CATHETERIZATION WITH CORONARY ANGIOGRAM;  Surgeon: Thayer Headings, MD;  Location: [facility] CATH LAB;  Service: Cardiovascular;  Laterality: N/A;   History  Substance Use Topics  . Smoking status: Never Smoker   . Smokeless tobacco: Not on file  . Alcohol Use: No   No Known Allergies Family History  Problem Relation Age of Onset  . Hyperlipidemia Mother     Living, 31  . Hypertension Mother   . Hyperlipidemia Father     Living 70  . Hypertension Father   . Healthy Sister   . Healthy Brother     Recent workup in November digital patient having a positive Lyme titer as well as an elevated sedimentation rate patient has been treated for this now.  Past medical history, social, surgical and family history all reviewed in electronic medical record.   Review of Systems: No headache, visual changes, nausea, vomiting, diarrhea, constipation, dizziness, abdominal pain, skin rash, fevers, chills, night sweats, weight loss, swollen lymph nodes, body aches, joint swelling, muscle aches, chest pain, shortness of breath, mood changes.   Objective Blood pressure 112/80, pulse 52, height 5\' 10"  (1.778 m), weight 172 lb (78.019 kg), SpO2 98 %.  General: No apparent distress alert and oriented x3 mood and affect normal, dressed appropriately.  HEENT: Pupils equal, extraocular movements intact    Respiratory: Patient's speak in full sentences and does not appear short of breath  Cardiovascular: No lower extremity edema, non tender, no erythema  Skin: Warm dry intact with no signs of infection or rash on extremities or on axial skeleton.  Abdomen: Soft nontender  Neuro: Cranial nerves II through XII are intact, neurovascularly intact in all extremities with 2+ DTRs and 2+ pulses.  Lymph: No lymphadenopathy of posterior or anterior cervical chain or axillae bilaterally.  Gait normal with good balance and coordination.  MSK:  Non tender with full range of motion and good stability and symmetric strength and tone of  elbows, wrist, hip, knee and ankles bilaterally.  Significant dysfunction of the patient's scapula on the right side without winging  Back Exam:  Inspection: Mild decrease in lordosis Motion: Flexion 45 deg, Extension 25 deg, Side Bending to 35 deg bilaterally,  Rotation to 35 deg bilaterally  SLR laying: Negative  XSLR laying: Negative  Palpable tenderness: Continued mild tenderness over the thoracic spine FABER: negative. Sensory change: Gross sensation intact to all lumbar and sacral dermatomes.  Reflexes: 2+ at both patellar tendons, 2+ at achilles tendons, Babinski's downgoing.  Strength at foot  Plantar-flexion: 5/5 Dorsi-flexion: 5/5 Eversion: 5/5 Inversion: 5/5  Leg strength  Quad: 5/5 Hamstring: 5/5 Hip flexor: 5/5 Hip abductors: 4/5  Gait unremarkable.  Osteopathic findings C4 flexed rotated and side bent left T1 extended rotated and side bent left T8 extended rotated and side bent right with inhaled eighth rib       Impression and Recommendations:     This case required medical decision making of moderate complexity.

## 2014-08-04 NOTE — Patient Instructions (Addendum)
Good to see you COntinue the exercises and the vitamins for now.  Finish the [employer] can help when needed See me again in 4 weeks

## 2014-08-05 DIAGNOSIS — N400 Enlarged prostate without lower urinary tract symptoms: Secondary | ICD-10-CM | POA: Diagnosis not present

## 2014-08-05 DIAGNOSIS — N12 Tubulo-interstitial nephritis, not specified as acute or chronic: Secondary | ICD-10-CM | POA: Diagnosis not present

## 2014-08-18 ENCOUNTER — Encounter: Payer: Self-pay | Admitting: Neurology

## 2014-08-18 ENCOUNTER — Ambulatory Visit (INDEPENDENT_AMBULATORY_CARE_PROVIDER_SITE_OTHER): Payer: Medicare Other | Admitting: Neurology

## 2014-08-18 VITALS — BP 124/80 | HR 53 | Ht 70.0 in | Wt 171.4 lb

## 2014-08-18 DIAGNOSIS — G8310 Monoplegia of lower limb affecting unspecified side: Secondary | ICD-10-CM | POA: Diagnosis not present

## 2014-08-18 DIAGNOSIS — I25119 Atherosclerotic heart disease of native coronary artery with unspecified angina pectoris: Secondary | ICD-10-CM | POA: Diagnosis not present

## 2014-08-18 DIAGNOSIS — R253 Fasciculation: Secondary | ICD-10-CM

## 2014-08-18 NOTE — Progress Notes (Signed)
Follow-up Visit   Date: 08/18/2014    Andrew Joyce MRN: [id number] DOB: 03/30/1960   Interim History: Andrew Joyce is a 54 y.o. left-handed Caucasian male with congenital exstrophy status post multiple surgeries complicated by chronic UTI, CAD s/p CABG (2008), hyperlipidemia, hypertension, and stage II CKD returning to the clinic for follow-up of left leg twitches and weakness.   History of present illness: Starting in October 2015, he developed "firing nerves" and cramping of the left legs. Symptoms are worse at night and keep him from sleeping because of painful cramps. They usually last several minutes. He has no associated weakness or similar symptoms on the right leg or arms. He went to see his PCP who recommended melatonin, muscle relaxants (robaxin), ambien, and heat packs. He also tried taking valium at night which did not help much.   Follow-up 12/15/2013: He feels that the agitation of the leg and cramps has improved since starting baclofen and he is able to sleep better through the night.  There has been no change in his leg weakness or twitches.  He has noticed intermittent cramping of the left hand, especially when driving.    UPDATE 01/25/2014:  He reports having cramps about 3-4 times of the night.  He tried his family member's tramadol which completely alleviated his cramps.  He has not noticed left leg weakness to be a problem, but his muscle twitches remain unchanged.   UPDATE 05/03/2014:   New twitches in his right lower leg, but has not noticed any weakness. He reports having new right mid-back pain, described as severe and excruciating ache especially with activity, such as washing dishes, folding clothes, playing the piano, etc.  He has severe achy and soreness of his back.    UPDATE 07/01/2014:  He was discharged from PT early because there was no improvement in his back pain with exercises recommended. He continues to have localized midback pain  which is worsening because it takes less activity for it to trigger symptoms and intensity is much worse.  It is improved when he lays down and rests, but within several minutes of doing any activies with his hands (cooking, cleaning, folding laundry, etc) the achy pain is aggravated.  MRI thoracic spine did not show any nerve impingement.     UPDATE 08/18/2014:   Muscle cramps are stable, no worsening.  His back pain is also no worse than before and he has noticed some benefith with doing the exercises as recommended by Dr. Tamala Julian.  Because his back pain remains persistent, he is planning on having steroid injection to the region to see if this helps.  He has no new complaints.  Specifically, no new weakness.  He has noticed a few muscle twitches in his left triceps, but this is rare.     Medications:  Current Outpatient Prescriptions on File Prior to Visit  Medication Sig Dispense Refill  . amLODipine (NORVASC) 5 MG tablet Take 1 tablet (5 mg total) by mouth daily. 30 tablet 6  . aspirin 81 MG tablet Take 1 tablet (81 mg total) by mouth daily.    . baclofen (LIORESAL) 10 MG tablet Take 10 mg by mouth 3 (three) times daily. Take 2 tablets in the morning, 1 at lunchtime and 3 at bedtime.    . fenofibrate 54 MG tablet Take 1 tablet (54 mg total) by mouth daily. 30 tablet 6  . fish oil-omega-3 fatty acids 1000 MG capsule Take 2 capsules (2 g total) by  mouth 2 (two) times daily.    . isosorbide mononitrate (IMDUR) 60 MG 24 hr tablet TAKE ONE & ONE-HALF TABLETS BY MOUTH ONCE DAILY 45 tablet 6  . losartan (COZAAR) 100 MG tablet Take 1 tablet (100 mg total) by mouth daily. 30 tablet 11  . metoprolol (LOPRESSOR) 50 MG tablet Take 1 tablet (50 mg total) by mouth 2 (two) times daily. 60 tablet 8  . nitroGLYCERIN (NITROSTAT) 0.4 MG SL tablet Place 1 tablet (0.4 mg total) under the tongue every 5 (five) minutes as needed. For chest pain 25 tablet 3  . pravastatin (PRAVACHOL) 40 MG tablet Take 1 tablet (40 mg  total) by mouth daily. 30 tablet 6  . traMADol (ULTRAM) 50 MG tablet Take 1 tablet (50 mg total) by mouth every 12 (twelve) hours as needed for moderate pain. 60 tablet 5   No current facility-administered medications on file prior to visit.    Allergies: No Known Allergies  Review of Systems:  CONSTITUTIONAL: No fevers, chills, night sweats, or weight loss.  EYES: No visual changes or eye pain ENT: No hearing changes.  No history of nose bleeds.   RESPIRATORY: No cough, wheezing and shortness of breath.   CARDIOVASCULAR: Negative for chest pain, and palpitations.   GI: Negative for abdominal discomfort, blood in stools or black stools.  No recent change in bowel habits.   GU:  No history of incontinence.   MUSCLOSKELETAL: No history of joint pain or swelling.  +cramps.   SKIN: Negative for lesions, rash, and itching.   ENDOCRINE: Negative for cold or heat intolerance, polydipsia or goiter.   PSYCH:  No depression or anxiety symptoms.   NEURO: As Above.   Vital Signs:  BP 124/80 mmHg  Pulse 53  Ht 5' 10"  (1.778 m)  Wt 171 lb 6 oz (77.735 kg)  BMI 24.59 kg/m2  SpO2 98%  Neurological Exam: MENTAL STATUS including orientation to time, place, person, recent and remote memory, attention span and concentration, language, and fund of knowledge is normal.  Speech is not dysarthric.  CRANIAL NERVES:  Pupils equal round and reactive to light.  Face is symmetric.   MOTOR:  Moderate atrophy of the left lower leg (quads, medial gastroc). Prominent and active fasciculations of the left > right calf. Normal tone.   Right Upper Extremity:    Left Upper Extremity:    Deltoid  5/5   Deltoid  5/5   Biceps  5/5   Biceps  5/5   Triceps  5/5   Triceps  5/5   Wrist extensors  5/5   Wrist extensors  5/5   Wrist flexors  5/5   Wrist flexors  5/5   Finger extensors  5/5   Finger extensors  5/5   Finger flexors  5/5   Finger flexors  5/5   Dorsal interossei  5/5   Dorsal interossei  5/5     Abductor pollicis  5/5   Abductor pollicis  5/5   Tone (Ashworth scale)  0  Tone (Ashworth scale)  0   Right Lower Extremity:    Left Lower Extremity:    Hip flexors  5/5   Hip flexors  5/5   Hip extensors  5/5   Hip extensors  5/5   Knee flexors  5/5   Knee flexors  4+/5   Knee extensors  5/5   Knee extensors  5-/5   Dorsiflexors  5/5   Dorsiflexors  4+/5   Plantarflexors  5/5  Plantarflexors  5/5   Toe extensors  5/5   Toe extensors  4+/5   Toe flexors  5/5   Toe flexors  5/5   Tone (Ashworth scale)  0  Tone (Ashworth scale)  0   MSRs:  Right                                                                 Left brachioradialis 1+  brachioradialis 1+  biceps 1+  biceps 1+  triceps 1+  triceps 1+  patellar 2+  patellar 3+  ankle jerk 2+  ankle jerk 1+  Hoffman no  Hoffman no  plantar response down  plantar response down   COORDINATION/GAIT:   Gait narrow based and stable. Stressed gait intact.  Data: EMG 12/15/2013: 1. Chronic C6 radiculopathy affecting the left upper extremity. 2. Left ulnar neuropathy with slowing across the elbow, purely demyelinating in type. 3. Multilevel intraspinal canal lesion affecting L2-S1 myotomes bilaterally. These findings are moderate in degree electrically and worse on the left side involving the L2-L4 myotome. Active changes are also seen affecting the medial gastrocnemius muscle. These findings are insufficient for the diagnosis of motor neuron disease, recommend repeat electrodiagnostic testing in 6 months if clinically indicated.  Labs 07/14/2014:  CK 252*, vitamin D 22* Labs 11/25/2013:  CK 215, aldolase 9.2*, PTH 34, SPEP/UPEP with IFE no Mo protein, copper 116, zinc 67, CRP 0.6*, ESR 47  CT lumbar spine 12/17/2013: 1. Severe degenerative disc disease with disc height loss at L5-S1 with a mild broad-based disc osteophyte complex and right foraminal narrowing. 2. Mild broad-based disc bulge at L4-5 without foraminal or central canal  stenosis.  MRI thoracic spine wo contrast 05/29/2014: Small central disc protrusions T3-4 and T7-8 without neural impingement or significant spinal stenosis. No acute abnormality.  MRI lumbar spine wo contrast 05/29/2014:  Mild to moderate spinal stenosis at L4-5 with small central disc protrusion and diffuse disc bulging and spondylosis.  Postop laminectomy left L5-S1 without recurrent disc protrusion. There is spondylosis causing subarticular and foraminal stenosis bilaterally at L5-S1.   IMPRESSION/PLAN: Mr. Aikey is a 54 year-old gentleman returning for evaluation of left leg fasciculations, weakness, and painful cramps.  His exam remains stable today with weakness persistent with left knee flexion, ankle dorsiflexion, and toe extension.  I did not observe any fasciculations in the upper extremities, although they remain active in the L > R gastrocnemius.  With his fasciculations, I am concerned about early motor neuron disease but this was not evident on his EMG from December.  We discussed repeating his EMG going forward. There is mild elevation in CK.  Alternatively, cramp fasiculation syndrome is possible, but I would not expect weakness.  At this time, his muscle cramps are stable on baclofen 22m TID and tramadol 518mBID prn.   Regarding his right back pain, he is has been seeing Dr. [facility] Medicine) for scapular dysfunction and receiving OMT for this which has stabilized his pain.    Return to clinic in 3 months, or sooner as needed  The duration of this appointment visit was 20 minutes of face-to-face time with the patient.  Greater than 50% of this time was spent in counseling, explanation of diagnosis, planning of further management, and coordination of  care.   Thank you for allowing me to participate in patient's care.  If I can answer any additional questions, I would be pleased to do so.    Sincerely,    Donika K. Posey Pronto, DO

## 2014-08-18 NOTE — Patient Instructions (Addendum)
1. Continue medications as you are taking them 2. Return to clinic in 3 months

## 2014-09-01 ENCOUNTER — Ambulatory Visit (INDEPENDENT_AMBULATORY_CARE_PROVIDER_SITE_OTHER): Payer: Medicare Other | Admitting: Family Medicine

## 2014-09-01 ENCOUNTER — Encounter: Payer: Self-pay | Admitting: Family Medicine

## 2014-09-01 VITALS — BP 124/64 | HR 50 | Ht 70.0 in | Wt 173.0 lb

## 2014-09-01 DIAGNOSIS — M999 Biomechanical lesion, unspecified: Secondary | ICD-10-CM

## 2014-09-01 DIAGNOSIS — N390 Urinary tract infection, site not specified: Secondary | ICD-10-CM | POA: Diagnosis not present

## 2014-09-01 DIAGNOSIS — I25119 Atherosclerotic heart disease of native coronary artery with unspecified angina pectoris: Secondary | ICD-10-CM

## 2014-09-01 DIAGNOSIS — M9902 Segmental and somatic dysfunction of thoracic region: Secondary | ICD-10-CM | POA: Diagnosis not present

## 2014-09-01 DIAGNOSIS — M546 Pain in thoracic spine: Secondary | ICD-10-CM | POA: Diagnosis not present

## 2014-09-01 DIAGNOSIS — M9901 Segmental and somatic dysfunction of cervical region: Secondary | ICD-10-CM | POA: Diagnosis not present

## 2014-09-01 DIAGNOSIS — B9620 Unspecified Escherichia coli [E. coli] as the cause of diseases classified elsewhere: Secondary | ICD-10-CM | POA: Diagnosis not present

## 2014-09-01 DIAGNOSIS — N400 Enlarged prostate without lower urinary tract symptoms: Secondary | ICD-10-CM | POA: Diagnosis not present

## 2014-09-01 DIAGNOSIS — M899 Disorder of bone, unspecified: Secondary | ICD-10-CM

## 2014-09-01 DIAGNOSIS — M9908 Segmental and somatic dysfunction of rib cage: Secondary | ICD-10-CM | POA: Diagnosis not present

## 2014-09-01 NOTE — Progress Notes (Signed)
[name] [address], [place] [address] Phone: [phone] Subjective:     CC: Back pain follow up  [id number] Andrew Joyce is a 54 y.o. male coming in with complaint of back pain. Patient is being seen by neurology for multiple different things and is had chronic irritated back pain for quite some time. Patient was seen previously and was found to have more of a scapular dysfunction. Patient does have some mild protruding discs of the thoracic spine seen on MRI previously. Patient states that the home exercises do not seem to be beneficial. Patient's continues to work out on a regular basis. Has no pain with working out. Only seems to be afterwards. Denies any radiation or numbness. States that it seems to be more repetitive activity that seems to give him trouble. Been working on the somewhat. Taking the vitamins only as needed.    Past Medical History  Diagnosis Date  . Coronary artery disease     a. s/p CABG in 2008; b.  s/p cath in 2011 for med rx.;  c.  NSTEMI (02/2011):  LHC (02/2011):  LAD occluded, distal LAD filled via left to left collaterals, distal CFX 40-50%, proximal RCA occluded (right to right collaterals), distal RCA occluded, S-RCA occluded proximally, S-OM1/OM2 occluded (new from 2011 - culprit), L-LAD ok, ant apical HK, EF 45-50% - med Rx.  [name] Dyslipidemia   . Hypertension   . Recurrent UTI   . History of renal stone   . Bladder exstrophy      Congenital- had multiple surgeries as a child  . Chronic kidney disease (CKD), stage II (mild)   . Pancreatitis   . Hx of echocardiogram     Echo (05/2013):  EF 55-60%, inf HK, mild AI, normal RVSF, RVSP 28 mmHg  . Hx of cardiovascular stress test     Nuclear (06/2009):  EF 71%, small area of infarct/ischemia in inferior wall-unchanged from prior   Past Surgical History  Procedure Laterality Date  . Cardiac catheterization  07/28/2009    EF 60%; Grafts patent except for SVG  to the AM and PD. Native RCA is occluded.  . Cardiac catheterization  03/20/2006    EF 60-65%  . Coronary artery bypass graft  03/2006    LIMA GRAFT TO LAD, SAPHENOUS VEIN GRAFT TO THE ACUTE MARGINAL BRANCH THE RIGHT CORONARY, SAPHENOUS VEIN GRAFT TO THE PDA, SEQUENTIAL VEIN GRAFT TO THE FIRST AND SECOND OBTUSE MARGINAL VESSELS  . Cardiovascular stress test  06/12/2009    EF 71%, SMALL AREA OF INFARCT/ISCHEMIA IN INFERIOR WALL; FELT TO NOT BE CHANGED.  [name] Left heart catheterization with coronary angiogram N/A 02/26/2011    Procedure: LEFT HEART CATHETERIZATION WITH CORONARY ANGIOGRAM;  Surgeon: Thayer Headings, MD;  Location: [facility] CATH LAB;  Service: Cardiovascular;  Laterality: N/A;   Social History  Substance Use Topics  . Smoking status: Never Smoker   . Smokeless tobacco: None  . Alcohol Use: No   No Known Allergies Family History  Problem Relation Age of Onset  . Hyperlipidemia Mother     Living, 24  . Hypertension Mother   . Hyperlipidemia Father     Living 41  . Hypertension Father   . Healthy Sister   . Healthy Brother     Recent workup in November digital patient having a positive Lyme titer as well as an elevated sedimentation rate patient has been treated for this now.      Past  medical history, social, surgical and family history all reviewed in electronic medical record.   Review of Systems: No headache, visual changes, nausea, vomiting, diarrhea, constipation, dizziness, abdominal pain, skin rash, fevers, chills, night sweats, weight loss, swollen lymph nodes, body aches, joint swelling, muscle aches, chest pain, shortness of breath, mood changes.   Objective Blood pressure 124/64, pulse 50, height 5\' 10"  (1.778 m), weight 173 lb (78.472 kg), SpO2 98 %.  General: No apparent distress alert and oriented x3 mood and affect normal, dressed appropriately.  HEENT: Pupils equal, extraocular movements intact  Respiratory: Patient's speak in full sentences and does not appear  short of breath  Cardiovascular: No lower extremity edema, non tender, no erythema  Skin: Warm dry intact with no signs of infection or rash on extremities or on axial skeleton.  Abdomen: Soft nontender  Neuro: Cranial nerves II through XII are intact, neurovascularly intact in all extremities with 2+ DTRs and 2+ pulses.  Lymph: No lymphadenopathy of posterior or anterior cervical chain or axillae bilaterally.  Gait normal with good balance and coordination.  MSK:  Non tender with full range of motion and good stability and symmetric strength and tone of  elbows, wrist, hip, knee and ankles bilaterally.  Significant dysfunction of the patient's scapula on the right side without winging  Back Exam:  Inspection: Mild decrease in lordosis Motion: Flexion 45 deg, Extension 25 deg, Side Bending to 35 deg bilaterally,  Rotation to 35 deg bilaterally  SLR laying: Negative  XSLR laying: Negative  Palpable tenderness: Continued mild to moderate tenderness over the thoracic spine and thoracic musculature on the right side FABER: negative. Sensory change: Gross sensation intact to all lumbar and sacral dermatomes.  Reflexes: 2+ at both patellar tendons, 2+ at achilles tendons, Babinski's downgoing.  Strength at foot  Plantar-flexion: 5/5 Dorsi-flexion: 5/5 Eversion: 5/5 Inversion: 5/5  Leg strength  Quad: 5/5 Hamstring: 5/5 Hip flexor: 5/5 Hip abductors: 4/5  Gait unremarkable.   Osteopathic findings C4 flexed rotated and side bent left T1 extended rotated and side bent left elevated first rib T8 extended rotated and side bent right with inhaled eighth rib       Impression and Recommendations:     This case required medical decision making of moderate complexity.

## 2014-09-01 NOTE — Assessment & Plan Note (Signed)
Decision today to treat with OMT was based on Physical Exam  After verbal consent patient was treated with HVLA, ME techniques in cervical, thoracic and rib areas  Patient tolerated the procedure well with improvement in symptoms  Patient given exercises, stretches and lifestyle modifications  See medications in patient instructions if given  Patient will follow up in 3-4 weeks

## 2014-09-01 NOTE — Patient Instructions (Addendum)
Good to see you I am sorry you are still hurting.  Ice and continue the vitamins They will call you on the epidural See me again 2 weeks after the MRI.

## 2014-09-01 NOTE — Progress Notes (Signed)
Pre visit review using our clinic review tool, if applicable. No additional management support is needed unless otherwise documented below in the visit note. 

## 2014-09-01 NOTE — Assessment & Plan Note (Signed)
I still believe that the scapular dysfunction is likely the main cause of patient's pain. We discussed icing regimen as well as continuing the postural control and the home exercises. We discussed possibly decreasing the amount of back exercises he does which patient declined. Patient has not responded any other conservative therapy and does have the per treating disc. We will actually try an epidural for diagnostic and therapeutic purposes. Hopefully that this will be beneficial. Patient has had elevated inflammatory markers previously. This could've been secondary to his cardiovascular risk and we will consider further workup to rule out any autoimmune diseases. He'll discuss at follow-up 2 weeks after the epidural.  Spent  25 minutes with patient face-to-face and had greater than 50% of counseling including as described above in assessment and plan.

## 2014-09-08 ENCOUNTER — Ambulatory Visit
Admission: RE | Admit: 2014-09-08 | Discharge: 2014-09-08 | Disposition: A | Discharge status: Home / Self Care | Payer: Medicare Other | Source: Ambulatory Visit | Attending: Family Medicine | Admitting: Family Medicine

## 2014-09-08 DIAGNOSIS — M546 Pain in thoracic spine: Secondary | ICD-10-CM

## 2014-09-08 MED ORDER — TRIAMCINOLONE ACETONIDE 40 MG/ML IJ SUSP (RADIOLOGY)
60.0000 mg | Freq: Once | INTRAMUSCULAR | Status: AC
  Administered 2014-09-08: 60 mg via EPIDURAL

## 2014-09-08 MED ORDER — IOHEXOL 300 MG/ML  SOLN
1.0000 mL | Freq: Once | INTRAMUSCULAR | Status: DC | PRN
  Administered 2014-09-08: 1 mL via EPIDURAL

## 2014-09-08 NOTE — Discharge Instructions (Signed)

## 2014-09-09 ENCOUNTER — Telehealth: Payer: Self-pay | Admitting: Family

## 2014-09-09 NOTE — Telephone Encounter (Signed)
This sounds like a spinal headache. He needs to notify the people that did the injection that he is having a headache as he may have a leak in his lower back. In the meantime he can try some over-the-counter anti-inflammatories if that is safe for him with respect to his other conditions.

## 2014-09-09 NOTE — Telephone Encounter (Signed)
Pt had an epidural injection yesterday and he was unable to sleep and is having headaches and was told to let Dr. Tamala Julian know if these things happen. He nor Marya Amsler (his PCP) are here today.  He was hoping to get something called in. Pharmacy [facility] on [address].  Can you help with this. Wasn't sure who to send to

## 2014-09-09 NOTE — Telephone Encounter (Signed)
Called pt no answer LMOM RTC.../lmb 

## 2014-09-09 NOTE — Telephone Encounter (Signed)
Pls advise on msg below Dr. Tamala Julian nor Marya Amsler is in office...Andrew Joyce

## 2014-09-13 NOTE — Telephone Encounter (Signed)
Called pt again still no answer LMOM if still having symptoms to give md call back...Andrew Joyce

## 2014-09-28 ENCOUNTER — Ambulatory Visit (INDEPENDENT_AMBULATORY_CARE_PROVIDER_SITE_OTHER): Payer: Medicare Other | Admitting: Family Medicine

## 2014-09-28 ENCOUNTER — Encounter: Payer: Self-pay | Admitting: Family Medicine

## 2014-09-28 VITALS — BP 120/82 | HR 52 | Ht 70.0 in | Wt 169.0 lb

## 2014-09-28 DIAGNOSIS — M2550 Pain in unspecified joint: Secondary | ICD-10-CM | POA: Diagnosis not present

## 2014-09-28 DIAGNOSIS — I25119 Atherosclerotic heart disease of native coronary artery with unspecified angina pectoris: Secondary | ICD-10-CM | POA: Diagnosis not present

## 2014-09-28 DIAGNOSIS — M5010 Cervical disc disorder with radiculopathy, unspecified cervical region: Secondary | ICD-10-CM

## 2014-09-28 DIAGNOSIS — M546 Pain in thoracic spine: Secondary | ICD-10-CM | POA: Diagnosis not present

## 2014-09-28 MED ORDER — GABAPENTIN 100 MG PO CAPS: 200.0000 mg | ORAL_CAPSULE | Freq: Every day | ORAL | Status: DC

## 2014-09-28 NOTE — Patient Instructions (Addendum)
Good to see you Gabapentin 200mg  at night.  ice is your friend Stay active We will get labs and I will release them and if positive we will discuss possibly getting you to rheumatologist.  We will get another epidural See me again in 2-3 weeks after the next epidural.

## 2014-09-28 NOTE — Assessment & Plan Note (Signed)
Patient continues to have the chronic thoracic spine. Patient had a failed epidural at T7 and T8 but did have an MRI showing disc protrusion as well as a T4. We will have patient actually have an epidural at T3-T4 to see if this makes any benefit. Patient was started on gabapentin 200 mg at night if this is neurologically causing some of the discomfort. Discussed with patient at this time to with him being able to work out but has pain with regular daily activities this does not seem to fit any significant diagnosis. Patient has had elevated sedimentation rate previously and I do think an autoimmune workup is necessary. Labs ordered today. Depending on results patient may need a referral to rheumatologist. Patient encouraged to continue the conservative therapy including icing, home exercises and over-the-counter natural supplements. Patient come back and see me again in 2-3 weeks after the epidural to see if he is responding.

## 2014-09-28 NOTE — Progress Notes (Signed)
[name] [address], [place] [address] Phone: [phone] Subjective:     CC: Back pain follow up  GYI:RSWNIOEVOJ Andrew Joyce is a 54 y.o. male coming in with complaint of back pain. Patient is being seen by neurology for multiple different things and is had chronic irritated back pain for quite some time.  Patient was continued to have thoracic back pain. Had MRI showing small central disc protrusions of T3 and 4 as well as T7 and T8. Patient was sent to have an epidural steroid injection. Patient had this accomplished 2 weeks ago. Patient states this is make any significant improvement. Patient states that unfortunately continues to have trouble even with his daily activities. Continues to take the muscle relaxer regularly and taking tramadol for breakthrough pain at least once or twice a day. Denies any new symptoms is worsening a previous symptoms. Cannot even stand long amount of time to do dishes or even play the piano.  Still able to work out regularly.    Past Medical History  Diagnosis Date  . Coronary artery disease     a. s/p CABG in 2008; b.  s/p cath in 2011 for med rx.;  c.  NSTEMI (02/2011):  LHC (02/2011):  LAD occluded, distal LAD filled via left to left collaterals, distal CFX 40-50%, proximal RCA occluded (right to right collaterals), distal RCA occluded, S-RCA occluded proximally, S-OM1/OM2 occluded (new from 2011 - culprit), L-LAD ok, ant apical HK, EF 45-50% - med Rx.  [name] Dyslipidemia   . Hypertension   . Recurrent UTI   . History of renal stone   . Bladder exstrophy      Congenital- had multiple surgeries as a child  . Chronic kidney disease (CKD), stage II (mild)   . Pancreatitis   . Hx of echocardiogram     Echo (05/2013):  EF 55-60%, inf HK, mild AI, normal RVSF, RVSP 28 mmHg  . Hx of cardiovascular stress test     Nuclear (06/2009):  EF 71%, small area of infarct/ischemia in inferior wall-unchanged from prior    Past Surgical History  Procedure Laterality Date  . Cardiac catheterization  07/28/2009    EF 60%; Grafts patent except for SVG to the AM and PD. Native RCA is occluded.  . Cardiac catheterization  03/20/2006    EF 60-65%  . Coronary artery bypass graft  03/2006    LIMA GRAFT TO LAD, SAPHENOUS VEIN GRAFT TO THE ACUTE MARGINAL BRANCH THE RIGHT CORONARY, SAPHENOUS VEIN GRAFT TO THE PDA, SEQUENTIAL VEIN GRAFT TO THE FIRST AND SECOND OBTUSE MARGINAL VESSELS  . Cardiovascular stress test  06/12/2009    EF 71%, SMALL AREA OF INFARCT/ISCHEMIA IN INFERIOR WALL; FELT TO NOT BE CHANGED.  [name] Left heart catheterization with coronary angiogram N/A 02/26/2011    Procedure: LEFT HEART CATHETERIZATION WITH CORONARY ANGIOGRAM;  Surgeon: Thayer Headings, MD;  Location: [facility] CATH LAB;  Service: Cardiovascular;  Laterality: N/A;   Social History  Substance Use Topics  . Smoking status: Never Smoker   . Smokeless tobacco: None  . Alcohol Use: No   No Known Allergies Family History  Problem Relation Age of Onset  . Hyperlipidemia Mother     Living, 40  . Hypertension Mother   . Hyperlipidemia Father     Living 24  . Hypertension Father   . Healthy Sister   . Healthy Brother     History of elevated ESR  Past medical history, social, surgical and family history all reviewed in electronic medical record.   Review of Systems: No headache, visual changes, nausea, vomiting, diarrhea, constipation, dizziness, abdominal pain, skin rash, fevers, chills, night sweats, weight loss, swollen lymph nodes, body aches, joint swelling, muscle aches, chest pain, shortness of breath, mood changes.   Objective Blood pressure 120/82, pulse 52, height _0  (1.778 m), weight 169 lb (76.658 kg).  General: No apparent distress alert and oriented x3 mood and affect normal, dressed appropriately.  HEENT: Pupils equal, extraocular movements intact  Respiratory: Patient's speak in full sentences and does not appear short  of breath  Cardiovascular: No lower extremity edema, non tender, no erythema  Skin: Warm dry intact with no signs of infection or rash on extremities or on axial skeleton.  Abdomen: Soft nontender  Neuro: Cranial nerves II through XII are intact, neurovascularly intact in all extremities with 2+ DTRs and 2+ pulses.  Lymph: No lymphadenopathy of posterior or anterior cervical chain or axillae bilaterally.  Gait normal with good balance and coordination.  MSK:  Non tender with full range of motion and good stability and symmetric strength and tone of  elbows, wrist, hip, knee and ankles bilaterally.  Significant dysfunction of the patient's scapula on the right side without winging still present  Back Exam:  Inspection: Mild decrease in lordosis Motion: Flexion 45 deg, Extension 25 deg, Side Bending to 35 deg bilaterally,  Rotation to 35 deg bilaterally  SLR laying: Negative  XSLR laying: Negative  Palpable tenderness: continued tenderness of the thoracic spine. Spinal musculature FABER: negative. Sensory change: Gross sensation intact to all lumbar and sacral dermatomes.  Reflexes: 2+ at both patellar tendons, 2+ at achilles tendons, Babinski's downgoing.  Strength at foot  Plantar-flexion: 5/5 Dorsi-flexion: 5/5 Eversion: 5/5 Inversion: 5/5  Leg strength  Quad: 5/5 Hamstring: 5/5 Hip flexor: 5/5 Hip abductors: 4/5  Gait unremarkable.      Impression and Recommendations:     This case required medical decision making of moderate complexity.

## 2014-09-28 NOTE — Progress Notes (Signed)
Pre visit review using our clinic review tool, if applicable. No additional management support is needed unless otherwise documented below in the visit note. 

## 2014-10-04 ENCOUNTER — Ambulatory Visit
Admission: RE | Admit: 2014-10-04 | Discharge: 2014-10-04 | Disposition: A | Discharge status: Home / Self Care | Payer: Medicare Other | Source: Ambulatory Visit | Attending: Family Medicine | Admitting: Family Medicine

## 2014-10-04 DIAGNOSIS — M546 Pain in thoracic spine: Secondary | ICD-10-CM | POA: Diagnosis not present

## 2014-10-04 DIAGNOSIS — M5010 Cervical disc disorder with radiculopathy, unspecified cervical region: Secondary | ICD-10-CM

## 2014-10-04 MED ORDER — IOHEXOL 300 MG/ML  SOLN
1.0000 mL | Freq: Once | INTRAMUSCULAR | Status: DC | PRN
  Administered 2014-10-04: 1 mL via EPIDURAL

## 2014-10-04 MED ORDER — TRIAMCINOLONE ACETONIDE 40 MG/ML IJ SUSP (RADIOLOGY)
60.0000 mg | Freq: Once | INTRAMUSCULAR | Status: AC
  Administered 2014-10-04: 60 mg via EPIDURAL

## 2014-10-12 ENCOUNTER — Ambulatory Visit (INDEPENDENT_AMBULATORY_CARE_PROVIDER_SITE_OTHER): Payer: Medicare Other | Admitting: Family Medicine

## 2014-10-12 ENCOUNTER — Encounter: Payer: Self-pay | Admitting: Family Medicine

## 2014-10-12 VITALS — BP 128/80 | HR 66 | Ht 70.0 in | Wt 165.0 lb

## 2014-10-12 DIAGNOSIS — I25119 Atherosclerotic heart disease of native coronary artery with unspecified angina pectoris: Secondary | ICD-10-CM

## 2014-10-12 DIAGNOSIS — M546 Pain in thoracic spine: Secondary | ICD-10-CM | POA: Diagnosis not present

## 2014-10-12 DIAGNOSIS — G894 Chronic pain syndrome: Secondary | ICD-10-CM

## 2014-10-12 MED ORDER — VENLAFAXINE HCL ER 37.5 MG PO CP24: 37.5000 mg | ORAL_CAPSULE | Freq: Every day | ORAL | Status: DC

## 2014-10-12 NOTE — Assessment & Plan Note (Signed)
Patient has failed all other conservative therapy including formal physical therapy, osteopathic manipulation, as well as a variety of medications and epidural steroid injections for protruding disks in the thoracic spine. It appears the patient may have more of an autoimmune disease and we will get labs to rule out ultrasound and if so than we are treating more for chronic pain syndrome. Patient was started on the low dose of the Effexor. Warned of potential side effects. We will see if patient makes any significant change and we will keep this on a very low dose second her to patient's cardiovascular history. Patient will come back and see me again in 3 weeks for further evaluation and treatment.  Spent  25 minutes with patient face-to-face and had greater than 50% of counseling including as described above in assessment and plan.

## 2014-10-12 NOTE — Patient Instructions (Addendum)
Good to see you Ice still can help effexor 37.5 mg daily We will get labs and I will release to you See me again in 3 weeks.

## 2014-10-12 NOTE — Progress Notes (Signed)
[name] [address], [place] [address] Phone: [phone] Subjective:     CC: Back pain follow up  RKY:[id number] Andrew Joyce is a 54 y.o. male coming in with complaint of back pain. Patient is being seen by neurology for multiple different things and is had chronic irritated back pain for quite some time.  Patient was continued to have thoracic back pain. Had MRI showing small central disc protrusions of T3 and 4 as well as T7 and T8. Patient was sent to have an epidural steroid injection. Patient did not respond to the T3-T4 and did have one at the T7-T8 which also did not give him any benefit. Patient states that he is becoming more frustrated. Had side effects to the gabapentin and discontinue this himself. Patient is concerned because nothing seems to be helping.    Past Medical History  Diagnosis Date  . Coronary artery disease     a. s/p CABG in 2008; b.  s/p cath in 2011 for med rx.;  c.  NSTEMI (02/2011):  LHC (02/2011):  LAD occluded, distal LAD filled via left to left collaterals, distal CFX 40-50%, proximal RCA occluded (right to right collaterals), distal RCA occluded, S-RCA occluded proximally, S-OM1/OM2 occluded (new from 2011 - culprit), L-LAD ok, ant apical HK, EF 45-50% - med Rx.  [name] Dyslipidemia   . Hypertension   . Recurrent UTI   . History of renal stone   . Bladder exstrophy      Congenital- had multiple surgeries as a child  . Chronic kidney disease (CKD), stage II (mild)   . Pancreatitis   . Hx of echocardiogram     Echo (05/2013):  EF 55-60%, inf HK, mild AI, normal RVSF, RVSP 28 mmHg  . Hx of cardiovascular stress test     Nuclear (06/2009):  EF 71%, small area of infarct/ischemia in inferior wall-unchanged from prior   Past Surgical History  Procedure Laterality Date  . Cardiac catheterization  07/28/2009    EF 60%; Grafts patent except for SVG to the AM and PD. Native RCA is occluded.  . Cardiac  catheterization  03/20/2006    EF 60-65%  . Coronary artery bypass graft  03/2006    LIMA GRAFT TO LAD, SAPHENOUS VEIN GRAFT TO THE ACUTE MARGINAL BRANCH THE RIGHT CORONARY, SAPHENOUS VEIN GRAFT TO THE PDA, SEQUENTIAL VEIN GRAFT TO THE FIRST AND SECOND OBTUSE MARGINAL VESSELS  . Cardiovascular stress test  06/12/2009    EF 71%, SMALL AREA OF INFARCT/ISCHEMIA IN INFERIOR WALL; FELT TO NOT BE CHANGED.  [name] Left heart catheterization with coronary angiogram N/A 02/26/2011    Procedure: LEFT HEART CATHETERIZATION WITH CORONARY ANGIOGRAM;  Surgeon: Thayer Headings, MD;  Location: [facility] CATH LAB;  Service: Cardiovascular;  Laterality: N/A;   Social History  Substance Use Topics  . Smoking status: Never Smoker   . Smokeless tobacco: None  . Alcohol Use: No   No Known Allergies Family History  Problem Relation Age of Onset  . Hyperlipidemia Mother     Living, 2  . Hypertension Mother   . Hyperlipidemia Father     Living 45  . Hypertension Father   . Healthy Sister   . Healthy Brother     History of elevated ESR      Past medical history, social, surgical and family history all reviewed in electronic medical record.   Review of Systems: No headache, visual changes, nausea, vomiting, diarrhea, constipation, dizziness, abdominal pain,  skin rash, fevers, chills, night sweats, weight loss, swollen lymph nodes, body aches, joint swelling, muscle aches, chest pain, shortness of breath, mood changes.   Objective Blood pressure 128/80, pulse 66, height _0  (1.778 m), weight 165 lb (74.844 kg), SpO2 98 %.  General: No apparent distress alert and oriented x3 mood and affect normal, dressed appropriately.  HEENT: Pupils equal, extraocular movements intact  Respiratory: Patient's speak in full sentences and does not appear short of breath  Cardiovascular: No lower extremity edema, non tender, no erythema  Skin: Warm dry intact with no signs of infection or rash on extremities or on axial skeleton.    Abdomen: Soft nontender  Neuro: Cranial nerves II through XII are intact, neurovascularly intact in all extremities with 2+ DTRs and 2+ pulses.  Lymph: No lymphadenopathy of posterior or anterior cervical chain or axillae bilaterally.  Gait normal with good balance and coordination.  MSK:  Non tender with full range of motion and good stability and symmetric strength and tone of  elbows, wrist, hip, knee and ankles bilaterally.  Continued scapular dysfunction noted.  Back Exam:  Inspection: Mild decrease in lordosis Motion: Flexion 45 deg, Extension 25 deg, Side Bending to 35 deg bilaterally,  Rotation to 35 deg bilaterally  SLR laying: Negative  XSLR laying: Negative  Palpable tenderness: continued tenderness of the thoracic paraspinal musculature no significant change from previous exam FABER: negative. Sensory change: Gross sensation intact to all lumbar and sacral dermatomes.  Reflexes: 2+ at both patellar tendons, 2+ at achilles tendons, Babinski's downgoing.  Strength at foot  Plantar-flexion: 5/5 Dorsi-flexion: 5/5 Eversion: 5/5 Inversion: 5/5  Leg strength  Quad: 5/5 Hamstring: 5/5 Hip flexor: 5/5 Hip abductors: 4/5  Gait unremarkable.     Impression and Recommendations:     This case required medical decision making of moderate complexity.

## 2014-10-12 NOTE — Progress Notes (Signed)
Pre visit review using our clinic review tool, if applicable. No additional management support is needed unless otherwise documented below in the visit note. 

## 2014-10-13 DIAGNOSIS — B9620 Unspecified Escherichia coli [E. coli] as the cause of diseases classified elsewhere: Secondary | ICD-10-CM | POA: Diagnosis not present

## 2014-10-13 DIAGNOSIS — N390 Urinary tract infection, site not specified: Secondary | ICD-10-CM | POA: Diagnosis not present

## 2014-10-14 ENCOUNTER — Other Ambulatory Visit (INDEPENDENT_AMBULATORY_CARE_PROVIDER_SITE_OTHER): Payer: Medicare Other

## 2014-10-14 DIAGNOSIS — G894 Chronic pain syndrome: Secondary | ICD-10-CM

## 2014-10-14 DIAGNOSIS — M546 Pain in thoracic spine: Secondary | ICD-10-CM | POA: Diagnosis not present

## 2014-10-14 DIAGNOSIS — M2550 Pain in unspecified joint: Secondary | ICD-10-CM | POA: Diagnosis not present

## 2014-10-14 LAB — C-REACTIVE PROTEIN: CRP: 0.1 mg/dL — AB (ref 0.5–20.0)

## 2014-10-14 LAB — SEDIMENTATION RATE: Sed Rate: 22 mm/h (ref 0–22)

## 2014-10-15 LAB — CALCIUM, IONIZED: CALCIUM ION: 1.32 mmol/L (ref 1.12–1.32)

## 2014-10-15 LAB — RHEUMATOID FACTOR

## 2014-10-17 LAB — PTH, INTACT AND CALCIUM
Calcium: 9.2 mg/dL (ref 8.4–10.5)
PTH: 32 pg/mL (ref 14–64)

## 2014-10-18 LAB — ANA: ANA: NEGATIVE

## 2014-10-18 LAB — ANTI-DNA ANTIBODY, DOUBLE-STRANDED: DS DNA AB: 1 [IU]/mL

## 2014-10-18 LAB — CYCLIC CITRUL PEPTIDE ANTIBODY, IGG: Cyclic Citrullin Peptide Ab: <16 Units

## 2014-10-19 LAB — C-ANCA TITER

## 2014-10-19 LAB — ANCA SCREEN W REFLEX TITER: ANCA SCREEN: POSITIVE — AB

## 2014-10-19 NOTE — Addendum Note (Signed)
Addended by: Douglass Rivers T on: 10/19/2014 09:38 AM   Modules accepted: Orders

## 2014-11-02 ENCOUNTER — Encounter: Payer: Self-pay | Admitting: Family Medicine

## 2014-11-02 ENCOUNTER — Ambulatory Visit (INDEPENDENT_AMBULATORY_CARE_PROVIDER_SITE_OTHER): Payer: Medicare Other | Admitting: Family Medicine

## 2014-11-02 VITALS — BP 120/82 | HR 59 | Ht 70.0 in | Wt 170.0 lb

## 2014-11-02 DIAGNOSIS — I25119 Atherosclerotic heart disease of native coronary artery with unspecified angina pectoris: Secondary | ICD-10-CM | POA: Diagnosis not present

## 2014-11-02 DIAGNOSIS — R768 Other specified abnormal immunological findings in serum: Secondary | ICD-10-CM | POA: Diagnosis not present

## 2014-11-02 MED ORDER — TRAMADOL HCL 50 MG PO TABS: 50.0000 mg | ORAL_TABLET | Freq: Three times a day (TID) | ORAL | Status: DC | PRN

## 2014-11-02 NOTE — Progress Notes (Signed)
Pre visit review using our clinic review tool, if applicable. No additional management support is needed unless otherwise documented below in the visit note. 

## 2014-11-02 NOTE — Progress Notes (Signed)
[name] [address] [place], [place] [address] Phone: [phone] Subjective:     CC: Back pain follow up  [id number] Andrew Joyce is a 54 y.o. male coming in with complaint of back pain. Patient is being seen by neurology for multiple different things and is had chronic irritated back pain for quite some time.  Patient was continued to have thoracic back pain. Had MRI showing small central disc protrusions of T3 and 4 as well as T7 and T8. Patient was sent to have an epidural steroid injection. Patient did not respond to the T3-T4 and did have one at the T7-T8 which also did not give him any benefit. Patient then was sent for autoimmune lands and did have a positive ankle. Patient has been referred to rheumatology but still has to wait 2 weeks to be seen.  Patient was placed on Effexor. Patient states he has not made any significant improvement. Unable to do certain activities secondary to the fatigue that occurs to these muscles. Patient continues to try to workout and when he doesn't know pain seems to be the aftermath. All further workup has not point to anything specific other than the potential for autoimmune with patient's coronary artery disease.[name]    Past Medical History  Diagnosis Date  . Coronary artery disease     a. s/p CABG in 2008; b.  s/p cath in 2011 for med rx.;  c.  NSTEMI (02/2011):  LHC (02/2011):  LAD occluded, distal LAD filled via left to left collaterals, distal CFX 40-50%, proximal RCA occluded (right to right collaterals), distal RCA occluded, S-RCA occluded proximally, S-OM1/OM2 occluded (new from 2011 - culprit), L-LAD ok, ant apical HK, EF 45-50% - med Rx.  [name] Dyslipidemia   . Hypertension   . Recurrent UTI   . History of renal stone   . Bladder exstrophy      Congenital- had multiple surgeries as a child  . Chronic kidney disease (CKD), stage II (mild)   . Pancreatitis   . Hx of echocardiogram     Echo (05/2013):   EF 55-60%, inf HK, mild AI, normal RVSF, RVSP 28 mmHg  . Hx of cardiovascular stress test     Nuclear (06/2009):  EF 71%, small area of infarct/ischemia in inferior wall-unchanged from prior   Past Surgical History  Procedure Laterality Date  . Cardiac catheterization  07/28/2009    EF 60%; Grafts patent except for SVG to the AM and PD. Native RCA is occluded.  . Cardiac catheterization  03/20/2006    EF 60-65%  . Coronary artery bypass graft  03/2006    LIMA GRAFT TO LAD, SAPHENOUS VEIN GRAFT TO THE ACUTE MARGINAL BRANCH THE RIGHT CORONARY, SAPHENOUS VEIN GRAFT TO THE PDA, SEQUENTIAL VEIN GRAFT TO THE FIRST AND SECOND OBTUSE MARGINAL VESSELS  . Cardiovascular stress test  06/12/2009    EF 71%, SMALL AREA OF INFARCT/ISCHEMIA IN INFERIOR WALL; FELT TO NOT BE CHANGED.  [name] Left heart catheterization with coronary angiogram N/A 02/26/2011    Procedure: LEFT HEART CATHETERIZATION WITH CORONARY ANGIOGRAM;  Surgeon: Thayer Headings, MD;  Location: [facility] CATH LAB;  Service: Cardiovascular;  Laterality: N/A;   Social History  Substance Use Topics  . Smoking status: Never Smoker   . Smokeless tobacco: None  . Alcohol Use: No   No Known Allergies Family History  Problem Relation Age of Onset  . Hyperlipidemia Mother     Living, 40  . Hypertension Mother   .  Hyperlipidemia Father     Living 57  . Hypertension Father   . Healthy Sister   . Healthy Brother     History of elevated ESR and positive C-ANCA      Past medical history, social, surgical and family history all reviewed in electronic medical record.   Review of Systems: No headache, visual changes, nausea, vomiting, diarrhea, constipation, dizziness, abdominal pain, skin rash, fevers, chills, night sweats, weight loss, swollen lymph nodes, body aches, joint swelling, muscle aches, chest pain, shortness of breath, mood changes.   Objective Blood pressure 120/82, pulse 59, height 5' 10"  (1.778 m), weight 170 lb (77.111 kg), SpO2 96 %.    General: No apparent distress alert and oriented x3 mood and affect normal, dressed appropriately.  HEENT: Pupils equal, extraocular movements intact  Respiratory: Patient's speak in full sentences and does not appear short of breath  Cardiovascular: No lower extremity edema, non tender, no erythema  Skin: Warm dry intact with no signs of infection or rash on extremities or on axial skeleton.  Abdomen: Soft nontender  Neuro: Cranial nerves II through XII are intact, neurovascularly intact in all extremities with 2+ DTRs and 2+ pulses.  Lymph: No lymphadenopathy of posterior or anterior cervical chain or axillae bilaterally.  Gait normal with good balance and coordination.  MSK:  Non tender with full range of motion and good stability and symmetric strength and tone of  elbows, wrist, hip, knee and ankles bilaterally.  Continued scapular dysfunction noted.  Back Exam:  Inspection: Mild decrease in lordosis Motion: Flexion 45 deg, Extension 25 deg, Side Bending to 35 deg bilaterally,  Rotation to 35 deg bilaterally  SLR laying: Negative  XSLR laying: Negative  Palpable tenderness: continued tenderness of the thoracic paraspinal musculature no significant change from previous exam FABER: negative. Sensory change: Gross sensation intact to all lumbar and sacral dermatomes.  Reflexes: 2+ at both patellar tendons, 2+ at achilles tendons, Babinski's downgoing.  Strength at foot  Plantar-flexion: 5/5 Dorsi-flexion: 5/5 Eversion: 5/5 Inversion: 5/5  Leg strength  Quad: 5/5 Hamstring: 5/5 Hip flexor: 5/5 Hip abductors: 4/5  Gait unremarkable.     Impression and Recommendations:     This case required medical decision making of moderate complexity.

## 2014-11-02 NOTE — Patient Instructions (Addendum)
Good to see you I am interested in what rheumatology has to say.  Increase tramadol to 3 times daily with 1 tylenol Keep being active I am here if you have questions.

## 2014-11-02 NOTE — Assessment & Plan Note (Signed)
wegners granulomatis, vasculitis, etc are within the differential. We discussed that certain things such as high-dose prednisone or possibly other medications may be necessary. We discussed possibly increasing patient's tramadol and perception was given to help with some pain relief and would more on a scheduled basis. We discussed with patient that otherwise we've exhausted all options from a sports medicine community for possible treatment and care at this time. More than willing to see patient to see if there is anything else that can be done. We do not have any specific findings. I do think that unlikely and ongoing disease is likely contributing to this discomfort and possibly rheumatology will have more insight.  Spent  25 minutes with patient face-to-face and had greater than 50% of counseling including as described above in assessment and plan.

## 2014-11-03 ENCOUNTER — Other Ambulatory Visit: Payer: Self-pay | Admitting: Cardiology

## 2014-11-14 DIAGNOSIS — Z951 Presence of aortocoronary bypass graft: Secondary | ICD-10-CM | POA: Diagnosis not present

## 2014-11-14 DIAGNOSIS — M5135 Other intervertebral disc degeneration, thoracolumbar region: Secondary | ICD-10-CM | POA: Diagnosis not present

## 2014-11-14 DIAGNOSIS — M25511 Pain in right shoulder: Secondary | ICD-10-CM | POA: Diagnosis not present

## 2014-11-18 ENCOUNTER — Encounter: Payer: Self-pay | Admitting: Neurology

## 2014-11-18 ENCOUNTER — Ambulatory Visit (INDEPENDENT_AMBULATORY_CARE_PROVIDER_SITE_OTHER): Payer: Medicare Other | Admitting: Neurology

## 2014-11-18 VITALS — BP 128/78 | HR 60 | Ht 70.0 in | Wt 172.2 lb

## 2014-11-18 DIAGNOSIS — I25119 Atherosclerotic heart disease of native coronary artery with unspecified angina pectoris: Secondary | ICD-10-CM

## 2014-11-18 DIAGNOSIS — G709 Myoneural disorder, unspecified: Secondary | ICD-10-CM

## 2014-11-18 DIAGNOSIS — R253 Fasciculation: Secondary | ICD-10-CM | POA: Diagnosis not present

## 2014-11-18 DIAGNOSIS — G1229 Other motor neuron disease: Secondary | ICD-10-CM

## 2014-11-18 NOTE — Patient Instructions (Addendum)
1.  Continue medications as you are taking them, except try taking your 2nd dose sooner around 3pm 2.  Return to clinic in 73-months

## 2014-11-18 NOTE — Progress Notes (Signed)
Follow-up Visit   Date: 11/18/2014    Andrew Joyce MRN: [id number] DOB: 1960/06/10   Interim History: Andrew Joyce is a 54 y.o. left-handed Caucasian male with congenital exstrophy status post multiple surgeries complicated by chronic UTI, CAD s/p CABG (2008), hyperlipidemia, hypertension, and stage II CKD returning to the clinic for follow-up of muscle cramps and leg weakness.   History of present illness: Starting in October 2015, he developed "firing nerves" and cramping of the left legs. Symptoms are worse at night and keep him from sleeping because of painful cramps. They usually last several minutes. He has no associated weakness or similar symptoms on the right leg or arms. He went to see his PCP who recommended melatonin, muscle relaxants (robaxin), ambien, and heat packs. He also tried taking valium at night which did not help much.   Follow-up 12/15/2013: He feels that the agitation of the leg and cramps has improved since starting baclofen and he is able to sleep better through the night.  There has been no change in his leg weakness or twitches.  He has noticed intermittent cramping of the left hand, especially when driving.    UPDATE 01/25/2014:  He reports having cramps about 3-4 times of the night.  He tried his family member's tramadol which completely alleviated his cramps.  He has not noticed left leg weakness to be a problem, but his muscle twitches remain unchanged.   UPDATE 05/03/2014:   New twitches in his right lower leg, but has not noticed any weakness. He reports having new right mid-back pain, described as severe and excruciating ache especially with activity, such as washing dishes, folding clothes, playing the piano, etc.  He has severe achy and soreness of his back.    UPDATE 07/01/2014:  He was discharged from PT early because there was no improvement in his back pain with exercises recommended. He continues to have localized midback pain  which is worsening because it takes less activity for it to trigger symptoms and intensity is much worse.  It is improved when he lays down and rests, but within several minutes of doing any activies with his hands (cooking, cleaning, folding laundry, etc) the achy pain is aggravated.  MRI thoracic spine did not show any nerve impingement.     UPDATE 08/18/2014:   Muscle cramps are stable, no worsening.  His back pain is also no worse than before and he has noticed some benefith with doing the exercises as recommended by Dr. Tamala Julian.  Because his back pain remains persistent, he is planning on having steroid injection to the region to see if this helps.  He has no new complaints.  Specifically, no new weakness.  He has noticed a few muscle twitches in his left triceps, but this is rare.    UPDATE 11/18/2014:  Overall, symptoms are stable.  He continues to have muscle cramps and twitches of the legs and right scapula pain which has not worsened.  No new weakness.   He has been working on strengthening his left leg and is able to push and lift the same weight on each leg now.   Medications:  Current Outpatient Prescriptions on File Prior to Visit  Medication Sig Dispense Refill  . amLODipine (NORVASC) 5 MG tablet Take 1 tablet (5 mg total) by mouth daily. 30 tablet 6  . aspirin 81 MG tablet Take 1 tablet (81 mg total) by mouth daily.    . baclofen (LIORESAL) 10 MG tablet Take  10 mg by mouth 3 (three) times daily. Take 2 tablets in the morning, 1 at lunchtime and 3 at bedtime.    . fenofibrate 54 MG tablet Take 1 tablet (54 mg total) by mouth daily. 30 tablet 6  . fish oil-omega-3 fatty acids 1000 MG capsule Take 2 capsules (2 g total) by mouth 2 (two) times daily.    . isosorbide mononitrate (IMDUR) 60 MG 24 hr tablet TAKE ONE & ONE-HALF TABLETS BY MOUTH ONCE DAILY 45 tablet 1  . losartan (COZAAR) 100 MG tablet Take 1 tablet (100 mg total) by mouth daily. 30 tablet 11  . metoprolol (LOPRESSOR) 50 MG  tablet Take 1 tablet (50 mg total) by mouth 2 (two) times daily. 60 tablet 8  . nitroGLYCERIN (NITROSTAT) 0.4 MG SL tablet Place 1 tablet (0.4 mg total) under the tongue every 5 (five) minutes as needed. For chest pain 25 tablet 3  . pravastatin (PRAVACHOL) 40 MG tablet Take 1 tablet (40 mg total) by mouth daily. 30 tablet 6  . traMADol (ULTRAM) 50 MG tablet Take 1 tablet (50 mg total) by mouth 3 (three) times daily as needed for moderate pain. 90 tablet 1   No current facility-administered medications on file prior to visit.    Allergies: No Known Allergies  Review of Systems:  CONSTITUTIONAL: No fevers, chills, night sweats, or weight loss.  EYES: No visual changes or eye pain ENT: No hearing changes.  No history of nose bleeds.   RESPIRATORY: No cough, wheezing and shortness of breath.   CARDIOVASCULAR: Negative for chest pain, and palpitations.   GI: Negative for abdominal discomfort, blood in stools or black stools.  No recent change in bowel habits.   GU:  No history of incontinence.   MUSCLOSKELETAL: No history of joint pain or swelling.  +cramps.   SKIN: Negative for lesions, rash, and itching.   ENDOCRINE: Negative for cold or heat intolerance, polydipsia or goiter.   PSYCH:  No depression or anxiety symptoms.   NEURO: As Above.   Vital Signs:  BP 128/78 mmHg  Pulse 60  Ht 5' 10"  (1.778 m)  Wt 172 lb 4 oz (78.132 kg)  BMI 24.72 kg/m2  SpO2 98%  Neurological Exam: MENTAL STATUS including orientation to time, place, person, recent and remote memory, attention span and concentration, language, and fund of knowledge is normal.  Speech is not dysarthric.  CRANIAL NERVES:  Pupils equal round and reactive to light.  Face is symmetric.   MOTOR:  Moderate atrophy of the left lower leg (quads, medial gastroc). Prominent and active fasciculations of the left > right calf. Normal tone.   Right Upper Extremity:    Left Upper Extremity:    Deltoid  5/5   Deltoid  5/5   Biceps   5/5   Biceps  5/5   Triceps  5/5   Triceps  5/5   Wrist extensors  5/5   Wrist extensors  5/5   Wrist flexors  5/5   Wrist flexors  5/5   Finger extensors  5/5   Finger extensors  5/5   Finger flexors  5/5   Finger flexors  5/5   Dorsal interossei  5/5   Dorsal interossei  5/5   Abductor pollicis  5/5   Abductor pollicis  5/5   Tone (Ashworth scale)  0  Tone (Ashworth scale)  0   Right Lower Extremity:    Left Lower Extremity:    Hip flexors  5/5   Hip  flexors  5/5   Hip extensors  5/5   Hip extensors  5/5   Knee flexors  5/5   Knee flexors *improved 5/5   Knee extensors  5/5   Knee extensors *improved 5/5   Dorsiflexors  5/5   Dorsiflexors *improved 5/5   Plantarflexors  5/5   Plantarflexors  5/5   Toe extensors  5/5   Toe extensors  5/5   Toe flexors  5-/5   Toe flexors  5-/5   Tone (Ashworth scale)  0  Tone (Ashworth scale)  0   MSRs:  Right                                                                 Left brachioradialis 1+  brachioradialis 1+  biceps 1+  biceps 1+  triceps 1+  triceps 1+  patellar 2+  patellar 2+  ankle jerk 2+  ankle jerk 1+  Hoffman no  Hoffman no  plantar response down  plantar response down   COORDINATION/GAIT:   Gait narrow based and stable. Stressed gait intact.  Data: EMG 12/15/2013: 1. Chronic C6 radiculopathy affecting the left upper extremity. 2. Left ulnar neuropathy with slowing across the elbow, purely demyelinating in type. 3. Multilevel intraspinal canal lesion affecting L2-S1 myotomes bilaterally. These findings are moderate in degree electrically and worse on the left side involving the L2-L4 myotome. Active changes are also seen affecting the medial gastrocnemius muscle. These findings are insufficient for the diagnosis of motor neuron disease, recommend repeat electrodiagnostic testing in 6 months if clinically indicated.  Labs 07/14/2014:  CK 252*, vitamin D 22* Labs 11/25/2013:  CK 215, aldolase 9.2*, PTH 34, SPEP/UPEP with IFE no Mo  protein, copper 116, zinc 67, CRP 0.6*, ESR 47  CT lumbar spine 12/17/2013: 1. Severe degenerative disc disease with disc height loss at L5-S1 with a mild broad-based disc osteophyte complex and right foraminal narrowing. 2. Mild broad-based disc bulge at L4-5 without foraminal or central canal stenosis.  MRI thoracic spine wo contrast 05/29/2014: Small central disc protrusions T3-4 and T7-8 without neural impingement or significant spinal stenosis. No acute abnormality.  MRI lumbar spine wo contrast 05/29/2014:  Mild to moderate spinal stenosis at L4-5 with small central disc protrusion and diffuse disc bulging and spondylosis.  Postop laminectomy left L5-S1 without recurrent disc protrusion. There is spondylosis causing subarticular and foraminal stenosis bilaterally at L5-S1.   IMPRESSION/PLAN: Andrew Joyce is a 54 year-old gentleman returning for evaluation of left leg fasciculations, weakness, and painful cramps.  His exam shows intact strength of the legs, which is a marked improvement from previously.  As noted before, I did not observe any fasciculations in the upper extremities, although they remain active in the L >> R gastrocnemius.  Currently, he primary ongoing complaints are muscle cramps, right scapula pain (seeing Dr. Tamala Julian), and painless fasciculations.  His EMG showed chronic motor axon loss changes affecting L2-S1 myotomes bilaterally, with active changes isolated to the medial gastrocnemius muscle with mild CK elevation.  These two features with his exam initially was concerned for early onset lower motor neuron disorder, possibly progressive muscular atrophy), but the fact that his weakness has improved argues against this.  Differential includes:  Cramp-fasciculation syndrome vs progressive muscular atrophy  At this  juncture, I will continue to follow him clinically and treat symptomatically for cramps with baclofen 54m TID (morning, 3pm, and bedtime) and tramadol 56mBID prn.    Regarding his right back pain, he is has been seeing Dr. Sm[facility]amala JulianSports Medicine) for scapular dysfunction.  Return to clinic in 6 months, or sooner as needed  The duration of this appointment visit was 30 minutes of face-to-face time with the patient.  Greater than 50% of this time was spent in counseling, explanation of diagnosis, planning of further management, and coordination of care.   Thank you for allowing me to participate in patient's care.  If I can answer any additional questions, I would be pleased to do so.    Sincerely,    Donika K. PaPosey ProntoDO

## 2014-11-24 ENCOUNTER — Other Ambulatory Visit: Payer: Self-pay | Admitting: Neurology

## 2014-11-24 NOTE — Telephone Encounter (Signed)
Med Refill

## 2014-11-30 ENCOUNTER — Ambulatory Visit (INDEPENDENT_AMBULATORY_CARE_PROVIDER_SITE_OTHER): Payer: Medicare Other | Admitting: Family Medicine

## 2014-11-30 ENCOUNTER — Encounter: Payer: Self-pay | Admitting: Family Medicine

## 2014-11-30 VITALS — BP 120/78 | HR 55 | Wt 173.0 lb

## 2014-11-30 DIAGNOSIS — M546 Pain in thoracic spine: Secondary | ICD-10-CM | POA: Diagnosis not present

## 2014-11-30 DIAGNOSIS — I25119 Atherosclerotic heart disease of native coronary artery with unspecified angina pectoris: Secondary | ICD-10-CM

## 2014-11-30 DIAGNOSIS — G8929 Other chronic pain: Secondary | ICD-10-CM

## 2014-11-30 DIAGNOSIS — M549 Dorsalgia, unspecified: Secondary | ICD-10-CM

## 2014-11-30 NOTE — Progress Notes (Signed)
[name] [address] [place], [place] [address] Phone: [phone] Subjective:     CC: Back pain follow up  NOB:[id number] Andrew Joyce is a 54 y.o. male coming in with complaint of back pain. Patient is being seen by neurology for multiple different things and is had chronic irritated back pain for quite some time.  Patient was continued to have thoracic back pain. Had MRI showing small central disc protrusions of T3 and 4 as well as T7 and T8. Patient was sent to have an epidural steroid injection. Patient did not respond to the T3-T4 and did have one at the T7-T8 which also did not give him any benefit. 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 [name] Hx of CAD. Patient saw rheumatology and they stated that they would not change any management at this time. Patient is prone to urinary tract infections and does not want any immune modulators. Continues to have the chronic back pain that stops him from even daily activities. Continues to be able to work out fairly regularly but unfortunately even now starting to give him some discomfort with this. No radiation down the low legs or arms. Continues to follow up with neurology for the fasciculations the patient has as well.  Attempted Effexor as well as gabapentin with no improvement and patient did have side effects. Continues to take tramadol on a regular basis.  Past Medical History  Diagnosis Date  . Coronary artery disease     a. s/p CABG in 2008; b.  s/p cath in 2011 for med rx.;  c.  NSTEMI (02/2011):  LHC (02/2011):  LAD occluded, distal LAD filled via left to left collaterals, distal CFX 40-50%, proximal RCA occluded (right to right collaterals), distal RCA occluded, S-RCA occluded proximally, S-OM1/OM2 occluded (new from 2011 - culprit), L-LAD ok, ant apical HK, EF 45-50% - med Rx.  [name] Dyslipidemia   . Hypertension   . Recurrent UTI   . History of renal stone   .  Bladder exstrophy      Congenital- had multiple surgeries as a child  . Chronic kidney disease (CKD), stage II (mild)   . Pancreatitis   . Hx of echocardiogram     Echo (05/2013):  EF 55-60%, inf HK, mild AI, normal RVSF, RVSP 28 mmHg  . Hx of cardiovascular stress test     Nuclear (06/2009):  EF 71%, small area of infarct/ischemia in inferior wall-unchanged from prior   Past Surgical History  Procedure Laterality Date  . Cardiac catheterization  07/28/2009    EF 60%; Grafts patent except for SVG to the AM and PD. Native RCA is occluded.  . Cardiac catheterization  03/20/2006    EF 60-65%  . Coronary artery bypass graft  03/2006    LIMA GRAFT TO LAD, SAPHENOUS VEIN GRAFT TO THE ACUTE MARGINAL BRANCH THE RIGHT CORONARY, SAPHENOUS VEIN GRAFT TO THE PDA, SEQUENTIAL VEIN GRAFT TO THE FIRST AND SECOND OBTUSE MARGINAL VESSELS  . Cardiovascular stress test  06/12/2009    EF 71%, SMALL AREA OF INFARCT/ISCHEMIA IN INFERIOR WALL; FELT TO NOT BE CHANGED.  [name] Left heart catheterization with coronary angiogram N/A 02/26/2011    Procedure: LEFT HEART CATHETERIZATION WITH CORONARY ANGIOGRAM;  Surgeon: Thayer Headings, MD;  Location: [facility] CATH LAB;  Service: Cardiovascular;  Laterality: N/A;   Social History  Substance Use Topics  . Smoking status: Never Smoker   . Smokeless tobacco: None  .  Alcohol Use: No   No Known Allergies Family History  Problem Relation Age of Onset  . Hyperlipidemia Mother     Living, 1  . Hypertension Mother   . Hyperlipidemia Father     Living 73  . Hypertension Father   . Healthy Sister   . Healthy Brother     History of elevated ESR and positive C-ANCA      Past medical history, social, surgical and family history all reviewed in electronic medical record.   Review of Systems: No headache, visual changes, nausea, vomiting, diarrhea, constipation, dizziness, abdominal pain, skin rash, fevers, chills, night sweats, weight loss, swollen lymph nodes, body aches, joint  swelling, muscle aches, chest pain, shortness of breath, mood changes.   Objective Blood pressure 120/78, pulse 55, weight 173 lb (78.472 kg), SpO2 98 %.  General: No apparent distress alert and oriented x3 mood and affect normal, dressed appropriately.  HEENT: Pupils equal, extraocular movements intact  Respiratory: Patient's speak in full sentences and does not appear short of breath  Cardiovascular: No lower extremity edema, non tender, no erythema  Skin: Warm dry intact with no signs of infection or rash on extremities or on axial skeleton.  Abdomen: Soft nontender  Neuro: Cranial nerves II through XII are intact, neurovascularly intact in all extremities with 2+ DTRs and 2+ pulses.  Lymph: No lymphadenopathy of posterior or anterior cervical chain or axillae bilaterally.  Gait normal with good balance and coordination.  MSK:  Non tender with full range of motion and good stability and symmetric strength and tone of  elbows, wrist, hip, knee and ankles bilaterally.  Continued scapular dysfunction still noted.  Back Exam:  Inspection: Mild decrease in lordosis Motion: Flexion 45 deg, Extension 25 deg, Side Bending to 35 deg bilaterally,  Rotation to 35 deg bilaterally  SLR laying: Negative  XSLR laying: Negative  Palpable tenderness: continued tenderness of the thoracic paraspinal musculature no significant change from previous exam FABER: negative. Sensory change: Gross sensation intact to all lumbar and sacral dermatomes.  Reflexes: 2+ at both patellar tendons, 2+ at achilles tendons, Babinski's downgoing.  Strength at foot  Plantar-flexion: 5/5 Dorsi-flexion: 5/5 Eversion: 5/5 Inversion: 5/5  Leg strength  Quad: 5/5 Hamstring: 5/5 Hip flexor: 5/5 Hip abductors: 4/5  Gait unremarkable.     Impression and Recommendations:     This case required medical decision making of moderate complexity.

## 2014-11-30 NOTE — Patient Instructions (Signed)
Good to see you I am sorry I do not have the best answer.  Hopefully Dr. Maryjean Ka may have an idea.  Happy holidays! Send me any questions you have if you need me.

## 2014-11-30 NOTE — Assessment & Plan Note (Signed)
Patient is failed many different treatment options at this time. Patient did not respond well to osteopathic manipulation, epidural steroid injections to different levels of the thoracic spine, and does have a positive P-ANCA and history of coronary artery disease and exist somewhat concern for vasculitis. Patient though secondary to his history of recurrent urinary tract infections does not want any immune modulator. Patient has not responded to many different medications for this back pain and continues to have it even with minimal effort with daily activities. I would like to refer patient to pain management for the possibility of other intervention such as more specific injections or treatment options a could be helpful. Patient can come back if he has questions. We discussed the possibility of referral to another rheumatologist but he would like to wait till a future date is a does not think he would change management.

## 2014-12-12 ENCOUNTER — Other Ambulatory Visit: Payer: Self-pay | Admitting: Cardiology

## 2014-12-12 NOTE — Telephone Encounter (Signed)
Rx has been sent to the pharmacy electronically. ° °

## 2014-12-27 ENCOUNTER — Other Ambulatory Visit: Payer: Self-pay | Admitting: Neurology

## 2014-12-27 NOTE — Telephone Encounter (Signed)
Rx called in #[phone] with no refills.

## 2015-01-11 ENCOUNTER — Other Ambulatory Visit: Payer: Self-pay | Admitting: Cardiology

## 2015-01-11 NOTE — Telephone Encounter (Signed)
Rx request sent to pharmacy.  

## 2015-01-17 DIAGNOSIS — M546 Pain in thoracic spine: Secondary | ICD-10-CM | POA: Diagnosis not present

## 2015-01-17 DIAGNOSIS — M47814 Spondylosis without myelopathy or radiculopathy, thoracic region: Secondary | ICD-10-CM | POA: Diagnosis not present

## 2015-01-17 DIAGNOSIS — G894 Chronic pain syndrome: Secondary | ICD-10-CM | POA: Diagnosis not present

## 2015-01-17 DIAGNOSIS — M60811 Other myositis, right shoulder: Secondary | ICD-10-CM | POA: Diagnosis not present

## 2015-01-30 DIAGNOSIS — N3020 Other chronic cystitis without hematuria: Secondary | ICD-10-CM | POA: Diagnosis not present

## 2015-01-30 DIAGNOSIS — Z0000 Encounter for general adult medical examination without abnormal findings: Secondary | ICD-10-CM | POA: Diagnosis not present

## 2015-01-30 DIAGNOSIS — N200 Calculus of kidney: Secondary | ICD-10-CM | POA: Diagnosis not present

## 2015-01-31 ENCOUNTER — Other Ambulatory Visit: Payer: Self-pay | Admitting: Cardiology

## 2015-01-31 NOTE — Telephone Encounter (Signed)
Rx(s) sent to pharmacy electronically.  

## 2015-02-02 ENCOUNTER — Encounter: Payer: Self-pay | Admitting: Family

## 2015-02-02 ENCOUNTER — Ambulatory Visit (INDEPENDENT_AMBULATORY_CARE_PROVIDER_SITE_OTHER): Payer: Medicare Other | Admitting: Family

## 2015-02-02 VITALS — BP 120/72 | HR 57 | Temp 98.3°F | Resp 16 | Ht 70.0 in | Wt 171.0 lb

## 2015-02-02 DIAGNOSIS — H6122 Impacted cerumen, left ear: Secondary | ICD-10-CM | POA: Diagnosis not present

## 2015-02-02 NOTE — Assessment & Plan Note (Signed)
Impacted cerumen of left ear. Left ear was cleaned out with warm water mixed with Docusate Sodium. There was wax return from the ear and ear drum was visible. Patient tolerated this well. Instructions for ear cleaning and prevention were provided.

## 2015-02-02 NOTE — Progress Notes (Signed)
Pre visit review using our clinic review tool, if applicable. No additional management support is needed unless otherwise documented below in the visit note. 

## 2015-02-02 NOTE — Progress Notes (Signed)
Subjective:    Patient ID: Andrew Joyce, male    DOB: 1960/01/15, 55 y.o.   MRN: [id number]  Chief Complaint  Patient presents with  . Ear Fullness    has not been able to hear out of his left ear good, has tried OTC ear wax removal medication that has not helped    HPI:  Andrew Joyce is a 55 y.o. male who  has a past medical history of Coronary artery disease; Dyslipidemia; Hypertension; Recurrent UTI; History of renal stone; Bladder exstrophy; Chronic kidney disease (CKD), stage II (mild); Pancreatitis; echocardiogram; and cardiovascular stress test. and presents today for an acute office visit.    This is a new problem. Associated symptom of decreased hearing in his left ear has been going on for about 2 weeks. Modifying factors include OTC ear wax removal kits which have helped minimally. Denies pain, discharge, or fevers.  No Known Allergies   Current Outpatient Prescriptions on File Prior to Visit  Medication Sig Dispense Refill  . amLODipine (NORVASC) 5 MG tablet Take 1 tablet (5 mg total) by mouth daily. 30 tablet 6  . aspirin 81 MG tablet Take 1 tablet (81 mg total) by mouth daily.    . baclofen (LIORESAL) 10 MG tablet TAKE 2 TABLETS BY MOUTH IN THE MORNING, AND THEN 1 TAB AT LUNCHTIME, AND THEN 3 TABS AT BEDTIME 180 tablet 11  . cephALEXin (KEFLEX) 250 MG capsule Take 250 mg by mouth at bedtime.  0  . fish oil-omega-3 fatty acids 1000 MG capsule Take 2 capsules (2 g total) by mouth 2 (two) times daily.    . isosorbide mononitrate (IMDUR) 60 MG 24 hr tablet TAKE ONE & ONE-HALF TABLETS BY MOUTH ONCE DAILY 45 tablet 6  . losartan (COZAAR) 100 MG tablet Take 1 tablet (100 mg total) by mouth daily. 30 tablet 11  . metoprolol (LOPRESSOR) 50 MG tablet Take 1 tablet (50 mg total) by mouth 2 (two) times daily. 60 tablet 8  . nitroGLYCERIN (NITROSTAT) 0.4 MG SL tablet Place 1 tablet (0.4 mg total) under the tongue every 5 (five) minutes as needed. For chest pain 25  tablet 3  . pravastatin (PRAVACHOL) 40 MG tablet Take 1 tablet (40 mg total) by mouth daily. 30 tablet 2  . traMADol (ULTRAM) 50 MG tablet TAKE ONE TABLET BY MOUTH EVERY 12 HOURS AS NEEDED FOR  MODERATE  PAIN 60 tablet 0   No current facility-administered medications on file prior to visit.    Review of Systems  Constitutional: Negative for fever and chills.  HENT: Negative for congestion, ear discharge and ear pain.       Objective:    BP 120/72 mmHg  Pulse 57  Temp(Src) 98.3 F (36.8 C) (Oral)  Resp 16  Ht 5\' 10"  (1.778 m)  Wt 171 lb (77.565 kg)  BMI 24.54 kg/m2  SpO2 98% Nursing note and vital signs reviewed.  Physical Exam  Constitutional: He is oriented to person, place, and time. He appears well-developed and well-nourished. No distress.  HENT:  Right Ear: Hearing, tympanic membrane, external ear and ear canal normal.  Left Ear: External ear normal. No drainage, swelling or tenderness. Decreased hearing is noted.  Impacted cerumen noted left ear.   Cardiovascular: Normal rate, regular rhythm, normal heart sounds and intact distal pulses.   Pulmonary/Chest: Effort normal and breath sounds normal.  Neurological: He is alert and oriented to person, place, and time.  Skin: Skin is warm and dry.  Psychiatric: He has a normal mood and affect. His behavior is normal. Judgment and thought content normal.       Assessment & Plan:   Problem List Items Addressed This Visit      Nervous and Auditory   Impacted cerumen of left ear - Primary    Impacted cerumen of left ear. Left ear was cleaned out with warm water mixed with Docusate Sodium. There was wax return from the ear and ear drum was visible. Patient tolerated this well. Instructions for ear cleaning and prevention were provided.

## 2015-02-02 NOTE — Patient Instructions (Signed)
Thank you for choosing [facility].  Summary/Instructions:  If your symptoms worsen or fail to improve, please contact our office for further instruction, or in case of emergency go directly to the emergency room at the closest medical facility.   Cerumen Impaction The structures of the external ear canal secrete a waxy substance known as cerumen. Excess cerumen can build up in the ear canal, causing a condition known as cerumen impaction. Cerumen impaction can cause ear pain and disrupt the function of the ear. The rate of cerumen production differs for each individual. In certain individuals, the configuration of the ear canal may decrease his or her ability to naturally remove cerumen. CAUSES Cerumen impaction is caused by excessive cerumen production or buildup. RISK FACTORS  Frequent use of swabs to clean ears.  Having narrow ear canals.  Having eczema.  Being dehydrated. SIGNS AND SYMPTOMS  Diminished hearing.  Ear drainage.  Ear pain.  Ear itch. TREATMENT Treatment may involve:  Over-the-counter or prescription ear drops to soften the cerumen.  Removal of cerumen by a health care provider. This may be done with:  Irrigation with warm water. This is the most common method of removal.  Ear curettes and other instruments.  Surgery. This may be done in severe cases. HOME CARE INSTRUCTIONS  Take medicines only as directed by your health care provider.  Do not insert objects into the ear with the intent of cleaning the ear. PREVENTION  Do not insert objects into the ear, even with the intent of cleaning the ear. Removing cerumen as a part of normal hygiene is not necessary, and the use of swabs in the ear canal is not recommended.  Drink enough water to keep your urine clear or pale yellow.  Control your eczema if you have it. SEEK MEDICAL CARE IF:  You develop ear pain.  You develop bleeding from the ear.  The cerumen does not clear after you use  ear drops as directed.   This information is not intended to replace advice given to you by your health care provider. Make sure you discuss any questions you have with your health care provider.   Document Released: 02/01/2004 Document Revised: 01/14/2014 Document Reviewed: 08/10/2014 Elsevier Interactive Patient Education [employer].

## 2015-02-08 ENCOUNTER — Ambulatory Visit (INDEPENDENT_AMBULATORY_CARE_PROVIDER_SITE_OTHER): Payer: Medicare Other | Admitting: Family Medicine

## 2015-02-08 ENCOUNTER — Encounter: Payer: Self-pay | Admitting: Family Medicine

## 2015-02-08 VITALS — BP 132/84 | HR 64 | Ht 70.0 in | Wt 170.0 lb

## 2015-02-08 DIAGNOSIS — R768 Other specified abnormal immunological findings in serum: Secondary | ICD-10-CM

## 2015-02-08 MED ORDER — PREDNISONE 50 MG PO TABS
50.0000 mg | ORAL_TABLET | Freq: Every day | ORAL | Status: DC
Start: 2015-02-08 — End: 2015-03-20

## 2015-02-08 MED ORDER — TRAMADOL HCL 50 MG PO TABS: 50.0000 mg | ORAL_TABLET | Freq: Two times a day (BID) | ORAL | Status: DC | PRN

## 2015-02-08 NOTE — Progress Notes (Signed)
[name] [place], [place] [address] Phone: [phone] Subjective:     CC: Back pain follow up  HRC:[id number] Andrew Joyce is a 55 y.o. male coming in with complaint of back pain. Patient is being seen by neurology for multiple different things and is had chronic irritated back pain for quite some time.  Patient was continued to have thoracic back pain. Had MRI showing small central disc protrusions of T3 and 4 as well as T7 and T8. Patient was sent to have an epidural steroid injection. Patient did not respond to the T3-T4 and did have one at the T7-T8 which also did not give him any benefit. 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 [name] Hx of CAD. Patient saw rheumatology and they stated that they would not change any management at this time. Patient is prone to urinary tract infections and does not want any immune modulators.  Patient was then sent to pain management physician to see if there is any other type of blocks or other type of care that could be beneficial. Patient states he was given facet injections and did not make any significant improvement. Continues to have the pain. Seems to be worse with repetitive motions. Still seems even regular daily activities it gives him trouble. Has not notice any swelling. No numbness. Continues the vitamins and has not notice any significant improvement though. This is still debilitating overall.  Attempted Effexor as well as gabapentin with no improvement and patient did have side effects. Continues to take tramadol on a regular basis.  Past Medical History  Diagnosis Date  . Coronary artery disease     a. s/p CABG in 2008; b.  s/p cath in 2011 for med rx.;  c.  NSTEMI (02/2011):  LHC (02/2011):  LAD occluded, distal LAD filled via left to left collaterals, distal CFX 40-50%, proximal RCA occluded (right to right collaterals), distal RCA occluded, S-RCA occluded  proximally, S-OM1/OM2 occluded (new from 2011 - culprit), L-LAD ok, ant apical HK, EF 45-50% - med Rx.  [name] Dyslipidemia   . Hypertension   . Recurrent UTI   . History of renal stone   . Bladder exstrophy      Congenital- had multiple surgeries as a child  . Chronic kidney disease (CKD), stage II (mild)   . Pancreatitis   . Hx of echocardiogram     Echo (05/2013):  EF 55-60%, inf HK, mild AI, normal RVSF, RVSP 28 mmHg  . Hx of cardiovascular stress test     Nuclear (06/2009):  EF 71%, small area of infarct/ischemia in inferior wall-unchanged from prior   Past Surgical History  Procedure Laterality Date  . Cardiac catheterization  07/28/2009    EF 60%; Grafts patent except for SVG to the AM and PD. Native RCA is occluded.  . Cardiac catheterization  03/20/2006    EF 60-65%  . Coronary artery bypass graft  03/2006    LIMA GRAFT TO LAD, SAPHENOUS VEIN GRAFT TO THE ACUTE MARGINAL BRANCH THE RIGHT CORONARY, SAPHENOUS VEIN GRAFT TO THE PDA, SEQUENTIAL VEIN GRAFT TO THE FIRST AND SECOND OBTUSE MARGINAL VESSELS  . Cardiovascular stress test  06/12/2009    EF 71%, SMALL AREA OF INFARCT/ISCHEMIA IN INFERIOR WALL; FELT TO NOT BE CHANGED.  [name] Left heart catheterization with coronary angiogram N/A 02/26/2011    Procedure: LEFT HEART CATHETERIZATION WITH CORONARY ANGIOGRAM;  Surgeon: Thayer Headings, MD;  Location:  [facility];  Service: Cardiovascular;  Laterality: N/A;   Social History  Substance Use Topics  . Smoking status: Never Smoker   . Smokeless tobacco: None  . Alcohol Use: No   No Known Allergies Family History  Problem Relation Age of Onset  . Hyperlipidemia Mother     Living, 76  . Hypertension Mother   . Hyperlipidemia Father     Living 55  . Hypertension Father   . Healthy Sister   . Healthy Brother     History of elevated ESR and positive C-ANCA      Past medical history, social, surgical and family history all reviewed in electronic medical record.   Review of Systems: No  headache, visual changes, nausea, vomiting, diarrhea, constipation, dizziness, abdominal pain, skin rash, fevers, chills, night sweats, weight loss, swollen lymph nodes, body aches, joint swelling, muscle aches, chest pain, shortness of breath, mood changes.   Objective Blood pressure 132/84, pulse 64, height _0  (1.778 m), weight 170 lb (77.111 kg), SpO2 99 %.  General: No apparent distress alert and oriented x3 mood and affect normal, dressed appropriately.  HEENT: Pupils equal, extraocular movements intact  Respiratory: Patient's speak in full sentences and does not appear short of breath  Cardiovascular: No lower extremity edema, non tender, no erythema  Skin: Warm dry intact with no signs of infection or rash on extremities or on axial skeleton.  Abdomen: Soft nontender  Neuro: Cranial nerves II through XII are intact, neurovascularly intact in all extremities with 2+ DTRs and 2+ pulses.  Lymph: No lymphadenopathy of posterior or anterior cervical chain or axillae bilaterally.  Gait normal with good balance and coordination.  MSK:  Non tender with full range of motion and good stability and symmetric strength and tone of  elbows, wrist, hip, knee and ankles bilaterally.  Continued scapular dysfunction still noted.  Back Exam:  Inspection: Mild decrease in lordosis Motion: Flexion 45 deg, Extension 25 deg, Side Bending to 35 deg bilaterally,  Rotation to 35 deg bilaterally  SLR laying: Negative  XSLR laying: Negative  To need palpable tenderness in the mid thoracic spine. FABER: negative. Sensory change: Gross sensation intact to all lumbar and sacral dermatomes.  Reflexes: 2+ at both patellar tendons, 2+ at achilles tendons, Babinski's downgoing.  Strength at foot  Plantar-flexion: 5/5 Dorsi-flexion: 5/5 Eversion: 5/5 Inversion: 5/5  Leg strength  Quad: 5/5 Hamstring: 5/5 Hip flexor: 5/5 Hip abductors: 4/5  Gait unremarkable.     Impression and Recommendations:     This  case required medical decision making of moderate complexity.

## 2015-02-08 NOTE — Patient Instructions (Signed)
Good to see you  Dr, Amil Amen office will be calling you he is a rheumatologist Prednisone daily for 5 days and send me a message in 5 days and tell me how you are doing.  In the meantime consider taking bromelian with each meal, 2400IU  Probitoic greater than 10 billion units and grerater than 10 strains.  Also look into [place] to help with inflammation.  After the rheumatologist lets touch base again to see how you are doing.

## 2015-02-08 NOTE — Assessment & Plan Note (Addendum)
Still concern for patient having more of a rheumatological disease. We discussed icing regimen, home exercises and continue with the postural changes. Patient does not have any marked weakness noted. I do believe that patient's positives antibody I would like him to be evaluated by rheumatologist. Patient was seen by one previously but stated that she spent less than 30 seconds in the room and told her to stop doing any lifting. He was not happy with this suggestion. At this time I do feel that giving him prednisone to see how he reacts could be beneficial. In addition of this we discussed some over-the-counter medications a could help. Other than this patient will follow-up with me after rheumatology. Refilled the patient's tramadol given today. Did not respond to osteopathic manipulation previously.  Spent  25 minutes with patient face-to-face and had greater than 50% of counseling including as described above in assessment and plan.

## 2015-02-08 NOTE — Progress Notes (Signed)
Pre visit review using our clinic review tool, if applicable. No additional management support is needed unless otherwise documented below in the visit note. 

## 2015-02-20 ENCOUNTER — Other Ambulatory Visit: Payer: Self-pay | Admitting: Cardiology

## 2015-02-21 ENCOUNTER — Ambulatory Visit (INDEPENDENT_AMBULATORY_CARE_PROVIDER_SITE_OTHER): Payer: Medicare Other | Admitting: Cardiology

## 2015-02-21 ENCOUNTER — Encounter: Payer: Self-pay | Admitting: Cardiology

## 2015-02-21 VITALS — BP 150/84 | HR 90 | Ht 70.0 in | Wt 172.5 lb

## 2015-02-21 DIAGNOSIS — I25119 Atherosclerotic heart disease of native coronary artery with unspecified angina pectoris: Secondary | ICD-10-CM

## 2015-02-21 DIAGNOSIS — N182 Chronic kidney disease, stage 2 (mild): Secondary | ICD-10-CM | POA: Diagnosis not present

## 2015-02-21 DIAGNOSIS — M546 Pain in thoracic spine: Secondary | ICD-10-CM | POA: Diagnosis not present

## 2015-02-21 DIAGNOSIS — I10 Essential (primary) hypertension: Secondary | ICD-10-CM | POA: Diagnosis not present

## 2015-02-21 DIAGNOSIS — R768 Other specified abnormal immunological findings in serum: Secondary | ICD-10-CM | POA: Diagnosis not present

## 2015-02-21 DIAGNOSIS — E785 Hyperlipidemia, unspecified: Secondary | ICD-10-CM

## 2015-02-21 NOTE — Patient Instructions (Addendum)
We will check blood work for cholesterol  I will see you in 6 months

## 2015-02-21 NOTE — Progress Notes (Signed)
Cardiology Office Note   Date:  02/21/2015   ID:  [name], DOB [birth year], MRN [id number]  PCP:  Mauricio Po, FNP  Cardiologist:  Dr. Carri Spillers [place]      History of Present Illness: Andrew Joyce is a 55 y.o. male seen for follow up CAD. He has a hx of CAD s/p CABG in 2008, HTN, HL, CKD.  He suffered a non-STEMI in February 2013. Culprit was felt to be an occluded SVG-OM1/OM2. Native circumflex had 40-50% distal stenosis. There were no targets for revascularization and he was treated medically.   He has a history orthostatic intolerance. Imdur dose was reduced.     Echo May 2015 demonstrated EF 55-60%, mild inferior hypokinesis, top normal LA/RA size.Poorly visualized aortic valve, however, there is mild central AI - no stenosis.    On follow up today he is doing well from a cardiac standpoint. He has no chest pain or SOB. He stays active. He was having some pain in his back at times. Seen by rheumatology with no acute findings.     Recent Labs: 07/14/2014: ALT 22; Creat 1.21; Potassium 4.1  Wt Readings from Last 3 Encounters:  02/21/15 78.245 kg (172 lb 8 oz)  02/08/15 77.111 kg (170 lb)  02/02/15 77.565 kg (171 lb)     Past Medical History  Diagnosis Date  . Coronary artery disease     a. s/p CABG in 2008; b.  s/p cath in 2011 for med rx.;  c.  NSTEMI (02/2011):  LHC (02/2011):  LAD occluded, distal LAD filled via left to left collaterals, distal CFX 40-50%, proximal RCA occluded (right to right collaterals), distal RCA occluded, S-RCA occluded proximally, S-OM1/OM2 occluded (new from 2011 - culprit), L-LAD ok, ant apical HK, EF 45-50% - med Rx.  [name] Dyslipidemia   . Hypertension   . Recurrent UTI   . History of renal stone   . Bladder exstrophy      Congenital- had multiple surgeries as a child  . Chronic kidney disease (CKD), stage II (mild)   . Pancreatitis   . Hx of echocardiogram     Echo (05/2013):  EF 55-60%, inf HK, mild AI, normal RVSF, RVSP 28  mmHg  . Hx of cardiovascular stress test     Nuclear (06/2009):  EF 71%, small area of infarct/ischemia in inferior wall-unchanged from prior    Current Outpatient Prescriptions  Medication Sig Dispense Refill  . amLODipine (NORVASC) 5 MG tablet Take 1 tablet (5 mg total) by mouth daily. 30 tablet 6  . aspirin 81 MG tablet Take 1 tablet (81 mg total) by mouth daily.    . baclofen (LIORESAL) 10 MG tablet TAKE 2 TABLETS BY MOUTH IN THE MORNING, AND THEN 1 TAB AT LUNCHTIME, AND THEN 3 TABS AT BEDTIME 180 tablet 11  . cephALEXin (KEFLEX) 250 MG capsule Take 250 mg by mouth at bedtime.  0  . fish oil-omega-3 fatty acids 1000 MG capsule Take 2 capsules (2 g total) by mouth 2 (two) times daily.    . isosorbide mononitrate (IMDUR) 60 MG 24 hr tablet TAKE ONE & ONE-HALF TABLETS BY MOUTH ONCE DAILY 45 tablet 6  . losartan (COZAAR) 100 MG tablet Take 1 tablet (100 mg total) by mouth daily. 30 tablet 11  . metoprolol (LOPRESSOR) 50 MG tablet TAKE ONE TABLET BY MOUTH TWICE DAILY 60 tablet 0  . nitroGLYCERIN (NITROSTAT) 0.4 MG SL tablet Place 1 tablet (0.4 mg total) under the tongue every 5 (  five) minutes as needed. For chest pain 25 tablet 3  . pravastatin (PRAVACHOL) 40 MG tablet Take 1 tablet (40 mg total) by mouth daily. 30 tablet 2  . predniSONE (DELTASONE) 50 MG tablet Take 1 tablet (50 mg total) by mouth daily. 5 tablet 0  . traMADol (ULTRAM) 50 MG tablet Take 1 tablet (50 mg total) by mouth every 12 (twelve) hours as needed. 60 tablet 3   No current facility-administered medications for this visit.    Allergies:   Review of patient's allergies indicates no known allergies.   Social History:  The patient  reports that he has never smoked. He does not have any smokeless tobacco history on file. He reports that he does not drink alcohol or use illicit drugs.   Family History:  The patient's family history includes Healthy in his brother and sister; Hyperlipidemia in his father and mother;  Hypertension in his father and mother.   ROS:  Please see the history of present illness.     All other systems reviewed and negative.   PHYSICAL EXAM: VS:  BP 150/84 mmHg  Pulse 90  Ht 5\' 10"  (1.778 m)  Wt 78.245 kg (172 lb 8 oz)  BMI 24.75 kg/m2  Well nourished, well developed, in no acute distress HEENT: normal Neck: no JVD Cardiac:  normal S1, S2; RRR; no murmur Lungs:  clear to auscultation bilaterally, no wheezing, rhonchi or rales Abd: soft, nontender, no hepatomegaly Ext: no edema Skin: warm and dry Neuro:  CNs 2-12 intact, no focal abnormalities noted    ASSESSMENT AND PLAN:  1. Orthostatic intolerance: he is now asymptomatic. 2. CAD (coronary artery disease): s/p remote CABG in 2008. Occluded SVG to OM1 and OM2 in 2013.  Continue aspirin, Plavix, nitrates, beta blocker. Asymptomatic.  3. HTN (hypertension):  Controlled.  4. Hyperlipidemia:   Continue statin. Will follow up fasting lab work today. 5. CKD (chronic kidney disease) stage 2, GFR 60-89 ml/min:  Repeat lab today.  I will follow up in 6 months.

## 2015-03-01 DIAGNOSIS — I209 Angina pectoris, unspecified: Secondary | ICD-10-CM | POA: Diagnosis not present

## 2015-03-01 DIAGNOSIS — E785 Hyperlipidemia, unspecified: Secondary | ICD-10-CM | POA: Diagnosis not present

## 2015-03-01 DIAGNOSIS — I10 Essential (primary) hypertension: Secondary | ICD-10-CM | POA: Diagnosis not present

## 2015-03-01 DIAGNOSIS — N182 Chronic kidney disease, stage 2 (mild): Secondary | ICD-10-CM | POA: Diagnosis not present

## 2015-03-01 DIAGNOSIS — I25119 Atherosclerotic heart disease of native coronary artery with unspecified angina pectoris: Secondary | ICD-10-CM | POA: Diagnosis not present

## 2015-03-01 DIAGNOSIS — I2510 Atherosclerotic heart disease of native coronary artery without angina pectoris: Secondary | ICD-10-CM | POA: Diagnosis not present

## 2015-03-02 LAB — LIPID PANEL
Cholesterol: 234 mg/dL — ABNORMAL HIGH (ref 125–200)
HDL: 28 mg/dL — AB (ref >=40)
TRIGLYCERIDES: 1121 mg/dL — AB (ref <150)
Total CHOL/HDL Ratio: 8.4 Ratio — ABNORMAL HIGH (ref <=5.0)

## 2015-03-02 LAB — BASIC METABOLIC PANEL
BUN: 32 mg/dL — AB (ref 7–25)
CHLORIDE: 100 mmol/L (ref 98–110)
CO2: 26 mmol/L (ref 20–31)
CREATININE: 1.38 mg/dL — AB (ref 0.70–1.33)
Calcium: 9.1 mg/dL (ref 8.6–10.3)
Glucose, Bld: 132 mg/dL — ABNORMAL HIGH (ref 65–99)
POTASSIUM: 4.6 mmol/L (ref 3.5–5.3)
Sodium: 139 mmol/L (ref 135–146)

## 2015-03-02 LAB — HEPATIC FUNCTION PANEL
ALBUMIN: 4 g/dL (ref 3.6–5.1)
ALK PHOS: 58 U/L (ref 40–115)
ALT: 26 U/L (ref 9–46)
AST: 22 U/L (ref 10–35)
BILIRUBIN TOTAL: 0.5 mg/dL (ref 0.2–1.2)
Bilirubin, Direct: <0.1 mg/dL (ref <=0.2)
TOTAL PROTEIN: 6.8 g/dL (ref 6.1–8.1)

## 2015-03-02 LAB — LDL CHOLESTEROL, DIRECT: LDL DIRECT: 45 mg/dL (ref <130)

## 2015-03-03 ENCOUNTER — Other Ambulatory Visit: Payer: Self-pay

## 2015-03-03 DIAGNOSIS — E785 Hyperlipidemia, unspecified: Secondary | ICD-10-CM

## 2015-03-03 MED ORDER — FENOFIBRATE 160 MG PO TABS: 160.0000 mg | ORAL_TABLET | Freq: Every day | ORAL | Status: DC

## 2015-03-07 ENCOUNTER — Ambulatory Visit: Payer: Self-pay | Admitting: Family Medicine

## 2015-03-15 DIAGNOSIS — N3020 Other chronic cystitis without hematuria: Secondary | ICD-10-CM | POA: Diagnosis not present

## 2015-03-15 DIAGNOSIS — N312 Flaccid neuropathic bladder, not elsewhere classified: Secondary | ICD-10-CM | POA: Diagnosis not present

## 2015-03-15 DIAGNOSIS — Z0000 Encounter for general adult medical examination without abnormal findings: Secondary | ICD-10-CM | POA: Diagnosis not present

## 2015-03-15 DIAGNOSIS — N200 Calculus of kidney: Secondary | ICD-10-CM | POA: Diagnosis not present

## 2015-03-20 ENCOUNTER — Encounter: Payer: Self-pay | Admitting: Family Medicine

## 2015-03-20 ENCOUNTER — Ambulatory Visit (INDEPENDENT_AMBULATORY_CARE_PROVIDER_SITE_OTHER): Payer: Medicare Other | Admitting: Family Medicine

## 2015-03-20 VITALS — BP 114/78 | HR 62 | Ht 70.0 in | Wt 171.0 lb

## 2015-03-20 DIAGNOSIS — M546 Pain in thoracic spine: Secondary | ICD-10-CM | POA: Diagnosis not present

## 2015-03-20 DIAGNOSIS — I25119 Atherosclerotic heart disease of native coronary artery with unspecified angina pectoris: Secondary | ICD-10-CM

## 2015-03-20 DIAGNOSIS — G8929 Other chronic pain: Secondary | ICD-10-CM | POA: Diagnosis not present

## 2015-03-20 MED ORDER — VENLAFAXINE HCL ER 37.5 MG PO CP24: 37.5000 mg | ORAL_CAPSULE | Freq: Every day | ORAL | Status: DC

## 2015-03-20 NOTE — Assessment & Plan Note (Signed)
Patient states that he continues to have this difficulty. Can limit his activities of daily living. We have done many different labs and interventions without any significant success. We did discuss again that this could be more on a chronic pain type spectrum. Patient has limited to try medication. We have tried Effexor in the past but patient would like to see if he can respond with this time. States that he did not take it on a regular basis. Once again secondary to his cardiovascular risk factors I do not think we will get about 75 mg daily so we'll start a 37.5 mg. Warned of potential side effects. Discussed tramadol possible interaction patient will monitor. Still has muscle relaxer for breakthrough pain. Patient would like some other type of medication to help with pain. We may need to consider belviq   Spent  25 minutes with patient face-to-face and had greater than 50% of counseling including as described above in assessment and plan.

## 2015-03-20 NOTE — Progress Notes (Signed)
Pre visit review using our clinic review tool, if applicable. No additional management support is needed unless otherwise documented below in the visit note. 

## 2015-03-20 NOTE — Patient Instructions (Signed)
Good to see you  Ice is your friend I am sorry for no answer but we will keep working at it.  Effexor 37.5 mg daily  Send me a message in 2 weeks and tell me how you are doing  We will try to increase slowly and will need to try it 4-6 weeks at least  See me again in 6 weeks.

## 2015-03-20 NOTE — Progress Notes (Signed)
[facility] [address] [place], [place] [address] Phone: [phone] Subjective:     CC: Back pain follow up  [id number] Andrew Joyce is a 55 y.o. male coming in with complaint of back pain. Patient is being seen by neurology for multiple different things and is had chronic irritated back pain for quite some time.  Patient was continued to have thoracic back pain. Had MRI showing small central disc protrusions of T3 and 4 as well as T7 and T8. Patient was sent to have an epidural steroid injection. Patient did not respond to the T3-T4 and did have one at the T7-T8 which also did not give him any benefit. 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 [name] Hx of CAD. Patient saw rheumatology and they stated that they would not change any management at this time. Patient is prone to urinary tract infections and does not want any immune modulators.  Patient was then sent to pain management physician to see if there is any other type of blocks or other type of care that could be beneficial. Patient states he was given facet injections and did not make any significant improvement.  Patient recently saw another rheumatologist. Rheumatology said that there is no other significant treatment options at this time. We discussed icing regimen and home exercises.  Continues to have symptoms. Continues to have difficulty whenever he uses the right arm and seems to fatigue.  Attempted Effexor as well as gabapentin with no improvement and patient did have side effects. Continues to take tramadol on a regular basis.Patient states that demand have not taking medications on a regular basis.  Past Medical History  Diagnosis Date  . Coronary artery disease     a. s/p CABG in 2008; b.  s/p cath in 2011 for med rx.;  c.  NSTEMI (02/2011):  LHC (02/2011):  LAD occluded, distal LAD filled via left to left collaterals, distal CFX 40-50%, proximal RCA  occluded (right to right collaterals), distal RCA occluded, S-RCA occluded proximally, S-OM1/OM2 occluded (new from 2011 - culprit), L-LAD ok, ant apical HK, EF 45-50% - med Rx.  [name] Dyslipidemia   . Hypertension   . Recurrent UTI   . History of renal stone   . Bladder exstrophy      Congenital- had multiple surgeries as a child  . Chronic kidney disease (CKD), stage II (mild)   . Pancreatitis   . Hx of echocardiogram     Echo (05/2013):  EF 55-60%, inf HK, mild AI, normal RVSF, RVSP 28 mmHg  . Hx of cardiovascular stress test     Nuclear (06/2009):  EF 71%, small area of infarct/ischemia in inferior wall-unchanged from prior   Past Surgical History  Procedure Laterality Date  . Cardiac catheterization  07/28/2009    EF 60%; Grafts patent except for SVG to the AM and PD. Native RCA is occluded.  . Cardiac catheterization  03/20/2006    EF 60-65%  . Coronary artery bypass graft  03/2006    LIMA GRAFT TO LAD, SAPHENOUS VEIN GRAFT TO THE ACUTE MARGINAL BRANCH THE RIGHT CORONARY, SAPHENOUS VEIN GRAFT TO THE PDA, SEQUENTIAL VEIN GRAFT TO THE FIRST AND SECOND OBTUSE MARGINAL VESSELS  . Cardiovascular stress test  06/12/2009    EF 71%, SMALL AREA OF INFARCT/ISCHEMIA IN INFERIOR WALL; FELT TO NOT BE CHANGED.  [name] Left heart catheterization with coronary angiogram N/A 02/26/2011    Procedure: LEFT HEART CATHETERIZATION  WITH CORONARY ANGIOGRAM;  Surgeon: Thayer Headings, MD;  Location: [facility] CATH LAB;  Service: Cardiovascular;  Laterality: N/A;   Social History  Substance Use Topics  . Smoking status: Never Smoker   . Smokeless tobacco: None  . Alcohol Use: No   No Known Allergies Family History  Problem Relation Age of Onset  . Hyperlipidemia Mother     Living, 28  . Hypertension Mother   . Hyperlipidemia Father     Living 33  . Hypertension Father   . Healthy Sister   . Healthy Brother     History of elevated ESR and positive C-ANCA      Past medical history, social, surgical and family  history all reviewed in electronic medical record.   Review of Systems: No headache, visual changes, nausea, vomiting, diarrhea, constipation, dizziness, abdominal pain, skin rash, fevers, chills, night sweats, weight loss, swollen lymph nodes, body aches, joint swelling, muscle aches, chest pain, shortness of breath, mood changes.   Objective Blood pressure 114/78, pulse 62, height _0  (1.778 m), weight 171 lb (77.565 kg), SpO2 98 %.  General: No apparent distress alert and oriented x3 mood and affect normal, dressed appropriately.  HEENT: Pupils equal, extraocular movements intact  Respiratory: Patient's speak in full sentences and does not appear short of breath  Cardiovascular: No lower extremity edema, non tender, no erythema  Skin: Warm dry intact with no signs of infection or rash on extremities or on axial skeleton.  Abdomen: Soft nontender  Neuro: Cranial nerves II through XII are intact, neurovascularly intact in all extremities with 2+ DTRs and 2+ pulses.  Lymph: No lymphadenopathy of posterior or anterior cervical chain or axillae bilaterally.  Gait normal with good balance and coordination.  MSK:  Non tender with full range of motion and good stability and symmetric strength and tone of  elbows, wrist, hip, knee and ankles bilaterally.  Continued mild scapular dysfunction  Back Exam:  Inspection: Mild decrease in lordosis Motion: Flexion 45 deg, Extension 25 deg, Side Bending to 35 deg bilaterally,  Rotation to 35 deg bilaterally  SLR laying: Negative  XSLR laying: Negative  Continued mild discomfort in the thoracic spine FABER: negative. Sensory change: Gross sensation intact to all lumbar and sacral dermatomes.  Reflexes: 2+ at both patellar tendons, 2+ at achilles tendons, Babinski's downgoing.  Strength at foot  Plantar-flexion: 5/5 Dorsi-flexion: 5/5 Eversion: 5/5 Inversion: 5/5  Leg strength  Quad: 5/5 Hamstring: 5/5 Hip flexor: 5/5 Hip abductors: 4/5  Gait  unremarkable.     Impression and Recommendations:     This case required medical decision making of moderate complexity.

## 2015-03-21 ENCOUNTER — Other Ambulatory Visit: Payer: Self-pay | Admitting: Cardiology

## 2015-03-21 NOTE — Telephone Encounter (Signed)
REFILL 

## 2015-04-10 ENCOUNTER — Other Ambulatory Visit: Payer: Self-pay | Admitting: Cardiology

## 2015-05-01 ENCOUNTER — Encounter: Payer: Self-pay | Admitting: Family Medicine

## 2015-05-01 ENCOUNTER — Ambulatory Visit (INDEPENDENT_AMBULATORY_CARE_PROVIDER_SITE_OTHER): Payer: Medicare Other | Admitting: Family Medicine

## 2015-05-01 VITALS — BP 120/70 | HR 62 | Ht 70.0 in | Wt 167.0 lb

## 2015-05-01 DIAGNOSIS — I25119 Atherosclerotic heart disease of native coronary artery with unspecified angina pectoris: Secondary | ICD-10-CM

## 2015-05-01 DIAGNOSIS — G8929 Other chronic pain: Secondary | ICD-10-CM

## 2015-05-01 DIAGNOSIS — M546 Pain in thoracic spine: Secondary | ICD-10-CM

## 2015-05-01 MED ORDER — TRAMADOL HCL 50 MG PO TABS: 50.0000 mg | ORAL_TABLET | Freq: Three times a day (TID) | ORAL | Status: DC | PRN

## 2015-05-01 MED ORDER — VENLAFAXINE HCL ER 75 MG PO CP24: 75.0000 mg | ORAL_CAPSULE | Freq: Every day | ORAL | Status: DC

## 2015-05-01 NOTE — Patient Instructions (Signed)
Good to see you  Increase effexor to 75mg  and this will be as high as we go.  Tramadol up to 3 times a day max. Does work better with a tylenol Bromelain 2500 GCU with each meal can help See me again in 4-6 weeks to make sure we are doing well.

## 2015-05-01 NOTE — Progress Notes (Signed)
[name], [place] [address] Phone: [phone] Subjective:     CC: Back pain follow up  [id number] Andrew Joyce is a 55 y.o. male coming in with complaint of back pain. Patient is being seen by neurology for multiple different things and is had chronic irritated back pain for quite some time.  Patient was continued to have thoracic back pain. Had MRI showing small central disc protrusions of T3 and 4 as well as T7 and T8. Patient was sent to have an epidural steroid injection. Patient did not respond to the T3-T4 and did have one at the T7-T8 which also did not give him any benefit. 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 [name] Hx of CAD. Patient saw rheumatology and they stated that they would not change any management at this time. Patient is prone to urinary tract infections and does not want any immune modulators.  Patient was then sent to pain management physician to see if there is any other type of blocks or other type of care that could be beneficial. Patient states he was given facet injections and did not make any significant improvement.  Patient recently saw another rheumatologist. Rheumatology said that there is no other significant treatment options at this time. We discussed icing regimen and home exercises.  Continues to have symptoms. Patient was started on Effexor at this point. We did stop the gabapentin because he was having side effects. Continues to take tramadol 2 times a day and feels like he needs a splint 3 times a day would be perfect. Once again wants to avoid any type of surgical management. Rheumatology did not think anything else beneficial. Still seems to be worse in the mornings and slowly gets a little better maybe throughout the day.    Past Medical History  Diagnosis Date  . Coronary artery disease     a. s/p CABG in 2008; b.  s/p cath in 2011 for med rx.;  c.   NSTEMI (02/2011):  LHC (02/2011):  LAD occluded, distal LAD filled via left to left collaterals, distal CFX 40-50%, proximal RCA occluded (right to right collaterals), distal RCA occluded, S-RCA occluded proximally, S-OM1/OM2 occluded (new from 2011 - culprit), L-LAD ok, ant apical HK, EF 45-50% - med Rx.  [name] Dyslipidemia   . Hypertension   . Recurrent UTI   . History of renal stone   . Bladder exstrophy      Congenital- had multiple surgeries as a child  . Chronic kidney disease (CKD), stage II (mild)   . Pancreatitis   . Hx of echocardiogram     Echo (05/2013):  EF 55-60%, inf HK, mild AI, normal RVSF, RVSP 28 mmHg  . Hx of cardiovascular stress test     Nuclear (06/2009):  EF 71%, small area of infarct/ischemia in inferior wall-unchanged from prior   Past Surgical History  Procedure Laterality Date  . Cardiac catheterization  07/28/2009    EF 60%; Grafts patent except for SVG to the AM and PD. Native RCA is occluded.  . Cardiac catheterization  03/20/2006    EF 60-65%  . Coronary artery bypass graft  03/2006    LIMA GRAFT TO LAD, SAPHENOUS VEIN GRAFT TO THE ACUTE MARGINAL BRANCH THE RIGHT CORONARY, SAPHENOUS VEIN GRAFT TO THE PDA, SEQUENTIAL VEIN GRAFT TO THE FIRST AND SECOND OBTUSE MARGINAL VESSELS  . Cardiovascular stress test  06/12/2009    EF 71%,  SMALL AREA OF INFARCT/ISCHEMIA IN INFERIOR WALL; FELT TO NOT BE CHANGED.  [name] Left heart catheterization with coronary angiogram N/A 02/26/2011    Procedure: LEFT HEART CATHETERIZATION WITH CORONARY ANGIOGRAM;  Surgeon: Thayer Headings, MD;  Location: [facility] CATH LAB;  Service: Cardiovascular;  Laterality: N/A;   Social History  Substance Use Topics  . Smoking status: Never Smoker   . Smokeless tobacco: None  . Alcohol Use: No   No Known Allergies Family History  Problem Relation Age of Onset  . Hyperlipidemia Mother     Living, 58  . Hypertension Mother   . Hyperlipidemia Father     Living 31  . Hypertension Father   . Healthy Sister   .  Healthy Brother     History of elevated ESR and positive C-ANCA      Past medical history, social, surgical and family history all reviewed in electronic medical record.   Review of Systems: No headache, visual changes, nausea, vomiting, diarrhea, constipation, dizziness, abdominal pain, skin rash, fevers, chills, night sweats, weight loss, swollen lymph nodes, body aches, joint swelling, muscle aches, chest pain, shortness of breath, mood changes.   Objective Blood pressure 120/70, pulse 62, height 5' 10"  (1.778 m), weight 167 lb (75.751 kg), SpO2 97 %.  General: No apparent distress alert and oriented x3 mood and affect normal, dressed appropriately.  HEENT: Pupils equal, extraocular movements intact  Respiratory: Patient's speak in full sentences and does not appear short of breath  Cardiovascular: No lower extremity edema, non tender, no erythema  Skin: Warm dry intact with no signs of infection or rash on extremities or on axial skeleton.  Abdomen: Soft nontender  Neuro: Cranial nerves II through XII are intact, neurovascularly intact in all extremities with 2+ DTRs and 2+ pulses.  Lymph: No lymphadenopathy of posterior or anterior cervical chain or axillae bilaterally.  Gait normal with good balance and coordination.  MSK:  Non tender with full range of motion and good stability and symmetric strength and tone of  elbows, wrist, hip, knee and ankles bilaterally.  Continued mild scapular dysfunction  Back Exam:  Inspection: Mild decrease in lordosis Motion: Flexion 45 deg, Extension 25 deg, Side Bending to 35 deg bilaterally,  Rotation to 35 deg bilaterally  SLR laying: Negative  XSLR laying: Negative  Continued mild discomfort in the thoracic spineno significant difference in previously. Still mostly in the paraspinal musculature with no spinous process tenderness. FABER: negative. Sensory change: Gross sensation intact to all lumbar and sacral dermatomes.  Reflexes: 2+ at  both patellar tendons, 2+ at achilles tendons, Babinski's downgoing.  Strength at foot  Plantar-flexion: 5/5 Dorsi-flexion: 5/5 Eversion: 5/5 Inversion: 5/5  Leg strength  Quad: 5/5 Hamstring: 5/5 Hip flexor: 5/5 Hip abductors: 4/5  Gait unremarkable. Minimal change    Impression and Recommendations:     This case required medical decision making of moderate complexity.

## 2015-05-01 NOTE — Progress Notes (Signed)
Pre visit review using our clinic review tool, if applicable. No additional management support is needed unless otherwise documented below in the visit note. 

## 2015-05-01 NOTE — Assessment & Plan Note (Signed)
Continues to have the chronic thoracic back pain. Very difficult been treating because we did not have a true diagnosis. Likely was in the chronic pain-like syndrome category. 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 Has seen rheumatology. At this time we will increase patient's Effexor. We will not go up any higher than 75 mg a GERD to patient's cardiovascular risk factors. Patient continues to have pain and we will increase patient's tramadol 03 times daily. At that point if any more pain medications as necessary he would have to go to pain management. Patient would like to avoid this of course. We discussed different exercises still. Discussed icing protocol. Discussed over-the-counter medications. Follow-up with me again in 4-6 weeks for further evaluation.  Spent  25 minutes with patient face-to-face and had greater than 50% of counseling including as described above in assessment and plan.

## 2015-05-10 DIAGNOSIS — N400 Enlarged prostate without lower urinary tract symptoms: Secondary | ICD-10-CM | POA: Diagnosis not present

## 2015-05-10 DIAGNOSIS — N3020 Other chronic cystitis without hematuria: Secondary | ICD-10-CM | POA: Diagnosis not present

## 2015-05-10 DIAGNOSIS — Q640 Epispadias: Secondary | ICD-10-CM | POA: Diagnosis not present

## 2015-05-10 DIAGNOSIS — Z0000 Encounter for general adult medical examination without abnormal findings: Secondary | ICD-10-CM | POA: Diagnosis not present

## 2015-05-10 DIAGNOSIS — Q6410 Exstrophy of urinary bladder, unspecified: Secondary | ICD-10-CM | POA: Diagnosis not present

## 2015-05-10 DIAGNOSIS — N312 Flaccid neuropathic bladder, not elsewhere classified: Secondary | ICD-10-CM | POA: Diagnosis not present

## 2015-05-18 ENCOUNTER — Ambulatory Visit (INDEPENDENT_AMBULATORY_CARE_PROVIDER_SITE_OTHER): Payer: Medicare Other | Admitting: Neurology

## 2015-05-18 ENCOUNTER — Encounter: Payer: Self-pay | Admitting: Neurology

## 2015-05-18 ENCOUNTER — Other Ambulatory Visit: Payer: Self-pay | Admitting: *Deleted

## 2015-05-18 VITALS — BP 118/78 | HR 60 | Ht 70.0 in | Wt 168.5 lb

## 2015-05-18 DIAGNOSIS — G709 Myoneural disorder, unspecified: Secondary | ICD-10-CM | POA: Diagnosis not present

## 2015-05-18 DIAGNOSIS — G252 Other specified forms of tremor: Secondary | ICD-10-CM | POA: Diagnosis not present

## 2015-05-18 DIAGNOSIS — I25119 Atherosclerotic heart disease of native coronary artery with unspecified angina pectoris: Secondary | ICD-10-CM | POA: Diagnosis not present

## 2015-05-18 DIAGNOSIS — R253 Fasciculation: Secondary | ICD-10-CM

## 2015-05-18 MED ORDER — VENLAFAXINE HCL ER 75 MG PO CP24: 75.0000 mg | ORAL_CAPSULE | Freq: Every day | ORAL | Status: DC

## 2015-05-18 MED ORDER — DIAZEPAM 5 MG PO TABS: ORAL_TABLET | ORAL | Status: DC

## 2015-05-18 NOTE — Telephone Encounter (Signed)
Re-sent rx into pharmacy for 90-day supply.

## 2015-05-18 NOTE — Progress Notes (Signed)
Follow-up Visit   Date: 05/18/2015    Andrew Joyce MRN: [id number] DOB: 05-Jun-1960   Interim History: Andrew Joyce is a 55 y.o. left-handed Caucasian male with congenital exstrophy status post multiple surgeries complicated by chronic UTI, CAD s/p CABG (2008), hyperlipidemia, hypertension, and stage II CKD returning to the clinic for follow-up of muscle cramps and leg weakness.   History of present illness: Starting in October 2015, he developed "firing nerves" and cramping of the left legs. Symptoms are worse at night and keep him from sleeping because of painful cramps. They usually last several minutes. He has no associated weakness or similar symptoms on the right leg or arms. He went to see his PCP who recommended melatonin, muscle relaxants (robaxin), ambien, and heat packs. He also tried taking valium at night which did not help much. Since starting baclofen the agitation of the leg and cramps improved.  His left leg weakness remains unchanged.  In 2016, he noticed new twitches involving his right lower leg, but no associated weakness.  He also complains of severe right mid-back pain, worse with any type of activity.  He was discharged from PT early because there was no improvement.  MRI thoracic spine did not show any nerve impingement.     UPDATE 08/18/2014:   Muscle cramps are stable, no worsening.  His back pain is also no worse than before and he has noticed some benefith with doing the exercises as recommended by Dr. Tamala Julian.  Because his back pain remains persistent, he is planning on having steroid injection to the region to see if this helps.  He has no new complaints.  Specifically, no new weakness.  He has noticed a few muscle twitches in his left triceps, but this is rare.    UPDATE 11/18/2014:  Overall, symptoms are stable.  He continues to have muscle cramps and twitches of the legs and right scapula pain which has not worsened.  No new weakness.   He has  been working on strengthening his left leg and is able to push and lift the same weight on each leg now.  UPDATE 05/18/2015:  He complains of increased painful muscle cramps affecting the left posterior calf, lasting up to 15-minutes and occuring 1-2 times daily. He always has to stop what he is doing and wait until it relaxes, but the pain can be severe.  He has not been exercising as much because of concern that it may trigger the leg to cramp.  He has seen a number of physicians for his mid thoracic pain and unfortunately has not been able to achieve any significant pain relief.   No new weakness of the arms or face.    Medications:  Current Outpatient Prescriptions on File Prior to Visit  Medication Sig Dispense Refill  . amLODipine (NORVASC) 5 MG tablet Take 1 tablet (5 mg total) by mouth daily. 30 tablet 6  . aspirin 81 MG tablet Take 1 tablet (81 mg total) by mouth daily.    . baclofen (LIORESAL) 10 MG tablet TAKE 2 TABLETS BY MOUTH IN THE MORNING, AND THEN 1 TAB AT LUNCHTIME, AND THEN 3 TABS AT BEDTIME 180 tablet 11  . cephALEXin (KEFLEX) 250 MG capsule Take 250 mg by mouth at bedtime.  0  . fenofibrate 160 MG tablet Take 1 tablet (160 mg total) by mouth daily. 30 tablet 6  . fish oil-omega-3 fatty acids 1000 MG capsule Take 2 capsules (2 g total) by mouth 2 (  two) times daily.    . isosorbide mononitrate (IMDUR) 60 MG 24 hr tablet TAKE ONE & ONE-HALF TABLETS BY MOUTH ONCE DAILY 45 tablet 6  . losartan (COZAAR) 100 MG tablet Take 1 tablet (100 mg total) by mouth daily. 30 tablet 11  . metoprolol (LOPRESSOR) 50 MG tablet TAKE ONE TABLET BY MOUTH TWICE DAILY 60 tablet 5  . nitroGLYCERIN (NITROSTAT) 0.4 MG SL tablet Place 1 tablet (0.4 mg total) under the tongue every 5 (five) minutes as needed. For chest pain 25 tablet 3  . pravastatin (PRAVACHOL) 40 MG tablet TAKE ONE TABLET BY MOUTH ONCE DAILY 30 tablet 4  . traMADol (ULTRAM) 50 MG tablet Take 1 tablet (50 mg total) by mouth 3 (three) times  daily as needed. 90 tablet 3   No current facility-administered medications on file prior to visit.    Allergies: No Known Allergies  Review of Systems:  CONSTITUTIONAL: No fevers, chills, night sweats, or weight loss.  EYES: No visual changes or eye pain ENT: No hearing changes.  No history of nose bleeds.   RESPIRATORY: No cough, wheezing and shortness of breath.   CARDIOVASCULAR: Negative for chest pain, and palpitations.   GI: Negative for abdominal discomfort, blood in stools or black stools.  No recent change in bowel habits.   GU:  No history of incontinence.   MUSCLOSKELETAL: No history of joint pain or swelling.  +cramps.   SKIN: Negative for lesions, rash, and itching.   ENDOCRINE: Negative for cold or heat intolerance, polydipsia or goiter.   PSYCH:  No depression or anxiety symptoms.   NEURO: As Above.   Vital Signs:  BP 118/78 mmHg  Pulse 60  Ht _0  (1.778 m)  Wt 168 lb 8 oz (76.431 kg)  BMI 24.18 kg/m2  SpO2 97%  Neurological Exam: MENTAL STATUS including orientation to time, place, person, recent and remote memory, attention span and concentration, language, and fund of knowledge is normal.  Speech is not dysarthric.  CRANIAL NERVES:  Pupils equal round and reactive to light.  Face is symmetric.   MOTOR:  Moderate atrophy of the left lower leg (quads, medial gastroc). Prominent and active fasciculations of the left > right calf. Normal tone.   Right Upper Extremity:    Left Upper Extremity:    Deltoid  5/5   Deltoid  5/5   Biceps  5/5   Biceps  5/5   Triceps  5/5   Triceps  5/5   Wrist extensors  5/5   Wrist extensors  5/5   Wrist flexors  5/5   Wrist flexors  5/5   Finger extensors  5/5   Finger extensors  5/5   Finger flexors  5/5   Finger flexors  5/5   Dorsal interossei  5/5   Dorsal interossei  5/5   Abductor pollicis  5/5   Abductor pollicis  5/5   Tone (Ashworth scale)  0  Tone (Ashworth scale)  0   Right Lower Extremity:    Left Lower  Extremity:    Hip flexors  5/5   Hip flexors  5/5   Hip extensors  5/5   Hip extensors  5/5   Knee flexors  5/5   Knee flexors *improved 5/5   Knee extensors  5/5   Knee extensors *improved 5/5   Dorsiflexors  5/5   Dorsiflexors *improved 5/5   Plantarflexors  5/5   Plantarflexors  5/5   Toe extensors  5/5   Toe extensors  5/5   Toe flexors  5-/5   Toe flexors  5-/5   Tone (Ashworth scale)  0  Tone (Ashworth scale)  0   MSRs:  Right                                                                 Left brachioradialis 1+  brachioradialis 1+  biceps 1+  biceps 1+  triceps 1+  triceps 1+  patellar 2+  patellar 2+  ankle jerk 2+  ankle jerk 1+  Hoffman no  Hoffman no  plantar response down  plantar response down   COORDINATION/GAIT:   Gait narrow based and stable. Stressed gait intact.  Data: EMG 12/15/2013: 1. Chronic C6 radiculopathy affecting the left upper extremity. 2. Left ulnar neuropathy with slowing across the elbow, purely demyelinating in type. 3. Multilevel intraspinal canal lesion affecting L2-S1 myotomes bilaterally. These findings are moderate in degree electrically and worse on the left side involving the L2-L4 myotome. Active changes are also seen affecting the medial gastrocnemius muscle. These findings are insufficient for the diagnosis of motor neuron disease, recommend repeat electrodiagnostic testing in 6 months if clinically indicated.  Labs 07/14/2014:  CK 252*, vitamin D 22* Labs 11/25/2013:  CK 215, aldolase 9.2*, PTH 34, SPEP/UPEP with IFE no Mo protein, copper 116, zinc 67, CRP 0.6*, ESR 47  CT lumbar spine 12/17/2013: 1. Severe degenerative disc disease with disc height loss at L5-S1 with a mild broad-based disc osteophyte complex and right foraminal narrowing. 2. Mild broad-based disc bulge at L4-5 without foraminal or central canal stenosis.  MRI thoracic spine wo contrast 05/29/2014: Small central disc protrusions T3-4 and T7-8 without neural impingement or  significant spinal stenosis. No acute abnormality.  MRI lumbar spine wo contrast 05/29/2014:  Mild to moderate spinal stenosis at L4-5 with small central disc protrusion and diffuse disc bulging and spondylosis.  Postop laminectomy left L5-S1 without recurrent disc protrusion. There is spondylosis causing subarticular and foraminal stenosis bilaterally at L5-S1.   IMPRESSION/PLAN: [name] is a 55 year-old gentleman returning for evaluation of left leg fasciculations, weakness, and painful cramps.  His exam continues to have intact strength of the legs, which has been stable since his last visit.  Active fasciculations are present in the left gastrocnemius muscle.    Work-up to-date shows:  EMG showed chronic motor axon loss changes affecting L2-S1 myotomes bilaterally, with active changes isolated to the medial gastrocnemius muscle with mild CK elevation. Imaging only shows small disc protrusion at L4-5.   These two features with his exam initially was concerned for early onset lower motor neuron disorder, possibly progressive muscular atrophy), but the fact that his weakness has improved argues against this.  Differential includes:  Cramp-fasciculation syndrome vs progressive muscular atrophy.  If he develops weakness again, will need to consider repeating NCS/EMG.   Today, in addition to severe left cramps he also complains of tremulousness of the left leg, ?orthostatic tremor. Benzodiazepines can be used to manage both of these conditions.  He is already on baclofen 33m TID, so I will start diazepam 2.556mBID and try to keep him on the lowest dose.  Side effects including dependence was discussed.    Return to clinic in 3 months, or sooner as needed  The  duration of this appointment visit was 30 minutes of face-to-face time with the patient.  Greater than 50% of this time was spent in counseling, explanation of diagnosis, planning of further management, and coordination of care.   Thank  you for allowing me to participate in patient's care.  If I can answer any additional questions, I would be pleased to do so.    Sincerely,    Donika K. Posey Pronto, DO

## 2015-05-18 NOTE — Patient Instructions (Signed)
1.  Start valium 2.5mg  at bedtime for one week, then increase to half-tablet twice daily 2.  Continue baclofen 10mg  2 tablets three times daily 3.  Call with update in 2-3 weeks   Return to clinic in 3 months

## 2015-06-06 ENCOUNTER — Other Ambulatory Visit: Payer: Self-pay | Admitting: Cardiology

## 2015-06-07 NOTE — Telephone Encounter (Signed)
Rx request sent to pharmacy.  

## 2015-06-12 ENCOUNTER — Ambulatory Visit (INDEPENDENT_AMBULATORY_CARE_PROVIDER_SITE_OTHER): Payer: Medicare Other | Admitting: Family Medicine

## 2015-06-12 ENCOUNTER — Encounter: Payer: Self-pay | Admitting: Family Medicine

## 2015-06-12 VITALS — BP 108/80 | HR 59 | Ht 70.0 in | Wt 171.0 lb

## 2015-06-12 DIAGNOSIS — I25119 Atherosclerotic heart disease of native coronary artery with unspecified angina pectoris: Secondary | ICD-10-CM

## 2015-06-12 DIAGNOSIS — R768 Other specified abnormal immunological findings in serum: Secondary | ICD-10-CM | POA: Diagnosis not present

## 2015-06-12 DIAGNOSIS — M546 Pain in thoracic spine: Secondary | ICD-10-CM

## 2015-06-12 DIAGNOSIS — G8929 Other chronic pain: Secondary | ICD-10-CM

## 2015-06-12 NOTE — Patient Instructions (Signed)
Great to see you  I am so excited.  You are doing amazing and keep it up  Hopefully we will get another 10-15% percent better still  Do not change a thing with the medicines at this point Stay active See me again in 3 months!

## 2015-06-12 NOTE — Assessment & Plan Note (Signed)
Rheumatology did not make any changes and we'll continue to monitor.

## 2015-06-12 NOTE — Progress Notes (Signed)
Zach Smith D.O. [facility] [address] [place], [place] [address] Phone: [phone] Subjective:     CC: Back pain follow up  HPI:Subjective Andrew Joyce is a 55 y.o. male coming in with complaint of back pain. Patient is being seen by neurology for multiple different things and is had chronic irritated back pain for quite some time.  Patient was continued to have thoracic back pain. Had MRI showing small central disc protrusions of T3 and 4 as well as T7 and T8. Patient was sent to have an epidural steroid injection. Patient did not respond to the T3-T4 and did have one at the T7-T8 which also did not give him any benefit. 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 . Hx of CAD. Patient saw rheumatology and they stated that they would not change any management at this time.  Patient has been treated more for her chronic pain at this juncture. Patient was started on Effexor and we did titrate up to 75 mg. Is feeling better. States that this is the best he has felt in [place]. States daily activities has become manageable. States that if he continues to do activity it is more aggressive he has the discomfort. States though none of the severe pain that is keeping him in bed. Has been making progress overall. No significant increase in weight loss. Feels better and is optimistic about the future.    Past Medical History  Diagnosis Date  . Coronary artery disease     a. s/p CABG in 2008; b.  s/p cath in 2011 for med rx.;  c.  NSTEMI (02/2011):  LHC (02/2011):  LAD occluded, distal LAD filled via left to left collaterals, distal CFX 40-50%, proximal RCA occluded (right to right collaterals), distal RCA occluded, S-RCA occluded proximally, S-OM1/OM2 occluded (new from 2011 - culprit), L-LAD ok, ant apical HK, EF 45-50% - med Rx.  . Dyslipidemia   . Hypertension   . Recurrent UTI   . History of renal stone   . Bladder exstrophy      Congenital-  had multiple surgeries as a child  . Chronic kidney disease (CKD), stage II (mild)   . Pancreatitis   . Hx of echocardiogram     Echo (05/2013):  EF 55-60%, inf HK, mild AI, normal RVSF, RVSP 28 mmHg  . Hx of cardiovascular stress test     Nuclear (06/2009):  EF 71%, small area of infarct/ischemia in inferior wall-unchanged from prior   Past Surgical History  Procedure Laterality Date  . Cardiac catheterization  07/28/2009    EF 60%; Grafts patent except for SVG to the AM and PD. Native RCA is occluded.  . Cardiac catheterization  03/20/2006    EF 60-65%  . Coronary artery bypass graft  03/2006    LIMA GRAFT TO LAD, SAPHENOUS VEIN GRAFT TO THE ACUTE MARGINAL BRANCH THE RIGHT CORONARY, SAPHENOUS VEIN GRAFT TO THE PDA, SEQUENTIAL VEIN GRAFT TO THE FIRST AND SECOND OBTUSE MARGINAL VESSELS  . Cardiovascular stress test  06/12/2009    EF 71%, SMALL AREA OF INFARCT/ISCHEMIA IN INFERIOR WALL; FELT TO NOT BE CHANGED.  . Left heart catheterization with coronary angiogram N/A 02/26/2011    Procedure: LEFT HEART CATHETERIZATION WITH CORONARY ANGIOGRAM;  Surgeon: Philip J Nahser, MD;  Location: [facility]ATH [facility];  Service: Cardiovascular;  Laterality: N/A;   Social History  Substance Use Topics  . Smoking status: Never Smoker   . Smokeless tobacco: None  .   Alcohol Use: No   No Known Allergies Family History  Problem Relation Age of Onset  . Hyperlipidemia Mother     Living, 80  . Hypertension Mother   . Hyperlipidemia Father     Living 80  . Hypertension Father   . Healthy Sister   . Healthy Brother     History of elevated ESR and positive C-ANCA      Past medical history, social, surgical and family history all reviewed in electronic medical record.   Review of Systems: No headache, visual changes, nausea, vomiting, diarrhea, constipation, dizziness, abdominal pain, skin rash, fevers, chills, night sweats, weight loss, swollen lymph nodes, body aches, joint swelling, muscle aches, chest pain,  shortness of breath, mood changes.   Objective Blood pressure 108/80, pulse 59, height 5' 10" (1.778 m), weight 171 lb (77.565 kg), SpO2 99 %.  General: No apparent distress alert and oriented x3 mood and affect normal, dressed appropriately.  HEENT: Pupils equal, extraocular movements intact  Respiratory: Patient's speak in full sentences and does not appear short of breath  Cardiovascular: No lower extremity edema, non tender, no erythema  Skin: Warm dry intact with no signs of infection or rash on extremities or on axial skeleton.  Abdomen: Soft nontender  Neuro: Cranial nerves II through XII are intact, neurovascularly intact in all extremities with 2+ DTRs and 2+ pulses.  Lymph: No lymphadenopathy of posterior or anterior cervical chain or axillae bilaterally.  Gait normal with good balance and coordination.  MSK:  Non tender with full range of motion and good stability and symmetric strength and tone of  elbows, wrist, hip, knee and ankles bilaterally.  Continued mild scapular dysfunction  Back Exam:  Inspection: Mild decrease in lordosis Motion: Flexion 45 deg, Extension 25 deg, Side Bending to 35 deg bilaterally,  Rotation to 35 deg bilaterally  SLR laying: Negative  XSLR laying: Negative  Mild discomfort of the thoracic spine on the paraspinal musculature but improved. FABER: negative. Sensory change: Gross sensation intact to all lumbar and sacral dermatomes.  Reflexes: 2+ at both patellar tendons, 2+ at achilles tendons, Babinski's downgoing.  Strength at foot  Plantar-flexion: 5/5 Dorsi-flexion: 5/5 Eversion: 5/5 Inversion: 5/5  Leg strength  Quad: 5/5 Hamstring: 5/5 Hip flexor: 5/5 Hip abductors: 5/5  Gait unremarkable. Mild improvement   Impression and Recommendations:     This case required medical decision making of moderate complexity.     

## 2015-06-12 NOTE — Assessment & Plan Note (Signed)
Doing well with the Effexor at this time. No significant change in management. Follow-up in 3 months.

## 2015-06-12 NOTE — Progress Notes (Signed)
Pre visit review using our clinic review tool, if applicable. No additional management support is needed unless otherwise documented below in the visit note. 

## 2015-07-18 ENCOUNTER — Other Ambulatory Visit: Payer: Self-pay

## 2015-07-18 MED ORDER — LOSARTAN POTASSIUM 100 MG PO TABS: 100.0000 mg | ORAL_TABLET | Freq: Every day | ORAL | Status: DC

## 2015-07-18 MED ORDER — PRAVASTATIN SODIUM 40 MG PO TABS: 40.0000 mg | ORAL_TABLET | Freq: Every day | ORAL | Status: DC

## 2015-07-18 NOTE — Telephone Encounter (Signed)
Peter M Martinique, MD at 02/21/2015 1:08 PM  losartan (COZAAR) 100 MG tabletTake 1 tablet (100 mg total) by mouth daily pravastatin (PRAVACHOL) 40 MG tabletTake 1 tablet (40 mg total) by mouth daily 1. HTN (hypertension): Controlled.  2. Hyperlipidemia: Continue statin. Will follow up fasting lab work today.  Patient Instructions     We will check blood work for cholesterol  I will see you in 6 months    Notes Recorded by Peter M Martinique, MD on 03/02/2015 at 7:23 AM Renal function is stable. Other chemistries are OK. Triglycerides are markedly elevated. Can't really assess cholesterol when triglycerides are so high. Recommend adding fenofibrate 160 mg daily. Continue statin and fish oil. Repeat lipids and HFP in 3 months

## 2015-08-22 ENCOUNTER — Encounter: Payer: Self-pay | Admitting: Neurology

## 2015-08-22 ENCOUNTER — Ambulatory Visit (INDEPENDENT_AMBULATORY_CARE_PROVIDER_SITE_OTHER): Payer: Medicare Other | Admitting: Neurology

## 2015-08-22 VITALS — BP 130/70 | HR 84 | Ht 70.0 in | Wt 173.0 lb

## 2015-08-22 DIAGNOSIS — I25119 Atherosclerotic heart disease of native coronary artery with unspecified angina pectoris: Secondary | ICD-10-CM

## 2015-08-22 DIAGNOSIS — G709 Myoneural disorder, unspecified: Secondary | ICD-10-CM

## 2015-08-22 DIAGNOSIS — R253 Fasciculation: Secondary | ICD-10-CM

## 2015-08-22 MED ORDER — CARBAMAZEPINE 200 MG PO TABS: 200.0000 mg | ORAL_TABLET | Freq: Two times a day (BID) | ORAL | 3 refills | Status: DC

## 2015-08-22 NOTE — Progress Notes (Signed)
Follow-up Visit   Date: 08/22/15    Christen Bedoya MRN: [id number] DOB: 1960-02-26   Interim History: Jemell Town is a 55 y.o. left-handed Caucasian male with congenital exstrophy status post multiple surgeries complicated by chronic UTI, CAD s/p CABG (2008), hyperlipidemia, hypertension, and stage II CKD returning to the clinic for follow-up of cramp-fasciculation syndrome.  History of present illness: Starting in October 2015, he developed "firing nerves" and cramping of the left legs. Symptoms are worse at night and keep him from sleeping because of painful cramps. They usually last several minutes. He has no associated weakness or similar symptoms on the right leg or arms. He went to see his PCP who recommended melatonin, muscle relaxants (robaxin), ambien, and heat packs. He also tried taking valium at night which did not help much. Since starting baclofen the agitation of the leg and cramps improved.  His left leg weakness remains unchanged.  In 2016, he noticed new twitches involving his right lower leg, but no associated weakness.  He also complains of severe right mid-back pain, worse with any type of activity. MRI thoracic spine did not show any nerve impingement.     UPDATE 08/18/2014:   Muscle cramps are stable, no worsening.  His back pain is also no worse than before and he has noticed some benefith with doing the exercises as recommended by Dr. Tamala Julian.  Because his back pain remains persistent, he is planning on having steroid injection to the region to see if this helps.  He has noticed a few muscle twitches in his left triceps, but this is rare.    UPDATE 11/18/2014:  Overall, symptoms are stable.  He continues to have muscle cramps and twitches of the legs and right scapula pain which has not worsened.  No new weakness.   He has been working on strengthening his left leg and is able to push and lift the same weight on each leg now.  UPDATE 05/18/2015:  He  complains of increased painful muscle cramps affecting the left posterior calf, lasting up to 15-minutes and occuring 1-2 times daily. He always has to stop what he is doing and wait until it relaxes, but the pain can be severe.  He has not been exercising as much because of concern that it may trigger the leg to cramp.  He has seen a number of physicians for his mid thoracic pain and unfortunately has not been able to achieve any significant pain relief.    UPDATE 08/22/2015:  At his last visit, I started diazepam 2.79m BID for his cramps, but he has not noticed any significant change.  He is still having severe and painful cramps of the legs and also noticed new fasciculations over the right lower leg.  No new weakness of the extremities.  He is working with Dr. STamala Julianto get better pain relief for his scapula pain. He is still exercising daily and able to lift the same weight as previously.  Medications:  Current Outpatient Prescriptions on File Prior to Visit  Medication Sig Dispense Refill  . amLODipine (NORVASC) 5 MG tablet Take 1 tablet (5 mg total) by mouth daily. 30 tablet 6  . aspirin 81 MG tablet Take 1 tablet (81 mg total) by mouth daily.    . baclofen (LIORESAL) 10 MG tablet TAKE 2 TABLETS BY MOUTH IN THE MORNING, AND THEN 1 TAB AT LUNCHTIME, AND THEN 3 TABS AT BEDTIME 180 tablet 11  . cephALEXin (KEFLEX) 250 MG capsule  Take 250 mg by mouth at bedtime.  0  . diazepam (VALIUM) 5 MG tablet Take half-tablet at bedtime for 1 week, then increase to half-tablet twice daily 30 tablet 3  . fenofibrate 160 MG tablet Take 1 tablet (160 mg total) by mouth daily. 30 tablet 6  . fish oil-omega-3 fatty acids 1000 MG capsule Take 2 capsules (2 g total) by mouth 2 (two) times daily.    . isosorbide mononitrate (IMDUR) 60 MG 24 hr tablet TAKE ONE & ONE-HALF TABLETS BY MOUTH ONCE DAILY 45 tablet 6  . losartan (COZAAR) 100 MG tablet Take 1 tablet (100 mg total) by mouth daily. 90 tablet 1  . metoprolol  (LOPRESSOR) 50 MG tablet TAKE ONE TABLET BY MOUTH TWICE DAILY 60 tablet 5  . nitroGLYCERIN (NITROSTAT) 0.4 MG SL tablet Place 1 tablet (0.4 mg total) under the tongue every 5 (five) minutes as needed. For chest pain 25 tablet 3  . pravastatin (PRAVACHOL) 40 MG tablet Take 1 tablet (40 mg total) by mouth daily. 90 tablet 1  . traMADol (ULTRAM) 50 MG tablet Take 1 tablet (50 mg total) by mouth 3 (three) times daily as needed. 90 tablet 3  . venlafaxine XR (EFFEXOR-XR) 75 MG 24 hr capsule Take 1 capsule (75 mg total) by mouth daily with breakfast. 90 capsule 0   No current facility-administered medications on file prior to visit.     Allergies: No Known Allergies  Review of Systems:  CONSTITUTIONAL: No fevers, chills, night sweats, or weight loss.  EYES: No visual changes or eye pain ENT: No hearing changes.  No history of nose bleeds.   RESPIRATORY: No cough, wheezing and shortness of breath.   CARDIOVASCULAR: Negative for chest pain, and palpitations.   GI: Negative for abdominal discomfort, blood in stools or black stools.  No recent change in bowel habits.   GU:  No history of incontinence.   MUSCLOSKELETAL: No history of joint pain or swelling.  +cramps.   SKIN: Negative for lesions, rash, and itching.   ENDOCRINE: Negative for cold or heat intolerance, polydipsia or goiter.   PSYCH:  No depression or anxiety symptoms.   NEURO: As Above.   Vital Signs:  BP 130/70   Pulse 84   Ht _0  (1.778 m)   Wt 173 lb (78.5 kg)   SpO2 95%   BMI 24.82 kg/m   Neurological Exam: MENTAL STATUS including orientation to time, place, person, recent and remote memory, attention span and concentration, language, and fund of knowledge is normal.  Speech is not dysarthric.  CRANIAL NERVES:  Face is symmetric.   MOTOR:  Mild atrophy of the left lower leg (quads, medial gastroc). Prominent and active fasciculations of the left > right calf. Normal tone.   Right Upper Extremity:    Left Upper  Extremity:    Deltoid  5/5   Deltoid  5/5   Biceps  5/5   Biceps  5/5   Triceps  5/5   Triceps  5/5   Wrist extensors  5/5   Wrist extensors  5/5   Wrist flexors  5/5   Wrist flexors  5/5   Finger extensors  5/5   Finger extensors  5/5   Finger flexors  5/5   Finger flexors  5/5   Dorsal interossei  5/5   Dorsal interossei  5/5   Abductor pollicis  5/5   Abductor pollicis  5/5   Tone (Ashworth scale)  0  Tone (Ashworth scale)  0  Right Lower Extremity:    Left Lower Extremity:    Hip flexors  5/5   Hip flexors  5/5   Hip extensors  5/5   Hip extensors  5/5   Knee flexors  5/5   Knee flexors *improved 5/5   Knee extensors  5/5   Knee extensors *improved 5/5   Dorsiflexors  5/5   Dorsiflexors *improved 5/5   Plantarflexors  5/5   Plantarflexors  5/5   Toe extensors  5/5   Toe extensors  5/5   Toe flexors  5-/5   Toe flexors  5-/5   Tone (Ashworth scale)  0  Tone (Ashworth scale)  0   COORDINATION/GAIT:   Gait narrow based and stable.   Data: EMG 12/15/2013: 1. Chronic C6 radiculopathy affecting the left upper extremity. 2. Left ulnar neuropathy with slowing across the elbow, purely demyelinating in type. 3. Multilevel intraspinal canal lesion affecting L2-S1 myotomes bilaterally. These findings are moderate in degree electrically and worse on the left side involving the L2-L4 myotome. Active changes are also seen affecting the medial gastrocnemius muscle. These findings are insufficient for the diagnosis of motor neuron disease, recommend repeat electrodiagnostic testing in 6 months if clinically indicated.  Labs 07/14/2014:  CK 252*, vitamin D 22* Labs 11/25/2013:  CK 215, aldolase 9.2*, PTH 34, SPEP/UPEP with IFE no Mo protein, copper 116, zinc 67, CRP 0.6*, ESR 47  CT lumbar spine 12/17/2013: 1. Severe degenerative disc disease with disc height loss at L5-S1 with a mild broad-based disc osteophyte complex and right foraminal narrowing. 2. Mild broad-based disc bulge at L4-5 without  foraminal or central canal stenosis.  MRI thoracic spine wo contrast 05/29/2014: Small central disc protrusions T3-4 and T7-8 without neural impingement or significant spinal stenosis. No acute abnormality.  MRI lumbar spine wo contrast 05/29/2014:  Mild to moderate spinal stenosis at L4-5 with small central disc protrusion and diffuse disc bulging and spondylosis.  Postop laminectomy left L5-S1 without recurrent disc protrusion. There is spondylosis causing subarticular and foraminal stenosis bilaterally at L5-S1.   IMPRESSION/PLAN: Mr. Roesler is a 55 year-old gentleman returning for evaluation of left leg fasciculations and painful cramps, most suggestive of cramp-fasciculation syndrome.  His exam continues to have intact strength of the legs, which has been stable since early 2016.    Work-up to-date shows:  EMG showed chronic motor axon loss changes affecting L2-S1 myotomes bilaterally, with active changes isolated to the medial gastrocnemius muscle with mild CK elevation. Imaging only shows small disc protrusion at L4-5.   These two features with his exam initially was concerned for early onset lower motor neuron disorder, possibly progressive muscular atrophy), but the fact that his weakness has improved argues against this.  Differential includes:  Cramp-fasciculation syndrome vs progressive muscular atrophy.  If he develops weakness again, he will need repeat NCS/EMG.   For his severe leg cramps, I will start carbamazepine 274m BID since he has not noticed significant benefit with diazepam. Common side effects including rash, were discussed.  He was provided with instructions how to taper diazepam over the next 2 weeks.   Continue baclofen 236mTID.  Return to clinic in 4 months, or sooner as needed  The duration of this appointment visit was 25 minutes of face-to-face time with the patient.  Greater than 50% of this time was spent in counseling, explanation of diagnosis, planning of  further management, and coordination of care.   Thank you for allowing me to participate in patient's care.  If I can answer any additional questions, I would be pleased to do so.    Sincerely,    Swayzee Wadley K. Posey Pronto, DO

## 2015-08-22 NOTE — Patient Instructions (Addendum)
1.  Start carbamazepine 200mg  twice daily.  Stop if you develop any rash 2.  After one week, reduce diazepam to 2.5mg  at bedtime only for 7 days, then stop the medication. 3.  If you notice cramps to become worse off diazepam, it will need to be restarted  Return to clinic in 4 months

## 2015-08-24 ENCOUNTER — Ambulatory Visit: Payer: Medicare Other | Admitting: Neurology

## 2015-09-04 ENCOUNTER — Other Ambulatory Visit: Payer: Self-pay | Admitting: Family Medicine

## 2015-09-04 NOTE — Telephone Encounter (Signed)
Refill done.  

## 2015-09-12 ENCOUNTER — Ambulatory Visit: Payer: Medicare Other | Admitting: Family Medicine

## 2015-09-18 ENCOUNTER — Ambulatory Visit: Payer: Medicare Other | Admitting: Family

## 2015-09-19 ENCOUNTER — Other Ambulatory Visit: Payer: Self-pay | Admitting: Cardiology

## 2015-09-21 ENCOUNTER — Encounter: Payer: Self-pay | Admitting: Family

## 2015-09-21 ENCOUNTER — Ambulatory Visit (INDEPENDENT_AMBULATORY_CARE_PROVIDER_SITE_OTHER): Payer: Medicare Other | Admitting: Family

## 2015-09-21 VITALS — BP 128/82 | HR 62 | Temp 98.2°F | Resp 14 | Ht 70.0 in | Wt 177.8 lb

## 2015-09-21 DIAGNOSIS — I25119 Atherosclerotic heart disease of native coronary artery with unspecified angina pectoris: Secondary | ICD-10-CM

## 2015-09-21 DIAGNOSIS — N200 Calculus of kidney: Secondary | ICD-10-CM | POA: Diagnosis not present

## 2015-09-21 DIAGNOSIS — L989 Disorder of the skin and subcutaneous tissue, unspecified: Secondary | ICD-10-CM | POA: Diagnosis not present

## 2015-09-21 NOTE — Progress Notes (Signed)
Subjective:    Patient ID: Andrew Joyce, male    DOB: [birth year], 55 y.o.   MRN: [id number]  Chief Complaint  Patient presents with  . Head Laceration    has a place on his head that has been there for 3 months    HPI:  Andrew Joyce is a 55 y.o. male who  has a past medical history of Bladder exstrophy; Chronic kidney disease (CKD), stage II (mild); Coronary artery disease; Dyslipidemia; History of renal stone; cardiovascular stress test; echocardiogram; Hypertension; Pancreatitis; and Recurrent UTI. and presents today for an office visit.  This is a new problem. Associated symptom of a skin lesion located on his head and stomach has been going on for about 3 months. Lesion located on his right frontal has been having some mild tenderness. There is some bloody discharge on occasion. Denies any signficant changes in size or color. No pain on the abomoinal skin lesion with no discharge. Described the abdominal lesion started as two separate lesions and has now grown together. Modifying factors include an ointment that his wife uses for rashes that did not help very much.   No Known Allergies    Outpatient Medications Prior to Visit  Medication Sig Dispense Refill  . amLODipine (NORVASC) 5 MG tablet Take 1 tablet (5 mg total) by mouth daily. 30 tablet 6  . aspirin 81 MG tablet Take 1 tablet (81 mg total) by mouth daily.    . baclofen (LIORESAL) 10 MG tablet TAKE 2 TABLETS BY MOUTH IN THE MORNING, AND THEN 1 TAB AT LUNCHTIME, AND THEN 3 TABS AT BEDTIME 180 tablet 11  . carbamazepine (TEGRETOL) 200 MG tablet Take 1 tablet (200 mg total) by mouth 2 (two) times daily. 60 tablet 3  . cephALEXin (KEFLEX) 250 MG capsule Take 250 mg by mouth at bedtime.  0  . diazepam (VALIUM) 5 MG tablet Take half-tablet at bedtime for 1 week, then increase to half-tablet twice daily 30 tablet 3  . fenofibrate 160 MG tablet Take 1 tablet (160 mg total) by mouth daily. 30 tablet 6  . fish  oil-omega-3 fatty acids 1000 MG capsule Take 2 capsules (2 g total) by mouth 2 (two) times daily.    . isosorbide mononitrate (IMDUR) 60 MG 24 hr tablet TAKE ONE & ONE-HALF TABLETS BY MOUTH ONCE DAILY 45 tablet 6  . losartan (COZAAR) 100 MG tablet Take 1 tablet (100 mg total) by mouth daily. 90 tablet 1  . metoprolol (LOPRESSOR) 50 MG tablet TAKE ONE TABLET BY MOUTH TWICE DAILY 60 tablet 4  . nitroGLYCERIN (NITROSTAT) 0.4 MG SL tablet Place 1 tablet (0.4 mg total) under the tongue every 5 (five) minutes as needed. For chest pain 25 tablet 3  . pravastatin (PRAVACHOL) 40 MG tablet Take 1 tablet (40 mg total) by mouth daily. 90 tablet 1  . traMADol (ULTRAM) 50 MG tablet TAKE ONE TABLET BY MOUTH THREE TIMES DAILY AS NEEDED 90 tablet 1  . venlafaxine XR (EFFEXOR-XR) 75 MG 24 hr capsule Take 1 capsule (75 mg total) by mouth daily with breakfast. 90 capsule 0   No facility-administered medications prior to visit.      Review of Systems  Constitutional: Negative for chills and fever.  Respiratory: Negative for chest tightness and shortness of breath.   Skin: Positive for color change and rash. Negative for wound.      Objective:    BP 128/82 (BP Location: Left Arm, Patient Position: Sitting, Cuff Size: Large)  Pulse 62   Temp 98.2 F (36.8 C) (Oral)   Resp 14   Ht 5\' 10"  (1.778 m)   Wt 177 lb 12.8 oz (80.6 kg)   SpO2 98%   BMI 25.51 kg/m  Nursing note and vital signs reviewed.  Physical Exam  Constitutional: He is oriented to person, place, and time. He appears well-developed and well-nourished. No distress.  Cardiovascular: Normal rate, regular rhythm, normal heart sounds and intact distal pulses.   Pulmonary/Chest: Effort normal and breath sounds normal.  Neurological: He is alert and oriented to person, place, and time.  Skin: Skin is warm and dry.     Psychiatric: He has a normal mood and affect. His behavior is normal. Judgment and thought content normal.       Assessment  & Plan:   Problem List Items Addressed This Visit      Musculoskeletal and Integument   Skin lesion of chest wall    Small lesions appear cystic/sebaceous. Recommend warm compresses. No evidence of infection. Follow-up with dermatology if not improved      Relevant Orders   Ambulatory referral to Dermatology     Other   Skin lesion of scalp - Primary    Lesion of scalp concerning for possible squamous cell carcinoma and recommend biopsy with referral to dermatology placed for further assessment and treatment.      Relevant Orders   Ambulatory referral to Dermatology    Other Visit Diagnoses   None.      I am having Andrew Joyce maintain his aspirin, fish oil-omega-3 fatty acids, nitroGLYCERIN, amLODipine, cephALEXin, baclofen, isosorbide mononitrate, fenofibrate, diazepam, venlafaxine XR, losartan, pravastatin, carbamazepine, traMADol, and metoprolol.   Follow-up: Return if symptoms worsen or fail to improve.  Mauricio Po, FNP

## 2015-09-21 NOTE — Patient Instructions (Signed)
Thank you for choosing [facility].  SUMMARY AND INSTRUCTIONS:   Recommend warm compresses to stomach lesion to assist with possible drainage.   Referrals:  They will call to schedule your appointment with dermatology.   Referrals have been made during this visit. You should expect to hear back from our schedulers in about 7-10 days in regards to establishing an appointment with the specialists we discussed.   Follow up:  If your symptoms worsen or fail to improve, please contact our office for further instruction, or in case of emergency go directly to the emergency room at the closest medical facility.

## 2015-09-21 NOTE — Assessment & Plan Note (Signed)
Lesion of scalp concerning for possible squamous cell carcinoma and recommend biopsy with referral to dermatology placed for further assessment and treatment.

## 2015-09-21 NOTE — Assessment & Plan Note (Signed)
Small lesions appear cystic/sebaceous. Recommend warm compresses. No evidence of infection. Follow-up with dermatology if not improved

## 2015-09-27 NOTE — Progress Notes (Signed)
[name] [place], [place] [address] Phone: [phone] Subjective:    CC: Back pain follow up  [id number]  Andrew Joyce is a 55 y.o. male coming in with complaint of back pain. Patient is being seen by neurology for multiple different things and is had chronic irritated back pain for quite some time.  Patient was continued to have thoracic back pain. Had MRI showing small central disc protrusions of T3 and 4 as well as T7 and T8. Patient was sent to have an epidural steroid injection. Patient did not respond to the T3-T4 and did have one at the T7-T8 which also did not give him any benefit. 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 [name] Hx of CAD. Patient saw rheumatology and they stated that they would not change any management at this time.  Patient has been treated more for her chronic pain at this juncture. Patient was started on Effexor and we did titrate up to 75 mg. I Avoid increasing secondary to patient's having history of coronary artery disease. Patient is still seen neurology for chronic cramping of the legs. Was started recently on carbamazepine 200 mg twice daily. Patient is also on baclofen. Patient states He is been doing very well. Still has some mild cramping. States that the back pain is significantly better. He is able to take care of his son and help him with homework. Able to cook. Daily activities seem very good. All ligaments seems to exacerbate it as if he does significant work outside. Very happy with the results and having no side effects to the Effexor.   Past Medical History:  Diagnosis Date  . 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 Chronic kidney disease (CKD), stage II (mild)   . Coronary artery disease    a. s/p CABG in 2008; b.  s/p cath in 2011 for med rx.;  c.  NSTEMI (02/2011):  LHC (02/2011):  LAD occluded, distal LAD filled via left to left  collaterals, distal CFX 40-50%, proximal RCA occluded (right to right collaterals), distal RCA occluded, S-RCA occluded proximally, S-OM1/OM2 occluded (new from 2011 - culprit), L-LAD ok, ant apical HK, EF 45-50% - med Rx.  [name] Dyslipidemia   . History of renal stone   . Hx of cardiovascular stress test    Nuclear (06/2009):  EF 71%, small area of infarct/ischemia in inferior wall-unchanged from prior  . Hx of echocardiogram    Echo (05/2013):  EF 55-60%, inf HK, mild AI, normal RVSF, RVSP 28 mmHg  . Hypertension   . Pancreatitis   . Recurrent UTI    Past Surgical History:  Procedure Laterality Date  . CARDIAC CATHETERIZATION  07/28/2009   EF 60%; Grafts patent except for SVG to the AM and PD. Native RCA is occluded.  [name] CARDIAC CATHETERIZATION  03/20/2006   EF 60-65%  . CARDIOVASCULAR STRESS TEST  06/12/2009   EF 71%, SMALL AREA OF INFARCT/ISCHEMIA IN INFERIOR WALL; FELT TO NOT BE CHANGED.  [name] CORONARY ARTERY BYPASS GRAFT  03/2006   LIMA GRAFT TO LAD, SAPHENOUS VEIN GRAFT TO THE ACUTE MARGINAL BRANCH THE RIGHT CORONARY, SAPHENOUS VEIN GRAFT TO THE PDA, SEQUENTIAL VEIN GRAFT TO THE FIRST AND SECOND OBTUSE MARGINAL VESSELS  . LEFT HEART CATHETERIZATION WITH CORONARY ANGIOGRAM N/A 02/26/2011   Procedure: LEFT HEART CATHETERIZATION WITH CORONARY ANGIOGRAM;  Surgeon: Thayer Headings, MD;  Location: [facility]  CATH LAB;  Service: Cardiovascular;  Laterality: N/A;   Social History  Substance Use Topics  . Smoking status: Never Smoker  . Smokeless tobacco: Not on file  . Alcohol use No   No Known Allergies Family History  Problem Relation Age of Onset  . Hyperlipidemia Mother     Living, 36  . Hypertension Mother   . Hyperlipidemia Father     Living 58  . Hypertension Father   . Healthy Sister   . Healthy Brother     History of elevated ESR and positive C-ANCA      Past medical history, social, surgical and family history all reviewed in electronic medical record.   Review of Systems: No  headache, visual changes, nausea, vomiting, diarrhea, constipation, dizziness, abdominal pain, skin rash, fevers, chills, night sweats, weight loss, swollen lymph nodes, body aches, joint swelling, muscle aches, chest pain, shortness of breath, mood changes.   Objective  Blood pressure 118/72, pulse (!) 56, weight 175 lb (79.4 kg), SpO2 98 %.  General: No apparent distress alert and oriented x3 mood and affect normal, dressed appropriately.  HEENT: Pupils equal, extraocular movements intact  Respiratory: Patient's speak in full sentences and does not appear short of breath  Cardiovascular: No lower extremity edema, non tender, no erythema  Skin: Warm dry intact with no signs of infection or rash on extremities or on axial skeleton.  Abdomen: Soft nontender  Neuro: Cranial nerves II through XII are intact, neurovascularly intact in all extremities with 2+ DTRs and 2+ pulses.  Lymph: No lymphadenopathy of posterior or anterior cervical chain or axillae bilaterally.  Gait normal with good balance and coordination.  MSK:  Non tender with full range of motion and good stability and symmetric strength and tone of  elbows, wrist, hip, knee and ankles bilaterally.  Improvement in scapular dysfunction. Still some mild asymmetry noted.  Back Exam:  Inspection: Continued mild decrease in lordosis Motion: Flexion 45 deg, Extension 25 deg, Side Bending to 35 deg bilaterally,  Rotation to 35 deg bilaterally  SLR laying: Negative  XSLR laying: Negative  Mild pain in the paraspinal musculature mostly at the thoracolumbar juncture. This is much more specific than it has been previously. FABER: negative. Sensory change: Gross sensation intact to all lumbar and sacral dermatomes.  Reflexes: 2+ at both patellar tendons, 2+ at achilles tendons, Babinski's downgoing.  Strength at foot  Plantar-flexion: 5/5 Dorsi-flexion: 5/5 Eversion: 5/5 Inversion: 5/5  Leg strength  Quad: 5/5 Hamstring: 5/5 Hip flexor: 5/5  Hip abductors: 5/5  Gait unremarkable. Continued improvement overall   Impression and Recommendations:     This case required medical decision making of moderate complexity.

## 2015-09-28 ENCOUNTER — Encounter: Payer: Self-pay | Admitting: Family Medicine

## 2015-09-28 ENCOUNTER — Ambulatory Visit (INDEPENDENT_AMBULATORY_CARE_PROVIDER_SITE_OTHER): Payer: Medicare Other | Admitting: Family Medicine

## 2015-09-28 DIAGNOSIS — I25119 Atherosclerotic heart disease of native coronary artery with unspecified angina pectoris: Secondary | ICD-10-CM | POA: Diagnosis not present

## 2015-09-28 DIAGNOSIS — M899 Disorder of bone, unspecified: Secondary | ICD-10-CM | POA: Diagnosis not present

## 2015-09-28 NOTE — Patient Instructions (Addendum)
Good to see you  Ice is your friend  6 month intervals.

## 2015-09-28 NOTE — Assessment & Plan Note (Signed)
Extremely impressive patient at this time. I would not make any significant differences in treatment. Discussed with him as long as he does well he can follow-up in 6 month intervals.

## 2015-10-02 DIAGNOSIS — R300 Dysuria: Secondary | ICD-10-CM | POA: Diagnosis not present

## 2015-10-02 DIAGNOSIS — R8271 Bacteriuria: Secondary | ICD-10-CM | POA: Diagnosis not present

## 2015-10-03 ENCOUNTER — Other Ambulatory Visit: Payer: Self-pay | Admitting: Cardiology

## 2015-10-18 ENCOUNTER — Other Ambulatory Visit: Payer: Self-pay | Admitting: Family Medicine

## 2015-10-18 NOTE — Telephone Encounter (Signed)
Refill done.  

## 2015-11-01 DIAGNOSIS — D2239 Melanocytic nevi of other parts of face: Secondary | ICD-10-CM | POA: Diagnosis not present

## 2015-11-01 DIAGNOSIS — L720 Epidermal cyst: Secondary | ICD-10-CM | POA: Diagnosis not present

## 2015-11-01 DIAGNOSIS — L218 Other seborrheic dermatitis: Secondary | ICD-10-CM | POA: Diagnosis not present

## 2015-11-01 DIAGNOSIS — D1801 Hemangioma of skin and subcutaneous tissue: Secondary | ICD-10-CM | POA: Diagnosis not present

## 2015-11-01 DIAGNOSIS — C44319 Basal cell carcinoma of skin of other parts of face: Secondary | ICD-10-CM | POA: Diagnosis not present

## 2015-11-01 DIAGNOSIS — L814 Other melanin hyperpigmentation: Secondary | ICD-10-CM | POA: Diagnosis not present

## 2015-11-01 DIAGNOSIS — D225 Melanocytic nevi of trunk: Secondary | ICD-10-CM | POA: Diagnosis not present

## 2015-11-08 ENCOUNTER — Ambulatory Visit (INDEPENDENT_AMBULATORY_CARE_PROVIDER_SITE_OTHER): Payer: Medicare Other | Admitting: Family Medicine

## 2015-11-08 ENCOUNTER — Encounter: Payer: Self-pay | Admitting: Family Medicine

## 2015-11-08 ENCOUNTER — Ambulatory Visit (INDEPENDENT_AMBULATORY_CARE_PROVIDER_SITE_OTHER)
Admission: RE | Admit: 2015-11-08 | Discharge: 2015-11-08 | Disposition: A | Discharge status: Home / Self Care | Payer: Medicare Other | Source: Ambulatory Visit | Attending: Family Medicine | Admitting: Family Medicine

## 2015-11-08 VITALS — BP 140/80 | HR 62 | Ht 70.0 in | Wt 177.0 lb

## 2015-11-08 DIAGNOSIS — M899 Disorder of bone, unspecified: Secondary | ICD-10-CM

## 2015-11-08 DIAGNOSIS — I25119 Atherosclerotic heart disease of native coronary artery with unspecified angina pectoris: Secondary | ICD-10-CM | POA: Diagnosis not present

## 2015-11-08 DIAGNOSIS — C4490 Unspecified malignant neoplasm of skin, unspecified: Secondary | ICD-10-CM

## 2015-11-08 DIAGNOSIS — M546 Pain in thoracic spine: Secondary | ICD-10-CM

## 2015-11-08 DIAGNOSIS — R293 Abnormal posture: Secondary | ICD-10-CM | POA: Diagnosis not present

## 2015-11-08 DIAGNOSIS — G8929 Other chronic pain: Secondary | ICD-10-CM

## 2015-11-08 DIAGNOSIS — L989 Disorder of the skin and subcutaneous tissue, unspecified: Secondary | ICD-10-CM

## 2015-11-08 MED ORDER — TRAMADOL HCL 50 MG PO TABS: 50.0000 mg | ORAL_TABLET | Freq: Three times a day (TID) | ORAL | 5 refills | Status: DC | PRN

## 2015-11-08 NOTE — Assessment & Plan Note (Signed)
Encourage ergonomics and postural control. Discussed different lifting mechanics

## 2015-11-08 NOTE — Progress Notes (Signed)
[facility] [place], [place] [address] Phone: [phone] Subjective:    CC: Back pain follow up  [id number]  Andrew Joyce is a 55 y.o. male coming in with complaint of back pain. Patient is being seen by neurology for multiple different things and is had chronic irritated back pain for quite some time.  Patient was continued to have thoracic back pain. Had MRI showing small central disc protrusions of T3 and 4 as well as T7 and T8. Patient was sent to have an epidural steroid injection. Patient did not respond to the T3-T4 and did have one at the T7-T8 which also did not give him any benefit. 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 [name] Hx of CAD. Patient saw rheumatology and they stated that they would not change any management at this time.  Patient has been treated more for her chronic pain at this juncture. Patient was started on Effexor and we did titrate up to 75 mg. Still doing well with no significant side effects. . Patient states that he is doing more and more activities with his son which is very beneficial. Feels like he has been a much more active father. Very happy with the results. Taking the tramadol in addition to the Effexor has helped significant. He is taking in 2-3 times daily fairly regularly. Happy with the results so far.  Patient recently was diagnosed with basal cell carcinoma on his  head. We'll have Mohs surgery next week. Patient was told that he needs a chest x-ray.   Past Medical History:  Diagnosis Date  . 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 Chronic kidney disease (CKD), stage II (mild)   . Coronary artery disease    a. s/p CABG in 2008; b.  s/p cath in 2011 for med rx.;  c.  NSTEMI (02/2011):  LHC (02/2011):  LAD occluded, distal LAD filled via left to left collaterals, distal CFX 40-50%, proximal RCA occluded (right to right collaterals),  distal RCA occluded, S-RCA occluded proximally, S-OM1/OM2 occluded (new from 2011 - culprit), L-LAD ok, ant apical HK, EF 45-50% - med Rx.  [name] Dyslipidemia   . History of renal stone   . Hx of cardiovascular stress test    Nuclear (06/2009):  EF 71%, small area of infarct/ischemia in inferior wall-unchanged from prior  . Hx of echocardiogram    Echo (05/2013):  EF 55-60%, inf HK, mild AI, normal RVSF, RVSP 28 mmHg  . Hypertension   . Pancreatitis   . Recurrent UTI    Past Surgical History:  Procedure Laterality Date  . CARDIAC CATHETERIZATION  07/28/2009   EF 60%; Grafts patent except for SVG to the AM and PD. Native RCA is occluded.  [name] CARDIAC CATHETERIZATION  03/20/2006   EF 60-65%  . CARDIOVASCULAR STRESS TEST  06/12/2009   EF 71%, SMALL AREA OF INFARCT/ISCHEMIA IN INFERIOR WALL; FELT TO NOT BE CHANGED.  [name] CORONARY ARTERY BYPASS GRAFT  03/2006   LIMA GRAFT TO LAD, SAPHENOUS VEIN GRAFT TO THE ACUTE MARGINAL BRANCH THE RIGHT CORONARY, SAPHENOUS VEIN GRAFT TO THE PDA, SEQUENTIAL VEIN GRAFT TO THE FIRST AND SECOND OBTUSE MARGINAL VESSELS  . LEFT HEART CATHETERIZATION WITH CORONARY ANGIOGRAM N/A 02/26/2011   Procedure: LEFT HEART CATHETERIZATION WITH CORONARY ANGIOGRAM;  Surgeon: Thayer Headings, MD;  Location: [facility] CATH LAB;  Service: Cardiovascular;  Laterality: N/A;   Social  History  Substance Use Topics  . Smoking status: Never Smoker  . Smokeless tobacco: Not on file  . Alcohol use No   No Known Allergies Family History  Problem Relation Age of Onset  . Hyperlipidemia Mother     Living, 44  . Hypertension Mother   . Hyperlipidemia Father     Living 98  . Hypertension Father   . Healthy Sister   . Healthy Brother     History of elevated ESR and positive C-ANCA      Past medical history, social, surgical and family history all reviewed in electronic medical record.   Review of Systems: No headache, visual changes, nausea, vomiting, diarrhea, constipation, dizziness, abdominal  pain, skin rash, fevers, chills, night sweats, weight loss, swollen lymph nodes, body aches, joint swelling, muscle aches, chest pain, shortness of breath, mood changes.   Objective  Blood pressure 140/80, pulse 62, height 5' 10"  (1.778 m), weight 177 lb (80.3 kg).  Systems examined below as of 11/08/15 General: No apparent distress alert and oriented x3 mood and affect normal, dressed appropriately.  HEENT: Pupils equal, extraocular movements intact  Respiratory: Patient's speak in full sentences and does not appear short of breath  Cardiovascular: No lower extremity edema, non tender, no erythema  Skin: Warm dry intact with no signs of infection or rash on extremities or on axial skeleton.  Abdomen: Soft nontender  Neuro: Cranial nerves II through XII are intact, neurovascularly intact in all extremities with 2+ DTRs and 2+ pulses.  Lymph: No lymphadenopathy of posterior or anterior cervical chain or axillae bilaterally.  Gait normal with good balance and coordination.  MSK:  Non tender with full range of motion and good stability and symmetric strength and tone of  elbows, wrist, hip, knee and ankles bilaterally.  No change in physical exam  Back Exam:  Inspection: Continued mild decrease in lordosis Motion: Flexion 45 deg, Extension 25 deg, Side Bending to 35 deg bilaterally,  Rotation to 35 deg bilaterally  SLR laying: Negative  XSLR laying: Negative  Minimal tenderness in the thoracal lumbar juncture. FABER: negative. Sensory change: Gross sensation intact to all lumbar and sacral dermatomes.  Reflexes: 2+ at both patellar tendons, 2+ at achilles tendons, Babinski's downgoing.  Strength at foot  Plantar-flexion: 5/5 Dorsi-flexion: 5/5 Eversion: 5/5 Inversion: 5/5  Leg strength  Quad: 5/5 Hamstring: 5/5 Hip flexor: 5/5 Hip abductors: 5/5  Gait unremarkable. Continued improvement overall   Impression and Recommendations:     This case required medical decision making of  moderate complexity.

## 2015-11-08 NOTE — Assessment & Plan Note (Signed)
Continue same exercises.Significant change.

## 2015-11-08 NOTE — Assessment & Plan Note (Signed)
Happy with the results. Refilled patient's tramadol at this time. Likely still does have an underlying autoimmune disease but seems to be well controlled. Follow-up 6 months.

## 2015-11-08 NOTE — Assessment & Plan Note (Signed)
Was basal cell. Chest x-ray ordered today to further evaluate make sure there is no mass.

## 2015-11-08 NOTE — Patient Instructions (Signed)
Great to see you  Keep it up I am proud of you We will get the chest xray and I will release it in my chart  See me again in 6 months!

## 2015-11-15 DIAGNOSIS — N3000 Acute cystitis without hematuria: Secondary | ICD-10-CM | POA: Diagnosis not present

## 2015-11-15 DIAGNOSIS — Q640 Epispadias: Secondary | ICD-10-CM | POA: Diagnosis not present

## 2015-11-15 DIAGNOSIS — R3121 Asymptomatic microscopic hematuria: Secondary | ICD-10-CM | POA: Diagnosis not present

## 2015-11-15 DIAGNOSIS — N200 Calculus of kidney: Secondary | ICD-10-CM | POA: Diagnosis not present

## 2015-11-16 DIAGNOSIS — Z85828 Personal history of other malignant neoplasm of skin: Secondary | ICD-10-CM | POA: Diagnosis not present

## 2015-11-16 DIAGNOSIS — C44319 Basal cell carcinoma of skin of other parts of face: Secondary | ICD-10-CM | POA: Diagnosis not present

## 2015-12-05 DIAGNOSIS — N200 Calculus of kidney: Secondary | ICD-10-CM | POA: Diagnosis not present

## 2015-12-12 DIAGNOSIS — B9620 Unspecified Escherichia coli [E. coli] as the cause of diseases classified elsewhere: Secondary | ICD-10-CM | POA: Diagnosis not present

## 2015-12-12 DIAGNOSIS — N390 Urinary tract infection, site not specified: Secondary | ICD-10-CM | POA: Diagnosis not present

## 2015-12-13 ENCOUNTER — Other Ambulatory Visit: Payer: Self-pay | Admitting: Cardiology

## 2015-12-13 NOTE — Telephone Encounter (Signed)
Rx(s) sent to pharmacy electronically.  

## 2015-12-16 NOTE — Progress Notes (Signed)
Cardiology Office Note   Date:  12/18/2015   ID:  Andrew Joyce, DOB 10/10/1960, MRN [id number]  PCP:  Mauricio Po, FNP  Cardiologist:  Dr. Peter Martinique      History of Present Illness: Andrew Joyce is a 55 y.o. male seen for follow up CAD. He has a hx of CAD s/p CABG in 2008, HTN, HL, CKD.  He suffered a non-STEMI in February 2013. Culprit was felt to be an occluded SVG-OM1/OM2. Native circumflex had 40-50% distal stenosis. There were no targets for revascularization and he was treated medically.   He has a history orthostatic intolerance. Imdur dose was reduced.  Echo May 2015 demonstrated EF 55-60%, mild inferior hypokinesis, top normal LA/RA size.Poorly visualized aortic valve, however, there is mild central AI - no stenosis.    On follow up today he is doing well from a cardiac standpoint. He has no chest pain or SOB. He stays active. He does have recurrent UTIs followed by urology. He has severe hypertriglyceridemia. Was started on fenofibrate but no recent labs.    Recent Labs: 03/01/2015: ALT 26; Creat 1.38; Direct LDL 45; HDL 28; LDL Cholesterol NOT CALC; Potassium 4.6  Wt Readings from Last 3 Encounters:  12/18/15 172 lb 3.2 oz (78.1 kg)  11/08/15 177 lb (80.3 kg)  09/28/15 175 lb (79.4 kg)     Past Medical History:  Diagnosis Date  . 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 Chronic kidney disease (CKD), stage II (mild)   . Coronary artery disease    a. s/p CABG in 2008; b.  s/p cath in 2011 for med rx.;  c.  NSTEMI (02/2011):  LHC (02/2011):  LAD occluded, distal LAD filled via left to left collaterals, distal CFX 40-50%, proximal RCA occluded (right to right collaterals), distal RCA occluded, S-RCA occluded proximally, S-OM1/OM2 occluded (new from 2011 - culprit), L-LAD ok, ant apical HK, EF 45-50% - med Rx.  [name] Dyslipidemia   . History of renal stone   . Hx of cardiovascular stress test    Nuclear (06/2009):  EF 71%,  small area of infarct/ischemia in inferior wall-unchanged from prior  . Hx of echocardiogram    Echo (05/2013):  EF 55-60%, inf HK, mild AI, normal RVSF, RVSP 28 mmHg  . Hypertension   . Pancreatitis   . Recurrent UTI     Current Outpatient Prescriptions  Medication Sig Dispense Refill  . amLODipine (NORVASC) 5 MG tablet Take 1 tablet (5 mg total) by mouth daily. 30 tablet 6  . aspirin 81 MG tablet Take 1 tablet (81 mg total) by mouth daily.    . baclofen (LIORESAL) 10 MG tablet TAKE TWO TABLETS BY MOUTH IN THE MORNING, AND THEN TAKE ONE TABLET BY MOUTH AT LUNCHTIME, AND THEN TAKE THREE TABLETS BY MOUTH AT BEDTIME 180 tablet 11  . carbamazepine (TEGRETOL) 200 MG tablet Take 1 tablet (200 mg total) by mouth 2 (two) times daily. 60 tablet 3  . cephALEXin (KEFLEX) 250 MG capsule Take 250 mg by mouth at bedtime.  0  . diazepam (VALIUM) 5 MG tablet Take half-tablet at bedtime for 1 week, then increase to half-tablet twice daily 30 tablet 3  . fenofibrate 160 MG tablet TAKE ONE TABLET BY MOUTH ONCE DAILY 30 tablet 5  . fish oil-omega-3 fatty acids 1000 MG capsule Take 2 capsules (2 g total) by mouth 2 (two) times daily.    . isosorbide mononitrate (IMDUR) 60  MG 24 hr tablet Take 1.5 tablets (90 mg total) by mouth daily. 135 tablet 0  . losartan (COZAAR) 100 MG tablet Take 1 tablet (100 mg total) by mouth daily. 90 tablet 1  . metoprolol (LOPRESSOR) 50 MG tablet TAKE ONE TABLET BY MOUTH TWICE DAILY 60 tablet 4  . nitroGLYCERIN (NITROSTAT) 0.4 MG SL tablet Place 1 tablet (0.4 mg total) under the tongue every 5 (five) minutes as needed. For chest pain 25 tablet 3  . pravastatin (PRAVACHOL) 40 MG tablet Take 1 tablet (40 mg total) by mouth daily. 90 tablet 1  . traMADol (ULTRAM) 50 MG tablet Take 1 tablet (50 mg total) by mouth 3 (three) times daily as needed. 90 tablet 5  . venlafaxine XR (EFFEXOR-XR) 75 MG 24 hr capsule Take 1 capsule (75 mg total) by mouth daily with breakfast. 90 capsule 0  .  venlafaxine XR (EFFEXOR-XR) 75 MG 24 hr capsule TAKE ONE CAPSULE BY MOUTH ONCE DAILY WITH BREAKFAST 90 capsule 0   No current facility-administered medications for this visit.     Allergies:   Patient has no known allergies.   Social History:  The patient  reports that he has never smoked. He does not have any smokeless tobacco history on file. He reports that he does not drink alcohol or use drugs.   Family History:  The patient's family history includes Healthy in his brother and sister; Hyperlipidemia in his father and mother; Hypertension in his father and mother.   ROS:  Please see the history of present illness.     All other systems reviewed and negative.   PHYSICAL EXAM: VS:  BP 128/84   Pulse 72   Ht 5\' 10"  (1.778 m)   Wt 172 lb 3.2 oz (78.1 kg)   BMI 24.71 kg/m   Well nourished, well developed, in no acute distress  HEENT: normal  Neck: no JVD  Cardiac:  normal S1, S2; RRR; no murmur  Lungs:  clear to auscultation bilaterally, no wheezing, rhonchi or rales  Abd: soft, nontender, no hepatomegaly  Ext: no edema  Skin: warm and dry  Neuro:  CNs 2-12 intact, no focal abnormalities noted  Laboratory data:  Lab Results  Component Value Date   WBC 5.4 06/01/2013   HGB 13.8 06/01/2013   HCT 40.1 06/01/2013   PLT 196.0 06/01/2013   GLUCOSE 132 (H) 03/01/2015   CHOL 234 (H) 03/01/2015   TRIG 1,121 (H) 03/01/2015   HDL 28 (L) 03/01/2015   LDLDIRECT 45 03/01/2015   LDLCALC NOT CALC 03/01/2015   ALT 26 03/01/2015   AST 22 03/01/2015   NA 139 03/01/2015   K 4.6 03/01/2015   CL 100 03/01/2015   CREATININE 1.38 (H) 03/01/2015   BUN 32 (H) 03/01/2015   CO2 26 03/01/2015   TSH 3.44 06/01/2013   INR 1.03 02/26/2011   HGBA1C 5.9 06/02/2013   Ecg today shows NSR with normal Ecg. I have personally reviewed and interpreted this study.   ASSESSMENT AND PLAN:  1. Orthostatic intolerance: he is now asymptomatic. 2. CAD (coronary artery disease): s/p remote CABG in 2008.  Occluded SVG to OM1 and OM2 in 2013.  Continue aspirin, Plavix, nitrates, beta blocker. Asymptomatic.  3. HTN (hypertension):  Controlled.  4. Hyperlipidemia- combined with severe hypertriglyceridemia. :   On pravastatin, fenofibrate, fish oil. Will follow up fasting lab work. 5. CKD (chronic kidney disease) stage 2, GFR 60-89 ml/min:  Repeat chemistries  I will follow up in 6 months.

## 2015-12-17 ENCOUNTER — Other Ambulatory Visit: Payer: Self-pay | Admitting: Neurology

## 2015-12-18 ENCOUNTER — Encounter: Payer: Self-pay | Admitting: Cardiology

## 2015-12-18 ENCOUNTER — Ambulatory Visit (INDEPENDENT_AMBULATORY_CARE_PROVIDER_SITE_OTHER): Payer: Medicare Other | Admitting: Cardiology

## 2015-12-18 VITALS — BP 128/84 | HR 72 | Ht 70.0 in | Wt 172.2 lb

## 2015-12-18 DIAGNOSIS — I25119 Atherosclerotic heart disease of native coronary artery with unspecified angina pectoris: Secondary | ICD-10-CM | POA: Diagnosis not present

## 2015-12-18 DIAGNOSIS — E781 Pure hyperglyceridemia: Secondary | ICD-10-CM

## 2015-12-18 DIAGNOSIS — I209 Angina pectoris, unspecified: Secondary | ICD-10-CM

## 2015-12-18 DIAGNOSIS — E7800 Pure hypercholesterolemia, unspecified: Secondary | ICD-10-CM

## 2015-12-18 DIAGNOSIS — I10 Essential (primary) hypertension: Secondary | ICD-10-CM | POA: Diagnosis not present

## 2015-12-18 NOTE — Patient Instructions (Signed)
We will schedule you for fasting blood work  I will see you in 6 months.

## 2015-12-20 DIAGNOSIS — N200 Calculus of kidney: Secondary | ICD-10-CM | POA: Diagnosis not present

## 2015-12-29 ENCOUNTER — Encounter: Payer: Self-pay | Admitting: Neurology

## 2015-12-29 ENCOUNTER — Other Ambulatory Visit (INDEPENDENT_AMBULATORY_CARE_PROVIDER_SITE_OTHER): Payer: Medicare Other

## 2015-12-29 ENCOUNTER — Ambulatory Visit (INDEPENDENT_AMBULATORY_CARE_PROVIDER_SITE_OTHER): Payer: Medicare Other | Admitting: Neurology

## 2015-12-29 VITALS — BP 104/70 | HR 74 | Ht 70.0 in | Wt 174.0 lb

## 2015-12-29 DIAGNOSIS — R253 Fasciculation: Secondary | ICD-10-CM

## 2015-12-29 DIAGNOSIS — I25119 Atherosclerotic heart disease of native coronary artery with unspecified angina pectoris: Secondary | ICD-10-CM | POA: Diagnosis not present

## 2015-12-29 DIAGNOSIS — G2581 Restless legs syndrome: Secondary | ICD-10-CM | POA: Diagnosis not present

## 2015-12-29 DIAGNOSIS — G709 Myoneural disorder, unspecified: Secondary | ICD-10-CM

## 2015-12-29 LAB — FERRITIN: FERRITIN: 180 ng/mL (ref 22.0–322.0)

## 2015-12-29 MED ORDER — CARBIDOPA-LEVODOPA 25-100 MG PO TABS: 1.0000 | ORAL_TABLET | Freq: Every evening | ORAL | 3 refills | Status: DC | PRN

## 2015-12-29 MED ORDER — DIAZEPAM 5 MG PO TABS: 2.5000 mg | ORAL_TABLET | Freq: Every day | ORAL | 3 refills | Status: DC

## 2015-12-29 MED ORDER — TRAMADOL HCL 50 MG PO TABS: 50.0000 mg | ORAL_TABLET | Freq: Every evening | ORAL | 3 refills | Status: DC | PRN

## 2015-12-29 NOTE — Patient Instructions (Addendum)
1.  Check ferritin 2.  Start sinemet 25/100mg  at onset of left leg restlessness 3.  Continue your medications as you are taking them  Return to clinic in 6 months

## 2015-12-29 NOTE — Progress Notes (Signed)
Follow-up Visit   Date: 12/29/15    Andrew Joyce MRN: [id number] DOB: 1960/03/07   Interim History: Andrew Joyce is a 55 y.o. left-handed Caucasian male with congenital exstrophy status post multiple surgeries complicated by chronic UTI, CAD s/p CABG (2008), hyperlipidemia, hypertension, and stage II CKD returning to the clinic for follow-up of cramp-fasciculation syndrome.  History of present illness: Starting in October 2015, he developed "firing nerves" and cramping of the left legs. Symptoms are worse at night and keep him from sleeping because of painful cramps. They usually last several minutes. He has no associated weakness or similar symptoms on the right leg or arms. He went to see his PCP who recommended melatonin, muscle relaxants (robaxin), ambien, and heat packs. He also tried taking valium at night which did not help much. Since starting baclofen the agitation of the leg and cramps improved.  His left leg weakness remains unchanged.  In 2016, he noticed new twitches involving his right lower leg, but no associated weakness.  He also complains of severe right mid-back pain, worse with any type of activity. MRI thoracic spine did not show any nerve impingement.   He was referred to Dr. Tamala Julian who recommended exercises and steroid injection, but there was no lasting benefit.    UPDATE 05/18/2015:  He complains of increased painful muscle cramps affecting the left posterior calf, lasting up to 15-minutes and occuring 1-2 times daily. He always has to stop what he is doing and wait until it relaxes, but the pain can be severe.  He has not been exercising as much because of concern that it may trigger the leg to cramp.  He has seen a number of physicians for his mid thoracic pain and unfortunately has not been able to achieve any significant pain relief.    UPDATE 08/22/2015:  At his last visit, I started diazepam 2.68m BID for his cramps, but he has not noticed any  significant change.  He is still having severe and painful cramps of the legs and also noticed new fasciculations over the right lower leg.  No new weakness of the extremities.  He is working with Dr. STamala Julianto get better pain relief for his scapula pain. He is still exercising daily and able to lift the same weight as previously.  UPDATE 12/29/2015:  Starting around the fall of 2017, he began having spells of restlessness of the of the left leg exclusively when he is laying in bed.  He often has to get up and walk to relieve the discomfort.  Symptoms occur about 3-4 times per month and last a few days.  His cramps are better controlled on a combination of baclofen 27mTID, carbamazepine 20086mID, diazepam 2.5mg60ms, and tramadol 50mg56m.  He denies any new weakness.  Notable interval history also includes a recent diagnosis of basal cell carcinoma of the right forehead, which has been resection.    Medications:  Current Outpatient Prescriptions on File Prior to Visit  Medication Sig Dispense Refill  . amLODipine (NORVASC) 5 MG tablet Take 1 tablet (5 mg total) by mouth daily. 30 tablet 6  . aspirin 81 MG tablet Take 1 tablet (81 mg total) by mouth daily.    . baclofen (LIORESAL) 10 MG tablet TAKE TWO TABLETS BY MOUTH IN THE MORNING, AND THEN TAKE ONE TABLET BY MOUTH AT LUNCHTIME, AND THEN TAKE THREE TABLETS BY MOUTH AT BEDTIME 180 tablet 11  . carbamazepine (TEGRETOL) 200 MG tablet Take  1 tablet (200 mg total) by mouth 2 (two) times daily. 60 tablet 3  . cephALEXin (KEFLEX) 250 MG capsule Take 250 mg by mouth at bedtime.  0  . diazepam (VALIUM) 5 MG tablet Take half-tablet at bedtime for 1 week, then increase to half-tablet twice daily 30 tablet 3  . fenofibrate 160 MG tablet TAKE ONE TABLET BY MOUTH ONCE DAILY 30 tablet 5  . fish oil-omega-3 fatty acids 1000 MG capsule Take 2 capsules (2 g total) by mouth 2 (two) times daily.    . isosorbide mononitrate (IMDUR) 60 MG 24 hr tablet Take 1.5  tablets (90 mg total) by mouth daily. 135 tablet 0  . losartan (COZAAR) 100 MG tablet Take 1 tablet (100 mg total) by mouth daily. 90 tablet 1  . metoprolol (LOPRESSOR) 50 MG tablet TAKE ONE TABLET BY MOUTH TWICE DAILY 60 tablet 4  . nitroGLYCERIN (NITROSTAT) 0.4 MG SL tablet Place 1 tablet (0.4 mg total) under the tongue every 5 (five) minutes as needed. For chest pain 25 tablet 3  . pravastatin (PRAVACHOL) 40 MG tablet Take 1 tablet (40 mg total) by mouth daily. 90 tablet 1  . traMADol (ULTRAM) 50 MG tablet Take 1 tablet (50 mg total) by mouth 3 (three) times daily as needed. 90 tablet 5  . venlafaxine XR (EFFEXOR-XR) 75 MG 24 hr capsule Take 1 capsule (75 mg total) by mouth daily with breakfast. 90 capsule 0  . venlafaxine XR (EFFEXOR-XR) 75 MG 24 hr capsule TAKE ONE CAPSULE BY MOUTH ONCE DAILY WITH BREAKFAST 90 capsule 0   No current facility-administered medications on file prior to visit.     Allergies: No Known Allergies  Review of Systems:  CONSTITUTIONAL: No fevers, chills, night sweats, or weight loss.  EYES: No visual changes or eye pain ENT: No hearing changes.  No history of nose bleeds.   RESPIRATORY: No cough, wheezing and shortness of breath.   CARDIOVASCULAR: Negative for chest pain, and palpitations.   GI: Negative for abdominal discomfort, blood in stools or black stools.  No recent change in bowel habits.   GU:  No history of incontinence.   MUSCLOSKELETAL: No history of joint pain or swelling.  +cramps.   SKIN: Negative for lesions, rash, and itching.   ENDOCRINE: Negative for cold or heat intolerance, polydipsia or goiter.   PSYCH:  No depression or anxiety symptoms.   NEURO: As Above.   Vital Signs:  BP 104/70   Pulse 74   Ht 5' 10"  (1.778 m)   Wt 174 lb (78.9 kg)   SpO2 98%   BMI 24.97 kg/m   Neurological Exam: MENTAL STATUS including orientation to time, place, person, recent and remote memory, attention span and concentration, language, and fund of  knowledge is normal.  Speech is not dysarthric.  CRANIAL NERVES:  Face is symmetric.   MOTOR:  Mild atrophy of the left lower leg (quads, medial gastroc). Prominent and active fasciculations of the left > right calf. Normal tone.   Right Upper Extremity:    Left Upper Extremity:    Deltoid  5/5   Deltoid  5/5   Biceps  5/5   Biceps  5/5   Triceps  5/5   Triceps  5/5   Wrist extensors  5/5   Wrist extensors  5/5   Wrist flexors  5/5   Wrist flexors  5/5   Finger extensors  5/5   Finger extensors  5/5   Finger flexors  5/5  Finger flexors  5/5   Dorsal interossei  5/5   Dorsal interossei  5/5   Abductor pollicis  5/5   Abductor pollicis  5/5   Tone (Ashworth scale)  0  Tone (Ashworth scale)  0   Right Lower Extremity:    Left Lower Extremity:    Hip flexors  5/5   Hip flexors  5/5   Hip extensors  5/5   Hip extensors  5/5   Knee flexors  5/5   Knee flexors *improved 5/5   Knee extensors  5/5   Knee extensors *improved 5/5   Dorsiflexors  5/5   Dorsiflexors *improved 5/5   Plantarflexors  5/5   Plantarflexors  5/5   Toe extensors  5/5   Toe extensors  5/5   Toe flexors  5-/5   Toe flexors  5-/5   Tone (Ashworth scale)  0  Tone (Ashworth scale)  0   MSRs:  Right                                                                 Left brachioradialis 1+  brachioradialis 1+  biceps 1+  biceps 1+  triceps 1+  triceps 1+  patellar 2+  patellar 2+  ankle jerk 1+  ankle jerk 1+   COORDINATION/GAIT:   Gait narrow based and stable.   Data: EMG 12/15/2013: 1. Chronic C6 radiculopathy affecting the left upper extremity. 2. Left ulnar neuropathy with slowing across the elbow, purely demyelinating in type. 3. Multilevel intraspinal canal lesion affecting L2-S1 myotomes bilaterally. These findings are moderate in degree electrically and worse on the left side involving the L2-L4 myotome. Active changes are also seen affecting the medial gastrocnemius muscle. These findings are insufficient  for the diagnosis of motor neuron disease, recommend repeat electrodiagnostic testing in 6 months if clinically indicated.  Labs 07/14/2014:  CK 252*, vitamin D 22* Labs 11/25/2013:  CK 215, aldolase 9.2*, PTH 34, SPEP/UPEP with IFE no Mo protein, copper 116, zinc 67, CRP 0.6*, ESR 47  CT lumbar spine 12/17/2013: 1. Severe degenerative disc disease with disc height loss at L5-S1 with a mild broad-based disc osteophyte complex and right foraminal narrowing. 2. Mild broad-based disc bulge at L4-5 without foraminal or central canal stenosis.  MRI thoracic spine wo contrast 05/29/2014: Small central disc protrusions T3-4 and T7-8 without neural impingement or significant spinal stenosis. No acute abnormality.  MRI lumbar spine wo contrast 05/29/2014:  Mild to moderate spinal stenosis at L4-5 with small central disc protrusion and diffuse disc bulging and spondylosis.  Postop laminectomy left L5-S1 without recurrent disc protrusion. There is spondylosis causing subarticular and foraminal stenosis bilaterally at L5-S1.   IMPRESSION/PLAN: 1.  Cramp-fasciculation syndrome  - He has active left leg fasciculations and painful cramp, no weakness on exam  - Work-up to-date shows:  EMG showed chronic motor axon loss changes affecting L2-S1 myotomes bilaterally, with active changes isolated to the medial gastrocnemius muscle with mild CK elevation. Imaging only shows small disc protrusion at L4-5.   These two features with his exam initially was concerned for early onset lower motor neuron disorder, possibly progressive muscular atrophy), but the fact that his weakness has improved argues against this.  Differential includes:  Cramp-fasciculation syndrome vs progressive muscular atrophy.  If he develops weakness again, he will need repeat NCS/EMG.   - For cramps, continue carbamazepine 255m BID, diazepam 2.517mat bedtime, and baclofen 2080mID  - For severe pain, okay to take tramadolol 61m42m bedtime  prn  2.  Restless leg syndrome - new  - Check ferritin  - Since symptoms are very sporadic, occurring only 2-4 times per month, will opt to treat as needed with sinemet 25/100.  Side effects discussed  Return to clinic in 6 months, or sooner as needed  The duration of this appointment visit was 30 minutes of face-to-face time with the patient.  Greater than 50% of this time was spent in counseling, explanation of diagnosis, planning of further management, and coordination of care.   Thank you for allowing me to participate in patient's care.  If I can answer any additional questions, I would be pleased to do so.    Sincerely,    Donika K. PatePosey Pronto

## 2016-01-02 ENCOUNTER — Other Ambulatory Visit: Payer: Self-pay | Admitting: *Deleted

## 2016-01-02 ENCOUNTER — Telehealth: Payer: Self-pay | Admitting: Neurology

## 2016-01-02 MED ORDER — DIAZEPAM 5 MG PO TABS: 2.5000 mg | ORAL_TABLET | Freq: Every day | ORAL | 3 refills | Status: DC

## 2016-01-02 MED ORDER — TRAMADOL HCL 50 MG PO TABS: 50.0000 mg | ORAL_TABLET | Freq: Every evening | ORAL | 3 refills | Status: DC | PRN

## 2016-01-02 NOTE — Telephone Encounter (Signed)
Pt called to let us know his valium and tramadol was not refilled.  Please call.

## 2016-01-02 NOTE — Telephone Encounter (Signed)
Rx was sent to wrong pharmacy.  Sent to correct pharmacy today.

## 2016-01-16 ENCOUNTER — Other Ambulatory Visit: Payer: Self-pay | Admitting: Family Medicine

## 2016-01-16 NOTE — Telephone Encounter (Signed)
Refill done.  

## 2016-02-21 ENCOUNTER — Other Ambulatory Visit: Payer: Self-pay | Admitting: Cardiology

## 2016-02-21 NOTE — Telephone Encounter (Signed)
Rx(s) sent to pharmacy electronically.  

## 2016-02-23 DIAGNOSIS — N200 Calculus of kidney: Secondary | ICD-10-CM | POA: Diagnosis not present

## 2016-02-23 DIAGNOSIS — N390 Urinary tract infection, site not specified: Secondary | ICD-10-CM | POA: Diagnosis not present

## 2016-02-26 DIAGNOSIS — N200 Calculus of kidney: Secondary | ICD-10-CM | POA: Diagnosis not present

## 2016-03-14 ENCOUNTER — Other Ambulatory Visit: Payer: Self-pay | Admitting: Cardiology

## 2016-04-02 ENCOUNTER — Other Ambulatory Visit: Payer: Self-pay | Admitting: Cardiology

## 2016-04-02 NOTE — Telephone Encounter (Signed)
Rx(s) sent to pharmacy electronically.  

## 2016-04-08 ENCOUNTER — Other Ambulatory Visit: Payer: Self-pay | Admitting: Cardiology

## 2016-04-08 NOTE — Telephone Encounter (Signed)
Rx request sent to pharmacy.  

## 2016-04-15 ENCOUNTER — Other Ambulatory Visit: Payer: Self-pay | Admitting: Neurology

## 2016-04-17 ENCOUNTER — Other Ambulatory Visit: Payer: Self-pay | Admitting: Family Medicine

## 2016-04-17 NOTE — Telephone Encounter (Signed)
Venlafaxine XR 75 mg  Last refilled 01/16/2016 Last OV : 11/01/217  No new appt  Please advise

## 2016-04-24 ENCOUNTER — Other Ambulatory Visit: Payer: Self-pay | Admitting: Cardiology

## 2016-04-24 NOTE — Telephone Encounter (Signed)
Rx(s) sent to pharmacy electronically.  

## 2016-05-01 DIAGNOSIS — N3020 Other chronic cystitis without hematuria: Secondary | ICD-10-CM | POA: Diagnosis not present

## 2016-05-01 DIAGNOSIS — N3000 Acute cystitis without hematuria: Secondary | ICD-10-CM | POA: Diagnosis not present

## 2016-05-01 DIAGNOSIS — N200 Calculus of kidney: Secondary | ICD-10-CM | POA: Diagnosis not present

## 2016-05-27 ENCOUNTER — Other Ambulatory Visit: Payer: Self-pay | Admitting: Family Medicine

## 2016-06-05 DIAGNOSIS — N200 Calculus of kidney: Secondary | ICD-10-CM | POA: Diagnosis not present

## 2016-06-05 DIAGNOSIS — N3020 Other chronic cystitis without hematuria: Secondary | ICD-10-CM | POA: Diagnosis not present

## 2016-06-05 DIAGNOSIS — N3000 Acute cystitis without hematuria: Secondary | ICD-10-CM | POA: Diagnosis not present

## 2016-06-19 NOTE — Progress Notes (Signed)
Cardiology Office Note   Date:  06/20/2016   ID:  Andrew Joyce, DOB [birth year], MRN [id number]  PCP:  [place], FNP  Cardiologist:  Dr. Partick Musselman [place]      History of Present Illness: Andrew Joyce is a 56 y.o. male seen for follow up CAD. He has a hx of CAD s/p CABG in 2008, HTN, HL, CKD.  He suffered a non-STEMI in February 2013. Culprit was felt to be an occluded SVG-OM1/OM2. Native circumflex had 40-50% distal stenosis. There were no targets for revascularization and he was treated medically.   He has a history orthostatic intolerance. Imdur dose was reduced.  Echo May 2015 demonstrated EF 55-60%, mild inferior hypokinesis, top normal LA/RA size.Poorly visualized aortic valve, however, there is mild central AI - no stenosis.    On follow up today he is doing well from a cardiac standpoint. He has rare chest pain and uses sl Ntg about twice a month. He stays active- mainly with weight lifting. He does have recurrent UTIs followed by urology. He also has frequent calculi and is scheduled to see a stone specialist in [facility]. He has severe hypertriglyceridemia. Was started on fenofibrate last year but hasn't had repeat lab work.   Recent Labs: No results found for requested labs within last 8760 hours.  Wt Readings from Last 3 Encounters:  06/20/16 176 lb 3.2 oz (79.9 kg)  12/29/15 174 lb (78.9 kg)  12/18/15 172 lb 3.2 oz (78.1 kg)     Past Medical History:  Diagnosis Date  . 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 Chronic kidney disease (CKD), stage II (mild)   . Coronary artery disease    a. s/p CABG in 2008; b.  s/p cath in 2011 for med rx.;  c.  NSTEMI (02/2011):  LHC (02/2011):  LAD occluded, distal LAD filled via left to left collaterals, distal CFX 40-50%, proximal RCA occluded (right to right collaterals), distal RCA occluded, S-RCA occluded proximally, S-OM1/OM2 occluded (new from 2011 - culprit), L-LAD ok, ant  apical HK, EF 45-50% - med Rx.  [name] Dyslipidemia   . History of renal stone   . Hx of cardiovascular stress test    Nuclear (06/2009):  EF 71%, small area of infarct/ischemia in inferior wall-unchanged from prior  . Hx of echocardiogram    Echo (05/2013):  EF 55-60%, inf HK, mild AI, normal RVSF, RVSP 28 mmHg  . Hypertension   . Pancreatitis   . Recurrent UTI     Current Outpatient Prescriptions  Medication Sig Dispense Refill  . amLODipine (NORVASC) 5 MG tablet Take 1 tablet (5 mg total) by mouth daily. 30 tablet 6  . aspirin 81 MG tablet Take 1 tablet (81 mg total) by mouth daily.    . baclofen (LIORESAL) 10 MG tablet TAKE TWO TABLETS BY MOUTH IN THE MORNING, AND THEN TAKE ONE TABLET BY MOUTH AT LUNCHTIME, AND THEN TAKE THREE TABLETS BY MOUTH AT BEDTIME 180 tablet 11  . carbidopa-levodopa (SINEMET IR) 25-100 MG tablet Take 1 tablet by mouth at bedtime as needed (Restless leg syndrome). 20 tablet 3  . diazepam (VALIUM) 5 MG tablet Take 0.5-1 tablets (2.5-5 mg total) by mouth at bedtime. 60 tablet 3  . EPITOL 200 MG tablet TAKE ONE TABLET BY MOUTH TWICE DAILY 60 tablet 3  . fenofibrate 160 MG tablet TAKE ONE TABLET BY MOUTH ONCE DAILY 30 tablet 5  . fish oil-omega-3 fatty  acids 1000 MG capsule Take 2 capsules (2 g total) by mouth 2 (two) times daily.    . isosorbide mononitrate (IMDUR) 60 MG 24 hr tablet TAKE ONE & ONE-HALF TABLETS BY MOUTH ONCE DAILY 135 tablet 2  . levofloxacin (LEVAQUIN) 250 MG tablet Take 250 mg by mouth daily.  0  . losartan (COZAAR) 100 MG tablet Take 1 tablet (100 mg total) by mouth daily. 90 tablet 1  . losartan (COZAAR) 100 MG tablet TAKE ONE TABLET BY MOUTH ONCE DAILY 30 tablet 3  . methenamine (MANDELAMINE) 1 g tablet Take 1,000 mg by mouth 3 (three) times daily.     . metoprolol (LOPRESSOR) 50 MG tablet TAKE ONE TABLET BY MOUTH TWICE DAILY 60 tablet 10  . nitroGLYCERIN (NITROSTAT) 0.4 MG SL tablet Place 1 tablet (0.4 mg total) under the tongue every 5 (five)  minutes as needed. For chest pain 25 tablet 3  . pravastatin (PRAVACHOL) 40 MG tablet Take 1 tablet (40 mg total) by mouth daily. 90 tablet 1  . pravastatin (PRAVACHOL) 40 MG tablet TAKE ONE TABLET BY MOUTH ONCE DAILY 30 tablet 4  . traMADol (ULTRAM) 50 MG tablet TAKE 1 TABLET BY MOUTH THREE TIMES DAILY AS NEEDED 90 tablet 0  . venlafaxine XR (EFFEXOR-XR) 75 MG 24 hr capsule Take 1 capsule (75 mg total) by mouth daily with breakfast. 90 capsule 0  . venlafaxine XR (EFFEXOR-XR) 75 MG 24 hr capsule TAKE ONE CAPSULE BY MOUTH ONCE DAILY WITH BREAKFAST 90 capsule 0   No current facility-administered medications for this visit.     Allergies:   Patient has no known allergies.   Social History:  The patient  reports that he has never smoked. He has never used smokeless tobacco. He reports that he does not drink alcohol or use drugs.   Family History:  The patient's family history includes Healthy in his brother and sister; Hyperlipidemia in his father and mother; Hypertension in his father and mother.   ROS:  Please see the history of present illness.     All other systems reviewed and negative.   PHYSICAL EXAM: VS:  BP 130/84   Pulse (!) 58   Ht 5\' 10"  (1.778 m)   Wt 176 lb 3.2 oz (79.9 kg)   SpO2 98%   BMI 25.28 kg/m   Well nourished, well developed, in no acute distress  HEENT: normal  Neck: no JVD  Cardiac:  normal S1, S2; RRR; no murmur  Lungs:  clear to auscultation bilaterally, no wheezing, rhonchi or rales  Abd: soft, nontender, no hepatomegaly  Ext: no edema  Skin: warm and dry  Neuro:  CNs 2-12 intact, no focal abnormalities noted  Laboratory data:  Lab Results  Component Value Date   WBC 5.4 06/01/2013   HGB 13.8 06/01/2013   HCT 40.1 06/01/2013   PLT 196.0 06/01/2013   GLUCOSE 132 (H) 03/01/2015   CHOL 234 (H) 03/01/2015   TRIG 1,121 (H) 03/01/2015   HDL 28 (L) 03/01/2015   LDLDIRECT 45 03/01/2015   LDLCALC NOT CALC 03/01/2015   ALT 26 03/01/2015   AST 22  03/01/2015   NA 139 03/01/2015   K 4.6 03/01/2015   CL 100 03/01/2015   CREATININE 1.38 (H) 03/01/2015   BUN 32 (H) 03/01/2015   CO2 26 03/01/2015   TSH 3.44 06/01/2013   INR 1.03 02/26/2011   HGBA1C 5.9 06/02/2013     ASSESSMENT AND PLAN:  1. Orthostatic intolerance: he is now asymptomatic. 2.  CAD (coronary artery disease): s/p remote CABG in 2008. Occluded SVG to OM1 and OM2 in 2013.  Stable angina pectoris class 1. Continue aspirin, Plavix, nitrates, beta blocker. Sl Ntg refilled.  3. HTN (hypertension):  Controlled.  4. Hyperlipidemia- combined with severe hypertriglyceridemia. :   On pravastatin, fenofibrate, fish oil. Will order fasting lab work. 5. CKD (chronic kidney disease) stage 2, GFR 60-89 ml/min:  Repeat chemistries  I will follow up in 6 months.

## 2016-06-20 ENCOUNTER — Encounter: Payer: Self-pay | Admitting: Cardiology

## 2016-06-20 ENCOUNTER — Ambulatory Visit (INDEPENDENT_AMBULATORY_CARE_PROVIDER_SITE_OTHER): Payer: Medicare Other | Admitting: Cardiology

## 2016-06-20 VITALS — BP 130/84 | HR 58 | Ht 70.0 in | Wt 176.2 lb

## 2016-06-20 DIAGNOSIS — I209 Angina pectoris, unspecified: Secondary | ICD-10-CM | POA: Diagnosis not present

## 2016-06-20 DIAGNOSIS — I25119 Atherosclerotic heart disease of native coronary artery with unspecified angina pectoris: Secondary | ICD-10-CM | POA: Diagnosis not present

## 2016-06-20 DIAGNOSIS — E782 Mixed hyperlipidemia: Secondary | ICD-10-CM

## 2016-06-20 DIAGNOSIS — I10 Essential (primary) hypertension: Secondary | ICD-10-CM | POA: Diagnosis not present

## 2016-06-20 DIAGNOSIS — N182 Chronic kidney disease, stage 2 (mild): Secondary | ICD-10-CM

## 2016-06-20 DIAGNOSIS — I208 Other forms of angina pectoris: Secondary | ICD-10-CM

## 2016-06-20 MED ORDER — NITROGLYCERIN 0.4 MG SL SUBL: 0.4000 mg | SUBLINGUAL_TABLET | SUBLINGUAL | 3 refills | Status: AC | PRN

## 2016-06-20 NOTE — Addendum Note (Signed)
Addended by: Kathyrn Lass on: 06/20/2016 10:11 AM   Modules accepted: Orders

## 2016-06-20 NOTE — Patient Instructions (Signed)
Continue your current therapy  We will schedule you for fasting lab work  I will see you in 6 months.

## 2016-07-08 ENCOUNTER — Encounter: Payer: Self-pay | Admitting: Neurology

## 2016-07-08 ENCOUNTER — Ambulatory Visit (INDEPENDENT_AMBULATORY_CARE_PROVIDER_SITE_OTHER): Payer: Medicare Other | Admitting: Neurology

## 2016-07-08 VITALS — BP 110/74 | HR 76 | Ht 70.0 in | Wt 174.6 lb

## 2016-07-08 DIAGNOSIS — I25119 Atherosclerotic heart disease of native coronary artery with unspecified angina pectoris: Secondary | ICD-10-CM | POA: Diagnosis not present

## 2016-07-08 DIAGNOSIS — G709 Myoneural disorder, unspecified: Secondary | ICD-10-CM | POA: Diagnosis not present

## 2016-07-08 DIAGNOSIS — R253 Fasciculation: Secondary | ICD-10-CM

## 2016-07-08 DIAGNOSIS — G2581 Restless legs syndrome: Secondary | ICD-10-CM

## 2016-07-08 MED ORDER — DIAZEPAM 5 MG PO TABS: 2.5000 mg | ORAL_TABLET | Freq: Every day | ORAL | 3 refills | Status: DC

## 2016-07-08 MED ORDER — TRAMADOL HCL 50 MG PO TABS: 50.0000 mg | ORAL_TABLET | Freq: Every evening | ORAL | 5 refills | Status: DC | PRN

## 2016-07-08 NOTE — Progress Notes (Signed)
Follow-up Visit   Date: 07/08/16    Andrew Joyce MRN: [id number] DOB: 12-22-1960   Interim History: Andrew Joyce is a 56 y.o. left-handed Caucasian male with congenital exstrophy status post multiple surgeries complicated by chronic UTI, CAD s/p CABG (2008), hyperlipidemia, hypertension, and stage II CKD returning to the clinic for follow-up of cramp-fasciculation syndrome.  History of present illness: Starting in October 2015, he developed "firing nerves" and cramping of the left legs. Symptoms are worse at night and keep him from sleeping because of painful cramps. They usually last several minutes. He has no associated weakness or similar symptoms on the right leg or arms. He went to see his PCP who recommended melatonin, muscle relaxants (robaxin), ambien, and heat packs. He also tried taking valium at night which did not help much. Since starting baclofen the agitation of the leg and cramps improved.  His left leg weakness remains unchanged.  In 2016, he noticed new twitches involving his right lower leg, but no associated weakness.  He also complains of severe right mid-back pain, worse with any type of activity. MRI thoracic spine did not show any nerve impingement.   He was referred to Dr. Tamala Julian who recommended exercises and steroid injection, but there was no lasting benefit.    UPDATE 05/18/2015:  He complains of increased painful muscle cramps affecting the left posterior calf, lasting up to 15-minutes and occuring 1-2 times daily. He always has to stop what he is doing and wait until it relaxes, but the pain can be severe.  He has not been exercising as much because of concern that it may trigger the leg to cramp.  He has seen a number of physicians for his mid thoracic pain and unfortunately has not been able to achieve any significant pain relief.    UPDATE 08/22/2015:  At his last visit, I started diazepam 2.25m BID for his cramps, but he has not noticed any  significant change.  He is still having severe and painful cramps of the legs and also noticed new fasciculations over the right lower leg.  No new weakness of the extremities.  He is working with Dr. STamala Julianto get better pain relief for his scapula pain. He is still exercising daily and able to lift the same weight as previously.  UPDATE 12/29/2015:  Starting around the fall of 2017, he began having spells of restlessness of the of the left leg exclusively when he is laying in bed.  He often has to get up and walk to relieve the discomfort.  Symptoms occur about 3-4 times per month and last a few days.  His cramps are better controlled on a combination of baclofen 238mTID, carbamazepine 20027mID, diazepam 2.5mg14ms, and tramadol 50mg38m.  He denies any new weakness.  Notable interval history also includes a recent diagnosis of basal cell carcinoma of the right forehead, which has been resection.    UPDATE 07/08/2016:   He is here for 6 month appointment.  He feels like there is very mild worsening symptoms of muscle twitches, cramps, and restless sensation of the lower legs, worse on the left.  He tried sinemet but developed nausea and did not appreciate marked benefit, so has not been taking this.   No new weakness, falls, or hospitalizations.    From a medical standpoint, he is having frequent kidney stones [facility]  Current Outpatient Prescriptions on File Prior  to Visit  Medication Sig Dispense Refill  . amLODipine (NORVASC) 5 MG tablet Take 1 tablet (5 mg total) by mouth daily. 30 tablet 6  . aspirin 81 MG tablet Take 1 tablet (81 mg total) by mouth daily.    . baclofen (LIORESAL) 10 MG tablet TAKE TWO TABLETS BY MOUTH IN THE MORNING, AND THEN TAKE ONE TABLET BY MOUTH AT LUNCHTIME, AND THEN TAKE THREE TABLETS BY MOUTH AT BEDTIME 180 tablet 11  . carbidopa-levodopa (SINEMET IR) 25-100 MG tablet Take 1 tablet by mouth at bedtime as needed  (Restless leg syndrome). 20 tablet 3  . diazepam (VALIUM) 5 MG tablet Take 0.5-1 tablets (2.5-5 mg total) by mouth at bedtime. 60 tablet 3  . fenofibrate 160 MG tablet TAKE ONE TABLET BY MOUTH ONCE DAILY 30 tablet 5  . fish oil-omega-3 fatty acids 1000 MG capsule Take 2 capsules (2 g total) by mouth 2 (two) times daily.    . isosorbide mononitrate (IMDUR) 60 MG 24 hr tablet TAKE ONE & ONE-HALF TABLETS BY MOUTH ONCE DAILY 135 tablet 2  . levofloxacin (LEVAQUIN) 250 MG tablet Take 250 mg by mouth daily.  0  . losartan (COZAAR) 100 MG tablet Take 1 tablet (100 mg total) by mouth daily. 90 tablet 1  . losartan (COZAAR) 100 MG tablet TAKE ONE TABLET BY MOUTH ONCE DAILY 30 tablet 3  . methenamine (MANDELAMINE) 1 g tablet Take 1,000 mg by mouth 3 (three) times daily.     . metoprolol (LOPRESSOR) 50 MG tablet TAKE ONE TABLET BY MOUTH TWICE DAILY 60 tablet 10  . nitroGLYCERIN (NITROSTAT) 0.4 MG SL tablet Place 1 tablet (0.4 mg total) under the tongue every 5 (five) minutes as needed. For chest pain 25 tablet 3  . pravastatin (PRAVACHOL) 40 MG tablet Take 1 tablet (40 mg total) by mouth daily. 90 tablet 1  . traMADol (ULTRAM) 50 MG tablet TAKE 1 TABLET BY MOUTH THREE TIMES DAILY AS NEEDED 90 tablet 0  . venlafaxine XR (EFFEXOR-XR) 75 MG 24 hr capsule Take 1 capsule (75 mg total) by mouth daily with breakfast. 90 capsule 0  . venlafaxine XR (EFFEXOR-XR) 75 MG 24 hr capsule TAKE ONE CAPSULE BY MOUTH ONCE DAILY WITH BREAKFAST 90 capsule 0   No current facility-administered medications on file prior to visit.     Allergies: No Known Allergies  Review of Systems:  CONSTITUTIONAL: No fevers, chills, night sweats, or weight loss.  EYES: No visual changes or eye pain ENT: No hearing changes.  No history of nose bleeds.   RESPIRATORY: No cough, wheezing and shortness of breath.   CARDIOVASCULAR: Negative for chest pain, and palpitations.   GI: Negative for abdominal discomfort, blood in stools or black  stools.  No recent change in bowel habits.   GU:  No history of incontinence.   MUSCLOSKELETAL: No history of joint pain or swelling.  +cramps.   SKIN: Negative for lesions, rash, and itching.   ENDOCRINE: Negative for cold or heat intolerance, polydipsia or goiter.   PSYCH:  No depression or anxiety symptoms.   NEURO: As Above.   Vital Signs:  BP 110/74   Pulse 76   Ht 5' 10"  (1.778 m)   Wt 174 lb 9 oz (79.2 kg)   SpO2 97%   BMI 25.05 kg/m   Neurological Exam: MENTAL STATUS including orientation to time, place, person, recent and remote memory, attention span and concentration, language, and fund of knowledge is normal.  Speech is not dysarthric.  CRANIAL NERVES:  Face is symmetric.   MOTOR:  Mild atrophy of the left lower leg (quads, medial gastroc). Prominent and active fasciculations of the left > right calf and left thigh. Normal tone.   Right Upper Extremity:    Left Upper Extremity:    Deltoid  5/5   Deltoid  5/5   Biceps  5/5   Biceps  5/5   Triceps  5/5   Triceps  5/5   Wrist extensors  5/5   Wrist extensors  5/5   Wrist flexors  5/5   Wrist flexors  5/5   Finger extensors  5/5   Finger extensors  5/5   Finger flexors  5/5   Finger flexors  5/5   Dorsal interossei  5/5   Dorsal interossei  5/5   Abductor pollicis  5/5   Abductor pollicis  5/5   Tone (Ashworth scale)  0  Tone (Ashworth scale)  0   Right Lower Extremity:    Left Lower Extremity:    Hip flexors  5/5   Hip flexors  5/5   Hip extensors  5/5   Hip extensors  5/5   Knee flexors  5/5   Knee flexors *improved 5/5   Knee extensors  5/5   Knee extensors *improved 5/5   Dorsiflexors  5/5   Dorsiflexors *improved 5/5   Plantarflexors  5/5   Plantarflexors  5/5   Toe extensors  5/5   Toe extensors  5/5   Toe flexors  5-/5   Toe flexors  5-/5   Tone (Ashworth scale)  0  Tone (Ashworth scale)  0   MSRs:  Right                                                                 Left brachioradialis 1+   brachioradialis 1+  biceps 1+  biceps 1+  triceps 1+  triceps 1+  patellar 2+  patellar 2+  ankle jerk 1+  ankle jerk 1+   COORDINATION/GAIT:   Gait narrow based and stable.   Data: EMG 12/15/2013: 1. Chronic C6 radiculopathy affecting the left upper extremity. 2. Left ulnar neuropathy with slowing across the elbow, purely demyelinating in type. 3. Multilevel intraspinal canal lesion affecting L2-S1 myotomes bilaterally. These findings are moderate in degree electrically and worse on the left side involving the L2-L4 myotome. Active changes are also seen affecting the medial gastrocnemius muscle. These findings are insufficient for the diagnosis of motor neuron disease, recommend repeat electrodiagnostic testing in 6 months if clinically indicated.  Labs 07/14/2014:  CK 252*, vitamin D 22* Labs 11/25/2013:  CK 215, aldolase 9.2*, PTH 34, SPEP/UPEP with IFE no Mo protein, copper 116, zinc 67, CRP 0.6*, ESR 47  CT lumbar spine 12/17/2013: 1. Severe degenerative disc disease with disc height loss at L5-S1 with a mild broad-based disc osteophyte complex and right foraminal narrowing. 2. Mild broad-based disc bulge at L4-5 without foraminal or central canal stenosis.  MRI thoracic spine wo contrast 05/29/2014: Small central disc protrusions T3-4 and T7-8 without neural impingement or significant spinal stenosis. No acute abnormality.  MRI lumbar spine wo contrast 05/29/2014:  Mild to moderate spinal stenosis at L4-5 with small central disc protrusion and diffuse disc bulging and spondylosis.  Postop laminectomy  left L5-S1 without recurrent disc protrusion. There is spondylosis causing subarticular and foraminal stenosis bilaterally at L5-S1.  Lab Results  Component Value Date   FERRITIN 180.0 12/29/2015     IMPRESSION/PLAN: 1.  Cramp-fasciculation syndrome, mild worsening of symptoms  - He has active left leg fasciculations and painful cramps, no weakness on exam  - Work-up to-date shows:   EMG showed chronic motor axon loss changes affecting L2-S1 myotomes bilaterally, with active changes isolated to the medial gastrocnemius muscle with mild CK elevation. Imaging only shows small disc protrusion at L4-5.   These two features with his exam initially was concerned for early onset lower motor neuron disorder, possibly progressive muscular atrophy), but the fact that his weakness has improved argues against this.  Differential includes:  Cramp-fasciculation syndrome vs progressive muscular atrophy.  If he develops weakness again, he will need repeat NCS/EMG.   - For cramps, continue carbamazepine 266m BID, diazepam 2.5 - 535mmg at bedtime, and baclofen 2041mID  - For severe pain, okay to take tramadolol 57m95m bedtime prn  2.  Restless leg syndrome   - Ferritin is normal  - No significant benefit with sinemet and he had adverse effect of nausea, so will try to optimize RLS symptoms with tramadol and valium, which he already takes for muscle cramps  - Continue valium 2.5-5mg 76mbedtime as needed  - Consider trial of dopamine agonist going forward  Return to clinic in 6 months, or sooner as needed  The duration of this appointment visit was 25 minutes of face-to-face time with the patient.  Greater than 50% of this time was spent in counseling, explanation of diagnosis, planning of further management, and coordination of care.   Thank you for allowing me to participate in patient's care.  If I can answer any additional questions, I would be pleased to do so.    Sincerely,    Brandice Busser K. PatelPosey Pronto

## 2016-07-08 NOTE — Patient Instructions (Signed)
Return to clinic 6 months

## 2016-07-09 ENCOUNTER — Telehealth: Payer: Self-pay | Admitting: *Deleted

## 2016-07-09 DIAGNOSIS — N200 Calculus of kidney: Secondary | ICD-10-CM | POA: Diagnosis not present

## 2016-07-09 NOTE — Telephone Encounter (Signed)
Entered in error

## 2016-07-15 ENCOUNTER — Other Ambulatory Visit: Payer: Self-pay | Admitting: Family Medicine

## 2016-07-15 NOTE — Telephone Encounter (Signed)
Refill done.  

## 2016-07-29 DIAGNOSIS — N200 Calculus of kidney: Secondary | ICD-10-CM | POA: Diagnosis not present

## 2016-07-29 DIAGNOSIS — M898X1 Other specified disorders of bone, shoulder: Secondary | ICD-10-CM | POA: Diagnosis not present

## 2016-07-29 DIAGNOSIS — R109 Unspecified abdominal pain: Secondary | ICD-10-CM | POA: Diagnosis not present

## 2016-07-29 DIAGNOSIS — G8929 Other chronic pain: Secondary | ICD-10-CM | POA: Diagnosis not present

## 2016-07-29 DIAGNOSIS — N50819 Testicular pain, unspecified: Secondary | ICD-10-CM | POA: Diagnosis not present

## 2016-07-29 DIAGNOSIS — G894 Chronic pain syndrome: Secondary | ICD-10-CM | POA: Diagnosis not present

## 2016-08-08 ENCOUNTER — Encounter: Payer: Self-pay | Admitting: Family Medicine

## 2016-08-08 ENCOUNTER — Ambulatory Visit (INDEPENDENT_AMBULATORY_CARE_PROVIDER_SITE_OTHER): Payer: Medicare Other | Admitting: Family Medicine

## 2016-08-08 ENCOUNTER — Other Ambulatory Visit: Payer: Self-pay | Admitting: Cardiology

## 2016-08-08 DIAGNOSIS — M546 Pain in thoracic spine: Secondary | ICD-10-CM | POA: Diagnosis not present

## 2016-08-08 DIAGNOSIS — I25119 Atherosclerotic heart disease of native coronary artery with unspecified angina pectoris: Secondary | ICD-10-CM | POA: Diagnosis not present

## 2016-08-08 DIAGNOSIS — G8929 Other chronic pain: Secondary | ICD-10-CM

## 2016-08-08 MED ORDER — TRAMADOL HCL 50 MG PO TABS: 50.0000 mg | ORAL_TABLET | Freq: Two times a day (BID) | ORAL | 5 refills | Status: DC | PRN

## 2016-08-08 MED ORDER — LEVOTHYROXINE SODIUM 25 MCG PO TABS: 25.0000 ug | ORAL_TABLET | Freq: Every day | ORAL | 1 refills | Status: DC

## 2016-08-08 MED ORDER — ALLOPURINOL 100 MG PO TABS: 100.0000 mg | ORAL_TABLET | Freq: Every day | ORAL | 6 refills | Status: DC

## 2016-08-08 NOTE — Patient Instructions (Signed)
Good to see you  Refilled tramadol  Synthroid 40mcg daily to help the effexor.  COntinue the effexor.  Allopurionl 100mg  jto help pee out the oxalate Avoid spinach, tea, chocolate as main causes of increase oxalate.  Over the counter Tart cherry extract any dose at night Magnesium oxide 400mg -800 daily  See me again in 4-6 weeks

## 2016-08-08 NOTE — Assessment & Plan Note (Signed)
Worsening pain again. Patient is in not responding to begin care with been doing recently. We discussed with patient at great length. Patient will start possibly a low dose of allopurinol. No flatus will be beneficial. We'll not increased with patient having a history of chronic kidney disease. Does appear the patient does have the potential for a Wegener's granulomatosis. Has seen rheumatology did not want to make any significant changes. Patient has had an cardiac issues as well as renal issues in the past. In addition of this we discussed over-the-counter medications a could also be beneficial for patient's oxalate levels to see if that would be beneficial. Patient follow-up with me again in 4 weeks to see how patient is responding.

## 2016-08-08 NOTE — Progress Notes (Signed)
[name] [address], [place] [address] Phone: [phone] Subjective:    CC: Back pain follow up  CZY:[id number]  Andrew Joyce is a 56 y.o. male coming in with complaint of back pain. Patient is being seen by neurology for multiple different things and is had chronic irritated back pain for quite some time.  Patient was continued to have thoracic back pain. Had MRI showing small central disc protrusions of T3 and 4 as well as T7 and T8. Patient was sent to have an epidural steroid injection. Patient did not respond to the T3-T4 and did have one at the T7-T8 which also did not give him any benefit. 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 [name] Hx of CAD. Patient saw rheumatology and they stated that they would not change any management at this time.  Patient has been treated more for her chronic pain at this juncture. Patient was started on Effexor and we did titrate up to 75 mg. Still states that he feels like this is helping a little bit. Avoid increasing secondary to patient's having history of coronary artery disease. Patient continues to see other providers. Continues to have significant pain in this area. Seems to be worsening over the course last several months. Tramadol does seem to help. Patient has been recently diagnosed also with another renal stone was found to have oxalate crystals. Has had difficulty with his bladder secondary to multiple other things as well.   Past Medical History:  Diagnosis Date  . 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 Chronic kidney disease (CKD), stage II (mild)   . Coronary artery disease    a. s/p CABG in 2008; b.  s/p cath in 2011 for med rx.;  c.  NSTEMI (02/2011):  LHC (02/2011):  LAD occluded, distal LAD filled via left to left collaterals, distal CFX 40-50%, proximal RCA occluded (right to right collaterals), distal RCA occluded, S-RCA  occluded proximally, S-OM1/OM2 occluded (new from 2011 - culprit), L-LAD ok, ant apical HK, EF 45-50% - med Rx.  [name] Dyslipidemia   . History of renal stone   . Hx of cardiovascular stress test    Nuclear (06/2009):  EF 71%, small area of infarct/ischemia in inferior wall-unchanged from prior  . Hx of echocardiogram    Echo (05/2013):  EF 55-60%, inf HK, mild AI, normal RVSF, RVSP 28 mmHg  . Hypertension   . Pancreatitis   . Recurrent UTI    Past Surgical History:  Procedure Laterality Date  . CARDIAC CATHETERIZATION  07/28/2009   EF 60%; Grafts patent except for SVG to the AM and PD. Native RCA is occluded.  [name] CARDIAC CATHETERIZATION  03/20/2006   EF 60-65%  . CARDIOVASCULAR STRESS TEST  06/12/2009   EF 71%, SMALL AREA OF INFARCT/ISCHEMIA IN INFERIOR WALL; FELT TO NOT BE CHANGED.  [name] CORONARY ARTERY BYPASS GRAFT  03/2006   LIMA GRAFT TO LAD, SAPHENOUS VEIN GRAFT TO THE ACUTE MARGINAL BRANCH THE RIGHT CORONARY, SAPHENOUS VEIN GRAFT TO THE PDA, SEQUENTIAL VEIN GRAFT TO THE FIRST AND SECOND OBTUSE MARGINAL VESSELS  . LEFT HEART CATHETERIZATION WITH CORONARY ANGIOGRAM N/A 02/26/2011   Procedure: LEFT HEART CATHETERIZATION WITH CORONARY ANGIOGRAM;  Surgeon: Thayer Headings, MD;  Location: [facility] CATH LAB;  Service: Cardiovascular;  Laterality: N/A;   Social History  Substance Use Topics  . Smoking status: Never Smoker  . Smokeless  tobacco: Never Used  . Alcohol use No   No Known Allergies Family History  Problem Relation Age of Onset  . Hyperlipidemia Mother        Living, 31  . Hypertension Mother   . Hyperlipidemia Father        Living 12  . Hypertension Father   . Healthy Sister   . Healthy Brother     History of elevated ESR and positive C-ANCA      Past medical history, social, surgical and family history all reviewed in electronic medical record.   Review of Systems: No headache, visual changes, nausea, vomiting, diarrhea, constipation, dizziness, abdominal pain, skin rash,  fevers, chills, night sweats, weight loss, swollen lymph nodes, body aches, joint swelling, muscle aches, chest pain, shortness of breath, mood changes.    Objective  Blood pressure 136/84, pulse (!) 58, height 5' 10"  (1.778 m), weight 171 lb (77.6 kg), SpO2 96 %.  Systems examined below as of 08/08/16 General: NAD A&O x3 mood, affect normal  HEENT: Pupils equal, extraocular movements intact no nystagmus Respiratory: not short of breath at rest or with speaking Cardiovascular: No lower extremity edema, non tender Skin: Warm dry intact with no signs of infection or rash on extremities or on axial skeleton. Abdomen: Soft nontender, no masses Neuro: Cranial nerves  intact, neurovascularly intact in all extremities with 2+ DTRs and 2+ pulses. Lymph: No lymphadenopathy appreciated today  Gait normal with good balance and coordination.  MSK: Non tender with full range of motion and good stability and symmetric strength and tone of shoulders, elbows, wrist,  knee hips and ankles bilaterally.   Improvement in scapular dysfunction. Still some mild asymmetry noted.  Back Exam:  Inspection: Unremarkable  Motion: Flexion 45 deg, Extension 25 deg, Side Bending to 35 deg bilaterally,  Rotation to 35 deg bilaterally  SLR laying: Negative  XSLR laying: Negative  Palpable tenderness: Tender to palpation in the paraspinal musculature mostly over the right scapula. Also some on the left scapular FABER: negative. Sensory change: Gross sensation intact to all lumbar and sacral dermatomes.  Reflexes: 2+ at both patellar tendons, 2+ at achilles tendons, Babinski's downgoing.  Strength at foot  Plantar-flexion: 5/5 Dorsi-flexion: 5/5 Eversion: 5/5 Inversion: 5/5  Leg strength  Quad: 5/5 Hamstring: 5/5 Hip flexor: 5/5 Hip abductors: 5/5  Gait unremarkable.    Impression and Recommendations:     This case required medical decision making of moderate complexity.

## 2016-08-14 ENCOUNTER — Telehealth: Payer: Self-pay | Admitting: Neurology

## 2016-08-14 NOTE — Telephone Encounter (Signed)
[facility] called and said that PT's prescription of Tegretol was being switched from one brand to another brand and wants to know if this is ok CB# [phone]

## 2016-08-15 NOTE — Telephone Encounter (Signed)
Brand ofTegretol is being changed at pharmacy, is this OK?

## 2016-08-15 NOTE — Telephone Encounter (Signed)
Yes this is okay 

## 2016-08-15 NOTE — Telephone Encounter (Signed)
Called pharmacy, spoke w/Hui, advsd ok to change

## 2016-08-23 ENCOUNTER — Other Ambulatory Visit: Payer: Self-pay | Admitting: Cardiology

## 2016-08-26 DIAGNOSIS — N200 Calculus of kidney: Secondary | ICD-10-CM | POA: Diagnosis not present

## 2016-09-04 ENCOUNTER — Telehealth: Payer: Self-pay | Admitting: Family Medicine

## 2016-09-04 ENCOUNTER — Telehealth: Payer: Self-pay | Admitting: Neurology

## 2016-09-04 NOTE — Telephone Encounter (Signed)
Andrew Joyce from [facility] [address] called stating that this pt has been prescribed Tramadol from multiple providers and is getting it from multiple pharmacies. She said that this information was found on the [place].

## 2016-09-04 NOTE — Telephone Encounter (Signed)
Patient will now be refilled any pain medications anymore at this time.

## 2016-09-04 NOTE — Telephone Encounter (Signed)
[facility] left a message regarding pt's tramadol and would like a call back at [phone]

## 2016-09-05 NOTE — Telephone Encounter (Signed)
I spoke with the pharmacist Rosann Auerbach) and she is going to fax over some paperwork about patient's refills on tramadol.

## 2016-09-05 NOTE — Telephone Encounter (Signed)
[facility] called again and I gave her Dr. Darliss Ridgel response.   She gave me the dates of his refills, Dr. Diona Fanti gave him  89 on 8-11 60 on 7-10,  also in July a [facility] doctor prescribed him 25 on 7-3 60 on 6-12 60 on 5-13 60 on 4-14 60 on 3-4  He has also gotten [place] tramadol filled at [facility] on 5-25 for 90  She is going to call Dr. [name] office asking if they want to continue the Pts Valium as well

## 2016-09-10 DIAGNOSIS — N3000 Acute cystitis without hematuria: Secondary | ICD-10-CM | POA: Diagnosis not present

## 2016-09-12 ENCOUNTER — Encounter: Payer: Self-pay | Admitting: Family Medicine

## 2016-09-12 ENCOUNTER — Ambulatory Visit (INDEPENDENT_AMBULATORY_CARE_PROVIDER_SITE_OTHER): Payer: Medicare Other | Admitting: Family Medicine

## 2016-09-12 VITALS — BP 132/82 | HR 66 | Ht 70.0 in | Wt 177.0 lb

## 2016-09-12 DIAGNOSIS — R293 Abnormal posture: Secondary | ICD-10-CM | POA: Diagnosis not present

## 2016-09-12 DIAGNOSIS — M899 Disorder of bone, unspecified: Secondary | ICD-10-CM | POA: Diagnosis not present

## 2016-09-12 DIAGNOSIS — I25119 Atherosclerotic heart disease of native coronary artery with unspecified angina pectoris: Secondary | ICD-10-CM | POA: Diagnosis not present

## 2016-09-12 DIAGNOSIS — M25511 Pain in right shoulder: Secondary | ICD-10-CM | POA: Diagnosis not present

## 2016-09-12 MED ORDER — VENLAFAXINE HCL ER 37.5 MG PO CP24: 37.5000 mg | ORAL_CAPSULE | Freq: Every day | ORAL | 1 refills | Status: DC

## 2016-09-12 NOTE — Patient Instructions (Addendum)
Good to see you  Andrew Joyce is your friend.  Try muscle trigger point injections today  Want you to titrate off slowly the effexor.  Go to 37.5 mg daily for next 2 weeks.  Start the lyrica at night  See pain medicine and have them see if they can take on the tramadol. At this time my office will not refill it due to the miscommunication. You would be in better hands there.  If trouble with the effexor then we can go back up on it but see what pain medicine wants to do.  See me again in 6 weeks.

## 2016-09-12 NOTE — Progress Notes (Signed)
Andrew Joyce, Andrew Joyce Phone: [phone] Subjective:    CC: Back pain follow up  [id number]  Andrew Joyce is a 56 y.o. male coming in with complaint of back pain. Patient is being seen by neurology for multiple different things and is had chronic irritated back pain for quite some time.  Patient was continued to have thoracic back pain. Had MRI showing small central disc protrusions of T3 and 4 as well as T7 and T8. Patient was sent to have an epidural steroid injection. Patient did not respond to the T3-T4 and did have one at the T7-T8 which also did not give him any benefit. 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 [name] Hx of CAD. Patient saw rheumatology and they stated that they would not change any management at this time.  Patient has been treated more for her chronic pain at this juncture. Patient was started on Effexor and we did titrate up to 75 mg. Still states that he feels like this is helping a little bit. Avoid increasing secondary to patient's having history of coronary artery disease. Patient was given prescriptions of tramadol from previously. Patient's pharmacy did: States that he was getting multiple different refills. Patient was given refills for myself, Dr. Posey Pronto, [place], and a physician in [place]. Patient states   Past Medical History:  Diagnosis Date  . 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 Chronic kidney disease (CKD), stage II (mild)   . Coronary artery disease    a. s/p CABG in 2008; b.  s/p cath in 2011 for med rx.;  c.  NSTEMI (02/2011):  LHC (02/2011):  LAD occluded, distal LAD filled via left to left collaterals, distal CFX 40-50%, proximal RCA occluded (right to right collaterals), distal RCA occluded, S-RCA occluded proximally, S-OM1/OM2 occluded (new from 2011 - culprit), L-LAD ok, ant apical HK, EF 45-50% - med Rx.  [name]  Dyslipidemia   . History of renal stone   . Hx of cardiovascular stress test    Nuclear (06/2009):  EF 71%, small area of infarct/ischemia in inferior wall-unchanged from prior  . Hx of echocardiogram    Echo (05/2013):  EF 55-60%, inf HK, mild AI, normal RVSF, RVSP 28 mmHg  . Hypertension   . Pancreatitis   . Recurrent UTI    Past Surgical History:  Procedure Laterality Date  . CARDIAC CATHETERIZATION  07/28/2009   EF 60%; Grafts patent except for SVG to the AM and PD. Native RCA is occluded.  [name] CARDIAC CATHETERIZATION  03/20/2006   EF 60-65%  . CARDIOVASCULAR STRESS TEST  06/12/2009   EF 71%, SMALL AREA OF INFARCT/ISCHEMIA IN INFERIOR WALL; FELT TO NOT BE CHANGED.  [name] CORONARY ARTERY BYPASS GRAFT  03/2006   LIMA GRAFT TO LAD, SAPHENOUS VEIN GRAFT TO THE ACUTE MARGINAL BRANCH THE RIGHT CORONARY, SAPHENOUS VEIN GRAFT TO THE PDA, SEQUENTIAL VEIN GRAFT TO THE FIRST AND SECOND OBTUSE MARGINAL VESSELS  . LEFT HEART CATHETERIZATION WITH CORONARY ANGIOGRAM N/A 02/26/2011   Procedure: LEFT HEART CATHETERIZATION WITH CORONARY ANGIOGRAM;  Surgeon: Thayer Headings, MD;  Location: [facility] CATH LAB;  Service: Cardiovascular;  Laterality: N/A;   Social History  Substance Use Topics  . Smoking status: Never Smoker  . Smokeless tobacco: Never Used  . Alcohol use No   No Known Allergies Family History  Problem Relation Age of Onset  .  Hyperlipidemia Mother        Living, 49  . Hypertension Mother   . Hyperlipidemia Father        Living 76  . Hypertension Father   . Healthy Sister   . Healthy Brother     History of elevated ESR and positive C-ANCA      Past medical history, social, surgical and family history all reviewed in electronic medical record.   Review of Systems: No headache, visual changes, nausea, vomiting, diarrhea, constipation, dizziness, abdominal pain, skin rash, fevers, chills, night sweats, weight loss, swollen lymph nodes, chest pain, shortness of breath, mood changes.  Positive  body aches and muscle aches   Objective  Blood pressure 132/82, pulse 66, height _0  (1.778 m), weight 177 lb (80.3 kg).  Systems examined below as of 09/12/16 General: NAD A&O x3 mood, affect normal  HEENT: Pupils equal, extraocular movements intact no nystagmus Respiratory: not short of breath at rest or with speaking Cardiovascular: No lower extremity edema, non tender Skin: Warm dry intact with no signs of infection or rash on extremities or on axial skeleton. Abdomen: Soft nontender, no masses Neuro: Cranial nerves  intact, neurovascularly intact in all extremities with 2+ DTRs and 2+ pulses. Lymph: No lymphadenopathy appreciated today  Gait normal with good balance and coordination.  MSK: Non tender with full range of motion and good stability and symmetric strength and tone of shoulders, elbows, wrist,  knee hips and ankles bilaterally.   Right shoulder exam shows the patient has no impingement. Patient does have what seems to be a trigger point injections in the right trapezius muscle. Back Exam:  Inspection: Unremarkable  Motion: Flexion 45 deg, Extension 25 deg, Side Bending to 35 deg bilaterally,  Rotation to 35 deg bilaterally  SLR laying: Negative  XSLR laying: Negative  Palpable tenderness: Tender to palpation in the paraspinal musculature mostly over the right scapula. Also some on the left scapular FABER: negative. Sensory change: Gross sensation intact to all lumbar and sacral dermatomes.  Reflexes: 2+ at both patellar tendons, 2+ at achilles tendons, Babinski's downgoing.  Strength at foot  Plantar-flexion: 5/5 Dorsi-flexion: 5/5 Eversion: 5/5 Inversion: 5/5  Leg strength  Quad: 5/5 Hamstring: 5/5 Hip flexor: 5/5 Hip abductors: 5/5  Gait unremarkable.   Procedure note After verbal consent patient was prepped with call swabs and with a 25-gauge half-inch needle used total 3 mL of 0.5% Marcaine and 1 mL of Kenalog 40 mg/dL. Injected in 4 distinct trigger points.   Minimal blood ls. Band-Aids placed. Post injection instructions given.   Impression and Recommendations:     This case required medical decision making of moderate complexity.

## 2016-09-12 NOTE — Assessment & Plan Note (Signed)
Patient given injections today. Tolerated the procedure well. Patient has had significant difficulty with this. MRI back in 2016 did not show any significant changes in does have the mild protruding discs that did not respond to the epidurals. Patient is going to be going to pain management. Discussed with him that we will no longer refill the tramadol. Patient's in pain management was to be changing to Lyrica. I do think that this is worth a try. I do want him to titrate down off the Effexor. Went and 37.5 mg a see if this will be beneficial. Encourage patient to continue all other medications at this time. Follow-up with me again in 6 weeks

## 2016-09-24 DIAGNOSIS — Z951 Presence of aortocoronary bypass graft: Secondary | ICD-10-CM | POA: Diagnosis not present

## 2016-09-24 DIAGNOSIS — E785 Hyperlipidemia, unspecified: Secondary | ICD-10-CM | POA: Diagnosis not present

## 2016-09-24 DIAGNOSIS — G894 Chronic pain syndrome: Secondary | ICD-10-CM | POA: Diagnosis not present

## 2016-09-24 DIAGNOSIS — Z79899 Other long term (current) drug therapy: Secondary | ICD-10-CM | POA: Diagnosis not present

## 2016-09-24 DIAGNOSIS — M79606 Pain in leg, unspecified: Secondary | ICD-10-CM | POA: Diagnosis not present

## 2016-09-24 DIAGNOSIS — Z7982 Long term (current) use of aspirin: Secondary | ICD-10-CM | POA: Diagnosis not present

## 2016-09-24 DIAGNOSIS — G8929 Other chronic pain: Secondary | ICD-10-CM | POA: Diagnosis not present

## 2016-09-24 DIAGNOSIS — N189 Chronic kidney disease, unspecified: Secondary | ICD-10-CM | POA: Diagnosis not present

## 2016-09-24 DIAGNOSIS — I129 Hypertensive chronic kidney disease with stage 1 through stage 4 chronic kidney disease, or unspecified chronic kidney disease: Secondary | ICD-10-CM | POA: Diagnosis not present

## 2016-09-24 DIAGNOSIS — R109 Unspecified abdominal pain: Secondary | ICD-10-CM | POA: Diagnosis not present

## 2016-09-24 DIAGNOSIS — Z87718 Personal history of other specified (corrected) congenital malformations of genitourinary system: Secondary | ICD-10-CM | POA: Diagnosis not present

## 2016-09-24 DIAGNOSIS — I2510 Atherosclerotic heart disease of native coronary artery without angina pectoris: Secondary | ICD-10-CM | POA: Diagnosis not present

## 2016-09-24 DIAGNOSIS — M898X1 Other specified disorders of bone, shoulder: Secondary | ICD-10-CM | POA: Diagnosis not present

## 2016-09-24 DIAGNOSIS — R3982 Chronic bladder pain: Secondary | ICD-10-CM | POA: Diagnosis not present

## 2016-09-24 DIAGNOSIS — N50819 Testicular pain, unspecified: Secondary | ICD-10-CM | POA: Diagnosis not present

## 2016-10-04 DIAGNOSIS — N3000 Acute cystitis without hematuria: Secondary | ICD-10-CM | POA: Diagnosis not present

## 2016-10-09 ENCOUNTER — Other Ambulatory Visit: Payer: Self-pay | Admitting: Cardiology

## 2016-10-15 DIAGNOSIS — K760 Fatty (change of) liver, not elsewhere classified: Secondary | ICD-10-CM | POA: Diagnosis not present

## 2016-10-15 DIAGNOSIS — N2882 Megaloureter: Secondary | ICD-10-CM | POA: Diagnosis not present

## 2016-10-15 DIAGNOSIS — R9341 Abnormal radiologic findings on diagnostic imaging of renal pelvis, ureter, or bladder: Secondary | ICD-10-CM | POA: Diagnosis not present

## 2016-10-15 DIAGNOSIS — N200 Calculus of kidney: Secondary | ICD-10-CM | POA: Diagnosis not present

## 2016-10-16 ENCOUNTER — Encounter: Payer: Self-pay | Admitting: Family

## 2016-10-16 ENCOUNTER — Ambulatory Visit (INDEPENDENT_AMBULATORY_CARE_PROVIDER_SITE_OTHER): Payer: Medicare Other | Admitting: Family

## 2016-10-16 ENCOUNTER — Ambulatory Visit (INDEPENDENT_AMBULATORY_CARE_PROVIDER_SITE_OTHER)
Admission: RE | Admit: 2016-10-16 | Discharge: 2016-10-16 | Disposition: A | Discharge status: Home / Self Care | Payer: Medicare Other | Source: Ambulatory Visit | Attending: Family | Admitting: Family

## 2016-10-16 VITALS — BP 124/80 | HR 63 | Temp 98.0°F | Resp 16 | Ht 70.0 in | Wt 175.8 lb

## 2016-10-16 DIAGNOSIS — M79671 Pain in right foot: Secondary | ICD-10-CM

## 2016-10-16 DIAGNOSIS — M7731 Calcaneal spur, right foot: Secondary | ICD-10-CM | POA: Diagnosis not present

## 2016-10-16 DIAGNOSIS — I25119 Atherosclerotic heart disease of native coronary artery with unspecified angina pectoris: Secondary | ICD-10-CM | POA: Diagnosis not present

## 2016-10-16 NOTE — Assessment & Plan Note (Signed)
Transverse metatarsal arch pain and located at the base of the 2nd metatarsal. Treat conservative with ice and good support with recommendation for Spenco orthotics. Refer to podiatry for further assessment and treatment. Consider toe-spacer to help with orientation of right second toe.

## 2016-10-16 NOTE — Patient Instructions (Addendum)
Thank you for choosing [facility].  SUMMARY AND INSTRUCTIONS:  Recommend ice x 20 minutes every other hour and following activity.  Try Spenco Total Support orthotics.  A referral to podiatry has been placed.   We will check your x-ray today.  Imaging / Radiology:  Please stop by radiology on the basement level of the building for your x-rays. Your results will be released to [place] (or called to you) after review, usually within 72 hours after test completion. If any treatments or changes are necessary, you will be notified at that same time.  Follow up:  If your symptoms worsen or fail to improve, please contact our office for further instruction, or in case of emergency go directly to the emergency room at the closest medical facility.

## 2016-10-16 NOTE — Progress Notes (Signed)
Subjective:    Patient ID: Andrew Joyce, male    DOB: 12/14/1960, 56 y.o.   MRN: [id number]  Chief Complaint  Patient presents with  . Feet pain    having bilateral feet pain, one of his toes are moving over the other one    HPI:  Andrew Joyce is a 56 y.o. male who  has a past medical history of Bladder exstrophy; Chronic kidney disease (CKD), stage II (mild); Coronary artery disease; Dyslipidemia; History of renal stone; cardiovascular stress test; echocardiogram; Hypertension; Pancreatitis; and Recurrent UTI. and presents today for an office visit.  This is a new problem. Associated symptom of pain located in his bilateral feet has been going on for . Describes that his second toe on the right side is moving medially over the great toe. Pain is described as sharp and primarily occurs when walking. Improves when seated and minimal pressure on the floor. Modifying factors include Advil which did not help very much. He has also tried orthotics similar to Dr. Fredderick Phenix which did not help very much. Pain worse after he starts walking.   No Known Allergies    Outpatient Medications Prior to Visit  Medication Sig Dispense Refill  . allopurinol (ZYLOPRIM) 100 MG tablet Take 1 tablet (100 mg total) by mouth daily. 30 tablet 6  . amLODipine (NORVASC) 5 MG tablet Take 1 tablet (5 mg total) by mouth daily. 30 tablet 6  . aspirin 81 MG tablet Take 1 tablet (81 mg total) by mouth daily.    . baclofen (LIORESAL) 10 MG tablet TAKE TWO TABLETS BY MOUTH IN THE MORNING, AND THEN TAKE ONE TABLET BY MOUTH AT LUNCHTIME, AND THEN TAKE THREE TABLETS BY MOUTH AT BEDTIME 180 tablet 11  . carbamazepine (TEGRETOL) 200 MG tablet Take 200 mg by mouth 3 (three) times daily.    . fenofibrate 160 MG tablet TAKE 1 TABLET BY MOUTH ONCE DAILY 90 tablet 1  . fish oil-omega-3 fatty acids 1000 MG capsule Take 2 capsules (2 g total) by mouth 2 (two) times daily.    . isosorbide mononitrate (IMDUR) 60 MG 24  hr tablet TAKE ONE & ONE-HALF TABLETS BY MOUTH ONCE DAILY 135 tablet 2  . levofloxacin (LEVAQUIN) 250 MG tablet Take 250 mg by mouth daily.  0  . levothyroxine (SYNTHROID, LEVOTHROID) 25 MCG tablet Take 1 tablet (25 mcg total) by mouth daily. 30 tablet 1  . losartan (COZAAR) 100 MG tablet TAKE 1 TABLET BY MOUTH ONCE DAILY 30 tablet 3  . methenamine (MANDELAMINE) 1 g tablet Take 1,000 mg by mouth 3 (three) times daily.     . metoprolol (LOPRESSOR) 50 MG tablet TAKE ONE TABLET BY MOUTH TWICE DAILY 60 tablet 10  . nitroGLYCERIN (NITROSTAT) 0.4 MG SL tablet Place 1 tablet (0.4 mg total) under the tongue every 5 (five) minutes as needed. For chest pain 25 tablet 3  . pravastatin (PRAVACHOL) 40 MG tablet Take 1 tablet (40 mg total) by mouth daily. 90 tablet 2  . venlafaxine XR (EFFEXOR-XR) 37.5 MG 24 hr capsule Take 1 capsule (37.5 mg total) by mouth daily with breakfast. 30 capsule 1  . diazepam (VALIUM) 5 MG tablet Take 0.5-1 tablets (2.5-5 mg total) by mouth at bedtime. 60 tablet 3  . pravastatin (PRAVACHOL) 40 MG tablet Take 1 tablet (40 mg total) by mouth daily. 90 tablet 1  . traMADol (ULTRAM) 50 MG tablet Take 1 tablet (50 mg total) by mouth every 12 (twelve) hours as needed.  30 tablet 5   No facility-administered medications prior to visit.       Past Surgical History:  Procedure Laterality Date  . CARDIAC CATHETERIZATION  07/28/2009   EF 60%; Grafts patent except for SVG to the AM and PD. Native RCA is occluded.  [name] CARDIAC CATHETERIZATION  03/20/2006   EF 60-65%  . CARDIOVASCULAR STRESS TEST  06/12/2009   EF 71%, SMALL AREA OF INFARCT/ISCHEMIA IN INFERIOR WALL; FELT TO NOT BE CHANGED.  [name] CORONARY ARTERY BYPASS GRAFT  03/2006   LIMA GRAFT TO LAD, SAPHENOUS VEIN GRAFT TO THE ACUTE MARGINAL BRANCH THE RIGHT CORONARY, SAPHENOUS VEIN GRAFT TO THE PDA, SEQUENTIAL VEIN GRAFT TO THE FIRST AND SECOND OBTUSE MARGINAL VESSELS  . LEFT HEART CATHETERIZATION WITH CORONARY ANGIOGRAM N/A 02/26/2011    Procedure: LEFT HEART CATHETERIZATION WITH CORONARY ANGIOGRAM;  Surgeon: Thayer Headings, MD;  Location: [facility] CATH LAB;  Service: Cardiovascular;  Laterality: N/A;      Past Medical History:  Diagnosis Date  . 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 Chronic kidney disease (CKD), stage II (mild)   . Coronary artery disease    a. s/p CABG in 2008; b.  s/p cath in 2011 for med rx.;  c.  NSTEMI (02/2011):  LHC (02/2011):  LAD occluded, distal LAD filled via left to left collaterals, distal CFX 40-50%, proximal RCA occluded (right to right collaterals), distal RCA occluded, S-RCA occluded proximally, S-OM1/OM2 occluded (new from 2011 - culprit), L-LAD ok, ant apical HK, EF 45-50% - med Rx.  [name] Dyslipidemia   . History of renal stone   . Hx of cardiovascular stress test    Nuclear (06/2009):  EF 71%, small area of infarct/ischemia in inferior wall-unchanged from prior  . Hx of echocardiogram    Echo (05/2013):  EF 55-60%, inf HK, mild AI, normal RVSF, RVSP 28 mmHg  . Hypertension   . Pancreatitis   . Recurrent UTI       Review of Systems  Constitutional: Negative for chills and fever.  Respiratory: Negative for chest tightness and shortness of breath.   Musculoskeletal:       Positive for right foot pain  Neurological: Negative for weakness and numbness.      Objective:    BP 124/80 (BP Location: Left Arm, Patient Position: Sitting, Cuff Size: Large)   Pulse 63   Temp 98 F (36.7 C) (Oral)   Resp 16   Ht 5\' 10"  (1.778 m)   Wt 175 lb 12.8 oz (79.7 kg)   SpO2 98%   BMI 25.22 kg/m  Nursing note and vital signs reviewed.  Physical Exam  Constitutional: He is oriented to person, place, and time. He appears well-developed and well-nourished. No distress.  Cardiovascular: Normal rate, regular rhythm, normal heart sounds and intact distal pulses.   Pulmonary/Chest: Effort normal and breath sounds normal.  Musculoskeletal:  Right foot - Right second toe  appears to be turning medially with no other obvious deformity, discoloration or edema. Tenderness of head of 2 metatarsal. Range of motion and strength are normal.   Neurological: He is alert and oriented to person, place, and time.  Skin: Skin is warm and dry.  Psychiatric: He has a normal mood and affect. His behavior is normal. Judgment and thought content normal.       Assessment & Plan:   Problem List Items Addressed This Visit      Other   Arch pain of right  foot - Primary    Transverse metatarsal arch pain and located at the base of the 2nd metatarsal. Treat conservative with ice and good support with recommendation for Spenco orthotics. Refer to podiatry for further assessment and treatment. Consider toe-spacer to help with orientation of right second toe.       Relevant Orders   DG Foot Complete Right   Ambulatory referral to Podiatry       I have discontinued Mr. Sherk diazepam and traMADol. I am also having him maintain his aspirin, fish oil-omega-3 fatty acids, amLODipine, baclofen, methenamine, metoprolol tartrate, isosorbide mononitrate, levofloxacin, nitroGLYCERIN, carbamazepine, levothyroxine, allopurinol, losartan, pravastatin, venlafaxine XR, and fenofibrate.   Follow-up: Return if symptoms worsen or fail to improve.  Mauricio Po, FNP

## 2016-10-18 ENCOUNTER — Encounter: Payer: Self-pay | Admitting: Family

## 2016-10-24 ENCOUNTER — Encounter: Payer: Self-pay | Admitting: Family Medicine

## 2016-10-24 ENCOUNTER — Ambulatory Visit (INDEPENDENT_AMBULATORY_CARE_PROVIDER_SITE_OTHER): Payer: Medicare Other | Admitting: Family Medicine

## 2016-10-24 DIAGNOSIS — I25119 Atherosclerotic heart disease of native coronary artery with unspecified angina pectoris: Secondary | ICD-10-CM | POA: Diagnosis not present

## 2016-10-24 DIAGNOSIS — G8929 Other chronic pain: Secondary | ICD-10-CM | POA: Diagnosis not present

## 2016-10-24 DIAGNOSIS — M546 Pain in thoracic spine: Secondary | ICD-10-CM

## 2016-10-24 NOTE — Assessment & Plan Note (Addendum)
Spent  25 minutes with patient face-to-face and had greater than 50% of counseling including as described in assessment and plan.Continues to have discomfort. We will continue the Effexor at the dose and we have at this time. Patient is seen pain management is scheduled for an MRI. We'll see if this shows any significant difference but I think it is highly unlikely. We discussed with him that we will not be prescribing any pain medication again. She never responded well to osteopathic manipulation. Discussed that we may have exhausted our options here at our practice that it seems like he is in good hands at this moment. Patient will consider coming back. We will discuss at any follow-up.

## 2016-10-24 NOTE — Patient Instructions (Signed)
Good to see you  Good shoes with rigid bottom.  Andrew Joyce, [place] or New balance greater then 700 Spenco orthotics "total support" online would be great  Avoid being barefoot.  Continue the effexor at the same dose  Lets see what the MRI shows Lets discuss after the MRI on the next steps

## 2016-10-24 NOTE — Progress Notes (Signed)
[name] [address], [place] [address] Phone: [phone] Subjective:    I'm seeing this patient by the request  of:    CC: Back pain follow-up  [id number]  Andrew Joyce is a 56 y.o. male coming in with complaint of back pain. Patient has had difficulty with this for multiple time. Patient has had more of a thoracic pain. We have tried multiple different medications over the course of time and there was a potential there for patient having tramadol for multiple different providers. We have discontinued prescribing. Patient was to do a Effexor and start to titrate off of this with him seen pain management and increasing his Lyrica. Patient states the trigger point injections were given last time did not make any significant improvement and if anything seemed to worsen after the first day. He said that he is still having the same pain, no better, no worse.       Past Medical History:  Diagnosis Date  . 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 Chronic kidney disease (CKD), stage II (mild)   . Coronary artery disease    a. s/p CABG in 2008; b.  s/p cath in 2011 for med rx.;  c.  NSTEMI (02/2011):  LHC (02/2011):  LAD occluded, distal LAD filled via left to left collaterals, distal CFX 40-50%, proximal RCA occluded (right to right collaterals), distal RCA occluded, S-RCA occluded proximally, S-OM1/OM2 occluded (new from 2011 - culprit), L-LAD ok, ant apical HK, EF 45-50% - med Rx.  [name] Dyslipidemia   . History of renal stone   . Hx of cardiovascular stress test    Nuclear (06/2009):  EF 71%, small area of infarct/ischemia in inferior wall-unchanged from prior  . Hx of echocardiogram    Echo (05/2013):  EF 55-60%, inf HK, mild AI, normal RVSF, RVSP 28 mmHg  . Hypertension   . Pancreatitis   . Recurrent UTI    Past Surgical History:  Procedure Laterality Date  . CARDIAC CATHETERIZATION  07/28/2009   EF 60%;  Grafts patent except for SVG to the AM and PD. Native RCA is occluded.  [name] CARDIAC CATHETERIZATION  03/20/2006   EF 60-65%  . CARDIOVASCULAR STRESS TEST  06/12/2009   EF 71%, SMALL AREA OF INFARCT/ISCHEMIA IN INFERIOR WALL; FELT TO NOT BE CHANGED.  [name] CORONARY ARTERY BYPASS GRAFT  03/2006   LIMA GRAFT TO LAD, SAPHENOUS VEIN GRAFT TO THE ACUTE MARGINAL BRANCH THE RIGHT CORONARY, SAPHENOUS VEIN GRAFT TO THE PDA, SEQUENTIAL VEIN GRAFT TO THE FIRST AND SECOND OBTUSE MARGINAL VESSELS  . LEFT HEART CATHETERIZATION WITH CORONARY ANGIOGRAM N/A 02/26/2011   Procedure: LEFT HEART CATHETERIZATION WITH CORONARY ANGIOGRAM;  Surgeon: Thayer Headings, MD;  Location: [facility] CATH LAB;  Service: Cardiovascular;  Laterality: N/A;   Social History   Social History  . Marital status: Married    Spouse name: N/A  . Number of children: N/A  . Years of education: N/A   Social History Main Topics  . Smoking status: Never Smoker  . Smokeless tobacco: Never Used  . Alcohol use No  . Drug use: No  . Sexual activity: Yes   Other Topics Concern  . None   Social History Narrative   Lives with wife and 2 children in a 2 story home.     Education: college.     Retired Physiological scientist.       No Known Allergies  Family History  Problem Relation Age of Onset  . Hyperlipidemia Mother        Living, 57  . Hypertension Mother   . Hyperlipidemia Father        Living 71  . Hypertension Father   . Healthy Sister   . Healthy Brother      Past medical history, social, surgical and family history all reviewed in electronic medical record.  No pertanent information unless stated regarding to the chief complaint.   Review of Systems:Review of systems updated and as accurate as of 10/24/16  No headache, visual changes, nausea, vomiting, diarrhea, constipation, dizziness, abdominal pain, skin rash, fevers, chills, night sweats, weight loss, swollen lymph nodes, body aches, joint swelling, chest pain, shortness of breath,  mood changes. Positive muscle aches  Objective  Blood pressure 122/84, pulse (!) 59, height 5\' 10"  (1.778 m), weight 178 lb (80.7 kg), SpO2 96 %. Systems examined below as of 10/24/16   General: No apparent distress alert and oriented x3 mood and affect normal, dressed appropriately.  HEENT: Pupils equal, extraocular movements intact  Respiratory: Patient's speak in full sentences and does not appear short of breath  Cardiovascular: No lower extremity edema, non tender, no erythema  Skin: Warm dry intact with no signs of infection or rash on extremities or on axial skeleton.  Abdomen: Soft nontender  Neuro: Cranial nerves II through XII are intact, neurovascularly intact in all extremities with 2+ DTRs and 2+ pulses.  Lymph: No lymphadenopathy of posterior or anterior cervical chain or axillae bilaterally.  Gait normal with good balance and coordination.  MSK:  Non tender with full range of motion and good stability and symmetric strength and tone of shoulders, elbows, wrist, hip, knee and ankles bilaterally.  Patient does have diffuse tenderness to palpation more of the thoracic spine. Possibly more than previously. Otherwise no significant findings.   Impression and Recommendations:     This case required medical decision making of moderate complexity.      Note: This dictation was prepared with Dragon dictation along with smaller phrase technology. Any transcriptional errors that result from this process are unintentional.

## 2016-10-29 ENCOUNTER — Ambulatory Visit (INDEPENDENT_AMBULATORY_CARE_PROVIDER_SITE_OTHER): Payer: Medicare Other | Admitting: Podiatry

## 2016-10-29 VITALS — BP 153/94 | HR 64 | Ht 70.0 in | Wt 175.0 lb

## 2016-10-29 DIAGNOSIS — M205X9 Other deformities of toe(s) (acquired), unspecified foot: Secondary | ICD-10-CM | POA: Diagnosis not present

## 2016-10-29 DIAGNOSIS — M2041 Other hammer toe(s) (acquired), right foot: Secondary | ICD-10-CM

## 2016-10-29 DIAGNOSIS — I25119 Atherosclerotic heart disease of native coronary artery with unspecified angina pectoris: Secondary | ICD-10-CM | POA: Diagnosis not present

## 2016-10-29 DIAGNOSIS — M779 Enthesopathy, unspecified: Secondary | ICD-10-CM

## 2016-10-29 MED ORDER — MELOXICAM 15 MG PO TABS: 15.0000 mg | ORAL_TABLET | Freq: Every day | ORAL | 1 refills | Status: AC

## 2016-10-29 NOTE — Progress Notes (Signed)
   Subjective:    Patient ID: Andrew Joyce, male    DOB: 10-12-1960, 56 y.o.   MRN: [id number]  HPI This patient presents to the office saying he has developed pain in his right forefoot and developed overlapping second toe right foot.  He says this has been occurring for over 4 months.  He says he has pain of 5 out of 10 in his right forefoot.  He says he also has occasional pain in left forefoot.  He denies history of trauma or injury of the foot.  He presents for evaluation of his right forefoot pain. Chief Complaint  Patient presents with  . Foot Pain    Right - x 3 or 4 months -ball of foot is painful on he bottom/left foot foot hurts sometime  . Toe Pain    right 2nd toe starting to overlap       Review of Systems  All other systems reviewed and are negative.      Objective:   Physical Exam General Appearance  Alert, conversant and in no acute stress.  Vascular  Dorsalis pedis and posterior pulses are palpable  bilaterally.  Capillary return is within normal limits  Bilaterally. Temperature is within normal limits  Bilaterally  Neurologic  Senn-Weinstein monofilament wire test within normal limits  bilaterally. Muscle power  Within normal limits bilaterally.  Nails Normal nails with no fungal or bacterial infection.  Orthopedic  No limitations of motion of motion feet bilaterally.  No crepitus or effusions noted.  Hallux limitus 1st MPJ  B/L. Dorsal lipping first metatarsal both feet. Overlapping second digit right foot.  Palpable pain sub 2nd metatarsal right foot. Functional hallux limitus noted.  Skin  normotropic skin with no porokeratosis noted bilaterally.  No signs of infections or ulcers noted.          Assessment & Plan:  Flexor plate injury 2nd  MPJ right foot.  Capsulitis 2nd MPJ  B/L.  Overlapping second digit right foot.   IE  Reviewed xrays which reveal flexor plate injury second  MPJ right foot.  Spurring right heel. Discussed treatment such as  NSAIDs, injection therapy, orthotic therapy and surgery.  Patient was surprised at this finding and chose to start treatment with NSAIDs.  He will return in 2 weeks for continued evaluation and treatment.   Gardiner Barefoot DPM

## 2016-11-01 ENCOUNTER — Ambulatory Visit (INDEPENDENT_AMBULATORY_CARE_PROVIDER_SITE_OTHER): Payer: Medicare Other | Admitting: Neurology

## 2016-11-01 ENCOUNTER — Encounter: Payer: Self-pay | Admitting: Neurology

## 2016-11-01 ENCOUNTER — Other Ambulatory Visit: Payer: Self-pay | Admitting: Anesthesiology

## 2016-11-01 VITALS — BP 130/84 | HR 71 | Ht 70.0 in | Wt 176.2 lb

## 2016-11-01 DIAGNOSIS — G2581 Restless legs syndrome: Secondary | ICD-10-CM | POA: Diagnosis not present

## 2016-11-01 DIAGNOSIS — I25119 Atherosclerotic heart disease of native coronary artery with unspecified angina pectoris: Secondary | ICD-10-CM | POA: Diagnosis not present

## 2016-11-01 DIAGNOSIS — R253 Fasciculation: Secondary | ICD-10-CM | POA: Diagnosis not present

## 2016-11-01 DIAGNOSIS — M898X1 Other specified disorders of bone, shoulder: Principal | ICD-10-CM

## 2016-11-01 DIAGNOSIS — G8929 Other chronic pain: Secondary | ICD-10-CM

## 2016-11-01 MED ORDER — ROPINIROLE HCL 0.25 MG PO TABS: ORAL_TABLET | ORAL | 5 refills | Status: DC

## 2016-11-01 NOTE — Progress Notes (Signed)
Follow-up Visit   Date: 11/01/16    Andrew Joyce MRN: [id number] DOB: 1960/04/17   Interim History: Andrew Joyce is a 56 y.o. left-handed Caucasian male with congenital exstrophy status post multiple surgeries complicated by chronic UTI, CAD s/p CABG (2008), hyperlipidemia, hypertension, and stage II CKD returning to the clinic for follow-up of cramp-fasciculation syndrome.  History of present illness: Starting in October 2015, he developed "firing nerves" and cramping of the left legs. Symptoms are worse at night and keep him from sleeping because of painful cramps. They usually last several minutes. He has no associated weakness or similar symptoms on the right leg or arms. He went to see his PCP who recommended melatonin, muscle relaxants (robaxin), ambien, and heat packs. He also tried taking valium at night which did not help much. Since starting baclofen the agitation of the leg and cramps improved.  His left leg weakness remains unchanged.  In 2016, he noticed new twitches involving his right lower leg, but no associated weakness.  He also complains of severe right mid-back pain, worse with any type of activity. MRI thoracic spine did not show any nerve impingement.   He was referred to Dr. Tamala Julian who recommended exercises and steroid injection, but there was no lasting benefit.    UPDATE 05/18/2015:  He complains of increased painful muscle cramps affecting the left posterior calf, lasting up to 15-minutes and occuring 1-2 times daily. He always has to stop what he is doing and wait until it relaxes, but the pain can be severe.  He has not been exercising as much because of concern that it may trigger the leg to cramp.  He has seen a number of physicians for his mid thoracic pain and unfortunately has not been able to achieve any significant pain relief.    UPDATE 08/22/2015:  At his last visit, I started diazepam 2.53m BID for his cramps, but he has not noticed any  significant change.  He is still having severe and painful cramps of the legs and also noticed new fasciculations over the right lower leg.  No new weakness of the extremities.  He is working with Dr. STamala Julianto get better pain relief for his scapula pain. He is still exercising daily and able to lift the same weight as previously.  UPDATE 12/29/2015:  Starting around the fall of 2017, he began having spells of restlessness of the of the left leg exclusively when he is laying in bed.  He often has to get up and walk to relieve the discomfort.  Symptoms occur about 3-4 times per month and last a few days.  His cramps are better controlled on a combination of baclofen 277mTID, carbamazepine 20037mID, diazepam 2.5mg56ms, and tramadol 50mg42m.  He denies any new weakness.  Notable interval history also includes a recent diagnosis of basal cell carcinoma of the right forehead, which has been resection.    UPDATE 07/08/2016:   He is here for 6 month appointment.  He feels like there is very mild worsening symptoms of muscle twitches, cramps, and restless sensation of the lower legs, worse on the left.  He tried sinemet but developed nausea and did not appreciate marked benefit, so has not been taking this.   No new weakness, falls, or hospitalizations.    UPDATE 11/01/2016: Patient is here for 6-mon66-monthw-up appointment.  He reports having worsening restless leg and inability to sleep after being taken off tramadol by his providers.  His pharmacy notified me that he was receiving tramadol from before providers, including myself.  I discontinued in the future prescriptions for tramadol and diazepam, and he was encouraged to establish care with pain management which he has done.  He is being treated with Lyrica for his pain, but he reports he does not get adequate relief and is frustrated.  He feels that his right arm bothers him more at night and may be slightly weaker.  There has been no new weakness of his  legs.  He continues to take carbamazepine 200 mg twice daily and baclofen 20 mg 3 times daily for his cramps.  Medications:  Current Outpatient Prescriptions on File Prior to Visit  Medication Sig Dispense Refill  . allopurinol (ZYLOPRIM) 100 MG tablet Take 1 tablet (100 mg total) by mouth daily. 30 tablet 6  . amLODipine (NORVASC) 5 MG tablet Take 1 tablet (5 mg total) by mouth daily. 30 tablet 6  . aspirin 81 MG tablet Take 1 tablet (81 mg total) by mouth daily.    . baclofen (LIORESAL) 10 MG tablet TAKE TWO TABLETS BY MOUTH IN THE MORNING, AND THEN TAKE ONE TABLET BY MOUTH AT LUNCHTIME, AND THEN TAKE THREE TABLETS BY MOUTH AT BEDTIME 180 tablet 11  . carbamazepine (TEGRETOL) 200 MG tablet Take 200 mg by mouth 3 (three) times daily.    . fenofibrate 160 MG tablet TAKE 1 TABLET BY MOUTH ONCE DAILY 90 tablet 1  . fish oil-omega-3 fatty acids 1000 MG capsule Take 2 capsules (2 g total) by mouth 2 (two) times daily.    . isosorbide mononitrate (IMDUR) 60 MG 24 hr tablet TAKE ONE & ONE-HALF TABLETS BY MOUTH ONCE DAILY 135 tablet 2  . levothyroxine (SYNTHROID, LEVOTHROID) 25 MCG tablet Take 1 tablet (25 mcg total) by mouth daily. 30 tablet 1  . losartan (COZAAR) 100 MG tablet TAKE 1 TABLET BY MOUTH ONCE DAILY 30 tablet 3  . meloxicam (MOBIC) 15 MG tablet Take 1 tablet (15 mg total) by mouth daily. 30 tablet 1  . methenamine (MANDELAMINE) 1 g tablet Take 1,000 mg by mouth 3 (three) times daily.     . metoprolol (LOPRESSOR) 50 MG tablet TAKE ONE TABLET BY MOUTH TWICE DAILY 60 tablet 10  . nitroGLYCERIN (NITROSTAT) 0.4 MG SL tablet Place 1 tablet (0.4 mg total) under the tongue every 5 (five) minutes as needed. For chest pain 25 tablet 3  . pravastatin (PRAVACHOL) 40 MG tablet Take 1 tablet (40 mg total) by mouth daily. 90 tablet 2  . pregabalin (LYRICA) 100 MG capsule Take 100 mg by mouth 3 (three) times daily.    [name] venlafaxine XR (EFFEXOR-XR) 37.5 MG 24 hr capsule Take 1 capsule (37.5 mg total) by  mouth daily with breakfast. 30 capsule 1   No current facility-administered medications on file prior to visit.     Allergies: No Known Allergies  Review of Systems:  CONSTITUTIONAL: No fevers, chills, night sweats, or weight loss.  EYES: No visual changes or eye pain ENT: No hearing changes.  No history of nose bleeds.   RESPIRATORY: No cough, wheezing and shortness of breath.   CARDIOVASCULAR: Negative for chest pain, and palpitations.   GI: Negative for abdominal discomfort, blood in stools or black stools.  No recent change in bowel habits.   GU:  No history of incontinence.   MUSCLOSKELETAL: No history of joint pain or swelling.  +cramps.   SKIN: Negative for lesions, rash, and itching.   ENDOCRINE:  Negative for cold or heat intolerance, polydipsia or goiter.   PSYCH:  No depression or anxiety symptoms.   NEURO: As Above.   Vital Signs:  BP 130/84   Pulse 71   Ht 5' 10"  (1.778 m)   Wt 176 lb 4 oz (79.9 kg)   SpO2 97%   BMI 25.29 kg/m   Neurological Exam: MENTAL STATUS including orientation to time, place, person, recent and remote memory, attention span and concentration, language, and fund of knowledge is normal.  Speech is not dysarthric.  CRANIAL NERVES:  Face is symmetric.   MOTOR:  Mild atrophy of the left lower leg (quads, medial gastroc). Prominent and active fasciculations of the left > right calf and left thigh. Normal tone.  Motor strength is 5/5 throughout.  MSRs: Reflexes are 1+ throughout, except 2+ patellar jerks bilaterally.  COORDINATION/GAIT:   Gait narrow based and stable.   Data: EMG 12/15/2013: 1. Chronic C6 radiculopathy affecting the left upper extremity. 2. Left ulnar neuropathy with slowing across the elbow, purely demyelinating in type. 3. Multilevel intraspinal canal lesion affecting L2-S1 myotomes bilaterally. These findings are moderate in degree electrically and worse on the left side involving the L2-L4 myotome. Active changes are also  seen affecting the medial gastrocnemius muscle. These findings are insufficient for the diagnosis of motor neuron disease, recommend repeat electrodiagnostic testing in 6 months if clinically indicated.  Labs 07/14/2014:  CK 252*, vitamin D 22* Labs 11/25/2013:  CK 215, aldolase 9.2*, PTH 34, SPEP/UPEP with IFE no Mo protein, copper 116, zinc 67, CRP 0.6*, ESR 47  CT lumbar spine 12/17/2013: 1. Severe degenerative disc disease with disc height loss at L5-S1 with a mild broad-based disc osteophyte complex and right foraminal narrowing. 2. Mild broad-based disc bulge at L4-5 without foraminal or central canal stenosis.  MRI thoracic spine wo contrast 05/29/2014: Small central disc protrusions T3-4 and T7-8 without neural impingement or significant spinal stenosis. No acute abnormality.  MRI lumbar spine wo contrast 05/29/2014:  Mild to moderate spinal stenosis at L4-5 with small central disc protrusion and diffuse disc bulging and spondylosis.  Postop laminectomy left L5-S1 without recurrent disc protrusion. There is spondylosis causing subarticular and foraminal stenosis bilaterally at L5-S1.  Lab Results  Component Value Date   FERRITIN 180.0 12/29/2015     IMPRESSION/PLAN: 1.  Cramp fasciculation syndrome, stable.  Initially there was concern that he may have lower motor neuron disease because he presented with weakness of the left leg and EMG showed active on chronic motor axonal loss changes affecting the L2-S1 myotomes bilaterally.  However, his weakness has resolved and he has not developed any new progressive neurological symptoms.  His exam continues to show active left leg fasciculations, without evidence of weakness.   - Continue carbamazepine 200 mg twice daily baclofen 200 mg 3 times daily for cramps.   -I was previously prescribing tramadol for pain associated with cramps, however his pharmacy had notified my office that he is receiving tramadol from 3 other providers. Patient was  informed that my office will not prescribe controlled substances, should he need tramadol or diazepam going forward, he would need to discuss this with his pain management providers.  2.  Restless leg syndrome, worsening involving the arms in addition to the legs.  Ferritin is normal.  He has previously tried sinemet (nausea)  -Start Requip 0.25 mg 3 hours before bedtime and titrate as needed   Return to clinic in 6 months, or sooner as needed  Greater than  50% of this 25 minute visit was spent in counseling, explanation of diagnosis, planning of further management, and coordination of care.   Thank you for allowing me to participate in patient's care.  If I can answer any additional questions, I would be pleased to do so.    Sincerely,    Baylynn Shifflett K. Posey Pronto, DO

## 2016-11-01 NOTE — Patient Instructions (Signed)
Start requip 0.25mg  3 hours before bedtime for restless sensation  Continue carbamazepine and baclofen as you are taking  Return to clinic in 6 months

## 2016-11-08 ENCOUNTER — Telehealth: Payer: Self-pay | Admitting: Cardiology

## 2016-11-08 ENCOUNTER — Other Ambulatory Visit: Payer: Self-pay | Admitting: *Deleted

## 2016-11-08 MED ORDER — LOSARTAN POTASSIUM 100 MG PO TABS: 100.0000 mg | ORAL_TABLET | Freq: Every day | ORAL | 1 refills | Status: DC

## 2016-11-08 NOTE — Telephone Encounter (Signed)
New message     *STAT* If patient is at the pharmacy, call can be transferred to refill team.   1. Which medications need to be refilled? (please list name of each medication and dose if known) losartan 100 mg  2. Which pharmacy/location (including street and city if local pharmacy) is medication to be sent to? [facility] on [address]  3. Do they need a 30 day or 90 day supply? 30 day

## 2016-11-13 ENCOUNTER — Ambulatory Visit
Admission: RE | Admit: 2016-11-13 | Discharge: 2016-11-13 | Disposition: A | Discharge status: Home / Self Care | Payer: Medicare Other | Source: Ambulatory Visit | Attending: Anesthesiology | Admitting: Anesthesiology

## 2016-11-13 DIAGNOSIS — M25511 Pain in right shoulder: Secondary | ICD-10-CM | POA: Diagnosis not present

## 2016-11-13 DIAGNOSIS — G8929 Other chronic pain: Secondary | ICD-10-CM

## 2016-11-13 DIAGNOSIS — M898X1 Other specified disorders of bone, shoulder: Principal | ICD-10-CM

## 2016-11-18 ENCOUNTER — Ambulatory Visit: Payer: Medicare Other | Admitting: Nurse Practitioner

## 2016-11-20 ENCOUNTER — Ambulatory Visit: Payer: Medicare Other | Admitting: Podiatry

## 2016-11-26 ENCOUNTER — Encounter: Payer: Self-pay | Admitting: Podiatry

## 2016-11-26 ENCOUNTER — Ambulatory Visit (INDEPENDENT_AMBULATORY_CARE_PROVIDER_SITE_OTHER): Payer: Medicare Other | Admitting: Podiatry

## 2016-11-26 DIAGNOSIS — M2041 Other hammer toe(s) (acquired), right foot: Secondary | ICD-10-CM

## 2016-11-26 DIAGNOSIS — M779 Enthesopathy, unspecified: Secondary | ICD-10-CM | POA: Diagnosis not present

## 2016-11-26 DIAGNOSIS — I25119 Atherosclerotic heart disease of native coronary artery with unspecified angina pectoris: Secondary | ICD-10-CM | POA: Diagnosis not present

## 2016-11-26 NOTE — Progress Notes (Signed)
This patient presents the office follow-up for diagnosis of a capsulitis right forefoot and hammertoe right forefoot.  These developed due to the fact he has a flexor plate injury.  He was treated with Mobic and told to return to the officefor continued evaluation and treatment.  He says he went to [employer] a pair of orthotics and has worn them faithfully since his last visit.  He says he has minimal pain in his right forefoot.  He is very pleased with his over-the-counter insoles, but did not bring them to the office today for me to examine.  He presents the office today for an evaluation and treatment of his right forefoot.  General Appearance  Alert, conversant and in no acute stress.  Vascular  Dorsalis pedis and posterior pulses are palpable  bilaterally.  Capillary return is within normal limits  Bilaterally. Temperature is within normal limits  Bilaterally  Neurologic  Senn-Weinstein monofilament wire test within normal limits  bilaterally. Muscle power  Within normal limits bilaterally.  Nails Normal nails noted with no evidence of fungal or bacterial infection.  Orthopedic  No limitations of motion of motion feet bilaterally.  No crepitus or effusions noted.  No bony pathology or digital deformities noted.hallux limitus first MPJ bilateral overlapping second digit right foot.  No palpable pain noted sub-2 of the right foot.   Skin  normotropic skin with no porokeratosis noted bilaterally.  No signs of infections or ulcers noted.    Capsulitis 2nd MPJ right.  Hammer toe right foot  ROV.  Patient is pleased  he is not experiencing any pain or discomfort and says he will return to the office if the problem occurs again. Discussed orthotic therapy with this patient and told him to return to the office if he wants to proceed.   Gardiner Barefoot DPM

## 2016-12-02 DIAGNOSIS — G894 Chronic pain syndrome: Secondary | ICD-10-CM | POA: Diagnosis not present

## 2016-12-02 DIAGNOSIS — N50819 Testicular pain, unspecified: Secondary | ICD-10-CM | POA: Diagnosis not present

## 2016-12-02 DIAGNOSIS — R109 Unspecified abdominal pain: Secondary | ICD-10-CM | POA: Diagnosis not present

## 2016-12-02 DIAGNOSIS — G8929 Other chronic pain: Secondary | ICD-10-CM | POA: Diagnosis not present

## 2016-12-02 DIAGNOSIS — M898X1 Other specified disorders of bone, shoulder: Secondary | ICD-10-CM | POA: Diagnosis not present

## 2016-12-03 DIAGNOSIS — F4323 Adjustment disorder with mixed anxiety and depressed mood: Secondary | ICD-10-CM | POA: Diagnosis not present

## 2016-12-18 ENCOUNTER — Other Ambulatory Visit: Payer: Self-pay | Admitting: Neurology

## 2017-01-08 ENCOUNTER — Ambulatory Visit: Payer: Medicare Other | Admitting: Neurology

## 2017-01-16 ENCOUNTER — Other Ambulatory Visit: Payer: Self-pay | Admitting: Neurology

## 2017-01-16 NOTE — Telephone Encounter (Signed)
Please advise 

## 2017-01-21 ENCOUNTER — Encounter: Payer: Self-pay | Admitting: Nurse Practitioner

## 2017-01-21 ENCOUNTER — Ambulatory Visit (INDEPENDENT_AMBULATORY_CARE_PROVIDER_SITE_OTHER): Payer: Medicare Other | Admitting: Nurse Practitioner

## 2017-01-21 VITALS — BP 130/78 | HR 61 | Temp 98.4°F | Resp 16 | Ht 70.0 in | Wt 178.8 lb

## 2017-01-21 DIAGNOSIS — Z1211 Encounter for screening for malignant neoplasm of colon: Secondary | ICD-10-CM | POA: Diagnosis not present

## 2017-01-21 DIAGNOSIS — Z7189 Other specified counseling: Secondary | ICD-10-CM | POA: Insufficient documentation

## 2017-01-21 DIAGNOSIS — Z0000 Encounter for general adult medical examination without abnormal findings: Secondary | ICD-10-CM

## 2017-01-21 NOTE — Patient Instructions (Addendum)
You will be contacted about the cologuard test. If you have not heard anything in about 2 weeks please let me know.  If you have medicare related insurance (such as traditional Medicare, [employer], [employer], or similar), Please make an appointment at the scheduling desk with Sharee Pimple, the [employer], for your Wellness visit in this office, which is a benefit with your insurance.  Ill see you back in about 1 years, or sooner if you need me.  It was nice to meet you. Thanks for letting me take care of you today :)  Colorectal Cancer Screening Colorectal cancer screening is a group of tests used to check for colorectal cancer. Colorectal refers to your colon and rectum. Your colon and rectum are located at the end of your large intestine and carry your bowel movements out of your body. Why is colorectal cancer screening done? It is common for abnormal growths (polyps) to form in the lining of your colon, especially as you get older. These polyps can be cancerous or become cancerous. If colorectal cancer is found at an early stage, it is treatable. Who should be screened for colorectal cancer? Screening is recommended for all adults at average risk starting at age 48. Tests may be recommended every 1 to 10 years. Your health care provider may recommend earlier or more frequent screening if you have:  A history of colorectal cancer or polyps.  A family member with a history of colorectal cancer or polyps.  Inflammatory bowel disease, such as ulcerative colitis or Crohn disease.  A type of hereditary colon cancer syndrome.  Colorectal cancer symptoms.  Types of screening tests There are several types of colorectal screening tests. They include:  Guaiac-based fecal occult blood testing.  Fecal immunochemical test (FIT).  Stool DNA test.  Barium enema.  Virtual colonoscopy.  Sigmoidoscopy. During this test, a sigmoidoscope is used to examine your  rectum and lower colon. A sigmoidoscope is a flexible tube with a camera that is inserted through your anus into your rectum and lower colon.  Colonoscopy. During this test, a colonoscope is used to examine your entire colon. A colonoscope is a long, thin, flexible tube with a camera. This test examines your entire colon and rectum.  This information is not intended to replace advice given to you by your health care provider. Make sure you discuss any questions you have with your health care provider. Document Released: 06/13/2009 Document Revised: 08/03/2015 Document Reviewed: 04/01/2013 Elsevier Interactive Patient Education  [employer].

## 2017-01-21 NOTE — Progress Notes (Signed)
Name: Andrew Joyce   MRN: [id number]    DOB: 06/11/1960   Date:01/21/2017       Progress Note  Subjective  Chief Complaint  Chief Complaint  Patient presents with  . Establish Care    HPI [name] presents today to establish care. He does not have any complaints.  He follows with cardiology for management of Coronary artery disease, hypertension, hyperlipidemia-his next appointment is 1/24; neurology for management of restless legs- his next appointment in april, Dr Dalhstedt and [facility] specialist for chronic kidney stones and urinary symptoms, [facility] pain [facility] clinic for adjustment disorder and chronic pain syndrome. He lives at home independently and says that he overall feels well today.  Health maintenance-  Immunizations are up to date- he declines flu shot Declines HIV and Hep C screening He would like colon cancer screening today. Denies personal or family history of colon cancer or colon polyps, dark or tarry stools, changes in bowel movements.  Patient Active Problem List   Diagnosis Date Noted  . Arch pain of right foot 10/16/2016  . Trigger point of right shoulder region 09/12/2016  . Skin lesion of scalp 09/21/2015  . Skin lesion of chest wall 09/21/2015  . Benign fasciculation-cramp syndrome 08/22/2015  . Chronic thoracic back pain 03/20/2015  . Impacted cerumen of left ear 02/02/2015  . Fasciculation of lower extremity 11/18/2014  . Bilateral thoracic back pain 09/28/2014  . Scapular dysfunction 07/14/2014  . Nonallopathic lesion-rib cage 07/14/2014  . Nonallopathic lesion of thoracic region 07/14/2014  . Nonallopathic lesion of cervical region 07/14/2014  . Posture imbalance 04/13/2014  . Muscle spasm of calf 10/15/2013  . CKD (chronic kidney disease) stage 2, GFR 60-89 ml/min 06/01/2013  . Angina pectoris ([place]) 09/18/2011  . Dyspnea 06/13/2011  . NSTEMI (non-ST elevated myocardial infarction) ([place]) 02/27/2011  . CAD (coronary artery disease)  02/06/2011  . HTN (hypertension) 02/06/2011  . Hyperlipidemia 02/06/2011    Past Surgical History:  Procedure Laterality Date  . CARDIAC CATHETERIZATION  07/28/2009   EF 60%; Grafts patent except for SVG to the AM and PD. Native RCA is occluded.  [name] CARDIAC CATHETERIZATION  03/20/2006   EF 60-65%  . CARDIOVASCULAR STRESS TEST  06/12/2009   EF 71%, SMALL AREA OF INFARCT/ISCHEMIA IN INFERIOR WALL; FELT TO NOT BE CHANGED.  [name] CORONARY ARTERY BYPASS GRAFT  03/2006   LIMA GRAFT TO LAD, SAPHENOUS VEIN GRAFT TO THE ACUTE MARGINAL BRANCH THE RIGHT CORONARY, SAPHENOUS VEIN GRAFT TO THE PDA, SEQUENTIAL VEIN GRAFT TO THE FIRST AND SECOND OBTUSE MARGINAL VESSELS  . LEFT HEART CATHETERIZATION WITH CORONARY ANGIOGRAM N/A 02/26/2011   Procedure: LEFT HEART CATHETERIZATION WITH CORONARY ANGIOGRAM;  Surgeon: Thayer Headings, MD;  [facility]: [facility] CATH LAB;  Service: Cardiovascular;  Laterality: N/A;    Family History  Problem Relation Age of Onset  . Hyperlipidemia Mother        Living, 37  . Hypertension Mother   . Hyperlipidemia Father        Living 60  . Hypertension Father   . Healthy Sister   . Healthy Brother     Social History   Socioeconomic History  . Marital status: Married    Spouse name: Not on file  . Number of children: Not on file  . Years of education: Not on file  . Highest education level: Not on file  Social Needs  . Financial resource strain: Not on file  . Food insecurity - worry: Not on file  .  Food insecurity - inability: Not on file  . Transportation needs - medical: Not on file  . Transportation needs - non-medical: Not on file  Occupational History  . Not on file  Tobacco Use  . Smoking status: Never Smoker  . Smokeless tobacco: Never Used  Substance and Sexual Activity  . Alcohol use: No    Alcohol/week: 0.0 oz  . Drug use: No  . Sexual activity: Yes  Other Topics Concern  . Not on file  Social History Narrative   Lives with wife and 2 children in a 2 story  home.     Education: college.     Retired Physiological scientist.      Current Outpatient Medications:  .  allopurinol (ZYLOPRIM) 100 MG tablet, Take 1 tablet (100 mg total) by mouth daily., Disp: 30 tablet, Rfl: 6 .  amLODipine (NORVASC) 5 MG tablet, Take 1 tablet (5 mg total) by mouth daily., Disp: 30 tablet, Rfl: 6 .  aspirin 81 MG tablet, Take 1 tablet (81 mg total) by mouth daily., Disp: , Rfl:  .  baclofen (LIORESAL) 10 MG tablet, Take 2 tablets (20 mg total) by mouth 3 (three) times daily., Disp: 180 tablet, Rfl: 11 .  carbamazepine (TEGRETOL) 200 MG tablet, Take 200 mg by mouth 3 (three) times daily., Disp: , Rfl:  .  EPITOL 200 MG tablet, TAKE 1 TABLET BY MOUTH TWICE DAILY, Disp: 60 tablet, Rfl: 3 .  fenofibrate 160 MG tablet, TAKE 1 TABLET BY MOUTH ONCE DAILY, Disp: 90 tablet, Rfl: 1 .  fish oil-omega-3 fatty acids 1000 MG capsule, Take 2 capsules (2 g total) by mouth 2 (two) times daily., Disp: , Rfl:  .  isosorbide mononitrate (IMDUR) 60 MG 24 hr tablet, TAKE ONE & ONE-HALF TABLETS BY MOUTH ONCE DAILY, Disp: 135 tablet, Rfl: 2 .  levothyroxine (SYNTHROID, LEVOTHROID) 25 MCG tablet, Take 1 tablet (25 mcg total) by mouth daily., Disp: 30 tablet, Rfl: 1 .  losartan (COZAAR) 100 MG tablet, Take 1 tablet (100 mg total) by mouth daily., Disp: 90 tablet, Rfl: 1 .  meloxicam (MOBIC) 15 MG tablet, Take 1 tablet (15 mg total) by mouth daily., Disp: 30 tablet, Rfl: 1 .  methenamine (MANDELAMINE) 1 g tablet, Take 1,000 mg by mouth 3 (three) times daily. , Disp: , Rfl:  .  metoprolol (LOPRESSOR) 50 MG tablet, TAKE ONE TABLET BY MOUTH TWICE DAILY, Disp: 60 tablet, Rfl: 10 .  nitroGLYCERIN (NITROSTAT) 0.4 MG SL tablet, Place 1 tablet (0.4 mg total) under the tongue every 5 (five) minutes as needed. For chest pain, Disp: 25 tablet, Rfl: 3 .  pravastatin (PRAVACHOL) 40 MG tablet, Take 1 tablet (40 mg total) by mouth daily., Disp: 90 tablet, Rfl: 2 .  pregabalin (LYRICA) 100 MG capsule, Take 100 mg by  mouth 3 (three) times daily., Disp: , Rfl:  .  rOPINIRole (REQUIP) 0.25 MG tablet, Take 1 tablet 3 hours before bedtime, Disp: 30 tablet, Rfl: 5 .  venlafaxine XR (EFFEXOR-XR) 37.5 MG 24 hr capsule, Take 1 capsule (37.5 mg total) by mouth daily with breakfast., Disp: 30 capsule, Rfl: 1  No Known Allergies   Review of Systems  Constitutional: Negative for chills, fever and weight loss.  HENT: Negative for hearing loss and sore throat.   Eyes: Negative for blurred vision and double vision.  Respiratory: Negative for cough.   Cardiovascular: Negative for chest pain and palpitations.  Gastrointestinal: Negative for constipation, diarrhea and heartburn.  Genitourinary: Positive for dysuria and  frequency.  Musculoskeletal: Negative for myalgias.  Skin: Negative for rash.  Neurological: Negative for dizziness and weakness.  Endo/Heme/Allergies: Does not bruise/bleed easily.  Psychiatric/Behavioral: Negative for depression. The patient is not nervous/anxious.     Objective  Vitals:   01/21/17 1001  BP: 130/78  Pulse: 61  Resp: 16  Temp: 98.4 F (36.9 C)  TempSrc: Oral  SpO2: 98%  Weight: 178 lb 12.8 oz (81.1 kg)  Height: 5\' 10"  (1.778 m)    Body mass index is 25.66 kg/m.  Physical Exam  Constitutional: He is oriented to person, place, and time and well-developed, well-nourished, and in no distress. No distress.  HENT:  Head: Normocephalic and atraumatic.  Neck: Normal range of motion. Neck supple. No tracheal deviation present. No thyromegaly present.  Cardiovascular: Normal rate, regular rhythm, normal heart sounds and intact distal pulses.  Pulmonary/Chest: Effort normal and breath sounds normal. No respiratory distress.  Abdominal: Soft. Bowel sounds are normal. He exhibits no distension. There is no hepatosplenomegaly. There is no tenderness.  Musculoskeletal: Normal range of motion. He exhibits no edema.  Neurological: He is alert and oriented to person, place, and time.  Gait normal. Coordination normal.  Skin: Skin is warm and dry.  Psychiatric: Mood, affect and judgment normal.  Vitals reviewed.   Fall Risk: Fall Risk  11/01/2016 10/16/2016 07/08/2016 12/29/2015 08/22/2015  Falls in the past year? No No No No No    Assessment & Plan He will continue follow up with cardiology, neurology, urology and pain management. He declines any lab work today, he says he will get lab work at [facility] with [facility]. RTC in 1 year, or sooner if needed.

## 2017-01-21 NOTE — Assessment & Plan Note (Signed)
Healthcare maintenance Immunizations up to date- declines influenza vaccine Declines Hep C and HIV screenings. He is interested in colon cancer screening today- we discussed options including colonoscopy, cologuard, FOBT. He is interested in cologuard.  Screening for colon cancer - Cologuard

## 2017-01-27 NOTE — Progress Notes (Signed)
Cardiology Office Note   Date:  01/30/2017   ID:  Andrew Joyce, DOB September 29, 1960, MRN [id number]  PCP:  [name], NP  Cardiologist:  Dr. Kristel Durkee Martinique      History of Present Illness: Andrew Joyce is a 57 y.o. male seen for follow up CAD. He has a hx of CAD s/p CABG in 2008, HTN, HL, CKD.  He suffered a non-STEMI in February 2013. Culprit was felt to be an occluded SVG-OM1/OM2. Native circumflex had 40-50% distal stenosis. There were no targets for revascularization and he was treated medically.   He has a history orthostatic intolerance. Imdur dose was reduced.  Echo May 2015 demonstrated EF 55-60%, mild inferior hypokinesis, top normal LA/RA size.Poorly visualized aortic valve, however, there is mild central AI - no stenosis.    On follow up today he is doing well from a cardiac standpoint. He has rare chest pain - really hasn't used Ntg recently.  He stays active- mainly with weight lifting. Works out every other day.  He does have recurrent UTIs and renal stones followed by urology. He has frequent calculi passing multiple stones a month. He has severe hypertriglyceridemia.    Recent Labs: No results found for requested labs within last 8760 hours.  Wt Readings from Last 3 Encounters:  01/30/17 178 lb (80.7 kg)  01/21/17 178 lb 12.8 oz (81.1 kg)  11/01/16 176 lb 4 oz (79.9 kg)     Past Medical History:  Diagnosis Date  . 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 Chronic kidney disease (CKD), stage II (mild)   . Congenital birth defect   . Coronary artery disease    a. s/p CABG in 2008; b.  s/p cath in 2011 for med rx.;  c.  NSTEMI (02/2011):  LHC (02/2011):  LAD occluded, distal LAD filled via left to left collaterals, distal CFX 40-50%, proximal RCA occluded (right to right collaterals), distal RCA occluded, S-RCA occluded proximally, S-OM1/OM2 occluded (new from 2011 - culprit), L-LAD ok, ant apical HK, EF 45-50% - med  Rx.  [name] Dyslipidemia   . History of renal stone   . Hx of cardiovascular stress test    Nuclear (06/2009):  EF 71%, small area of infarct/ischemia in inferior wall-unchanged from prior  . Hx of echocardiogram    Echo (05/2013):  EF 55-60%, inf HK, mild AI, normal RVSF, RVSP 28 mmHg  . Hypertension   . Pancreatitis   . Recurrent UTI     Current Outpatient Medications  Medication Sig Dispense Refill  . allopurinol (ZYLOPRIM) 100 MG tablet Take 1 tablet (100 mg total) by mouth daily. 30 tablet 6  . amLODipine (NORVASC) 5 MG tablet Take 1 tablet (5 mg total) by mouth daily. 30 tablet 6  . aspirin 81 MG tablet Take 1 tablet (81 mg total) by mouth daily.    . baclofen (LIORESAL) 10 MG tablet Take 2 tablets (20 mg total) by mouth 3 (three) times daily. 180 tablet 11  . carbamazepine (TEGRETOL) 200 MG tablet Take 200 mg by mouth 3 (three) times daily.    . EPITOL 200 MG tablet TAKE 1 TABLET BY MOUTH TWICE DAILY 60 tablet 3  . fenofibrate 160 MG tablet TAKE 1 TABLET BY MOUTH ONCE DAILY 90 tablet 1  . fish oil-omega-3 fatty acids 1000 MG capsule Take 2 capsules (2 g total) by mouth 2 (two) times daily.    . isosorbide mononitrate (IMDUR) 60  MG 24 hr tablet TAKE ONE & ONE-HALF TABLETS BY MOUTH ONCE DAILY 135 tablet 2  . levothyroxine (SYNTHROID, LEVOTHROID) 25 MCG tablet Take 1 tablet (25 mcg total) by mouth daily. 30 tablet 1  . losartan (COZAAR) 100 MG tablet Take 1 tablet (100 mg total) by mouth daily. 90 tablet 1  . meloxicam (MOBIC) 15 MG tablet Take 1 tablet (15 mg total) by mouth daily. 30 tablet 1  . methenamine (MANDELAMINE) 1 g tablet Take 1,000 mg by mouth 3 (three) times daily.     . metoprolol tartrate (LOPRESSOR) 50 MG tablet Take 1 tablet (50 mg total) by mouth 2 (two) times daily. 120 tablet 3  . nitroGLYCERIN (NITROSTAT) 0.4 MG SL tablet Place 1 tablet (0.4 mg total) under the tongue every 5 (five) minutes as needed. For chest pain 25 tablet 3  . pravastatin (PRAVACHOL) 40 MG tablet  Take 1 tablet (40 mg total) by mouth daily. 90 tablet 2  . pregabalin (LYRICA) 100 MG capsule Take 100 mg by mouth 3 (three) times daily.    [name] rOPINIRole (REQUIP) 0.25 MG tablet Take 1 tablet 3 hours before bedtime 30 tablet 5  . venlafaxine XR (EFFEXOR-XR) 37.5 MG 24 hr capsule Take 1 capsule (37.5 mg total) by mouth daily with breakfast. 30 capsule 1   No current facility-administered medications for this visit.     Allergies:   Patient has no known allergies.   Social History:  The patient  reports that  has never smoked. he has never used smokeless tobacco. He reports that he does not drink alcohol or use drugs.   Family History:  The patient's family history includes Healthy in his brother and sister; Hyperlipidemia in his father and mother; Hypertension in his father and mother.   ROS:  Please see the history of present illness.     All other systems reviewed and negative.   PHYSICAL EXAM: VS:  BP 136/78   Pulse 62   Ht 5\' 10"  (1.778 m)   Wt 178 lb (80.7 kg)   BMI 25.54 kg/m   GENERAL:  Well appearing HEENT:  PERRL, EOMI, sclera are clear. Oropharynx is clear. NECK:  No jugular venous distention, carotid upstroke brisk and symmetric, no bruits, no thyromegaly or adenopathy LUNGS:  Clear to auscultation bilaterally CHEST:  Unremarkable HEART:  RRR,  PMI not displaced or sustained,S1 and S2 within normal limits, no S3, no S4: no clicks, no rubs, no murmurs ABD:  Soft, nontender. BS +, no masses or bruits. No hepatomegaly, no splenomegaly EXT:  2 + pulses throughout, no edema, no cyanosis no clubbing SKIN:  Warm and dry.  No rashes NEURO:  Alert and oriented x 3. Cranial nerves II through XII intact. PSYCH:  Cognitively intact    Laboratory data:  Lab Results  Component Value Date   WBC 5.4 06/01/2013   HGB 13.8 06/01/2013   HCT 40.1 06/01/2013   PLT 196.0 06/01/2013   GLUCOSE 132 (H) 03/01/2015   CHOL 234 (H) 03/01/2015   TRIG 1,121 (H) 03/01/2015   HDL 28 (L)  03/01/2015   LDLDIRECT 45 03/01/2015   LDLCALC NOT CALC 03/01/2015   ALT 26 03/01/2015   AST 22 03/01/2015   NA 139 03/01/2015   K 4.6 03/01/2015   CL 100 03/01/2015   CREATININE 1.38 (H) 03/01/2015   BUN 32 (H) 03/01/2015   CO2 26 03/01/2015   TSH 3.44 06/01/2013   INR 1.03 02/26/2011   HGBA1C 5.9 06/02/2013  Ecg today shows NSR rate 62. Normal Ecg. I have personally reviewed and interpreted this study.   ASSESSMENT AND PLAN:  1. Orthostatic intolerance: this has resolved with reduction in medication. 2. CAD (coronary artery disease): s/p remote CABG in 2008. Occluded SVG to OM1 and OM2 in 2013.  Stable angina pectoris class 1. Continue aspirin, Plavix, nitrates, beta blocker. Sl Ntg refilled.  3. HTN (hypertension):  Controlled.  4. Hyperlipidemia- combined with severe hypertriglyceridemia. :   On pravastatin, fenofibrate, fish oil. Will check fasting lab work today. 5. CKD (chronic kidney disease) stage 2, GFR 60-89 ml/min:  Repeat chemistries 6. Renal calculi  I will follow up in 6 months.

## 2017-01-28 DIAGNOSIS — N50819 Testicular pain, unspecified: Secondary | ICD-10-CM | POA: Diagnosis not present

## 2017-01-28 DIAGNOSIS — M898X1 Other specified disorders of bone, shoulder: Secondary | ICD-10-CM | POA: Diagnosis not present

## 2017-01-28 DIAGNOSIS — R109 Unspecified abdominal pain: Secondary | ICD-10-CM | POA: Diagnosis not present

## 2017-01-28 DIAGNOSIS — G8929 Other chronic pain: Secondary | ICD-10-CM | POA: Diagnosis not present

## 2017-01-28 DIAGNOSIS — G894 Chronic pain syndrome: Secondary | ICD-10-CM | POA: Diagnosis not present

## 2017-01-30 ENCOUNTER — Ambulatory Visit (INDEPENDENT_AMBULATORY_CARE_PROVIDER_SITE_OTHER): Payer: Medicare Other | Admitting: Cardiology

## 2017-01-30 ENCOUNTER — Encounter: Payer: Self-pay | Admitting: Cardiology

## 2017-01-30 VITALS — BP 136/78 | HR 62 | Ht 70.0 in | Wt 178.0 lb

## 2017-01-30 DIAGNOSIS — I10 Essential (primary) hypertension: Secondary | ICD-10-CM

## 2017-01-30 DIAGNOSIS — E782 Mixed hyperlipidemia: Secondary | ICD-10-CM

## 2017-01-30 DIAGNOSIS — I25119 Atherosclerotic heart disease of native coronary artery with unspecified angina pectoris: Secondary | ICD-10-CM

## 2017-01-30 DIAGNOSIS — I209 Angina pectoris, unspecified: Secondary | ICD-10-CM | POA: Diagnosis not present

## 2017-01-30 DIAGNOSIS — N182 Chronic kidney disease, stage 2 (mild): Secondary | ICD-10-CM | POA: Diagnosis not present

## 2017-01-30 MED ORDER — METOPROLOL TARTRATE 50 MG PO TABS: 50.0000 mg | ORAL_TABLET | Freq: Two times a day (BID) | ORAL | 3 refills | Status: DC

## 2017-01-30 NOTE — Patient Instructions (Signed)
We will check blood work today  Continue your current therapy  I will see you in 6 months 

## 2017-01-31 ENCOUNTER — Telehealth: Payer: Self-pay | Admitting: Family

## 2017-01-31 ENCOUNTER — Other Ambulatory Visit: Payer: Self-pay

## 2017-01-31 DIAGNOSIS — R7309 Other abnormal glucose: Secondary | ICD-10-CM

## 2017-01-31 DIAGNOSIS — E782 Mixed hyperlipidemia: Secondary | ICD-10-CM

## 2017-01-31 DIAGNOSIS — I25119 Atherosclerotic heart disease of native coronary artery with unspecified angina pectoris: Secondary | ICD-10-CM

## 2017-01-31 LAB — CBC WITH DIFFERENTIAL/PLATELET
BASOS: 1 %
Basophils Absolute: 0 10*3/uL (ref 0.0–0.2)
EOS (ABSOLUTE): 0.3 10*3/uL (ref 0.0–0.4)
EOS: 5 %
HEMATOCRIT: 42.2 % (ref 37.5–51.0)
HEMOGLOBIN: 14.1 g/dL (ref 13.0–17.7)
Immature Grans (Abs): 0 10*3/uL (ref 0.0–0.1)
Immature Granulocytes: 0 %
Lymphocytes Absolute: 1.6 10*3/uL (ref 0.7–3.1)
Lymphs: 27 %
MCH: 31.3 pg (ref 26.6–33.0)
MCHC: 33.4 g/dL (ref 31.5–35.7)
MCV: 94 fL (ref 79–97)
MONOS ABS: 0.5 10*3/uL (ref 0.1–0.9)
Monocytes: 8 %
NEUTROS ABS: 3.4 10*3/uL (ref 1.4–7.0)
Neutrophils: 59 %
PLATELETS: 217 10*3/uL (ref 150–379)
RBC: 4.51 x10E6/uL (ref 4.14–5.80)
RDW: 14 % (ref 12.3–15.4)
WBC: 5.8 10*3/uL (ref 3.4–10.8)

## 2017-01-31 LAB — BASIC METABOLIC PANEL
BUN/Creatinine Ratio: 22 — ABNORMAL HIGH (ref 9–20)
BUN: 29 mg/dL — ABNORMAL HIGH (ref 6–24)
CALCIUM: 9.5 mg/dL (ref 8.7–10.2)
CHLORIDE: 101 mmol/L (ref 96–106)
CO2: 24 mmol/L (ref 20–29)
Creatinine, Ser: 1.29 mg/dL — ABNORMAL HIGH (ref 0.76–1.27)
GFR, EST AFRICAN AMERICAN: 70 mL/min/{1.73_m2} (ref >59)
GFR, EST NON AFRICAN AMERICAN: 61 mL/min/{1.73_m2} (ref >59)
Glucose: 159 mg/dL — ABNORMAL HIGH (ref 65–99)
POTASSIUM: 4.6 mmol/L (ref 3.5–5.2)
SODIUM: 141 mmol/L (ref 134–144)

## 2017-01-31 LAB — LIPID PANEL W/O CHOL/HDL RATIO
Cholesterol, Total: 204 mg/dL — ABNORMAL HIGH (ref 100–199)
HDL: 36 mg/dL — AB (ref >39)
LDL Calculated: 116 mg/dL — ABNORMAL HIGH (ref 0–99)
Triglycerides: 262 mg/dL — ABNORMAL HIGH (ref 0–149)
VLDL Cholesterol Cal: 52 mg/dL — ABNORMAL HIGH (ref 5–40)

## 2017-01-31 LAB — HEPATIC FUNCTION PANEL
ALBUMIN: 4.6 g/dL (ref 3.5–5.5)
ALK PHOS: 67 IU/L (ref 39–117)
ALT: 18 IU/L (ref 0–44)
AST: 17 IU/L (ref 0–40)
Bilirubin Total: 0.3 mg/dL (ref 0.0–1.2)
Bilirubin, Direct: 0.1 mg/dL (ref 0.00–0.40)
TOTAL PROTEIN: 7.2 g/dL (ref 6.0–8.5)

## 2017-01-31 NOTE — Telephone Encounter (Signed)
Just reaching out to see if he has been contacted about Cologuard test? Please let our office know if needs something else.

## 2017-02-03 ENCOUNTER — Other Ambulatory Visit: Payer: Self-pay

## 2017-02-03 DIAGNOSIS — E782 Mixed hyperlipidemia: Secondary | ICD-10-CM

## 2017-02-03 DIAGNOSIS — I25119 Atherosclerotic heart disease of native coronary artery with unspecified angina pectoris: Secondary | ICD-10-CM

## 2017-02-03 DIAGNOSIS — R7309 Other abnormal glucose: Secondary | ICD-10-CM

## 2017-02-03 MED ORDER — ROSUVASTATIN CALCIUM 40 MG PO TABS: 40.0000 mg | ORAL_TABLET | Freq: Every day | ORAL | 6 refills | Status: DC

## 2017-02-06 DIAGNOSIS — N200 Calculus of kidney: Secondary | ICD-10-CM | POA: Diagnosis not present

## 2017-02-10 NOTE — Telephone Encounter (Signed)
Pt has received kit.

## 2017-04-01 DIAGNOSIS — M25519 Pain in unspecified shoulder: Secondary | ICD-10-CM | POA: Diagnosis not present

## 2017-04-01 DIAGNOSIS — M549 Dorsalgia, unspecified: Secondary | ICD-10-CM | POA: Diagnosis not present

## 2017-04-01 DIAGNOSIS — Z951 Presence of aortocoronary bypass graft: Secondary | ICD-10-CM | POA: Diagnosis not present

## 2017-04-01 DIAGNOSIS — M898X1 Other specified disorders of bone, shoulder: Secondary | ICD-10-CM | POA: Diagnosis not present

## 2017-04-01 DIAGNOSIS — R3982 Chronic bladder pain: Secondary | ICD-10-CM | POA: Diagnosis not present

## 2017-04-01 DIAGNOSIS — N189 Chronic kidney disease, unspecified: Secondary | ICD-10-CM | POA: Diagnosis not present

## 2017-04-01 DIAGNOSIS — Z7982 Long term (current) use of aspirin: Secondary | ICD-10-CM | POA: Diagnosis not present

## 2017-04-01 DIAGNOSIS — M79606 Pain in leg, unspecified: Secondary | ICD-10-CM | POA: Diagnosis not present

## 2017-04-01 DIAGNOSIS — I129 Hypertensive chronic kidney disease with stage 1 through stage 4 chronic kidney disease, or unspecified chronic kidney disease: Secondary | ICD-10-CM | POA: Diagnosis not present

## 2017-04-01 DIAGNOSIS — R109 Unspecified abdominal pain: Secondary | ICD-10-CM | POA: Diagnosis not present

## 2017-04-01 DIAGNOSIS — G8929 Other chronic pain: Secondary | ICD-10-CM | POA: Diagnosis not present

## 2017-04-01 DIAGNOSIS — Z79899 Other long term (current) drug therapy: Secondary | ICD-10-CM | POA: Diagnosis not present

## 2017-04-01 DIAGNOSIS — E785 Hyperlipidemia, unspecified: Secondary | ICD-10-CM | POA: Diagnosis not present

## 2017-04-01 DIAGNOSIS — I2510 Atherosclerotic heart disease of native coronary artery without angina pectoris: Secondary | ICD-10-CM | POA: Diagnosis not present

## 2017-04-01 DIAGNOSIS — G894 Chronic pain syndrome: Secondary | ICD-10-CM | POA: Diagnosis not present

## 2017-04-01 DIAGNOSIS — N50819 Testicular pain, unspecified: Secondary | ICD-10-CM | POA: Diagnosis not present

## 2017-04-08 DIAGNOSIS — N3000 Acute cystitis without hematuria: Secondary | ICD-10-CM | POA: Diagnosis not present

## 2017-04-15 ENCOUNTER — Other Ambulatory Visit: Payer: Self-pay | Admitting: Cardiology

## 2017-04-15 NOTE — Telephone Encounter (Signed)
REFILL 

## 2017-04-24 DIAGNOSIS — N3000 Acute cystitis without hematuria: Secondary | ICD-10-CM | POA: Diagnosis not present

## 2017-05-01 ENCOUNTER — Other Ambulatory Visit: Payer: Self-pay | Admitting: Neurology

## 2017-05-05 ENCOUNTER — Encounter: Payer: Self-pay | Admitting: Neurology

## 2017-05-05 ENCOUNTER — Ambulatory Visit (INDEPENDENT_AMBULATORY_CARE_PROVIDER_SITE_OTHER): Payer: Medicare Other | Admitting: Neurology

## 2017-05-05 ENCOUNTER — Other Ambulatory Visit (INDEPENDENT_AMBULATORY_CARE_PROVIDER_SITE_OTHER): Payer: Medicare Other

## 2017-05-05 VITALS — BP 114/70 | HR 59 | Ht 70.0 in | Wt 175.0 lb

## 2017-05-05 DIAGNOSIS — I25119 Atherosclerotic heart disease of native coronary artery with unspecified angina pectoris: Secondary | ICD-10-CM | POA: Diagnosis not present

## 2017-05-05 DIAGNOSIS — G2581 Restless legs syndrome: Secondary | ICD-10-CM

## 2017-05-05 DIAGNOSIS — R253 Fasciculation: Secondary | ICD-10-CM | POA: Diagnosis not present

## 2017-05-05 LAB — TSH: TSH: 2.35 u[IU]/mL (ref 0.35–4.50)

## 2017-05-05 LAB — TIQ-NTM

## 2017-05-05 LAB — VITAMIN B12: VITAMIN B 12: 750 pg/mL (ref 211–911)

## 2017-05-05 NOTE — Progress Notes (Signed)
Follow-up Visit   Date: 05/05/17    Andrew Joyce MRN: [id number] DOB: 23-Nov-1960   Interim History: Andrew Joyce is a 57 y.o. left-handed Caucasian male with congenital exstrophy status post multiple surgeries complicated by chronic UTI, CAD s/p CABG (2008), hyperlipidemia, hypertension, and stage II CKD returning to the clinic for follow-up of cramp-fasciculation syndrome and RLS.  History of present illness: Starting in October 2015, he developed "firing nerves" and cramping of the left legs. Symptoms are worse at night and keep him from sleeping because of painful cramps. They usually last several minutes.  He went to see his PCP who recommended melatonin, muscle relaxants (robaxin), ambien, and heat packs. He also tried taking valium at night which did not help much. Since starting baclofen the agitation of the leg and cramps improved.  In 2016, he noticed new twitches involving his right lower leg, but no associated weakness.  He also complains of severe right mid-back pain, worse with any type of activity. MRI thoracic spine did not show any nerve impingement.   UPDATE 12/29/2015:  Starting around the fall of 2017, he began having spells of restlessness of the of the left leg exclusively when he is laying in bed.  He often has to get up and walk to relieve the discomfort.  Symptoms occur about 3-4 times per month and last a few days.  His cramps are better controlled on a combination of baclofen 78m TID, carbamazepine 2047mBID, diazepam 2.28m23mhs, and tramadol 12m36ms.  For RLS, he was given sinemet but developed nausea and did not appreciate marked benefit.  UPDATE 11/01/2016:  He reports having worsening restless leg and inability to sleep after being taken off tramadol by his providers.  His pharmacy notified me that he was receiving tramadol from several providers, including myself.  I discontinued in the future prescriptions for tramadol and diazepam, and he  was encouraged to establish care with pain management which he has done.  He is being treated with Lyrica for his pain, but he reports he does not get adequate relief and is frustrated.  UPDATE 05/05/2017:  He is here for 6 month follow-up visit.  He is doing well on ropinirole 0.228mg12mbedtime for RLS.  For his cramps, he still takes baclofen 20mg 82mand carbamazepine 200mg t22m daily.  Today, he complains of intermittent muscle jerks and twitches.  He can be hold objects and they will fly out of his hand without his control.  There is no weakness or pain.  He does not have this when drowsy or falling asleep.  No new medications.  Medications:  Current Outpatient Medications on File Prior to Visit  Medication Sig Dispense Refill  . allopurinol (ZYLOPRIM) 100 MG tablet Take 1 tablet (100 mg total) by mouth daily. 30 tablet 6  . amLODipine (NORVASC) 5 MG tablet Take 1 tablet (5 mg total) by mouth daily. 30 tablet 6  . aspirin 81 MG tablet Take 1 tablet (81 mg total) by mouth daily.    . baclofen (LIORESAL) 10 MG tablet Take 2 tablets (20 mg total) by mouth 3 (three) times daily. 180 tablet 11  . carbamazepine (TEGRETOL) 200 MG tablet Take 200 mg by mouth 3 (three) times daily.    . EPITOL 200 MG tablet TAKE 1 TABLET BY MOUTH TWICE DAILY 60 tablet 3  . fenofibrate 160 MG tablet TAKE 1 TABLET BY MOUTH ONCE DAILY 90 tablet 1  . fish oil-omega-3 fatty acids 1000  MG capsule Take 2 capsules (2 g total) by mouth 2 (two) times daily.    . isosorbide mononitrate (IMDUR) 60 MG 24 hr tablet TAKE ONE & ONE-HALF TABLETS BY MOUTH ONCE DAILY 135 tablet 2  . levothyroxine (SYNTHROID, LEVOTHROID) 25 MCG tablet Take 1 tablet (25 mcg total) by mouth daily. 30 tablet 1  . losartan (COZAAR) 100 MG tablet Take 1 tablet (100 mg total) by mouth daily. 90 tablet 1  . meloxicam (MOBIC) 15 MG tablet Take 1 tablet (15 mg total) by mouth daily. 30 tablet 1  . methenamine (MANDELAMINE) 1 g tablet Take 1,000 mg by mouth 3  (three) times daily.     . metoprolol tartrate (LOPRESSOR) 50 MG tablet Take 1 tablet (50 mg total) by mouth 2 (two) times daily. 120 tablet 3  . nitroGLYCERIN (NITROSTAT) 0.4 MG SL tablet Place 1 tablet (0.4 mg total) under the tongue every 5 (five) minutes as needed. For chest pain 25 tablet 3  . pregabalin (LYRICA) 100 MG capsule Take 100 mg by mouth 3 (three) times daily.    [name] rOPINIRole (REQUIP) 0.25 MG tablet TAKE 1 TABLET BY MOUTH 3 HOURS BEFORE BEDTIME 30 tablet 5  . venlafaxine XR (EFFEXOR-XR) 37.5 MG 24 hr capsule Take 1 capsule (37.5 mg total) by mouth daily with breakfast. 30 capsule 1  . rosuvastatin (CRESTOR) 40 MG tablet Take 1 tablet (40 mg total) by mouth daily. 30 tablet 6   No current facility-administered medications on file prior to visit.     Allergies: No Known Allergies  Review of Systems:  CONSTITUTIONAL: No fevers, chills, night sweats, or weight loss.  EYES: No visual changes or eye pain ENT: No hearing changes.  No history of nose bleeds.   RESPIRATORY: No cough, wheezing and shortness of breath.   CARDIOVASCULAR: Negative for chest pain, and palpitations.   GI: Negative for abdominal discomfort, blood in stools or black stools.  No recent change in bowel habits.   GU:  No history of incontinence.   MUSCLOSKELETAL: No history of joint pain or swelling.  +cramps.   SKIN: Negative for lesions, rash, and itching.   ENDOCRINE: Negative for cold or heat intolerance, polydipsia or goiter.   PSYCH:  No depression or anxiety symptoms.   NEURO: As Above.   Vital Signs:  BP 114/70   Pulse (!) 59   Ht 5' 10"  (1.778 m)   Wt 175 lb (79.4 kg)   SpO2 98%   BMI 25.11 kg/m   Neurological Exam: MENTAL STATUS including orientation to time, place, person, recent and remote memory, attention span and concentration, language, and fund of knowledge is normal.  Speech is not dysarthric.  CRANIAL NERVES:  Face is symmetric.   MOTOR:  Mild atrophy of the left lower leg  (quads, medial gastroc). Active fasciculations of the left > right calf and left thigh. Normal tone.  Motor strength is 5/5 throughout.  MSRs: Reflexes are 1+ throughout, except 2+ patellar jerks bilaterally.  COORDINATION/GAIT:   Gait narrow based and stable.   Data: EMG 12/15/2013: 1. Chronic C6 radiculopathy affecting the left upper extremity. 2. Left ulnar neuropathy with slowing across the elbow, purely demyelinating in type. 3. Multilevel intraspinal canal lesion affecting L2-S1 myotomes bilaterally. These findings are moderate in degree electrically and worse on the left side involving the L2-L4 myotome. Active changes are also seen affecting the medial gastrocnemius muscle. These findings are insufficient for the diagnosis of motor neuron disease, recommend repeat electrodiagnostic testing in  6 months if clinically indicated.  Labs 07/14/2014:  CK 252*, vitamin D 22* Labs 11/25/2013:  CK 215, aldolase 9.2*, PTH 34, SPEP/UPEP with IFE no Mo protein, copper 116, zinc 67, CRP 0.6*, ESR 47  CT lumbar spine 12/17/2013: 1. Severe degenerative disc disease with disc height loss at L5-S1 with a mild broad-based disc osteophyte complex and right foraminal narrowing. 2. Mild broad-based disc bulge at L4-5 without foraminal or central canal stenosis.  MRI thoracic spine wo contrast 05/29/2014: Small central disc protrusions T3-4 and T7-8 without neural impingement or significant spinal stenosis. No acute abnormality.  MRI lumbar spine wo contrast 05/29/2014:  Mild to moderate spinal stenosis at L4-5 with small central disc protrusion and diffuse disc bulging and spondylosis.  Postop laminectomy left L5-S1 without recurrent disc protrusion. There is spondylosis causing subarticular and foraminal stenosis bilaterally at L5-S1.  Lab Results  Component Value Date   FERRITIN 180.0 12/29/2015     IMPRESSION/PLAN: 1.  Cramp fasciculation syndrome, stable. His exam continues to show active left >  right leg fasciculations, without evidence of weakness.   - Continue carbamazepine 200 mg twice daily baclofen 200 mg 3 times daily for cramps.   - Patient is aware that I will not prescribe controlled substances, therefore diazepam was discontinued.  He is established with pain management  2.  Restless leg syndrome, improved on ropinirole 0.27m at bedtime  3.  Myoclonic jerks, normal exam.    - Check TSH, copper, vitamin B12  - If this persists, next step is EEG  Return to clinic in 6 months, or sooner as needed  Greater than 50% of this 25 minute visit was spent in counseling, explanation of diagnosis, planning of further management, and coordination of care.   Thank you for allowing me to participate in patient's care.  If I can answer any additional questions, I would be pleased to do so.    Sincerely,    Donika K. PPosey Pronto DO

## 2017-05-05 NOTE — Patient Instructions (Signed)
Check labs  Continue your medications as you are taking  Return to clinic in 6 months

## 2017-05-08 DIAGNOSIS — R8279 Other abnormal findings on microbiological examination of urine: Secondary | ICD-10-CM | POA: Diagnosis not present

## 2017-05-08 DIAGNOSIS — N3020 Other chronic cystitis without hematuria: Secondary | ICD-10-CM | POA: Diagnosis not present

## 2017-05-08 LAB — COPPER, SERUM: Copper: 134 ug/dL (ref 70–175)

## 2017-05-14 DIAGNOSIS — N200 Calculus of kidney: Secondary | ICD-10-CM | POA: Diagnosis not present

## 2017-05-27 DIAGNOSIS — R8279 Other abnormal findings on microbiological examination of urine: Secondary | ICD-10-CM | POA: Diagnosis not present

## 2017-05-27 DIAGNOSIS — N3021 Other chronic cystitis with hematuria: Secondary | ICD-10-CM | POA: Diagnosis not present

## 2017-06-04 ENCOUNTER — Other Ambulatory Visit: Payer: Self-pay | Admitting: Cardiology

## 2017-06-04 NOTE — Telephone Encounter (Signed)
Rx sent to pharmacy   

## 2017-06-11 ENCOUNTER — Other Ambulatory Visit: Payer: Self-pay | Admitting: Cardiology

## 2017-06-11 NOTE — Telephone Encounter (Signed)
Rx sent to pharmacy   

## 2017-06-24 DIAGNOSIS — N50819 Testicular pain, unspecified: Secondary | ICD-10-CM | POA: Diagnosis not present

## 2017-06-24 DIAGNOSIS — G894 Chronic pain syndrome: Secondary | ICD-10-CM | POA: Diagnosis not present

## 2017-06-24 DIAGNOSIS — R109 Unspecified abdominal pain: Secondary | ICD-10-CM | POA: Diagnosis not present

## 2017-06-24 DIAGNOSIS — M898X1 Other specified disorders of bone, shoulder: Secondary | ICD-10-CM | POA: Diagnosis not present

## 2017-06-24 DIAGNOSIS — G8929 Other chronic pain: Secondary | ICD-10-CM | POA: Diagnosis not present

## 2017-07-01 DIAGNOSIS — K760 Fatty (change of) liver, not elsewhere classified: Secondary | ICD-10-CM | POA: Diagnosis not present

## 2017-07-01 DIAGNOSIS — N329 Bladder disorder, unspecified: Secondary | ICD-10-CM | POA: Diagnosis not present

## 2017-07-01 DIAGNOSIS — N1330 Unspecified hydronephrosis: Secondary | ICD-10-CM | POA: Diagnosis not present

## 2017-07-01 DIAGNOSIS — M549 Dorsalgia, unspecified: Secondary | ICD-10-CM | POA: Diagnosis not present

## 2017-07-01 DIAGNOSIS — Z79899 Other long term (current) drug therapy: Secondary | ICD-10-CM | POA: Diagnosis not present

## 2017-07-01 DIAGNOSIS — N132 Hydronephrosis with renal and ureteral calculous obstruction: Secondary | ICD-10-CM | POA: Diagnosis not present

## 2017-07-01 DIAGNOSIS — N182 Chronic kidney disease, stage 2 (mild): Secondary | ICD-10-CM | POA: Diagnosis not present

## 2017-07-01 DIAGNOSIS — G8929 Other chronic pain: Secondary | ICD-10-CM | POA: Diagnosis not present

## 2017-07-01 DIAGNOSIS — R9341 Abnormal radiologic findings on diagnostic imaging of renal pelvis, ureter, or bladder: Secondary | ICD-10-CM | POA: Diagnosis not present

## 2017-07-01 DIAGNOSIS — I129 Hypertensive chronic kidney disease with stage 1 through stage 4 chronic kidney disease, or unspecified chronic kidney disease: Secondary | ICD-10-CM | POA: Diagnosis not present

## 2017-07-01 DIAGNOSIS — I252 Old myocardial infarction: Secondary | ICD-10-CM | POA: Diagnosis not present

## 2017-07-01 DIAGNOSIS — N210 Calculus in bladder: Secondary | ICD-10-CM | POA: Diagnosis not present

## 2017-07-01 DIAGNOSIS — N3289 Other specified disorders of bladder: Secondary | ICD-10-CM | POA: Diagnosis not present

## 2017-07-01 DIAGNOSIS — Z951 Presence of aortocoronary bypass graft: Secondary | ICD-10-CM | POA: Diagnosis not present

## 2017-07-01 DIAGNOSIS — R319 Hematuria, unspecified: Secondary | ICD-10-CM | POA: Diagnosis not present

## 2017-07-01 DIAGNOSIS — I2510 Atherosclerotic heart disease of native coronary artery without angina pectoris: Secondary | ICD-10-CM | POA: Diagnosis not present

## 2017-07-01 DIAGNOSIS — Q6410 Exstrophy of urinary bladder, unspecified: Secondary | ICD-10-CM | POA: Diagnosis not present

## 2017-07-01 DIAGNOSIS — N200 Calculus of kidney: Secondary | ICD-10-CM | POA: Diagnosis not present

## 2017-07-01 DIAGNOSIS — Z792 Long term (current) use of antibiotics: Secondary | ICD-10-CM | POA: Diagnosis not present

## 2017-07-01 DIAGNOSIS — Z7982 Long term (current) use of aspirin: Secondary | ICD-10-CM | POA: Diagnosis not present

## 2017-07-01 DIAGNOSIS — Z79891 Long term (current) use of opiate analgesic: Secondary | ICD-10-CM | POA: Diagnosis not present

## 2017-07-01 DIAGNOSIS — N179 Acute kidney failure, unspecified: Secondary | ICD-10-CM | POA: Diagnosis not present

## 2017-07-01 DIAGNOSIS — N1339 Other hydronephrosis: Secondary | ICD-10-CM | POA: Diagnosis not present

## 2017-07-01 DIAGNOSIS — Z87440 Personal history of urinary (tract) infections: Secondary | ICD-10-CM | POA: Diagnosis not present

## 2017-07-01 DIAGNOSIS — Z8249 Family history of ischemic heart disease and other diseases of the circulatory system: Secondary | ICD-10-CM | POA: Diagnosis not present

## 2017-07-07 DIAGNOSIS — Q742 Other congenital malformations of lower limb(s), including pelvic girdle: Secondary | ICD-10-CM | POA: Diagnosis not present

## 2017-07-07 DIAGNOSIS — R319 Hematuria, unspecified: Secondary | ICD-10-CM | POA: Diagnosis not present

## 2017-07-07 DIAGNOSIS — Q6410 Exstrophy of urinary bladder, unspecified: Secondary | ICD-10-CM | POA: Diagnosis not present

## 2017-07-07 DIAGNOSIS — N132 Hydronephrosis with renal and ureteral calculous obstruction: Secondary | ICD-10-CM | POA: Diagnosis not present

## 2017-07-07 DIAGNOSIS — N329 Bladder disorder, unspecified: Secondary | ICD-10-CM | POA: Diagnosis not present

## 2017-07-16 ENCOUNTER — Telehealth: Payer: Self-pay

## 2017-07-16 DIAGNOSIS — G8929 Other chronic pain: Secondary | ICD-10-CM | POA: Diagnosis not present

## 2017-07-16 DIAGNOSIS — E785 Hyperlipidemia, unspecified: Secondary | ICD-10-CM | POA: Diagnosis not present

## 2017-07-16 DIAGNOSIS — Z951 Presence of aortocoronary bypass graft: Secondary | ICD-10-CM | POA: Diagnosis not present

## 2017-07-16 DIAGNOSIS — N182 Chronic kidney disease, stage 2 (mild): Secondary | ICD-10-CM | POA: Diagnosis not present

## 2017-07-16 DIAGNOSIS — I129 Hypertensive chronic kidney disease with stage 1 through stage 4 chronic kidney disease, or unspecified chronic kidney disease: Secondary | ICD-10-CM | POA: Diagnosis not present

## 2017-07-16 DIAGNOSIS — I2510 Atherosclerotic heart disease of native coronary artery without angina pectoris: Secondary | ICD-10-CM | POA: Diagnosis not present

## 2017-07-16 DIAGNOSIS — N1330 Unspecified hydronephrosis: Secondary | ICD-10-CM | POA: Diagnosis not present

## 2017-07-16 DIAGNOSIS — M549 Dorsalgia, unspecified: Secondary | ICD-10-CM | POA: Diagnosis not present

## 2017-07-16 DIAGNOSIS — N132 Hydronephrosis with renal and ureteral calculous obstruction: Secondary | ICD-10-CM | POA: Diagnosis not present

## 2017-07-16 DIAGNOSIS — N329 Bladder disorder, unspecified: Secondary | ICD-10-CM | POA: Diagnosis not present

## 2017-07-16 DIAGNOSIS — I252 Old myocardial infarction: Secondary | ICD-10-CM | POA: Diagnosis not present

## 2017-07-16 DIAGNOSIS — N179 Acute kidney failure, unspecified: Secondary | ICD-10-CM | POA: Diagnosis not present

## 2017-07-16 NOTE — Telephone Encounter (Signed)
   [facility] Pre-operative Risk Assessment    Request for surgical clearance:  1. What type of surgery is being performed? Cystourethroscopy with transurethral resection of bladder tumor  2. Andrew Joyce is this surgery scheduled? 07/23/17    3. What type of clearance is required (medical clearance vs. Pharmacy clearance to hold med vs. Both)? Both  4. Are there any medications that need to be held prior to surgery and how long? Aspirin  5. Practice name and name of physician performing surgery? [facility] Department of Urology - Andrew Joyce  6. What is your office phone number [phone]   7.   What is your office fax number [phone]  8.   Anesthesia type (None, local, MAC, general) ? Not Specified    Andrew Joyce 07/16/2017, 3:33 PM  _________________________________________________________________   (provider comments below)

## 2017-07-17 NOTE — Telephone Encounter (Signed)
Dr. Martinique, this pt has s/p remote CABG in 2008. Occluded SVG to OM1 and OM2 in 2013.  Stable angina pectoris class 1.  Can aspirin be held for Cystourethroscopy with transurethral resection of bladder tumor?  Please route response back to CV DIV PREOP  Thank you

## 2017-07-18 NOTE — Telephone Encounter (Signed)
Left VM for pt to call back to discuss his current status.

## 2017-07-18 NOTE — Telephone Encounter (Signed)
ASA may be held for 5 days for urologic procedures  Peter Martinique MD, [facility]

## 2017-07-21 DIAGNOSIS — N329 Bladder disorder, unspecified: Secondary | ICD-10-CM | POA: Diagnosis not present

## 2017-07-21 DIAGNOSIS — G894 Chronic pain syndrome: Secondary | ICD-10-CM | POA: Diagnosis not present

## 2017-07-21 DIAGNOSIS — R001 Bradycardia, unspecified: Secondary | ICD-10-CM | POA: Diagnosis not present

## 2017-07-21 DIAGNOSIS — N3289 Other specified disorders of bladder: Secondary | ICD-10-CM | POA: Diagnosis not present

## 2017-07-21 DIAGNOSIS — I10 Essential (primary) hypertension: Secondary | ICD-10-CM | POA: Diagnosis not present

## 2017-07-21 DIAGNOSIS — Z01818 Encounter for other preprocedural examination: Secondary | ICD-10-CM | POA: Diagnosis not present

## 2017-07-21 NOTE — Telephone Encounter (Signed)
Pt calling and returning call to pre-op team

## 2017-07-21 NOTE — Telephone Encounter (Signed)
   Primary Cardiologist: Dr. Peter Martinique  Chart reviewed as part of pre-operative protocol coverage. Patient was contacted 07/21/2017 in reference to pre-operative risk assessment for pending surgery as outlined below.  Andrew Joyce was last seen on January, 24, 2019 by Martinique.  Since that day, Andrew Joyce has done well with NO cardiac complaints.  Therefore, based on ACC/AHA guidelines, the patient would be at acceptable risk for the planned procedure without further cardiovascular testing.   It was recommended to hold aspirin 5 days prior to the procedure - I have spoken to the patient he is currently not taking due to his GU issues. His surgery is for later this week.   I will route this recommendation to the requesting party via Epic fax function and remove from pre-op pool.  Please call with questions.  Truitt Merle, NP 07/21/2017, 3:18 PM

## 2017-07-21 NOTE — Progress Notes (Signed)
Cardiology Office Note   Date:  07/24/2017   ID:  Andrew Joyce, DOB [birth year], MRN [id number]  PCP:  [name], NP  Cardiologist:  Dr. Pablo Mathurin Martinique      History of Present Illness: Andrew Joyce is a 57 y.o. male seen for follow up CAD. He has a hx of CAD s/p CABG in 2008, HTN, HL, CKD.  He suffered a non-STEMI in February 2013. Culprit was felt to be an occluded SVG-OM1/OM2. Native circumflex had 40-50% distal stenosis. There were no targets for revascularization and he was treated medically.   He has a history orthostatic intolerance. Imdur dose was reduced.  Echo May 2015 demonstrated EF 55-60%, mild inferior hypokinesis, top normal LA/RA size.Poorly visualized aortic valve, however, there is mild central AI - no stenosis.    On follow up today he is doing well from a cardiac standpoint. He denies any chest pain or need for Ntg.  He stays active- mainly with weight lifting. Works out every other day.  He does have recurrent UTIs and renal stones followed by urology. He has frequent calculi passing multiple stones a month. This has improved with something to increase his citrate level.  He underwent cystourethroscopy with ureteral stenting and resection of a bladder tumor yesterday.  He has a history of  severe hypertriglyceridemia and last labs in January showed elevated sugar.     Recent Labs: 01/30/2017: ALT 18; Creatinine, Ser 1.29; HDL 36; Hemoglobin 14.1; LDL Calculated 116; Potassium 4.6 05/05/2017: TSH 2.35  Wt Readings from Last 3 Encounters:  07/24/17 173 lb (78.5 kg)  05/05/17 175 lb (79.4 kg)  01/30/17 178 lb (80.7 kg)     Past Medical History:  Diagnosis Date  . 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 Chronic kidney disease (CKD), stage II (mild)   . Congenital birth defect   . Coronary artery disease    a. s/p CABG in 2008; b.  s/p cath in 2011 for med rx.;  c.  NSTEMI (02/2011):  LHC (02/2011):  LAD  occluded, distal LAD filled via left to left collaterals, distal CFX 40-50%, proximal RCA occluded (right to right collaterals), distal RCA occluded, S-RCA occluded proximally, S-OM1/OM2 occluded (new from 2011 - culprit), L-LAD ok, ant apical HK, EF 45-50% - med Rx.  [name] Dyslipidemia   . History of renal stone   . Hx of cardiovascular stress test    Nuclear (06/2009):  EF 71%, small area of infarct/ischemia in inferior wall-unchanged from prior  . Hx of echocardiogram    Echo (05/2013):  EF 55-60%, inf HK, mild AI, normal RVSF, RVSP 28 mmHg  . Hypertension   . Pancreatitis   . Recurrent UTI     Current Outpatient Medications  Medication Sig Dispense Refill  . allopurinol (ZYLOPRIM) 100 MG tablet Take 1 tablet (100 mg total) by mouth daily. 30 tablet 6  . amLODipine (NORVASC) 5 MG tablet Take 1 tablet (5 mg total) by mouth daily. 30 tablet 6  . amoxicillin (AMOXIL) 500 MG capsule TAKE 1 CAPSULE BY MOUTH THREE TIMES DAILY FOR 10 DAYS  0  . aspirin 81 MG tablet Take 1 tablet (81 mg total) by mouth daily.    . baclofen (LIORESAL) 10 MG tablet Take 2 tablets (20 mg total) by mouth 3 (three) times daily. 180 tablet 11  . carbamazepine (TEGRETOL) 200 MG tablet Take 200 mg by mouth 3 (three) times daily.    [name]  EPITOL 200 MG tablet TAKE 1 TABLET BY MOUTH TWICE DAILY 60 tablet 3  . fenofibrate 160 MG tablet TAKE 1 TABLET BY MOUTH ONCE DAILY 90 tablet 1  . fish oil-omega-3 fatty acids 1000 MG capsule Take 2 capsules (2 g total) by mouth 2 (two) times daily.    . isosorbide mononitrate (IMDUR) 60 MG 24 hr tablet TAKE 1 & 1/2 (ONE & ONE-HALF) TABLETS BY MOUTH ONCE DAILY 135 tablet 2  . levothyroxine (SYNTHROID, LEVOTHROID) 25 MCG tablet Take 1 tablet (25 mcg total) by mouth daily. 30 tablet 1  . losartan (COZAAR) 100 MG tablet Take 1 tablet (100 mg total) by mouth daily. 90 tablet 1  . losartan (COZAAR) 100 MG tablet TAKE 1 TABLET BY MOUTH ONCE DAILY 90 tablet 0  . meloxicam (MOBIC) 15 MG tablet Take 1  tablet (15 mg total) by mouth daily. 30 tablet 1  . methenamine (MANDELAMINE) 1 g tablet Take 1,000 mg by mouth 3 (three) times daily.     . metoprolol tartrate (LOPRESSOR) 50 MG tablet Take 1 tablet (50 mg total) by mouth 2 (two) times daily. 120 tablet 3  . nitroGLYCERIN (NITROSTAT) 0.4 MG SL tablet Place 1 tablet (0.4 mg total) under the tongue every 5 (five) minutes as needed. For chest pain 25 tablet 3  . pregabalin (LYRICA) 100 MG capsule Take 100 mg by mouth 3 (three) times daily.    [name] rOPINIRole (REQUIP) 0.25 MG tablet TAKE 1 TABLET BY MOUTH 3 HOURS BEFORE BEDTIME 30 tablet 5  . rosuvastatin (CRESTOR) 40 MG tablet Take 1 tablet (40 mg total) by mouth daily. 30 tablet 6  . venlafaxine XR (EFFEXOR-XR) 37.5 MG 24 hr capsule Take 1 capsule (37.5 mg total) by mouth daily with breakfast. 30 capsule 1   No current facility-administered medications for this visit.     Allergies:   Patient has no known allergies.   Social History:  The patient  reports that he has never smoked. He has never used smokeless tobacco. He reports that he does not drink alcohol or use drugs.   Family History:  The patient's family history includes Healthy in his brother and sister; Hyperlipidemia in his father and mother; Hypertension in his father and mother.   ROS:  Please see the history of present illness.     All other systems reviewed and negative.   PHYSICAL EXAM: VS:  BP 122/78   Pulse (!) 59   Ht 5\' 10"  (1.778 m)   Wt 173 lb (78.5 kg)   SpO2 99%   BMI 24.82 kg/m   GENERAL:  Well appearing WM in NAD HEENT:  PERRL, EOMI, sclera are clear. Oropharynx is clear. NECK:  No jugular venous distention, carotid upstroke brisk and symmetric, no bruits, no thyromegaly or adenopathy LUNGS:  Clear to auscultation bilaterally CHEST:  Unremarkable HEART:  RRR,  PMI not displaced or sustained,S1 and S2 within normal limits, no S3, no S4: no clicks, no rubs, no murmurs ABD:  Soft, nontender. BS +, no masses or  bruits. No hepatomegaly, no splenomegaly EXT:  2 + pulses throughout, no edema, no cyanosis no clubbing SKIN:  Warm and dry.  No rashes NEURO:  Alert and oriented x 3. Cranial nerves II through XII intact. PSYCH:  Cognitively intact      Laboratory data:  Lab Results  Component Value Date   WBC 5.8 01/30/2017   HGB 14.1 01/30/2017   HCT 42.2 01/30/2017   PLT 217 01/30/2017   GLUCOSE  159 (H) 01/30/2017   CHOL 204 (H) 01/30/2017   TRIG 262 (H) 01/30/2017   HDL 36 (L) 01/30/2017   LDLDIRECT 45 03/01/2015   LDLCALC 116 (H) 01/30/2017   ALT 18 01/30/2017   AST 17 01/30/2017   NA 141 01/30/2017   K 4.6 01/30/2017   CL 101 01/30/2017   CREATININE 1.29 (H) 01/30/2017   BUN 29 (H) 01/30/2017   CO2 24 01/30/2017   TSH 2.35 05/05/2017   INR 1.03 02/26/2011   HGBA1C 5.9 06/02/2013     ASSESSMENT AND PLAN:  1. Orthostatic intolerance: this has resolved with reduction in medication. 2. CAD (coronary artery disease): s/p remote CABG in 2008. Occluded SVG to OM1 and OM2 in 2013.  Stable angina pectoris class 1. Continue aspirin, Plavix, nitrates, beta blocker.  3. HTN (hypertension):  This is well controlled.  4. Hyperlipidemia- combined with severe hypertriglyceridemia. :   On pravastatin, fenofibrate, fish oil. Last lipid panel showed significant improvement in triglycerides to 262. Will update today 5. CKD (chronic kidney disease) stage 2, GFR 60-89 ml/min:   6. Renal calculi- recurrent 7. Elevated blood glucose. Will repeat today and check an A1c.  I will follow up in 6 months.

## 2017-07-23 DIAGNOSIS — N135 Crossing vessel and stricture of ureter without hydronephrosis: Secondary | ICD-10-CM | POA: Diagnosis not present

## 2017-07-23 DIAGNOSIS — I2510 Atherosclerotic heart disease of native coronary artery without angina pectoris: Secondary | ICD-10-CM | POA: Diagnosis not present

## 2017-07-23 DIAGNOSIS — N210 Calculus in bladder: Secondary | ICD-10-CM | POA: Diagnosis not present

## 2017-07-23 DIAGNOSIS — E785 Hyperlipidemia, unspecified: Secondary | ICD-10-CM | POA: Diagnosis not present

## 2017-07-23 DIAGNOSIS — C679 Malignant neoplasm of bladder, unspecified: Secondary | ICD-10-CM | POA: Diagnosis not present

## 2017-07-23 DIAGNOSIS — N3289 Other specified disorders of bladder: Secondary | ICD-10-CM | POA: Diagnosis not present

## 2017-07-23 DIAGNOSIS — D494 Neoplasm of unspecified behavior of bladder: Secondary | ICD-10-CM | POA: Diagnosis not present

## 2017-07-23 DIAGNOSIS — I252 Old myocardial infarction: Secondary | ICD-10-CM | POA: Diagnosis not present

## 2017-07-23 DIAGNOSIS — N132 Hydronephrosis with renal and ureteral calculous obstruction: Secondary | ICD-10-CM | POA: Diagnosis not present

## 2017-07-23 DIAGNOSIS — N1339 Other hydronephrosis: Secondary | ICD-10-CM | POA: Diagnosis not present

## 2017-07-23 DIAGNOSIS — N1330 Unspecified hydronephrosis: Secondary | ICD-10-CM | POA: Diagnosis not present

## 2017-07-23 DIAGNOSIS — Z951 Presence of aortocoronary bypass graft: Secondary | ICD-10-CM | POA: Diagnosis not present

## 2017-07-23 DIAGNOSIS — N182 Chronic kidney disease, stage 2 (mild): Secondary | ICD-10-CM | POA: Diagnosis not present

## 2017-07-23 DIAGNOSIS — C689 Malignant neoplasm of urinary organ, unspecified: Secondary | ICD-10-CM | POA: Diagnosis not present

## 2017-07-23 DIAGNOSIS — I129 Hypertensive chronic kidney disease with stage 1 through stage 4 chronic kidney disease, or unspecified chronic kidney disease: Secondary | ICD-10-CM | POA: Diagnosis not present

## 2017-07-24 ENCOUNTER — Ambulatory Visit (INDEPENDENT_AMBULATORY_CARE_PROVIDER_SITE_OTHER): Payer: Medicare Other | Admitting: Cardiology

## 2017-07-24 ENCOUNTER — Encounter: Payer: Self-pay | Admitting: Cardiology

## 2017-07-24 VITALS — BP 122/78 | HR 59 | Ht 70.0 in | Wt 173.0 lb

## 2017-07-24 DIAGNOSIS — R7309 Other abnormal glucose: Secondary | ICD-10-CM

## 2017-07-24 DIAGNOSIS — I25119 Atherosclerotic heart disease of native coronary artery with unspecified angina pectoris: Secondary | ICD-10-CM | POA: Diagnosis not present

## 2017-07-24 DIAGNOSIS — I10 Essential (primary) hypertension: Secondary | ICD-10-CM

## 2017-07-24 DIAGNOSIS — E782 Mixed hyperlipidemia: Secondary | ICD-10-CM

## 2017-07-24 NOTE — Patient Instructions (Signed)
Continue your current therapy  We will check blood work today  I will see you in 6 months. 

## 2017-07-25 ENCOUNTER — Telehealth: Payer: Self-pay | Admitting: *Deleted

## 2017-07-25 DIAGNOSIS — N289 Disorder of kidney and ureter, unspecified: Secondary | ICD-10-CM

## 2017-07-25 DIAGNOSIS — I10 Essential (primary) hypertension: Secondary | ICD-10-CM

## 2017-07-25 LAB — COMPREHENSIVE METABOLIC PANEL
A/G RATIO: 1.4 (ref 1.2–2.2)
ALBUMIN: 4 g/dL (ref 3.5–5.5)
ALK PHOS: 61 IU/L (ref 39–117)
ALT: 10 IU/L (ref 0–44)
AST: 15 IU/L (ref 0–40)
BUN / CREAT RATIO: 15 (ref 9–20)
BUN: 35 mg/dL — ABNORMAL HIGH (ref 6–24)
CO2: 24 mmol/L (ref 20–29)
CREATININE: 2.39 mg/dL — AB (ref 0.76–1.27)
Calcium: 9.3 mg/dL (ref 8.7–10.2)
Chloride: 101 mmol/L (ref 96–106)
GFR calc Af Amer: 33 mL/min/{1.73_m2} — ABNORMAL LOW (ref >59)
GFR calc non Af Amer: 29 mL/min/{1.73_m2} — ABNORMAL LOW (ref >59)
GLOBULIN, TOTAL: 2.8 g/dL (ref 1.5–4.5)
GLUCOSE: 137 mg/dL — AB (ref 65–99)
POTASSIUM: 4.8 mmol/L (ref 3.5–5.2)
SODIUM: 141 mmol/L (ref 134–144)
Total Protein: 6.8 g/dL (ref 6.0–8.5)

## 2017-07-25 LAB — LIPID PANEL
CHOLESTEROL TOTAL: 148 mg/dL (ref 100–199)
Chol/HDL Ratio: 4.5 ratio (ref 0.0–5.0)
HDL: 33 mg/dL — ABNORMAL LOW (ref >39)
LDL CALC: 77 mg/dL (ref 0–99)
Triglycerides: 188 mg/dL — ABNORMAL HIGH (ref 0–149)
VLDL Cholesterol Cal: 38 mg/dL (ref 5–40)

## 2017-07-25 LAB — HEMOGLOBIN A1C
ESTIMATED AVERAGE GLUCOSE: 174 mg/dL
Hgb A1c MFr Bld: 7.7 % — ABNORMAL HIGH (ref 4.8–5.6)

## 2017-07-25 NOTE — Telephone Encounter (Signed)
-----   Message from Peter M Martinique, MD sent at 07/25/2017  7:36 AM EDT ----- Renal function is significantly worse. Sugar is still high. A1c 7.7%. Lipids look much better. Needs to stop using any NSAIDs like Mobic. Hold losartan. Hydrate.  Repeat BMET in one week. Needs to see primary care for follow up of renal dysfunction and sugar. Looks like he needs to go on medication for diabetes.   Peter Martinique MD, [facility]

## 2017-07-25 NOTE — Telephone Encounter (Signed)
Notes recorded by Earvin Hansen, LPN on 7/98/1025 at 4:86 PM EDT Released in my chart with Dr Doug Sou comments attached and Left message to call back Monday 9/22

## 2017-07-29 NOTE — Telephone Encounter (Signed)
Patient returned phone call, I advised of lab results, advised that it was released to [facility] for him to go and look at the results.   Patient verbalized understanding of the changes to medications, no questions or concerns.

## 2017-08-05 ENCOUNTER — Other Ambulatory Visit: Payer: Self-pay

## 2017-08-05 MED ORDER — ROSUVASTATIN CALCIUM 40 MG PO TABS: 40.0000 mg | ORAL_TABLET | Freq: Every day | ORAL | 6 refills | Status: DC

## 2017-08-08 ENCOUNTER — Ambulatory Visit (INDEPENDENT_AMBULATORY_CARE_PROVIDER_SITE_OTHER): Payer: Medicare Other | Admitting: Nurse Practitioner

## 2017-08-08 ENCOUNTER — Telehealth: Payer: Self-pay | Admitting: Nurse Practitioner

## 2017-08-08 ENCOUNTER — Encounter: Payer: Self-pay | Admitting: Nurse Practitioner

## 2017-08-08 VITALS — BP 120/82 | HR 67 | Temp 98.4°F | Resp 16 | Ht 70.0 in | Wt 166.4 lb

## 2017-08-08 DIAGNOSIS — N183 Chronic kidney disease, stage 3 unspecified: Secondary | ICD-10-CM

## 2017-08-08 DIAGNOSIS — E1122 Type 2 diabetes mellitus with diabetic chronic kidney disease: Secondary | ICD-10-CM | POA: Diagnosis not present

## 2017-08-08 DIAGNOSIS — E1129 Type 2 diabetes mellitus with other diabetic kidney complication: Secondary | ICD-10-CM

## 2017-08-08 DIAGNOSIS — E119 Type 2 diabetes mellitus without complications: Secondary | ICD-10-CM | POA: Insufficient documentation

## 2017-08-08 MED ORDER — GLUCOSE BLOOD VI STRP: ORAL_STRIP | 12 refills | Status: DC

## 2017-08-08 MED ORDER — SITAGLIPTIN PHOSPHATE 50 MG PO TABS: 50.0000 mg | ORAL_TABLET | Freq: Every day | ORAL | 1 refills | Status: DC

## 2017-08-08 NOTE — Telephone Encounter (Signed)
After reviewing his chart, I do not see that he has a nephrologist. I would recommend referral to nephrology for his decreased kidney function, but I am not sure if he already has a kidney doctor at [email or web address] or plans to go to one there. Can you please find out if I need to place referral for him to nephrology here?

## 2017-08-08 NOTE — Telephone Encounter (Signed)
Rx resent with test strip frequency.

## 2017-08-08 NOTE — Progress Notes (Signed)
Name: Andrew Joyce   MRN: [id number]    DOB: [birth year]   Date:08/08/2017       Progress Note  Subjective  Chief Complaint  Chief Complaint  Patient presents with  . Follow-up    check for diabetes    HPI  Mr Coy is here today for follow up of elevated A1c and decreased renal function, noted on labs done by his cardiologist on 07/24/17. Since his last visit with me in January, he has suffered from recurrent UTIs and renal stones, followed by [facility] urology, and is currently awaiting oncology evaluation at [facility]  for bladder mass. He was instructed to come in today for evaluation of elevated A1c noted on recent cardiology lab work. He does not have a history of diabetes. He reports he has felt somewhat weaker lately when lifting weights at the gym but otherwise has not noticed  tremor, paresthesia, diaphoresis, polyuria, polydipsia, polyphagia. He reports he tries to eat a healthy diet at home, home cooked meals, no fried foots, lean meats.  Lab Results  Component Value Date   HGBA1C 7.7 (H) 07/24/2017    Patient Active Problem List   Diagnosis Date Noted  . Healthcare maintenance 01/21/2017  . Benign fasciculation-cramp syndrome 08/22/2015  . Chronic thoracic back pain 03/20/2015  . Fasciculation of lower extremity 11/18/2014  . Bilateral thoracic back pain 09/28/2014  . Nonallopathic lesion-rib cage 07/14/2014  . Nonallopathic lesion of thoracic region 07/14/2014  . Nonallopathic lesion of cervical region 07/14/2014  . Posture imbalance 04/13/2014  . CKD (chronic kidney disease) stage 2, GFR 60-89 ml/min 06/01/2013  . Angina pectoris ([place]) 09/18/2011  . Dyspnea 06/13/2011  . NSTEMI (non-ST elevated myocardial infarction) ([place]) 02/27/2011  . CAD (coronary artery disease) 02/06/2011  . HTN (hypertension) 02/06/2011  . Hyperlipidemia 02/06/2011    Past Surgical History:  Procedure Laterality Date  . CARDIAC CATHETERIZATION  07/28/2009   EF 60%; Grafts patent except for  SVG to the AM and PD. Native RCA is occluded.  [name] CARDIAC CATHETERIZATION  03/20/2006   EF 60-65%  . CARDIOVASCULAR STRESS TEST  06/12/2009   EF 71%, SMALL AREA OF INFARCT/ISCHEMIA IN INFERIOR WALL; FELT TO NOT BE CHANGED.  [name] CORONARY ARTERY BYPASS GRAFT  03/2006   LIMA GRAFT TO LAD, SAPHENOUS VEIN GRAFT TO THE ACUTE MARGINAL BRANCH THE RIGHT CORONARY, SAPHENOUS VEIN GRAFT TO THE PDA, SEQUENTIAL VEIN GRAFT TO THE FIRST AND SECOND OBTUSE MARGINAL VESSELS  . LEFT HEART CATHETERIZATION WITH CORONARY ANGIOGRAM N/A 02/26/2011   Procedure: LEFT HEART CATHETERIZATION WITH CORONARY ANGIOGRAM;  Surgeon: Thayer Headings, MD;  Location: [facility] CATH LAB;  Service: Cardiovascular;  Laterality: N/A;    Family History  Problem Relation Age of Onset  . Hyperlipidemia Mother        Living, 43  . Hypertension Mother   . Hyperlipidemia Father        Living 27  . Hypertension Father   . Healthy Sister   . Healthy Brother     Social History   Socioeconomic History  . Marital status: Married    Spouse name: Not on file  . Number of children: Not on file  . Years of education: Not on file  . Highest education level: Not on file  Occupational History  . Not on file  Social Needs  . Financial resource strain: Not on file  . Food insecurity:    Worry: Not on file    Inability: Not on file  . Transportation needs:  Medical: Not on file    Non-medical: Not on file  Tobacco Use  . Smoking status: Never Smoker  . Smokeless tobacco: Never Used  Substance and Sexual Activity  . Alcohol use: No    Alcohol/week: 0.0 oz  . Drug use: No  . Sexual activity: Yes  Lifestyle  . Physical activity:    Days per week: Not on file    Minutes per session: Not on file  . Stress: Not on file  Relationships  . Social connections:    Talks on phone: Not on file    Gets together: Not on file    Attends religious service: Not on file    Active member of club or organization: Not on file    Attends meetings of clubs  or organizations: Not on file    Relationship status: Not on file  . Intimate partner violence:    Fear of current or ex partner: Not on file    Emotionally abused: Not on file    Physically abused: Not on file    Forced sexual activity: Not on file  Other Topics Concern  . Not on file  Social History Narrative   Lives with wife and 2 children in a 2 story home.     Education: college.     Retired Physiological scientist.      Current Outpatient Medications:  .  allopurinol (ZYLOPRIM) 100 MG tablet, Take 1 tablet (100 mg total) by mouth daily., Disp: 30 tablet, Rfl: 6 .  amLODipine (NORVASC) 5 MG tablet, Take 1 tablet (5 mg total) by mouth daily., Disp: 30 tablet, Rfl: 6 .  amoxicillin (AMOXIL) 500 MG capsule, TAKE 1 CAPSULE BY MOUTH THREE TIMES DAILY FOR 10 DAYS, Disp: , Rfl: 0 .  aspirin 81 MG tablet, Take 1 tablet (81 mg total) by mouth daily., Disp: , Rfl:  .  baclofen (LIORESAL) 10 MG tablet, Take 2 tablets (20 mg total) by mouth 3 (three) times daily., Disp: 180 tablet, Rfl: 11 .  carbamazepine (TEGRETOL) 200 MG tablet, Take 200 mg by mouth 3 (three) times daily., Disp: , Rfl:  .  EPITOL 200 MG tablet, TAKE 1 TABLET BY MOUTH TWICE DAILY, Disp: 60 tablet, Rfl: 3 .  fenofibrate 160 MG tablet, TAKE 1 TABLET BY MOUTH ONCE DAILY, Disp: 90 tablet, Rfl: 1 .  fish oil-omega-3 fatty acids 1000 MG capsule, Take 2 capsules (2 g total) by mouth 2 (two) times daily., Disp: , Rfl:  .  isosorbide mononitrate (IMDUR) 60 MG 24 hr tablet, TAKE 1 & 1/2 (ONE & ONE-HALF) TABLETS BY MOUTH ONCE DAILY, Disp: 135 tablet, Rfl: 2 .  levothyroxine (SYNTHROID, LEVOTHROID) 25 MCG tablet, Take 1 tablet (25 mcg total) by mouth daily., Disp: 30 tablet, Rfl: 1 .  losartan (COZAAR) 100 MG tablet, TAKE 1 TABLET BY MOUTH ONCE DAILY, Disp: 90 tablet, Rfl: 0 .  meloxicam (MOBIC) 15 MG tablet, Take 1 tablet (15 mg total) by mouth daily., Disp: 30 tablet, Rfl: 1 .  methenamine (MANDELAMINE) 1 g tablet, Take 1,000 mg by mouth 3  (three) times daily. , Disp: , Rfl:  .  metoprolol tartrate (LOPRESSOR) 50 MG tablet, Take 1 tablet (50 mg total) by mouth 2 (two) times daily., Disp: 120 tablet, Rfl: 3 .  nitroGLYCERIN (NITROSTAT) 0.4 MG SL tablet, Place 1 tablet (0.4 mg total) under the tongue every 5 (five) minutes as needed. For chest pain, Disp: 25 tablet, Rfl: 3 .  rOPINIRole (REQUIP) 0.25 MG tablet, TAKE  1 TABLET BY MOUTH 3 HOURS BEFORE BEDTIME, Disp: 30 tablet, Rfl: 5 .  rosuvastatin (CRESTOR) 40 MG tablet, Take 1 tablet (40 mg total) by mouth daily., Disp: 30 tablet, Rfl: 6  No Known Allergies   ROS See HPI  Objective  Vitals:   08/08/17 0931  BP: 120/82  Pulse: 67  Resp: 16  Temp: 98.4 F (36.9 C)  TempSrc: Oral  SpO2: 96%  Weight: 166 lb 6.4 oz (75.5 kg)  Height: 5\' 10"  (1.778 m)    Body mass index is 23.88 kg/m.  Physical Exam Vital signs reviewed. Constitutional: Patient appears well-developed and well-nourished. No distress.  HENT: Head: Normocephalic and atraumatic. Nose: Nose normal. Mouth/Throat: Oropharynx is clear and moist. No oropharyngeal exudate.  Eyes: Conjunctivae and EOM are normal. Pupils are equal, round, and reactive to light. No scleral icterus.  Neck: Normal range of motion. Neck supple.  Cardiovascular: Normal rate, regular rhythm and normal heart sounds.  No murmur heard. No BLE edema. Distal pulses intact. Pulmonary/Chest: Effort normal and breath sounds normal. No respiratory distress. Neurological: He is alert and oriented to person, place, and time. No cranial nerve deficit. Coordination, balance, strength, speech and gait are normal.  Skin: Skin is warm and dry. No rash noted. No erythema.  Psychiatric: Patient has a normal mood and affect. behavior is normal. Judgment and thought content normal.   Assessment & Plan

## 2017-08-08 NOTE — Assessment & Plan Note (Addendum)
Worsening renal function noted on recent labs by cardiology He is followed by multiple specialists at [facility] with recent new referrals related to a bladder mass, I have reached out to him via phone call after his visit today to clarify wether he is seeing a nephrologist already. If he is not, will have him stop by for repeat BMET next week to determine further plan of care

## 2017-08-08 NOTE — Patient Instructions (Addendum)
Please start januvia 50mg  once daily for diabetes.  Please record your blood sugars in a log twice daily, once in the morning when you first wake up up before eating anything and then again 2 hours after dinner or before bedtime. The goal is to keep your blood sugars below 150 consistently  Please return in about 1 month for follow, so I can see how you are doing on the januvia. Please follow up sooner for blood sugars <100 or >200   Diabetes Mellitus and Nutrition When you have diabetes (diabetes mellitus), it is very important to have healthy eating habits because your blood sugar (glucose) levels are greatly affected by what you eat and drink. Eating healthy foods in the appropriate amounts, at about the same times every day, can help you:  Control your blood glucose.  Lower your risk of heart disease.  Improve your blood pressure.  Reach or maintain a healthy weight.  Every person with diabetes is different, and each person has different needs for a meal plan. Your health care provider may recommend that you work with a diet and nutrition specialist (dietitian) to make a meal plan that is best for you. Your meal plan may vary depending on factors such as:  The calories you need.  The medicines you take.  Your weight.  Your blood glucose, blood pressure, and cholesterol levels.  Your activity level.  Other health conditions you have, such as heart or kidney disease.  How do carbohydrates affect me? Carbohydrates affect your blood glucose level more than any other type of food. Eating carbohydrates naturally increases the amount of glucose in your blood. Carbohydrate counting is a method for keeping track of how many carbohydrates you eat. Counting carbohydrates is important to keep your blood glucose at a healthy level, especially if you use insulin or take certain oral diabetes medicines. It is important to know how many carbohydrates you can safely have in each meal. This is  different for every person. Your dietitian can help you calculate how many carbohydrates you should have at each meal and for snack. Foods that contain carbohydrates include:  Bread, cereal, rice, pasta, and crackers.  Potatoes and corn.  Peas, beans, and lentils.  Milk and yogurt.  Fruit and juice.  Desserts, such as cakes, cookies, ice cream, and candy.  How does alcohol affect me? Alcohol can cause a sudden decrease in blood glucose (hypoglycemia), especially if you use insulin or take certain oral diabetes medicines. Hypoglycemia can be a life-threatening condition. Symptoms of hypoglycemia (sleepiness, dizziness, and confusion) are similar to symptoms of having too much alcohol. If your health care provider says that alcohol is safe for you, follow these guidelines:  Limit alcohol intake to no more than 1 drink per day for nonpregnant women and 2 drinks per day for men. One drink equals 12 oz of beer, 5 oz of wine, or 1 oz of hard liquor.  Do not drink on an empty stomach.  Keep yourself hydrated with water, diet soda, or unsweetened iced tea.  Keep in mind that regular soda, juice, and other mixers may contain a lot of sugar and must be counted as carbohydrates.  What are tips for following this plan? Reading food labels  Start by checking the serving size on the label. The amount of calories, carbohydrates, fats, and other nutrients listed on the label are based on one serving of the food. Many foods contain more than one serving per package.  Check the total grams (  g) of carbohydrates in one serving. You can calculate the number of servings of carbohydrates in one serving by dividing the total carbohydrates by 15. For example, if a food has 30 g of total carbohydrates, it would be equal to 2 servings of carbohydrates.  Check the number of grams (g) of saturated and trans fats in one serving. Choose foods that have low or no amount of these fats.  Check the number of  milligrams (mg) of sodium in one serving. Most people should limit total sodium intake to less than 2,300 mg per day.  Always check the nutrition information of foods labeled as "low-fat" or "nonfat". These foods may be higher in added sugar or refined carbohydrates and should be avoided.  Talk to your dietitian to identify your daily goals for nutrients listed on the label. Shopping  Avoid buying canned, premade, or processed foods. These foods tend to be high in fat, sodium, and added sugar.  Shop around the outside edge of the grocery store. This includes fresh fruits and vegetables, bulk grains, fresh meats, and fresh dairy. Cooking  Use low-heat cooking methods, such as baking, instead of high-heat cooking methods like deep frying.  Cook using healthy oils, such as olive, canola, or sunflower oil.  Avoid cooking with butter, cream, or high-fat meats. Meal planning  Eat meals and snacks regularly, preferably at the same times every day. Avoid going long periods of time without eating.  Eat foods high in fiber, such as fresh fruits, vegetables, beans, and whole grains. Talk to your dietitian about how many servings of carbohydrates you can eat at each meal.  Eat 4-6 ounces of lean protein each day, such as lean meat, chicken, fish, eggs, or tofu. 1 ounce is equal to 1 ounce of meat, chicken, or fish, 1 egg, or 1/4 cup of tofu.  Eat some foods each day that contain healthy fats, such as avocado, nuts, seeds, and fish. Lifestyle   Check your blood glucose regularly.  Exercise at least 30 minutes 5 or more days each week, or as told by your health care provider.  Take medicines as told by your health care provider.  Do not use any products that contain nicotine or tobacco, such as cigarettes and e-cigarettes. If you need help quitting, ask your health care provider.  Work with a Social worker or diabetes educator to identify strategies to manage stress and any emotional and social  challenges. What are some questions to ask my health care provider?  Do I need to meet with a diabetes educator?  Do I need to meet with a dietitian?  What number can I call if I have questions?  When are the best times to check my blood glucose? Where to find more information:  American Diabetes Association: diabetes.org/food-and-fitness/food  Academy of Nutrition and Dietetics: PokerClues.dk  [employer] of Diabetes and Digestive and Kidney Diseases (NIH): ContactWire.be Summary  A healthy meal plan will help you control your blood glucose and maintain a healthy lifestyle.  Working with a diet and nutrition specialist (dietitian) can help you make a meal plan that is best for you.  Keep in mind that carbohydrates and alcohol have immediate effects on your blood glucose levels. It is important to count carbohydrates and to use alcohol carefully. This information is not intended to replace advice given to you by your health care provider. Make sure you discuss any questions you have with your health care provider. Document Released: 09/20/2004 Document Revised: 01/29/2016 Document Reviewed: 01/29/2016  Chartered certified accountant Patient Education  [employer].

## 2017-08-08 NOTE — Telephone Encounter (Signed)
Copied from [place] [id number]. Topic: Quick Communication - See Telephone Encounter >> Aug 08, 2017  1:02 PM Synthia Innocent wrote: CRM for notification. See Telephone encounter for: 08/08/17.Need to verify dosage of glucose blood (ACCU-CHEK GUIDE) test strip. Please advise

## 2017-08-08 NOTE — Assessment & Plan Note (Signed)
Due to severe renal dysfunction, can not start metformin. We will start januvia- dosing and side effects discussed. Currently holding losartan due to renal function per cardiology notes. Discussed the role of healthy diet and exercise in the management of diabetes and printed additional information on AVS He would like glucometer to check sugars at home, Glucometer given today with instructions for use by CMA RTC in 1 month for F/U: check glucose logs, update renal function testing - sitaGLIPtin (JANUVIA) 50 MG tablet; Take 1 tablet (50 mg total) by mouth daily.  Dispense: 30 tablet; Refill: 1

## 2017-08-11 ENCOUNTER — Other Ambulatory Visit: Payer: Self-pay

## 2017-08-11 DIAGNOSIS — C678 Malignant neoplasm of overlapping sites of bladder: Secondary | ICD-10-CM | POA: Diagnosis not present

## 2017-08-11 DIAGNOSIS — C679 Malignant neoplasm of bladder, unspecified: Secondary | ICD-10-CM | POA: Diagnosis not present

## 2017-08-11 MED ORDER — GLUCOSE BLOOD VI STRP: ORAL_STRIP | 12 refills | Status: AC

## 2017-08-11 NOTE — Telephone Encounter (Signed)
LVM for pt to call back in regards.  

## 2017-08-15 DIAGNOSIS — R590 Localized enlarged lymph nodes: Secondary | ICD-10-CM | POA: Diagnosis not present

## 2017-08-15 DIAGNOSIS — N2882 Megaloureter: Secondary | ICD-10-CM | POA: Diagnosis not present

## 2017-08-15 DIAGNOSIS — R9341 Abnormal radiologic findings on diagnostic imaging of renal pelvis, ureter, or bladder: Secondary | ICD-10-CM | POA: Diagnosis not present

## 2017-08-15 DIAGNOSIS — C678 Malignant neoplasm of overlapping sites of bladder: Secondary | ICD-10-CM | POA: Diagnosis not present

## 2017-08-15 DIAGNOSIS — Z936 Other artificial openings of urinary tract status: Secondary | ICD-10-CM | POA: Diagnosis not present

## 2017-08-18 ENCOUNTER — Other Ambulatory Visit: Payer: Self-pay | Admitting: Neurology

## 2017-08-18 DIAGNOSIS — C679 Malignant neoplasm of bladder, unspecified: Secondary | ICD-10-CM | POA: Diagnosis not present

## 2017-08-19 ENCOUNTER — Telehealth: Payer: Self-pay | Admitting: Oncology

## 2017-08-19 ENCOUNTER — Telehealth: Payer: Self-pay | Admitting: Neurology

## 2017-08-19 ENCOUNTER — Encounter: Payer: Self-pay | Admitting: Oncology

## 2017-08-19 NOTE — Telephone Encounter (Signed)
Pt cld and confirmed appt with Dr. Alen Blew on 8/29 at 2pm.

## 2017-08-19 NOTE — Telephone Encounter (Signed)
I called the pharmacy and they said that the patient's med is on back order so they will need to give him the same med but made by a different manufacturer.

## 2017-08-19 NOTE — Telephone Encounter (Signed)
Pharmacy called needing to get permission to switch medication brand? Please Call. Thanks

## 2017-08-19 NOTE — Telephone Encounter (Signed)
New oncology referral received from Dr. Kae Heller at [facility]. I received a call from Andrew Joyce to schedule an appt. Appt has been scheduled for the pt to see Dr. Alen Blew on 8/29 at 2pm. I left the appt date and time on the patient's vm. Letter mailed.

## 2017-09-04 ENCOUNTER — Inpatient Hospital Stay: Payer: Medicare Other | Attending: Oncology | Admitting: Oncology

## 2017-09-04 VITALS — BP 115/61 | HR 63 | Temp 98.6°F | Resp 18 | Ht 70.0 in | Wt 159.7 lb

## 2017-09-04 DIAGNOSIS — C775 Secondary and unspecified malignant neoplasm of intrapelvic lymph nodes: Secondary | ICD-10-CM | POA: Diagnosis not present

## 2017-09-04 DIAGNOSIS — N1330 Unspecified hydronephrosis: Secondary | ICD-10-CM

## 2017-09-04 DIAGNOSIS — C679 Malignant neoplasm of bladder, unspecified: Secondary | ICD-10-CM | POA: Diagnosis not present

## 2017-09-04 DIAGNOSIS — I2510 Atherosclerotic heart disease of native coronary artery without angina pectoris: Secondary | ICD-10-CM | POA: Diagnosis not present

## 2017-09-04 DIAGNOSIS — N189 Chronic kidney disease, unspecified: Secondary | ICD-10-CM

## 2017-09-04 DIAGNOSIS — Z7189 Other specified counseling: Secondary | ICD-10-CM

## 2017-09-04 MED ORDER — PROCHLORPERAZINE MALEATE 10 MG PO TABS: 10.0000 mg | ORAL_TABLET | Freq: Four times a day (QID) | ORAL | 0 refills | Status: DC | PRN

## 2017-09-04 MED ORDER — LIDOCAINE-PRILOCAINE 2.5-2.5 % EX CREA: 1.0000 "application " | TOPICAL_CREAM | CUTANEOUS | 0 refills | Status: DC | PRN

## 2017-09-04 NOTE — Progress Notes (Signed)
Reason for the request: Bladder cancer     HPI: I was asked by Dr. Birdena Jubilee   to evaluate Andrew Joyce for a recent diagnosis of bladder cancer.  He is a 57 year old man with history of congenital bladder exstrophy and multiple bladder issues including recurrent stones.  He has also history of coronary artery disease as well as chronic renal insufficiency.  He presented with bladder pain and discomfort and a CT scan obtained in October 2018 showed focal masslike tumor in the anterior aspect of his bladder.  Subsequently had an ultrasound in June 2019 which showed a 4.7 cm anterior bladder wall with moderate right-sided hydronephrosis.  He had a repeat scan on July 07, 2017 which showed a 6.0 cm bladder mass with extraluminal extension with direct invasion into the anterior pelvic wall musculature.  1.4 cm right external iliac lymph node with additionally nonenlarged retroperitoneal lymph node was noted.  He underwent a cystoscopy and TURBT on July 23, 2017 at [facility].  The pathology showed high-grade urothelial carcinoma with prominent squamous differentiation and myxoid stroma suggestive of sarcomatoid carcinoma.  PET CT scan obtained in August 2019 showed a large bladder mass which appears to invade the lower rectus musculature as well as enlarged necrotic right external iliac lymph node which demonstrate intense FDG uptake.  He did have bilateral nephrostomy tube placement in July 2019 given his bilateral hydronephrosis.  Based on these findings, patient was evaluated for possible radical cystectomy after neoadjuvant systemic chemotherapy.  It was recommended that he receives neoadjuvant chemotherapy given his locally advanced disease.  He was evaluated by Dr. Vito Berger I recommended treating with gemcitabine and carboplatin given his chronic renal insufficiency.  Clinically, he has lost 14 pounds since his diagnosis and does report some bladder discomfort that he takes Ultram.  He denies any  hematuria or dysuria.  He does report some mild fatigue but otherwise asymptomatic.  He remains active and continues to attend activities of daily living.  He does not report any headaches, blurry vision, syncope or seizures. Does not report any fevers, chills or sweats.  Does not report any cough, wheezing or hemoptysis.  Does not report any chest pain, palpitation, orthopnea or leg edema.  Does not report any nausea, vomiting or abdominal pain.  Does not report any constipation or diarrhea.  Does not report any skeletal complaints.    He denies any hematuria.  Does not report any skin rashes or lesions. Does not report any heat or cold intolerance.  Does not report any lymphadenopathy or petechiae.  Does not report any anxiety or depression.  Remaining review of systems is negative.    Past Medical History:  Diagnosis Date  . 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 Chronic kidney disease (CKD), stage II (mild)   . Congenital birth defect   . Coronary artery disease    a. s/p CABG in 2008; b.  s/p cath in 2011 for med rx.;  c.  NSTEMI (02/2011):  LHC (02/2011):  LAD occluded, distal LAD filled via left to left collaterals, distal CFX 40-50%, proximal RCA occluded (right to right collaterals), distal RCA occluded, S-RCA occluded proximally, S-OM1/OM2 occluded (new from 2011 - culprit), L-LAD ok, ant apical HK, EF 45-50% - med Rx.  [name] Dyslipidemia   . History of renal stone   . Hx of cardiovascular stress test    Nuclear (06/2009):  EF 71%, small area of infarct/ischemia in inferior wall-unchanged from  prior  . Hx of echocardiogram    Echo (05/2013):  EF 55-60%, inf HK, mild AI, normal RVSF, RVSP 28 mmHg  . Hypertension   . Pancreatitis   . Recurrent UTI   :  Past Surgical History:  Procedure Laterality Date  . CARDIAC CATHETERIZATION  07/28/2009   EF 60%; Grafts patent except for SVG to the AM and PD. Native RCA is occluded.  [name] CARDIAC CATHETERIZATION  03/20/2006    EF 60-65%  . CARDIOVASCULAR STRESS TEST  06/12/2009   EF 71%, SMALL AREA OF INFARCT/ISCHEMIA IN INFERIOR WALL; FELT TO NOT BE CHANGED.  [name] CORONARY ARTERY BYPASS GRAFT  03/2006   LIMA GRAFT TO LAD, SAPHENOUS VEIN GRAFT TO THE ACUTE MARGINAL BRANCH THE RIGHT CORONARY, SAPHENOUS VEIN GRAFT TO THE PDA, SEQUENTIAL VEIN GRAFT TO THE FIRST AND SECOND OBTUSE MARGINAL VESSELS  . LEFT HEART CATHETERIZATION WITH CORONARY ANGIOGRAM N/A 02/26/2011   Procedure: LEFT HEART CATHETERIZATION WITH CORONARY ANGIOGRAM;  Surgeon: Thayer Headings, MD;  Location: [facility] CATH LAB;  Service: Cardiovascular;  Laterality: N/A;  :   Current Outpatient Medications:  .  allopurinol (ZYLOPRIM) 100 MG tablet, Take 1 tablet (100 mg total) by mouth daily., Disp: 30 tablet, Rfl: 6 .  amLODipine (NORVASC) 5 MG tablet, Take 1 tablet (5 mg total) by mouth daily., Disp: 30 tablet, Rfl: 6 .  amoxicillin (AMOXIL) 250 MG capsule, Take 250 mg by mouth daily., Disp: , Rfl: 11 .  aspirin 81 MG tablet, Take 1 tablet (81 mg total) by mouth daily., Disp: , Rfl:  .  baclofen (LIORESAL) 10 MG tablet, Take 2 tablets (20 mg total) by mouth 3 (three) times daily., Disp: 180 tablet, Rfl: 11 .  carbamazepine (TEGRETOL) 200 MG tablet, TAKE 1 TABLET BY MOUTH TWICE DAILY, Disp: 60 tablet, Rfl: 5 .  fenofibrate 160 MG tablet, TAKE 1 TABLET BY MOUTH ONCE DAILY, Disp: 90 tablet, Rfl: 1 .  fish oil-omega-3 fatty acids 1000 MG capsule, Take 2 capsules (2 g total) by mouth 2 (two) times daily., Disp: , Rfl:  .  glucose blood (ACCU-CHEK GUIDE) test strip, Use to check blood sugars 2 times daily. Dx Code: E11.9, Disp: 100 each, Rfl: 12 .  isosorbide mononitrate (IMDUR) 60 MG 24 hr tablet, TAKE 1 & 1/2 (ONE & ONE-HALF) TABLETS BY MOUTH ONCE DAILY, Disp: 135 tablet, Rfl: 2 .  levothyroxine (SYNTHROID, LEVOTHROID) 25 MCG tablet, Take 1 tablet (25 mcg total) by mouth daily., Disp: 30 tablet, Rfl: 1 .  losartan (COZAAR) 100 MG tablet, TAKE 1 TABLET BY MOUTH ONCE DAILY,  Disp: 90 tablet, Rfl: 0 .  meloxicam (MOBIC) 15 MG tablet, Take 1 tablet (15 mg total) by mouth daily., Disp: 30 tablet, Rfl: 1 .  methenamine (MANDELAMINE) 1 g tablet, Take 1,000 mg by mouth 3 (three) times daily. , Disp: , Rfl:  .  metoprolol tartrate (LOPRESSOR) 50 MG tablet, Take 1 tablet (50 mg total) by mouth 2 (two) times daily., Disp: 120 tablet, Rfl: 3 .  rOPINIRole (REQUIP) 0.25 MG tablet, TAKE 1 TABLET BY MOUTH 3 HOURS BEFORE BEDTIME, Disp: 30 tablet, Rfl: 5 .  rosuvastatin (CRESTOR) 40 MG tablet, Take 1 tablet (40 mg total) by mouth daily., Disp: 30 tablet, Rfl: 6 .  sitaGLIPtin (JANUVIA) 50 MG tablet, Take 1 tablet (50 mg total) by mouth daily., Disp: 30 tablet, Rfl: 1 .  lidocaine-prilocaine (EMLA) cream, Apply 1 application topically as needed., Disp: 30 g, Rfl: 0 .  nitroGLYCERIN (NITROSTAT) 0.4 MG SL  tablet, Place 1 tablet (0.4 mg total) under the tongue every 5 (five) minutes as needed. For chest pain (Patient not taking: Reported on 09/04/2017), Disp: 25 tablet, Rfl: 3 .  prochlorperazine (COMPAZINE) 10 MG tablet, Take 1 tablet (10 mg total) by mouth every 6 (six) hours as needed for nausea or vomiting., Disp: 30 tablet, Rfl: 0:  No Known Allergies:  Family History  Problem Relation Age of Onset  . Hyperlipidemia Mother        Living, 54  . Hypertension Mother   . Hyperlipidemia Father        Living 75  . Hypertension Father   . Healthy Sister   . Healthy Brother   :  Social History   Socioeconomic History  . Marital status: Married    Spouse name: Not on file  . Number of children: Not on file  . Years of education: Not on file  . Highest education level: Not on file  Occupational History  . Not on file  Social Needs  . Financial resource strain: Not on file  . Food insecurity:    Worry: Not on file    Inability: Not on file  . Transportation needs:    Medical: Not on file    Non-medical: Not on file  Tobacco Use  . Smoking status: Never Smoker  .  Smokeless tobacco: Never Used  Substance and Sexual Activity  . Alcohol use: No    Alcohol/week: 0.0 standard drinks  . Drug use: No  . Sexual activity: Yes  Lifestyle  . Physical activity:    Days per week: Not on file    Minutes per session: Not on file  . Stress: Not on file  Relationships  . Social connections:    Talks on phone: Not on file    Gets together: Not on file    Attends religious service: Not on file    Active member of club or organization: Not on file    Attends meetings of clubs or organizations: Not on file    Relationship status: Not on file  . Intimate partner violence:    Fear of current or ex partner: Not on file    Emotionally abused: Not on file    Physically abused: Not on file    Forced sexual activity: Not on file  Other Topics Concern  . Not on file  Social History Narrative   Lives with wife and 2 children in a 2 story home.     Education: college.     Retired Physiological scientist.   :  Pertinent items are noted in HPI.  Exam: Blood pressure 115/61, pulse 63, temperature 98.6 F (37 C), temperature source Oral, resp. rate 18, height 5\' 10"  (1.778 m), weight 159 lb 11.2 oz (72.4 kg), SpO2 100 %.  ECOG 0 General appearance: alert and cooperative appeared without distress. Head: atraumatic without any abnormalities. Eyes: conjunctivae/corneas clear. PERRL.  Sclera anicteric. Throat: lips, mucosa, and tongue normal; without oral thrush or ulcers. Resp: clear to auscultation bilaterally without rhonchi, wheezes or dullness to percussion. Cardio: regular rate and rhythm, S1, S2 normal, no murmur, click, rub or gallop GI: soft, non-tender; bowel sounds normal; no masses,  no organomegaly Skin: Skin color, texture, turgor normal. No rashes or lesions Lymph nodes: Cervical, supraclavicular, and axillary nodes normal. Neurologic: Grossly normal without any motor, sensory or deep tendon reflexes. Musculoskeletal: No joint deformity or  effusion.  CBC    Component Value Date/Time   WBC 5.8  01/30/2017 0918   WBC 5.4 06/01/2013 0945   RBC 4.51 01/30/2017 0918   RBC 4.30 06/01/2013 0945   HGB 14.1 01/30/2017 0918   HCT 42.2 01/30/2017 0918   PLT 217 01/30/2017 0918   MCV 94 01/30/2017 0918   MCH 31.3 01/30/2017 0918   MCH 30.6 02/28/2011 0600   MCHC 33.4 01/30/2017 0918   MCHC 34.3 06/01/2013 0945   RDW 14.0 01/30/2017 0918   LYMPHSABS 1.6 01/30/2017 0918   MONOABS 0.5 06/01/2013 0945   EOSABS 0.3 01/30/2017 0918   BASOSABS 0.0 01/30/2017 0918     Chemistry      Component Value Date/Time   NA 141 07/24/2017 0942   K 4.8 07/24/2017 0942   CL 101 07/24/2017 0942   CO2 24 07/24/2017 0942   BUN 35 (H) 07/24/2017 0942   CREATININE 2.39 (H) 07/24/2017 0942   CREATININE 1.38 (H) 03/01/2015 0942      Component Value Date/Time   CALCIUM 9.3 07/24/2017 0942   ALKPHOS 61 07/24/2017 0942   AST 15 07/24/2017 0942   ALT 10 07/24/2017 0942   BILITOT <0.2 07/24/2017 0942       Assessment and Plan:   57 year old man with the following:  1.  Locally advanced high-grade urothelial carcinoma of the bladder diagnosed in July 2019.  He was also found to have iliac lymph node involvement indicating stage IVa disease.  The final pathology of his TURBT obtained in July 2019 indicated squamous differentiation and possible sarcomatoid features.  The natural course of this disease was discussed today with the patient and his wife.  He has clear aggressive histology that requires aggressive systemic chemotherapy as soon as possible.  Neoadjuvant chemotherapy followed for potential cystectomy he has a reasonable response is certainly a reasonable approach given his age and tumor presentation.  Complication associated with chemotherapy was reviewed today in detail.  Side effects associated with gemcitabine and carboplatin chemotherapy include nausea, vomiting, myelosuppression, neutropenia, neutropenic sepsis and rarely severe  illness and hospitalization.  Blood product support and growth factor support may be needed.  Infusion related complications and renal insufficiency was also reviewed.  After discussion today, he is agreeable to proceed after chemotherapy education class.  2.  IV access: Risks and benefits of Port-A-Cath insertion was reviewed today.  Complications that include bleeding, thrombosis and infection were discussed.  He is agreeable to proceed.  3.  Antiemetics: Prescription for Compazine was made available to him.  4.  Chronic renal insufficiency: Appears that not only related to a bilateral hydronephrosis as his kidney function remains compromised even with bilateral hydronephrosis.  His creatinine clearance is around 30 cc/min.  We will continue to monitor this on carboplatin therapy.  5.  Goals of care: Despite his locally advanced disease the goal of therapy remains curative at this time.  6.  Follow-up: We will be in the immediate future to start therapy after his Port-A-Cath is inserted.   60  minutes was spent with the patient face-to-face today.  More than 50% of time was dedicated to discussing the natural course of his disease, logistics of chemotherapy administration and coordinating his plan of care.    Thank you for the referral. A copy of this consult has been forwarded to the requesting physician.

## 2017-09-04 NOTE — Progress Notes (Signed)
START ON PATHWAY REGIMEN - Bladder     A cycle is every 21 days:     Carboplatin      Gemcitabine   **Always confirm dose/schedule in your pharmacy ordering system**  Patient Characteristics: Metastatic Disease, First Line, No Prior Neoadjuvant/Adjuvant Therapy, Poor Renal Function (CrCl < 50 mL/min), Unknown PD-L1 Expression AJCC M Category: M1 AJCC N Category: NX AJCC T Category: TX Current evidence of distant metastases<= Yes AJCC 8 Stage Grouping: IVA Line of Therapy: First Line Prior Neoadjuvant/Adjuvant Therapy<= No Renal Function: Poor Renal Function (CrCl < 50 mL/min) PD-L1 Expression Status: Unknown PD-L1 Expression Intent of Therapy: Curative Intent, Discussed with Patient

## 2017-09-08 ENCOUNTER — Other Ambulatory Visit: Payer: Self-pay | Admitting: Cardiology

## 2017-09-09 ENCOUNTER — Other Ambulatory Visit: Payer: Self-pay

## 2017-09-09 MED ORDER — METOPROLOL TARTRATE 50 MG PO TABS: 50.0000 mg | ORAL_TABLET | Freq: Two times a day (BID) | ORAL | 1 refills | Status: DC

## 2017-09-09 MED ORDER — METOPROLOL TARTRATE 50 MG PO TABS: 50.0000 mg | ORAL_TABLET | Freq: Two times a day (BID) | ORAL | 3 refills | Status: DC

## 2017-09-10 ENCOUNTER — Inpatient Hospital Stay: Payer: Medicare Other | Attending: Oncology

## 2017-09-10 DIAGNOSIS — D649 Anemia, unspecified: Secondary | ICD-10-CM | POA: Insufficient documentation

## 2017-09-10 DIAGNOSIS — Z79899 Other long term (current) drug therapy: Secondary | ICD-10-CM | POA: Insufficient documentation

## 2017-09-10 DIAGNOSIS — Z7189 Other specified counseling: Secondary | ICD-10-CM | POA: Insufficient documentation

## 2017-09-10 DIAGNOSIS — C775 Secondary and unspecified malignant neoplasm of intrapelvic lymph nodes: Secondary | ICD-10-CM | POA: Insufficient documentation

## 2017-09-10 DIAGNOSIS — N189 Chronic kidney disease, unspecified: Secondary | ICD-10-CM | POA: Insufficient documentation

## 2017-09-10 DIAGNOSIS — C679 Malignant neoplasm of bladder, unspecified: Secondary | ICD-10-CM | POA: Insufficient documentation

## 2017-09-10 DIAGNOSIS — Z5111 Encounter for antineoplastic chemotherapy: Secondary | ICD-10-CM | POA: Insufficient documentation

## 2017-09-10 NOTE — Telephone Encounter (Signed)
Rx request sent to pharmacy.  

## 2017-09-12 ENCOUNTER — Other Ambulatory Visit (INDEPENDENT_AMBULATORY_CARE_PROVIDER_SITE_OTHER): Payer: Medicare Other

## 2017-09-12 ENCOUNTER — Ambulatory Visit (INDEPENDENT_AMBULATORY_CARE_PROVIDER_SITE_OTHER): Payer: Medicare Other | Admitting: Nurse Practitioner

## 2017-09-12 ENCOUNTER — Encounter: Payer: Self-pay | Admitting: Nurse Practitioner

## 2017-09-12 VITALS — BP 114/78 | HR 72 | Ht 70.0 in | Wt 155.0 lb

## 2017-09-12 DIAGNOSIS — N183 Chronic kidney disease, stage 3 unspecified: Secondary | ICD-10-CM

## 2017-09-12 DIAGNOSIS — E1129 Type 2 diabetes mellitus with other diabetic kidney complication: Secondary | ICD-10-CM | POA: Diagnosis not present

## 2017-09-12 DIAGNOSIS — G4700 Insomnia, unspecified: Secondary | ICD-10-CM

## 2017-09-12 LAB — BASIC METABOLIC PANEL
BUN: 35 mg/dL — AB (ref 6–23)
CALCIUM: 10.1 mg/dL (ref 8.4–10.5)
CO2: 28 meq/L (ref 19–32)
Chloride: 101 mEq/L (ref 96–112)
Creatinine, Ser: 2.17 mg/dL — ABNORMAL HIGH (ref 0.40–1.50)
GFR: 33.23 mL/min — AB (ref >60.00)
GLUCOSE: 164 mg/dL — AB (ref 70–99)
Potassium: 4.6 mEq/L (ref 3.5–5.1)
Sodium: 137 mEq/L (ref 135–145)

## 2017-09-12 MED ORDER — TRAZODONE HCL 50 MG PO TABS: 25.0000 mg | ORAL_TABLET | Freq: Every evening | ORAL | 0 refills | Status: DC | PRN

## 2017-09-12 NOTE — Patient Instructions (Signed)
Please head downstairs for lab work. If any of your test results are critically abnormal, you will be contacted right away. Otherwise, I will contact you within a week about your test results and follow up recommendations  Please return around November for follow up, we will recheck your A1c and see how you are doing.

## 2017-09-12 NOTE — Assessment & Plan Note (Signed)
Glucose readings appear stable on home logs Continue januvia at current dosage RTC in 2 months for F/U: recheck R0C - Basic metabolic panel; Future

## 2017-09-12 NOTE — Assessment & Plan Note (Signed)
Update BMET today F/U with further recommendations pending lab results - Basic metabolic panel; Future

## 2017-09-12 NOTE — Progress Notes (Signed)
Name: Andrew Joyce   MRN: [id number]    DOB: [birth year]   Date:09/12/2017       Progress Note  Subjective  Chief Complaint Follow up   HPI Andrew Joyce is here today for diabetes follow up, since our last OV he has been diagnosed with bladder mass, and is scheduled to start chemo with [facility] oncology next week. He is here today for a follow up of diabetes and decreased renal function, first noted by his cardiologist on lab work in July. He also tells me that hes been having trouble sleeping over past months, since he was diagnosed with bladder cancer, says he just tosses and turns and can't get comfortable to sleep at night. He has tried several OTC sleep aids which have not improved his sleep quality. At his last OV on 08/08/17, we started on januvia 50 daily for diabetes,  Reports he has been taking [place] daily as instructed without noted adverse medication effects. He has been checking his blood sugars daily since starting the januvia, readings have been 100-140s, says he mostly checks readings after eating a meal Denies hypoglycemia, tremor, diaphoresis.  Lab Results  Component Value Date   HGBA1C 7.7 (H) 07/24/2017    Patient Active Problem List   Diagnosis Date Noted  . Malignant neoplasm of urinary bladder ([place]) 09/04/2017  . Diabetes mellitus ([place]) 08/08/2017  . Healthcare maintenance 01/21/2017  . Benign fasciculation-cramp syndrome 08/22/2015  . Chronic thoracic back pain 03/20/2015  . Fasciculation of lower extremity 11/18/2014  . Bilateral thoracic back pain 09/28/2014  . Nonallopathic lesion-rib cage 07/14/2014  . Nonallopathic lesion of thoracic region 07/14/2014  . Nonallopathic lesion of cervical region 07/14/2014  . Posture imbalance 04/13/2014  . Chronic kidney disease, stage 3 ([place]) 06/01/2013  . Angina pectoris ([place]) 09/18/2011  . Dyspnea 06/13/2011  . NSTEMI (non-ST elevated myocardial infarction) ([place]) 02/27/2011  . CAD (coronary artery disease) 02/06/2011   . HTN (hypertension) 02/06/2011  . Hyperlipidemia 02/06/2011    Past Surgical History:  Procedure Laterality Date  . CARDIAC CATHETERIZATION  07/28/2009   EF 60%; Grafts patent except for SVG to the AM and PD. Native RCA is occluded.  [name] CARDIAC CATHETERIZATION  03/20/2006   EF 60-65%  . CARDIOVASCULAR STRESS TEST  06/12/2009   EF 71%, SMALL AREA OF INFARCT/ISCHEMIA IN INFERIOR WALL; FELT TO NOT BE CHANGED.  [name] CORONARY ARTERY BYPASS GRAFT  03/2006   LIMA GRAFT TO LAD, SAPHENOUS VEIN GRAFT TO THE ACUTE MARGINAL BRANCH THE RIGHT CORONARY, SAPHENOUS VEIN GRAFT TO THE PDA, SEQUENTIAL VEIN GRAFT TO THE FIRST AND SECOND OBTUSE MARGINAL VESSELS  . LEFT HEART CATHETERIZATION WITH CORONARY ANGIOGRAM N/A 02/26/2011   Procedure: LEFT HEART CATHETERIZATION WITH CORONARY ANGIOGRAM;  Surgeon: Thayer Headings, MD;  Location: [facility] CATH LAB;  Service: Cardiovascular;  Laterality: N/A;    Family History  Problem Relation Age of Onset  . Hyperlipidemia Mother        Living, 62  . Hypertension Mother   . Hyperlipidemia Father        Living 15  . Hypertension Father   . Healthy Sister   . Healthy Brother     Social History   Socioeconomic History  . Marital status: Married    Spouse name: Not on file  . Number of children: Not on file  . Years of education: Not on file  . Highest education level: Not on file  Occupational History  . Not on file  Social Needs  .  Financial resource strain: Not on file  . Food insecurity:    Worry: Not on file    Inability: Not on file  . Transportation needs:    Medical: Not on file    Non-medical: Not on file  Tobacco Use  . Smoking status: Never Smoker  . Smokeless tobacco: Never Used  Substance and Sexual Activity  . Alcohol use: No    Alcohol/week: 0.0 standard drinks  . Drug use: No  . Sexual activity: Yes  Lifestyle  . Physical activity:    Days per week: Not on file    Minutes per session: Not on file  . Stress: Not on file  Relationships  .  Social connections:    Talks on phone: Not on file    Gets together: Not on file    Attends religious service: Not on file    Active member of club or organization: Not on file    Attends meetings of clubs or organizations: Not on file    Relationship status: Not on file  . Intimate partner violence:    Fear of current or ex partner: Not on file    Emotionally abused: Not on file    Physically abused: Not on file    Forced sexual activity: Not on file  Other Topics Concern  . Not on file  Social History Narrative   Lives with wife and 2 children in a 2 story home.     Education: college.     Retired Physiological scientist.      Current Outpatient Medications:  .  allopurinol (ZYLOPRIM) 100 MG tablet, Take 1 tablet (100 mg total) by mouth daily., Disp: 30 tablet, Rfl: 6 .  amLODipine (NORVASC) 5 MG tablet, Take 1 tablet (5 mg total) by mouth daily., Disp: 30 tablet, Rfl: 6 .  amoxicillin (AMOXIL) 250 MG capsule, Take 250 mg by mouth daily., Disp: , Rfl: 11 .  aspirin 81 MG tablet, Take 1 tablet (81 mg total) by mouth daily., Disp: , Rfl:  .  baclofen (LIORESAL) 10 MG tablet, Take 2 tablets (20 mg total) by mouth 3 (three) times daily., Disp: 180 tablet, Rfl: 11 .  carbamazepine (TEGRETOL) 200 MG tablet, TAKE 1 TABLET BY MOUTH TWICE DAILY, Disp: 60 tablet, Rfl: 5 .  fenofibrate 160 MG tablet, TAKE 1 TABLET BY MOUTH ONCE DAILY, Disp: 90 tablet, Rfl: 1 .  fish oil-omega-3 fatty acids 1000 MG capsule, Take 2 capsules (2 g total) by mouth 2 (two) times daily., Disp: , Rfl:  .  glucose blood (ACCU-CHEK GUIDE) test strip, Use to check blood sugars 2 times daily. Dx Code: E11.9, Disp: 100 each, Rfl: 12 .  isosorbide mononitrate (IMDUR) 60 MG 24 hr tablet, TAKE 1 & 1/2 (ONE & ONE-HALF) TABLETS BY MOUTH ONCE DAILY, Disp: 135 tablet, Rfl: 2 .  levothyroxine (SYNTHROID, LEVOTHROID) 25 MCG tablet, Take 1 tablet (25 mcg total) by mouth daily., Disp: 30 tablet, Rfl: 1 .  lidocaine-prilocaine (EMLA)  cream, Apply 1 application topically as needed., Disp: 30 g, Rfl: 0 .  losartan (COZAAR) 100 MG tablet, TAKE 1 TABLET BY MOUTH ONCE DAILY, Disp: 90 tablet, Rfl: 0 .  meloxicam (MOBIC) 15 MG tablet, Take 1 tablet (15 mg total) by mouth daily., Disp: 30 tablet, Rfl: 1 .  methenamine (MANDELAMINE) 1 g tablet, Take 1,000 mg by mouth 3 (three) times daily. , Disp: , Rfl:  .  metoprolol tartrate (LOPRESSOR) 50 MG tablet, Take 1 tablet (50 mg total) by mouth  2 (two) times daily., Disp: 180 tablet, Rfl: 1 .  nitroGLYCERIN (NITROSTAT) 0.4 MG SL tablet, Place 1 tablet (0.4 mg total) under the tongue every 5 (five) minutes as needed. For chest pain, Disp: 25 tablet, Rfl: 3 .  prochlorperazine (COMPAZINE) 10 MG tablet, Take 1 tablet (10 mg total) by mouth every 6 (six) hours as needed for nausea or vomiting., Disp: 30 tablet, Rfl: 0 .  rOPINIRole (REQUIP) 0.25 MG tablet, TAKE 1 TABLET BY MOUTH 3 HOURS BEFORE BEDTIME, Disp: 30 tablet, Rfl: 5 .  rosuvastatin (CRESTOR) 40 MG tablet, Take 1 tablet (40 mg total) by mouth daily., Disp: 30 tablet, Rfl: 6 .  sitaGLIPtin (JANUVIA) 50 MG tablet, Take 1 tablet (50 mg total) by mouth daily., Disp: 30 tablet, Rfl: 1 .  traZODone (DESYREL) 50 MG tablet, Take 0.5-1 tablets (25-50 mg total) by mouth at bedtime as needed for sleep., Disp: 30 tablet, Rfl: 0  No Known Allergies   ROS See HPI  Objective  Vitals:   09/12/17 0923  BP: 114/78  Pulse: 72  SpO2: 95%  Weight: 155 lb (70.3 kg)  Height: 5\' 10"  (1.778 m)    Body mass index is 22.24 kg/m.  Physical Exam Vital signs reviewed. Constitutional: Patient appears well-developed and well-nourished. No distress.  HENT: Head: Normocephalic and atraumatic. Nose: Nose normal. Mouth/Throat: Oropharynx is clear and moist. No oropharyngeal exudate.  Eyes: Conjunctivae and EOM are normal. Pupils are equal, round, and reactive to light. No scleral icterus.  Neck: Normal range of motion. Neck supple.  Cardiovascular:  Normal rate, regular rhythm and Distal pulses intact. Pulmonary/Chest: Effort normal,  No respiratory distress. Neurological: He is alert and oriented to person, place, and time. No cranial nerve deficit. Coordination, balance, strength, speech and gait are normal.  Skin: Skin is warm and dry. No rash noted. No erythema.  Psychiatric: Patient has a normal mood and affect. behavior is normal. Judgment and thought content normal.   Assessment & Plan RTC in 2 months for F/U: DM- recheck A1c  Insomnia, unspecified type Discussed trial of trazodone to see if this helps his insomnia- dosing and side effects including drowsiness were discussed F/U for new, worsening symptoms or if no relief with trazodone - traZODone (DESYREL) 50 MG tablet; Take 0.5-1 tablets (25-50 mg total) by mouth at bedtime as needed for sleep.  Dispense: 30 tablet; Refill: 0

## 2017-09-15 ENCOUNTER — Other Ambulatory Visit: Payer: Self-pay | Admitting: *Deleted

## 2017-09-15 DIAGNOSIS — E1129 Type 2 diabetes mellitus with other diabetic kidney complication: Secondary | ICD-10-CM

## 2017-09-15 MED ORDER — SITAGLIPTIN PHOSPHATE 50 MG PO TABS: 50.0000 mg | ORAL_TABLET | Freq: Every day | ORAL | 0 refills | Status: DC

## 2017-09-16 ENCOUNTER — Inpatient Hospital Stay: Payer: Medicare Other

## 2017-09-16 ENCOUNTER — Other Ambulatory Visit: Payer: Self-pay | Admitting: Radiology

## 2017-09-16 VITALS — BP 106/67 | HR 57 | Temp 98.6°F | Resp 16

## 2017-09-16 DIAGNOSIS — Z79899 Other long term (current) drug therapy: Secondary | ICD-10-CM | POA: Diagnosis not present

## 2017-09-16 DIAGNOSIS — N189 Chronic kidney disease, unspecified: Secondary | ICD-10-CM | POA: Diagnosis not present

## 2017-09-16 DIAGNOSIS — C775 Secondary and unspecified malignant neoplasm of intrapelvic lymph nodes: Secondary | ICD-10-CM | POA: Diagnosis not present

## 2017-09-16 DIAGNOSIS — C679 Malignant neoplasm of bladder, unspecified: Secondary | ICD-10-CM

## 2017-09-16 DIAGNOSIS — Z5111 Encounter for antineoplastic chemotherapy: Secondary | ICD-10-CM | POA: Diagnosis not present

## 2017-09-16 DIAGNOSIS — D649 Anemia, unspecified: Secondary | ICD-10-CM | POA: Diagnosis not present

## 2017-09-16 DIAGNOSIS — Z7189 Other specified counseling: Secondary | ICD-10-CM | POA: Diagnosis not present

## 2017-09-16 LAB — CMP (CANCER CENTER ONLY)
ALT: 8 U/L (ref 0–44)
AST: 11 U/L — ABNORMAL LOW (ref 15–41)
Albumin: 3.1 g/dL — ABNORMAL LOW (ref 3.5–5.0)
Alkaline Phosphatase: 57 U/L (ref 38–126)
Anion gap: 10 (ref 5–15)
BUN: 30 mg/dL — ABNORMAL HIGH (ref 6–20)
CHLORIDE: 104 mmol/L (ref 98–111)
CO2: 25 mmol/L (ref 22–32)
CREATININE: 2.09 mg/dL — AB (ref 0.61–1.24)
Calcium: 9.3 mg/dL (ref 8.9–10.3)
GFR, EST AFRICAN AMERICAN: 38 mL/min — AB (ref >60)
GFR, EST NON AFRICAN AMERICAN: 33 mL/min — AB (ref >60)
Glucose, Bld: 138 mg/dL — ABNORMAL HIGH (ref 70–99)
Potassium: 4.1 mmol/L (ref 3.5–5.1)
Sodium: 139 mmol/L (ref 135–145)
TOTAL PROTEIN: 7.3 g/dL (ref 6.5–8.1)
Total Bilirubin: 0.3 mg/dL (ref 0.3–1.2)

## 2017-09-16 LAB — CBC WITH DIFFERENTIAL (CANCER CENTER ONLY)
BASOS ABS: 0 10*3/uL (ref 0.0–0.1)
Basophils Relative: 1 %
EOS ABS: 0.4 10*3/uL (ref 0.0–0.5)
EOS PCT: 5 %
HCT: 28.3 % — ABNORMAL LOW (ref 38.4–49.9)
HEMOGLOBIN: 9.1 g/dL — AB (ref 13.0–17.1)
Lymphocytes Relative: 15 %
Lymphs Abs: 1 10*3/uL (ref 0.9–3.3)
MCH: 28.3 pg (ref 27.2–33.4)
MCHC: 32.2 g/dL (ref 32.0–36.0)
MCV: 87.9 fL (ref 79.3–98.0)
Monocytes Absolute: 0.6 10*3/uL (ref 0.1–0.9)
Monocytes Relative: 9 %
NEUTROS PCT: 70 %
Neutro Abs: 4.7 10*3/uL (ref 1.5–6.5)
PLATELETS: 299 10*3/uL (ref 140–400)
RBC: 3.22 MIL/uL — AB (ref 4.20–5.82)
RDW: 13 % (ref 11.0–14.6)
WBC: 6.6 10*3/uL (ref 4.0–10.3)

## 2017-09-16 MED ORDER — SODIUM CHLORIDE 0.9 % IV SOLN
Freq: Once | INTRAVENOUS | Status: AC
  Administered 2017-09-16: 11:00:00 via INTRAVENOUS
  Filled 2017-09-16: qty 5

## 2017-09-16 MED ORDER — SODIUM CHLORIDE 0.9 % IV SOLN
1000.0000 mg/m2 | Freq: Once | INTRAVENOUS | Status: AC
  Administered 2017-09-16: 1900 mg via INTRAVENOUS
  Filled 2017-09-16: qty 49.97

## 2017-09-16 MED ORDER — SODIUM CHLORIDE 0.9 % IV SOLN
Freq: Once | INTRAVENOUS | Status: AC
  Administered 2017-09-16: 10:00:00 via INTRAVENOUS
  Filled 2017-09-16: qty 250

## 2017-09-16 MED ORDER — SODIUM CHLORIDE 0.9 % IV SOLN
275.0000 mg | Freq: Once | INTRAVENOUS | Status: AC
  Administered 2017-09-16: 280 mg via INTRAVENOUS
  Filled 2017-09-16: qty 28

## 2017-09-16 MED ORDER — PALONOSETRON HCL INJECTION 0.25 MG/5ML
0.2500 mg | Freq: Once | INTRAVENOUS | Status: AC
  Administered 2017-09-16: 0.25 mg via INTRAVENOUS

## 2017-09-16 MED ORDER — PALONOSETRON HCL INJECTION 0.25 MG/5ML
INTRAVENOUS | Status: AC
  Filled 2017-09-16: qty 5

## 2017-09-16 NOTE — Progress Notes (Signed)
Per Dr. Alen Blew ok to tx with ctn 2.09.

## 2017-09-16 NOTE — Patient Instructions (Signed)
[facility] Discharge Instructions for Patients Receiving Chemotherapy  Today you received the following chemotherapy agents gemcitabine (Gemzar) and carboplatin (Paraplatin)  To help prevent nausea and vomiting after your treatment, we encourage you to take your nausea medication as directed by your doctor.   If you develop nausea and vomiting that is not controlled by your nausea medication, call the clinic.   BELOW ARE SYMPTOMS THAT SHOULD BE REPORTED IMMEDIATELY:  *FEVER GREATER THAN 100.5 F  *CHILLS WITH OR WITHOUT FEVER  NAUSEA AND VOMITING THAT IS NOT CONTROLLED WITH YOUR NAUSEA MEDICATION  *UNUSUAL SHORTNESS OF BREATH  *UNUSUAL BRUISING OR BLEEDING  TENDERNESS IN MOUTH AND THROAT WITH OR WITHOUT PRESENCE OF ULCERS  *URINARY PROBLEMS  *BOWEL PROBLEMS  UNUSUAL RASH Items with * indicate a potential emergency and should be followed up as soon as possible.  Feel free to call the clinic should you have any questions or concerns. The clinic phone number is [phone].  Please show the CHEMO ALERT CARD at check-in to the Emergency Department and triage nurse.  Gemcitabine injection What is this medicine? GEMCITABINE (jem SIT a been) is a chemotherapy drug. This medicine is used to treat many types of cancer like breast cancer, lung cancer, pancreatic cancer, and ovarian cancer. This medicine may be used for other purposes; ask your health care provider or pharmacist if you have questions. COMMON BRAND NAME(S): Gemzar What should I tell my health care provider before I take this medicine? They need to know if you have any of these conditions: -blood disorders -infection -kidney disease -liver disease -recent or ongoing radiation therapy -an unusual or allergic reaction to gemcitabine, other chemotherapy, other medicines, foods, dyes, or preservatives -pregnant or trying to get pregnant -breast-feeding How should I use this medicine? This drug is  given as an infusion into a vein. It is administered in a hospital or clinic by a specially trained health care professional. Talk to your pediatrician regarding the use of this medicine in children. Special care may be needed. Overdosage: If you think you have taken too much of this medicine contact a poison control center or emergency room at once. NOTE: This medicine is only for you. Do not share this medicine with others. What if I miss a dose? It is important not to miss your dose. Call your doctor or health care professional if you are unable to keep an appointment. What may interact with this medicine? -medicines to increase blood counts like filgrastim, pegfilgrastim, sargramostim -some other chemotherapy drugs like cisplatin -vaccines Talk to your doctor or health care professional before taking any of these medicines: -acetaminophen -aspirin -ibuprofen -ketoprofen -naproxen This list may not describe all possible interactions. Give your health care provider a list of all the medicines, herbs, non-prescription drugs, or dietary supplements you use. Also tell them if you smoke, drink alcohol, or use illegal drugs. Some items may interact with your medicine. What should I watch for while using this medicine? Visit your doctor for checks on your progress. This drug may make you feel generally unwell. This is not uncommon, as chemotherapy can affect healthy cells as well as cancer cells. Report any side effects. Continue your course of treatment even though you feel ill unless your doctor tells you to stop. In some cases, you may be given additional medicines to help with side effects. Follow all directions for their use. Call your doctor or health care professional for advice if you get a fever, chills or sore throat,   or other symptoms of a cold or flu. Do not treat yourself. This drug decreases your body's ability to fight infections. Try to avoid being around people who are sick. This  medicine may increase your risk to bruise or bleed. Call your doctor or health care professional if you notice any unusual bleeding. Be careful brushing and flossing your teeth or using a toothpick because you may get an infection or bleed more easily. If you have any dental work done, tell your dentist you are receiving this medicine. Avoid taking products that contain aspirin, acetaminophen, ibuprofen, naproxen, or ketoprofen unless instructed by your doctor. These medicines may hide a fever. Women should inform their doctor if they wish to become pregnant or think they might be pregnant. There is a potential for serious side effects to an unborn child. Talk to your health care professional or pharmacist for more information. Do not breast-feed an infant while taking this medicine. What side effects may I notice from receiving this medicine? Side effects that you should report to your doctor or health care professional as soon as possible: -allergic reactions like skin rash, itching or hives, swelling of the face, lips, or tongue -low blood counts - this medicine may decrease the number of white blood cells, red blood cells and platelets. You may be at increased risk for infections and bleeding. -signs of infection - fever or chills, cough, sore throat, pain or difficulty passing urine -signs of decreased platelets or bleeding - bruising, pinpoint red spots on the skin, black, tarry stools, blood in the urine -signs of decreased red blood cells - unusually weak or tired, fainting spells, lightheadedness -breathing problems -chest pain -mouth sores -nausea and vomiting -pain, swelling, redness at site where injected -pain, tingling, numbness in the hands or feet -stomach pain -swelling of ankles, feet, hands -unusual bleeding Side effects that usually do not require medical attention (report to your doctor or health care professional if they continue or are  bothersome): -constipation -diarrhea -hair loss -loss of appetite -stomach upset This list may not describe all possible side effects. Call your doctor for medical advice about side effects. You may report side effects to FDA at 1-800-FDA-1088. Where should I keep my medicine? This drug is given in a hospital or clinic and will not be stored at home. NOTE: This sheet is a summary. It may not cover all possible information. If you have questions about this medicine, talk to your doctor, pharmacist, or health care provider.  2018 Elsevier/Gold Standard (2007-05-05 18:45:54)  Carboplatin injection What is this medicine? CARBOPLATIN (KAR boe pla tin) is a chemotherapy drug. It targets fast dividing cells, like cancer cells, and causes these cells to die. This medicine is used to treat ovarian cancer and many other cancers. This medicine may be used for other purposes; ask your health care provider or pharmacist if you have questions. COMMON BRAND NAME(S): Paraplatin What should I tell my health care provider before I take this medicine? They need to know if you have any of these conditions: -blood disorders -hearing problems -kidney disease -recent or ongoing radiation therapy -an unusual or allergic reaction to carboplatin, cisplatin, other chemotherapy, other medicines, foods, dyes, or preservatives -pregnant or trying to get pregnant -breast-feeding How should I use this medicine? This drug is usually given as an infusion into a vein. It is administered in a hospital or clinic by a specially trained health care professional. Talk to your pediatrician regarding the use of this medicine in children. Special   care may be needed. Overdosage: If you think you have taken too much of this medicine contact a poison control center or emergency room at once. NOTE: This medicine is only for you. Do not share this medicine with others. What if I miss a dose? It is important not to miss a dose.  Call your doctor or health care professional if you are unable to keep an appointment. What may interact with this medicine? -medicines for seizures -medicines to increase blood counts like filgrastim, pegfilgrastim, sargramostim -some antibiotics like amikacin, gentamicin, neomycin, streptomycin, tobramycin -vaccines Talk to your doctor or health care professional before taking any of these medicines: -acetaminophen -aspirin -ibuprofen -ketoprofen -naproxen This list may not describe all possible interactions. Give your health care provider a list of all the medicines, herbs, non-prescription drugs, or dietary supplements you use. Also tell them if you smoke, drink alcohol, or use illegal drugs. Some items may interact with your medicine. What should I watch for while using this medicine? Your condition will be monitored carefully while you are receiving this medicine. You will need important blood work done while you are taking this medicine. This drug may make you feel generally unwell. This is not uncommon, as chemotherapy can affect healthy cells as well as cancer cells. Report any side effects. Continue your course of treatment even though you feel ill unless your doctor tells you to stop. In some cases, you may be given additional medicines to help with side effects. Follow all directions for their use. Call your doctor or health care professional for advice if you get a fever, chills or sore throat, or other symptoms of a cold or flu. Do not treat yourself. This drug decreases your body's ability to fight infections. Try to avoid being around people who are sick. This medicine may increase your risk to bruise or bleed. Call your doctor or health care professional if you notice any unusual bleeding. Be careful brushing and flossing your teeth or using a toothpick because you may get an infection or bleed more easily. If you have any dental work done, tell your dentist you are receiving this  medicine. Avoid taking products that contain aspirin, acetaminophen, ibuprofen, naproxen, or ketoprofen unless instructed by your doctor. These medicines may hide a fever. Do not become pregnant while taking this medicine. Women should inform their doctor if they wish to become pregnant or think they might be pregnant. There is a potential for serious side effects to an unborn child. Talk to your health care professional or pharmacist for more information. Do not breast-feed an infant while taking this medicine. What side effects may I notice from receiving this medicine? Side effects that you should report to your doctor or health care professional as soon as possible: -allergic reactions like skin rash, itching or hives, swelling of the face, lips, or tongue -signs of infection - fever or chills, cough, sore throat, pain or difficulty passing urine -signs of decreased platelets or bleeding - bruising, pinpoint red spots on the skin, black, tarry stools, nosebleeds -signs of decreased red blood cells - unusually weak or tired, fainting spells, lightheadedness -breathing problems -changes in hearing -changes in vision -chest pain -high blood pressure -low blood counts - This drug may decrease the number of white blood cells, red blood cells and platelets. You may be at increased risk for infections and bleeding. -nausea and vomiting -pain, swelling, redness or irritation at the injection site -pain, tingling, numbness in the hands or feet -problems with   balance, talking, walking -trouble passing urine or change in the amount of urine Side effects that usually do not require medical attention (report to your doctor or health care professional if they continue or are bothersome): -hair loss -loss of appetite -metallic taste in the mouth or changes in taste This list may not describe all possible side effects. Call your doctor for medical advice about side effects. You may report side effects to  FDA at 1-800-FDA-1088. Where should I keep my medicine? This drug is given in a hospital or clinic and will not be stored at home. NOTE: This sheet is a summary. It may not cover all possible information. If you have questions about this medicine, talk to your doctor, pharmacist, or health care provider.  2018 Elsevier/Gold Standard (2007-03-31 14:38:05)    

## 2017-09-16 NOTE — Progress Notes (Signed)
Since pt was not able to get port placed before this flush appt and will receive his tx today regardless of his access the pt will be given an IV and labs will be drawn from there.

## 2017-09-17 ENCOUNTER — Telehealth: Payer: Self-pay | Admitting: *Deleted

## 2017-09-17 ENCOUNTER — Other Ambulatory Visit: Payer: Self-pay | Admitting: Radiology

## 2017-09-17 DIAGNOSIS — N50819 Testicular pain, unspecified: Secondary | ICD-10-CM | POA: Diagnosis not present

## 2017-09-17 DIAGNOSIS — R109 Unspecified abdominal pain: Secondary | ICD-10-CM | POA: Diagnosis not present

## 2017-09-17 DIAGNOSIS — G8929 Other chronic pain: Secondary | ICD-10-CM | POA: Diagnosis not present

## 2017-09-17 DIAGNOSIS — G894 Chronic pain syndrome: Secondary | ICD-10-CM | POA: Diagnosis not present

## 2017-09-17 DIAGNOSIS — M898X1 Other specified disorders of bone, shoulder: Secondary | ICD-10-CM | POA: Diagnosis not present

## 2017-09-17 NOTE — Telephone Encounter (Signed)
Spoke with patient. Had carbo/gemzar yesterday. States he is eating and drinking well. Denies n/v, diarrhea or fever. Knows to call this office for any problems.

## 2017-09-19 ENCOUNTER — Other Ambulatory Visit: Payer: Self-pay

## 2017-09-19 ENCOUNTER — Ambulatory Visit (HOSPITAL_COMMUNITY)
Admission: RE | Admit: 2017-09-19 | Discharge: 2017-09-19 | Disposition: A | Discharge status: Home / Self Care | Payer: Medicare Other | Source: Ambulatory Visit | Attending: Nurse Practitioner | Admitting: Nurse Practitioner

## 2017-09-19 ENCOUNTER — Encounter (HOSPITAL_COMMUNITY): Payer: Self-pay

## 2017-09-19 ENCOUNTER — Ambulatory Visit (HOSPITAL_COMMUNITY)
Admission: RE | Admit: 2017-09-19 | Discharge: 2017-09-19 | Disposition: A | Discharge status: Home / Self Care | Payer: Medicare Other | Source: Ambulatory Visit | Attending: Oncology | Admitting: Oncology

## 2017-09-19 DIAGNOSIS — I129 Hypertensive chronic kidney disease with stage 1 through stage 4 chronic kidney disease, or unspecified chronic kidney disease: Secondary | ICD-10-CM | POA: Insufficient documentation

## 2017-09-19 DIAGNOSIS — Z951 Presence of aortocoronary bypass graft: Secondary | ICD-10-CM | POA: Diagnosis not present

## 2017-09-19 DIAGNOSIS — N182 Chronic kidney disease, stage 2 (mild): Secondary | ICD-10-CM | POA: Insufficient documentation

## 2017-09-19 DIAGNOSIS — Z452 Encounter for adjustment and management of vascular access device: Secondary | ICD-10-CM | POA: Diagnosis not present

## 2017-09-19 DIAGNOSIS — E785 Hyperlipidemia, unspecified: Secondary | ICD-10-CM | POA: Diagnosis not present

## 2017-09-19 DIAGNOSIS — Z5111 Encounter for antineoplastic chemotherapy: Secondary | ICD-10-CM | POA: Diagnosis not present

## 2017-09-19 DIAGNOSIS — I252 Old myocardial infarction: Secondary | ICD-10-CM | POA: Insufficient documentation

## 2017-09-19 DIAGNOSIS — Z7982 Long term (current) use of aspirin: Secondary | ICD-10-CM | POA: Diagnosis not present

## 2017-09-19 DIAGNOSIS — C679 Malignant neoplasm of bladder, unspecified: Secondary | ICD-10-CM | POA: Diagnosis not present

## 2017-09-19 DIAGNOSIS — I2510 Atherosclerotic heart disease of native coronary artery without angina pectoris: Secondary | ICD-10-CM | POA: Diagnosis not present

## 2017-09-19 HISTORY — PX: IR IMAGING GUIDED PORT INSERTION: IMG5740

## 2017-09-19 LAB — BASIC METABOLIC PANEL
Anion gap: 9 (ref 5–15)
BUN: 39 mg/dL — AB (ref 6–20)
CO2: 26 mmol/L (ref 22–32)
CREATININE: 2.13 mg/dL — AB (ref 0.61–1.24)
Calcium: 8.9 mg/dL (ref 8.9–10.3)
Chloride: 104 mmol/L (ref 98–111)
GFR calc Af Amer: 37 mL/min — ABNORMAL LOW (ref >60)
GFR calc non Af Amer: 32 mL/min — ABNORMAL LOW (ref >60)
GLUCOSE: 117 mg/dL — AB (ref 70–99)
Potassium: 4.5 mmol/L (ref 3.5–5.1)
Sodium: 139 mmol/L (ref 135–145)

## 2017-09-19 LAB — CBC WITH DIFFERENTIAL/PLATELET
Basophils Absolute: 0 10*3/uL (ref 0.0–0.1)
Basophils Relative: 0 %
EOS ABS: 0.2 10*3/uL (ref 0.0–0.7)
EOS PCT: 3 %
HCT: 28.8 % — ABNORMAL LOW (ref 39.0–52.0)
Hemoglobin: 9.4 g/dL — ABNORMAL LOW (ref 13.0–17.0)
LYMPHS PCT: 13 %
Lymphs Abs: 0.9 10*3/uL (ref 0.7–4.0)
MCH: 28.6 pg (ref 26.0–34.0)
MCHC: 32.6 g/dL (ref 30.0–36.0)
MCV: 87.5 fL (ref 78.0–100.0)
MONO ABS: 0.1 10*3/uL (ref 0.1–1.0)
Monocytes Relative: 2 %
Neutro Abs: 5.6 10*3/uL (ref 1.7–7.7)
Neutrophils Relative %: 82 %
PLATELETS: 294 10*3/uL (ref 150–400)
RBC: 3.29 MIL/uL — ABNORMAL LOW (ref 4.22–5.81)
RDW: 13.2 % (ref 11.5–15.5)
WBC: 6.8 10*3/uL (ref 4.0–10.5)

## 2017-09-19 LAB — PROTIME-INR
INR: 1.2
PROTHROMBIN TIME: 15.1 s (ref 11.4–15.2)

## 2017-09-19 MED ORDER — SODIUM CHLORIDE 0.9 % IV SOLN
INTRAVENOUS | Status: DC
  Administered 2017-09-19: 14:00:00 via INTRAVENOUS

## 2017-09-19 MED ORDER — FENTANYL CITRATE (PF) 100 MCG/2ML IJ SOLN
INTRAMUSCULAR | Status: AC
  Filled 2017-09-19: qty 2

## 2017-09-19 MED ORDER — MIDAZOLAM HCL 2 MG/2ML IJ SOLN
INTRAMUSCULAR | Status: AC
  Filled 2017-09-19: qty 2

## 2017-09-19 MED ORDER — IOPAMIDOL (ISOVUE-300) INJECTION 61%
INTRAVENOUS | Status: AC
  Filled 2017-09-19: qty 50

## 2017-09-19 MED ORDER — HEPARIN SOD (PORK) LOCK FLUSH 100 UNIT/ML IV SOLN
INTRAVENOUS | Status: AC
  Filled 2017-09-19: qty 5

## 2017-09-19 MED ORDER — CEFAZOLIN SODIUM-DEXTROSE 2-4 GM/100ML-% IV SOLN
2.0000 g | INTRAVENOUS | Status: AC
  Administered 2017-09-19: 2 g via INTRAVENOUS

## 2017-09-19 MED ORDER — FENTANYL CITRATE (PF) 100 MCG/2ML IJ SOLN
INTRAMUSCULAR | Status: AC | PRN
  Administered 2017-09-19 (×2): 50 ug via INTRAVENOUS

## 2017-09-19 MED ORDER — MIDAZOLAM HCL 2 MG/2ML IJ SOLN
INTRAMUSCULAR | Status: AC | PRN
  Administered 2017-09-19 (×2): 1 mg via INTRAVENOUS

## 2017-09-19 MED ORDER — CEFAZOLIN SODIUM-DEXTROSE 2-4 GM/100ML-% IV SOLN
INTRAVENOUS | Status: AC
  Administered 2017-09-19: 2 g via INTRAVENOUS
  Filled 2017-09-19: qty 100

## 2017-09-19 NOTE — Procedures (Signed)
Pre Procedure Dx: Bladder Cancer Post Procedural Dx: Same  Successful placement of right IJ approach port-a-cath with tip at the superior caval atrial junction. The catheter is ready for immediate use.  Estimated Blood Loss: Minimal  Complications: None immediate.  Jay Kiah Keay, MD Pager #: [phone]   

## 2017-09-19 NOTE — H&P (Signed)
Chief Complaint: Patient was seen in consultation today for bladder cancer  Referring Physician(s): Wyatt Portela  Supervising Physician: Sandi Mariscal  Patient Status: [facility] - Out-pt  History of Present Illness: Andrew Joyce is a 57 y.o. male with past medical history of CKD, CAD s/p CABG in 2008, HTN, and bladder cancer who has plans for upcoming chemotherapy.  Port-A-Cath requested by Dr. Alen Blew.  Patient presents to radiology today in his usual state of health; no new symptoms or concerns.  He has been NPO as of 7AM.  He does not take blood thinners.   Past Medical History:  Diagnosis Date  . 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 Chronic kidney disease (CKD), stage II (mild)   . Congenital birth defect   . Coronary artery disease    a. s/p CABG in 2008; b.  s/p cath in 2011 for med rx.;  c.  NSTEMI (02/2011):  LHC (02/2011):  LAD occluded, distal LAD filled via left to left collaterals, distal CFX 40-50%, proximal RCA occluded (right to right collaterals), distal RCA occluded, S-RCA occluded proximally, S-OM1/OM2 occluded (new from 2011 - culprit), L-LAD ok, ant apical HK, EF 45-50% - med Rx.  [name] Dyslipidemia   . History of renal stone   . Hx of cardiovascular stress test    Nuclear (06/2009):  EF 71%, small area of infarct/ischemia in inferior wall-unchanged from prior  . Hx of echocardiogram    Echo (05/2013):  EF 55-60%, inf HK, mild AI, normal RVSF, RVSP 28 mmHg  . Hypertension   . Pancreatitis   . Recurrent UTI     Past Surgical History:  Procedure Laterality Date  . CARDIAC CATHETERIZATION  07/28/2009   EF 60%; Grafts patent except for SVG to the AM and PD. Native RCA is occluded.  [name] CARDIAC CATHETERIZATION  03/20/2006   EF 60-65%  . CARDIOVASCULAR STRESS TEST  06/12/2009   EF 71%, SMALL AREA OF INFARCT/ISCHEMIA IN INFERIOR WALL; FELT TO NOT BE CHANGED.  [name] CORONARY ARTERY BYPASS GRAFT  03/2006   LIMA GRAFT TO LAD, SAPHENOUS VEIN  GRAFT TO THE ACUTE MARGINAL BRANCH THE RIGHT CORONARY, SAPHENOUS VEIN GRAFT TO THE PDA, SEQUENTIAL VEIN GRAFT TO THE FIRST AND SECOND OBTUSE MARGINAL VESSELS  . KIDNEY SURGERY    . LEFT HEART CATHETERIZATION WITH CORONARY ANGIOGRAM N/A 02/26/2011   Procedure: LEFT HEART CATHETERIZATION WITH CORONARY ANGIOGRAM;  Surgeon: Thayer Headings, MD;  Location: [facility] CATH LAB;  Service: Cardiovascular;  Laterality: N/A;    Allergies: Patient has no known allergies.  Medications: Prior to Admission medications   Medication Sig Start Date End Date Taking? Authorizing Provider  allopurinol (ZYLOPRIM) 100 MG tablet Take 1 tablet (100 mg total) by mouth daily. 08/08/16   Lyndal Pulley, DO  amLODipine (NORVASC) 5 MG tablet Take 1 tablet (5 mg total) by mouth daily. 09/17/13   Martinique, Peter M, MD  amoxicillin (AMOXIL) 250 MG capsule Take 250 mg by mouth daily. 08/13/17   [provider]  aspirin 81 MG tablet Take 1 tablet (81 mg total) by mouth daily. 02/28/11   Dunn, Nedra Hai, PA-C  baclofen (LIORESAL) 10 MG tablet Take 2 tablets (20 mg total) by mouth 3 (three) times daily. 01/16/17   Patel, Arvin Collard K, DO  carbamazepine (TEGRETOL) 200 MG tablet TAKE 1 TABLET BY MOUTH TWICE DAILY 08/18/17   Patel, Donika K, DO  fenofibrate 160 MG tablet TAKE 1 TABLET BY MOUTH ONCE  DAILY 04/15/17   Martinique, Peter M, MD  fish oil-omega-3 fatty acids 1000 MG capsule Take 2 capsules (2 g total) by mouth 2 (two) times daily. 02/28/11   Dunn, Lisbeth Renshaw N, PA-C  glucose blood (ACCU-CHEK GUIDE) test strip Use to check blood sugars 2 times daily. Dx Code: E11.9 08/11/17   [name], NP  isosorbide mononitrate (IMDUR) 60 MG 24 hr tablet TAKE 1 & 1/2 (ONE & ONE-HALF) TABLETS BY MOUTH ONCE DAILY 06/11/17   Martinique, Peter M, MD  levothyroxine (SYNTHROID, LEVOTHROID) 25 MCG tablet Take 1 tablet (25 mcg total) by mouth daily. 08/08/16   Lyndal Pulley, DO  lidocaine-prilocaine (EMLA) cream Apply 1 application topically as needed. 09/04/17    Wyatt Portela, MD  losartan (COZAAR) 100 MG tablet TAKE 1 TABLET BY MOUTH ONCE DAILY 09/10/17   [place]ique, Peter M, MD  meloxicam (MOBIC) 15 MG tablet Take 1 tablet (15 mg total) by mouth daily. 10/29/16   Gardiner Barefoot, DPM  methenamine (MANDELAMINE) 1 g tablet Take 1,000 mg by mouth 3 (three) times daily.  12/21/15   [provider]  metoprolol tartrate (LOPRESSOR) 50 MG tablet Take 1 tablet (50 mg total) by mouth 2 (two) times daily. 09/09/17   [place], Peter M, MD  nitroGLYCERIN (NITROSTAT) 0.4 MG SL tablet Place 1 tablet (0.4 mg total) under the tongue every 5 (five) minutes as needed. For chest pain 06/20/16   [place], Peter M, MD  prochlorperazine (COMPAZINE) 10 MG tablet Take 1 tablet (10 mg total) by mouth every 6 (six) hours as needed for nausea or vomiting. 09/04/17   Wyatt Portela, MD  rOPINIRole (REQUIP) 0.25 MG tablet TAKE 1 TABLET BY MOUTH 3 HOURS BEFORE BEDTIME 05/01/17   Narda Amber K, DO  rosuvastatin (CRESTOR) 40 MG tablet Take 1 tablet (40 mg total) by mouth daily. 08/05/17 11/03/17  [place], Peter M, MD  sitaGLIPtin (JANUVIA) 50 MG tablet Take 1 tablet (50 mg total) by mouth daily. 09/15/17   [name], NP  traZODone (DESYREL) 50 MG tablet Take 0.5-1 tablets (25-50 mg total) by mouth at bedtime as needed for sleep. 09/12/17   [name], NP     Family History  Problem Relation Age of Onset  . Hyperlipidemia Mother        Living, 61  . Hypertension Mother   . Hyperlipidemia Father        Living 82  . Hypertension Father   . Healthy Sister   . Healthy Brother     Social History   Socioeconomic History  . Marital status: Married    Spouse name: Not on file  . Number of children: Not on file  . Years of education: Not on file  . Highest education level: Not on file  Occupational History  . Not on file  Social Needs  . Financial resource strain: Not on file  . Food insecurity:    Worry: Not on file    Inability: Not on file  . Transportation  needs:    Medical: Not on file    Non-medical: Not on file  Tobacco Use  . Smoking status: Never Smoker  . Smokeless tobacco: Never Used  Substance and Sexual Activity  . Alcohol use: No    Alcohol/week: 0.0 standard drinks  . Drug use: No  . Sexual activity: Yes  Lifestyle  . Physical activity:    Days per week: Not on file    Minutes per session: Not on file  .  Stress: Not on file  Relationships  . Social connections:    Talks on phone: Not on file    Gets together: Not on file    Attends religious service: Not on file    Active member of club or organization: Not on file    Attends meetings of clubs or organizations: Not on file    Relationship status: Not on file  Other Topics Concern  . Not on file  Social History Narrative   Lives with wife and 2 children in a 2 story home.     Education: college.     Retired Physiological scientist.      Review of Systems: A 12 point ROS discussed and pertinent positives are indicated in the HPI above.  All other systems are negative.  Review of Systems  Constitutional: Negative for fatigue and fever.  Respiratory: Negative for cough and shortness of breath.   Cardiovascular: Negative for chest pain.  Gastrointestinal: Negative for abdominal pain, diarrhea, nausea and vomiting.  Genitourinary: Negative for dysuria.  Musculoskeletal: Negative for back pain.  Psychiatric/Behavioral: Negative for behavioral problems and confusion.    Vital Signs: BP 114/65   Pulse 62   Temp 99.6 F (37.6 C) (Oral)   Resp 16   SpO2 100%   Physical Exam  Constitutional: He is oriented to person, place, and time. He appears well-developed. No distress.  Neck: Normal range of motion. Neck supple. No tracheal deviation present.  Cardiovascular: Normal rate, regular rhythm and normal heart sounds.  Pulmonary/Chest: Effort normal and breath sounds normal. No respiratory distress.  Lymphadenopathy:    He has no cervical adenopathy.  Neurological: He  is alert and oriented to person, place, and time.  Skin: Skin is warm and dry. He is not diaphoretic.  Psychiatric: He has a normal mood and affect. His behavior is normal. Judgment and thought content normal.  Nursing note and vitals reviewed.    MD Evaluation Airway: WNL Heart: WNL Abdomen: WNL Chest/ Lungs: WNL ASA  Classification: 3 Mallampati/Airway Score: One   Imaging: No results found.  Labs:  CBC: Recent Labs    01/30/17 0918 09/16/17 0748 09/19/17 1344  WBC 5.8 6.6 6.8  HGB 14.1 9.1* 9.4*  HCT 42.2 28.3* 28.8*  PLT 217 299 294    COAGS: Recent Labs    09/19/17 1344  INR 1.20    BMP: Recent Labs    01/30/17 0918 07/24/17 0942 09/12/17 1011 09/16/17 0748  NA 141 141 137 139  K 4.6 4.8 4.6 4.1  CL 101 101 101 104  CO2 24 24 28 25   GLUCOSE 159* 137* 164* 138*  BUN 29* 35* 35* 30*  CALCIUM 9.5 9.3 10.1 9.3  CREATININE 1.29* 2.39* 2.17* 2.09*  GFRNONAA 61 29*  --  33*  GFRAA 70 33*  --  38*    LIVER FUNCTION TESTS: Recent Labs    01/30/17 0918 07/24/17 0942 09/16/17 0748  BILITOT 0.3 <0.2 0.3  AST 17 15 11*  ALT 18 10 8   ALKPHOS 67 61 57  PROT 7.2 6.8 7.3  ALBUMIN 4.6 4.0 3.1*    TUMOR MARKERS: No results for input(s): AFPTM, CEA, CA199, CHROMGRNA in the last 8760 hours.  Assessment and Plan: Patient with past medical history of CAD, CKD presents with complaint of bladder cancer. He now has plans for upcoming chemotherapy.  IR consulted for Port-A-Cath placement at the request of Dr. Alen Blew. Case reviewed by Dr. Pascal Lux who approves patient for procedure.  Patient presents  today in their usual state of health.  He has been NPO and is not currently on blood thinners.   Risks and benefits of image guided port-a-catheter placement was discussed with the patient including, but not limited to bleeding, infection, pneumothorax, or fibrin sheath development and need for additional procedures.  All of the patient's questions were  answered, patient is agreeable to proceed. Consent signed and in chart.  Thank you for this interesting consult.  I greatly enjoyed meeting Andrew Joyce and look forward to participating in their care.  A copy of this report was sent to the requesting provider on this date.  Electronically Signed: Docia Barrier, PA 09/19/2017, 2:15 PM   I spent a total of  30 Minutes   in face to face in clinical consultation, greater than 50% of which was counseling/coordinating care for bladder cancer.

## 2017-09-19 NOTE — Discharge Instructions (Signed)
Moderate Conscious Sedation, Adult, Care After °These instructions provide you with information about caring for yourself after your procedure. Your health care provider may also give you more specific instructions. Your treatment has been planned according to current medical practices, but problems sometimes occur. Call your health care provider if you have any problems or questions after your procedure. °What can I expect after the procedure? °After your procedure, it is common: °· To feel sleepy for several hours. °· To feel clumsy and have poor balance for several hours. °· To have poor judgment for several hours. °· To vomit if you eat too soon. ° °Follow these instructions at home: °For at least 24 hours after the procedure: ° °· Do not: °? Participate in activities where you could fall or become injured. °? Drive. °? Use heavy machinery. °? Drink alcohol. °? Take sleeping pills or medicines that cause drowsiness. °? Make important decisions or sign legal documents. °? Take care of children on your own. °· Rest. °Eating and drinking °· Follow the diet recommended by your health care provider. °· If you vomit: °? Drink water, juice, or soup when you can drink without vomiting. °? Make sure you have little or no nausea before eating solid foods. °General instructions °· Have a responsible adult stay with you until you are awake and alert. °· Take over-the-counter and prescription medicines only as told by your health care provider. °· If you smoke, do not smoke without supervision. °· Keep all follow-up visits as told by your health care provider. This is important. °Contact a health care provider if: °· You keep feeling nauseous or you keep vomiting. °· You feel light-headed. °· You develop a rash. °· You have a fever. °Get help right away if: °· You have trouble breathing. °This information is not intended to replace advice given to you by your health care provider. Make sure you discuss any questions you have  with your health care provider. °Document Released: 10/14/2012 Document Revised: 05/29/2015 Document Reviewed: 04/15/2015 °Elsevier Interactive Patient Education © 2018 [employer] Inc[employer] ° ° °Implanted Port Insertion, Care After °This sheet gives you information about how to care for yourself after your procedure. Your health care provider may also give you more specific instructions. If you have problems or questions, contact your health care provider. °What can I expect after the procedure? °After your procedure, it is common to have: °· Discomfort at the port insertion site. °· Bruising on the skin over the port. This should improve over 3-4 days. ° °Follow these instructions at home: °Port care °· After your port is placed, you will get a manufacturer's information card. The card has information about your port. Keep this card with you at all times. °· Take care of the port as told by your health care provider. Ask your health care provider if you or a family member can get training for taking care of the port at home. A home health care nurse may also take care of the port. °· Make sure to remember what type of port you have. °Incision care °· Follow instructions from your health care provider about how to take care of your port insertion site. Make sure you: °? Wash your hands with soap and water before you change your bandage (dressing). If soap and water are not available, use hand sanitizer. °? Change your dressing as told by your health care provider.  You may remove your dressing tomorrow. °? Leave stitches (sutures), skin glue, or adhesive strips   in place. These skin closures may need to stay in place for 2 weeks or longer. If adhesive strip edges start to loosen and curl up, you may trim the loose edges. Do not remove adhesive strips completely unless your health care provider tells you to do that.  DO NOT use EMLA cream for 2 weeks after port placement as this cream will remove surgical glue on your  incision. °· Check your port insertion site every day for signs of infection. Check for: °? More redness, swelling, or pain. °? More fluid or blood. °? Warmth. °? Pus or a bad smell. °General instructions °· Do not take baths, swim, or use a hot tub until your health care provider approves.  You may shower tomorrow. °· Do not lift anything that is heavier than 10 lb (4.5 kg) for a week, or as told by your health care provider. °· Ask your health care provider when it is okay to: °? Return to work or school. °? Resume usual physical activities or sports. °· Do not drive for 24 hours if you were given a medicine to help you relax (sedative). °· Take over-the-counter and prescription medicines only as told by your health care provider. °· Wear a medical alert bracelet in case of an emergency. This will tell any health care providers that you have a port. °· Keep all follow-up visits as told by your health care provider. This is important. °Contact a health care provider if: °· You cannot flush your port with saline as directed, or you cannot draw blood from the port. °· You have a fever or chills. °· You have more redness, swelling, or pain around your port insertion site. °· You have more fluid or blood coming from your port insertion site. °· Your port insertion site feels warm to the touch. °· You have pus or a bad smell coming from the port insertion site. °Get help right away if: °· You have chest pain or shortness of breath. °· You have bleeding from your port that you cannot control. °Summary °· Take care of the port as told by your health care provider. °· Change your dressing as told by your health care provider. °· Keep all follow-up visits as told by your health care provider. °This information is not intended to replace advice given to you by your health care provider. Make sure you discuss any questions you have with your health care provider. °Document Released: 10/14/2012 Document Revised: 11/15/2015  Document Reviewed: 11/15/2015 °Elsevier Interactive Patient Education © 2017 Elsevier Inc. ° ° °

## 2017-09-22 ENCOUNTER — Other Ambulatory Visit: Payer: Self-pay | Admitting: Nurse Practitioner

## 2017-09-22 DIAGNOSIS — N183 Chronic kidney disease, stage 3 unspecified: Secondary | ICD-10-CM

## 2017-09-23 ENCOUNTER — Telehealth: Payer: Self-pay

## 2017-09-23 ENCOUNTER — Inpatient Hospital Stay: Payer: Medicare Other

## 2017-09-23 ENCOUNTER — Inpatient Hospital Stay (HOSPITAL_BASED_OUTPATIENT_CLINIC_OR_DEPARTMENT_OTHER): Payer: Medicare Other | Admitting: Oncology

## 2017-09-23 VITALS — BP 102/54 | HR 56 | Temp 98.1°F | Resp 20 | Ht 70.0 in | Wt 155.3 lb

## 2017-09-23 DIAGNOSIS — D649 Anemia, unspecified: Secondary | ICD-10-CM | POA: Diagnosis not present

## 2017-09-23 DIAGNOSIS — Z7189 Other specified counseling: Secondary | ICD-10-CM

## 2017-09-23 DIAGNOSIS — C679 Malignant neoplasm of bladder, unspecified: Secondary | ICD-10-CM

## 2017-09-23 DIAGNOSIS — C775 Secondary and unspecified malignant neoplasm of intrapelvic lymph nodes: Secondary | ICD-10-CM | POA: Diagnosis not present

## 2017-09-23 DIAGNOSIS — Z5111 Encounter for antineoplastic chemotherapy: Secondary | ICD-10-CM | POA: Diagnosis not present

## 2017-09-23 DIAGNOSIS — N189 Chronic kidney disease, unspecified: Secondary | ICD-10-CM | POA: Diagnosis not present

## 2017-09-23 DIAGNOSIS — Z95828 Presence of other vascular implants and grafts: Secondary | ICD-10-CM

## 2017-09-23 LAB — CMP (CANCER CENTER ONLY)
ALK PHOS: 50 U/L (ref 38–126)
ALT: 14 U/L (ref 0–44)
AST: 17 U/L (ref 15–41)
Albumin: 3 g/dL — ABNORMAL LOW (ref 3.5–5.0)
Anion gap: 9 (ref 5–15)
BUN: 32 mg/dL — AB (ref 6–20)
CALCIUM: 9.5 mg/dL (ref 8.9–10.3)
CO2: 29 mmol/L (ref 22–32)
CREATININE: 2.15 mg/dL — AB (ref 0.61–1.24)
Chloride: 103 mmol/L (ref 98–111)
GFR, EST AFRICAN AMERICAN: 37 mL/min — AB (ref >60)
GFR, Estimated: 32 mL/min — ABNORMAL LOW (ref >60)
Glucose, Bld: 139 mg/dL — ABNORMAL HIGH (ref 70–99)
Potassium: 4.5 mmol/L (ref 3.5–5.1)
SODIUM: 141 mmol/L (ref 135–145)
Total Bilirubin: 0.4 mg/dL (ref 0.3–1.2)
Total Protein: 7.1 g/dL (ref 6.5–8.1)

## 2017-09-23 LAB — CBC WITH DIFFERENTIAL (CANCER CENTER ONLY)
BASOS PCT: 0 %
Basophils Absolute: 0 10*3/uL (ref 0.0–0.1)
EOS ABS: 0.1 10*3/uL (ref 0.0–0.5)
EOS PCT: 3 %
HCT: 25.7 % — ABNORMAL LOW (ref 38.4–49.9)
HEMOGLOBIN: 8.3 g/dL — AB (ref 13.0–17.1)
Lymphocytes Relative: 21 %
Lymphs Abs: 0.6 10*3/uL — ABNORMAL LOW (ref 0.9–3.3)
MCH: 28 pg (ref 27.2–33.4)
MCHC: 32.3 g/dL (ref 32.0–36.0)
MCV: 86.8 fL (ref 79.3–98.0)
MONO ABS: 0.2 10*3/uL (ref 0.1–0.9)
Monocytes Relative: 6 %
NEUTROS PCT: 70 %
Neutro Abs: 2.1 10*3/uL (ref 1.5–6.5)
PLATELETS: 140 10*3/uL (ref 140–400)
RBC: 2.96 MIL/uL — ABNORMAL LOW (ref 4.20–5.82)
RDW: 12.9 % (ref 11.0–14.6)
WBC Count: 3 10*3/uL — ABNORMAL LOW (ref 4.0–10.3)

## 2017-09-23 MED ORDER — SODIUM CHLORIDE 0.9 % IV SOLN
Freq: Once | INTRAVENOUS | Status: AC
  Administered 2017-09-23: 15:00:00 via INTRAVENOUS
  Filled 2017-09-23: qty 250

## 2017-09-23 MED ORDER — PROCHLORPERAZINE MALEATE 10 MG PO TABS
ORAL_TABLET | ORAL | Status: AC
  Filled 2017-09-23: qty 1

## 2017-09-23 MED ORDER — SODIUM CHLORIDE 0.9 % IV SOLN
1000.0000 mg/m2 | Freq: Once | INTRAVENOUS | Status: AC
  Administered 2017-09-23: 1900 mg via INTRAVENOUS
  Filled 2017-09-23: qty 49.97

## 2017-09-23 MED ORDER — SODIUM CHLORIDE 0.9% FLUSH
10.0000 mL | Freq: Once | INTRAVENOUS | Status: AC
  Administered 2017-09-23: 10 mL
  Filled 2017-09-23: qty 10

## 2017-09-23 MED ORDER — SODIUM CHLORIDE 0.9% FLUSH
10.0000 mL | INTRAVENOUS | Status: DC | PRN
  Administered 2017-09-23: 10 mL
  Filled 2017-09-23: qty 10

## 2017-09-23 MED ORDER — PROCHLORPERAZINE MALEATE 10 MG PO TABS
10.0000 mg | ORAL_TABLET | Freq: Once | ORAL | Status: AC
  Administered 2017-09-23: 10 mg via ORAL

## 2017-09-23 MED ORDER — HEPARIN SOD (PORK) LOCK FLUSH 100 UNIT/ML IV SOLN
500.0000 [IU] | Freq: Once | INTRAVENOUS | Status: AC | PRN
  Administered 2017-09-23: 500 [IU]
  Filled 2017-09-23: qty 5

## 2017-09-23 NOTE — Progress Notes (Signed)
Hematology and Oncology Follow Up Visit  Andrew Joyce [id number] [birth year] 57 y.o. 09/23/2017 12:19 PM Gabriel Rung, Delphia Grates, NP   Principle Diagnosis: 57 year old with stage IVa high-grade urothelial carcinoma of the bladder with squamous differentiation diagnosed in July 2019.  He has iliac lymph node involvement.   Prior Therapy:  He is status post TURBT on July 23, 2017 which showed high-grade urothelial carcinoma with sarcomatoid feature.  Current therapy:  Systemic chemotherapy utilizing gemcitabine and cisplatin cycle 1 started on September 16, 2017.  Today is day 8 of cycle 1.  Interim History: Andrew Joyce presents today for a follow-up visit.  Since last visit, he had a Port-A-Cath inserted and received the first cycle of chemotherapy without any complications.  He did report some mild nausea but no vomiting.  He denies any infusion related complications or excessive fatigue.  He denies any abdominal discomfort or hematuria.  He remains active and attends to activities of daily living.  He does not report any headaches, blurry vision, syncope or seizures. Does not report any fevers, chills or sweats.  Does not report any cough, wheezing or hemoptysis.  Does not report any chest pain, palpitation, orthopnea or leg edema.  Does not report any nausea, vomiting or abdominal pain.  Does not report any constipation or diarrhea.  Does not report any skeletal complaints.    Does not report frequency, urgency or hematuria.  Does not report any skin rashes or lesions. Does not report any heat or cold intolerance.  Does not report any lymphadenopathy or petechiae.  Does not report any anxiety or depression.  Remaining review of systems is negative.    Medications: I have reviewed the patient's current medications.  Current Outpatient Medications  Medication Sig Dispense Refill  . allopurinol (ZYLOPRIM) 100 MG tablet Take 1 tablet (100 mg total) by mouth daily.  30 tablet 6  . amLODipine (NORVASC) 5 MG tablet Take 1 tablet (5 mg total) by mouth daily. 30 tablet 6  . amoxicillin (AMOXIL) 250 MG capsule Take 250 mg by mouth daily.  11  . aspirin 81 MG tablet Take 1 tablet (81 mg total) by mouth daily.    . baclofen (LIORESAL) 10 MG tablet Take 2 tablets (20 mg total) by mouth 3 (three) times daily. 180 tablet 11  . carbamazepine (TEGRETOL) 200 MG tablet TAKE 1 TABLET BY MOUTH TWICE DAILY 60 tablet 5  . fenofibrate 160 MG tablet TAKE 1 TABLET BY MOUTH ONCE DAILY 90 tablet 1  . fish oil-omega-3 fatty acids 1000 MG capsule Take 2 capsules (2 g total) by mouth 2 (two) times daily.    [name] glucose blood (ACCU-CHEK GUIDE) test strip Use to check blood sugars 2 times daily. Dx Code: E11.9 100 each 12  . isosorbide mononitrate (IMDUR) 60 MG 24 hr tablet TAKE 1 & 1/2 (ONE & ONE-HALF) TABLETS BY MOUTH ONCE DAILY 135 tablet 2  . levothyroxine (SYNTHROID, LEVOTHROID) 25 MCG tablet Take 1 tablet (25 mcg total) by mouth daily. 30 tablet 1  . lidocaine-prilocaine (EMLA) cream Apply 1 application topically as needed. 30 g 0  . losartan (COZAAR) 100 MG tablet TAKE 1 TABLET BY MOUTH ONCE DAILY 90 tablet 0  . meloxicam (MOBIC) 15 MG tablet Take 1 tablet (15 mg total) by mouth daily. 30 tablet 1  . methenamine (MANDELAMINE) 1 g tablet Take 1,000 mg by mouth 3 (three) times daily.     . metoprolol tartrate (LOPRESSOR) 50 MG tablet Take 1 tablet (  50 mg total) by mouth 2 (two) times daily. 180 tablet 1  . nitroGLYCERIN (NITROSTAT) 0.4 MG SL tablet Place 1 tablet (0.4 mg total) under the tongue every 5 (five) minutes as needed. For chest pain 25 tablet 3  . prochlorperazine (COMPAZINE) 10 MG tablet Take 1 tablet (10 mg total) by mouth every 6 (six) hours as needed for nausea or vomiting. 30 tablet 0  . rOPINIRole (REQUIP) 0.25 MG tablet TAKE 1 TABLET BY MOUTH 3 HOURS BEFORE BEDTIME 30 tablet 5  . rosuvastatin (CRESTOR) 40 MG tablet Take 1 tablet (40 mg total) by mouth daily. 30  tablet 6  . sitaGLIPtin (JANUVIA) 50 MG tablet Take 1 tablet (50 mg total) by mouth daily. 90 tablet 0  . traZODone (DESYREL) 50 MG tablet Take 0.5-1 tablets (25-50 mg total) by mouth at bedtime as needed for sleep. 30 tablet 0   No current facility-administered medications for this visit.      Allergies: No Known Allergies  Past Medical History, Surgical history, Social history, and Family History were reviewed and updated.    Physical Exam: Blood pressure (!) 102/54, pulse (!) 56, temperature 98.1 F (36.7 C), temperature source Oral, resp. rate 20, height 5\' 10"  (1.778 m), weight 155 lb 4.8 oz (70.4 kg), SpO2 100 %.   ECOG: 0 General appearance: alert and cooperative appeared without distress. Head: Normocephalic, without obvious abnormality Oropharynx: No oral thrush or ulcers. Eyes: No scleral icterus.  Pupils are equal and round reactive to light. Lymph nodes: Cervical, supraclavicular, and axillary nodes normal. Heart:regular rate and rhythm, S1, S2 normal, no murmur, click, rub or gallop Lung:chest clear, no wheezing, rales, normal symmetric air entry Abdomin: soft, non-tender, without masses or organomegaly. Neurological: No motor, sensory deficits.  Intact deep tendon reflexes. Skin: No rashes or lesions.  No ecchymosis or petechiae. Musculoskeletal: No joint deformity or effusion. Psychiatric: Mood and affect are appropriate.    Lab Results: Lab Results  Component Value Date   WBC 6.8 09/19/2017   HGB 9.4 (L) 09/19/2017   HCT 28.8 (L) 09/19/2017   MCV 87.5 09/19/2017   PLT 294 09/19/2017     Chemistry      Component Value Date/Time   NA 139 09/19/2017 1344   NA 141 07/24/2017 0942   K 4.5 09/19/2017 1344   CL 104 09/19/2017 1344   CO2 26 09/19/2017 1344   BUN 39 (H) 09/19/2017 1344   BUN 35 (H) 07/24/2017 0942   CREATININE 2.13 (H) 09/19/2017 1344   CREATININE 2.09 (H) 09/16/2017 0748   CREATININE 1.38 (H) 03/01/2015 0942      Component Value  Date/Time   CALCIUM 8.9 09/19/2017 1344   ALKPHOS 57 09/16/2017 0748   AST 11 (L) 09/16/2017 0748   ALT 8 09/16/2017 0748   BILITOT 0.3 09/16/2017 0748       Impression and Plan:  57 year old man with the following:  1.  IVa disease high-grade urothelial carcinoma of the bladder diagnosed in July 2019.    Is limited to pelvic adenopathy.  He is currently receiving systemic chemotherapy utilizing carboplatin and gemcitabine without complications.  Risks and benefits of continuing this therapy at this time was reviewed and is agreeable to continue.  Plan is to complete 6 cycles of therapy and repeat imaging studies.  He has excellent response to chemotherapy at the time, salvage cystectomy may be attempted at [facility].  He will receive gemcitabine and carboplatin on day 1, gemcitabine on day 8 of each  cycle every 21 days.  2.  IV access: Port-A-Cath inserted without any complications.  This will remain in use for the time being.  3.  Antiemetics: No issues with vomiting at this time.  Mild nausea is managed with Compazine.  4.  Chronic renal insufficiency: His kidney function remains stable at this time.  Carboplatin is adjusted accordingly.  5.  Goals of care: Goal remains curative at this time with performance status is excellent with aggressive measures are to be continued.  6.  Anemia: Appears to be related to renal insufficiency as well as chemotherapy.  We will continue to monitor his counts and transfuse his hemoglobin below 8.  7.  Follow-up: We will be in 2 weeks for the start of cycle 2 of therapy.  25 minutes was spent with the patient face-to-face today.  More than 50% of time was dedicated to reviewing the natural course of his disease, treatment options complications related to this therapy.    Zola Button, MD 9/17/201912:19 PM

## 2017-09-23 NOTE — Patient Instructions (Signed)
[facility] Discharge Instructions for Patients Receiving Chemotherapy  Today you received the following chemotherapy agents:  Gemcitabine  To help prevent nausea and vomiting after your treatment, we encourage you to take your nausea medication as prescribed.    If you develop nausea and vomiting that is not controlled by your nausea medication, call the clinic.   BELOW ARE SYMPTOMS THAT SHOULD BE REPORTED IMMEDIATELY:  *FEVER GREATER THAN 100.5 F  *CHILLS WITH OR WITHOUT FEVER  NAUSEA AND VOMITING THAT IS NOT CONTROLLED WITH YOUR NAUSEA MEDICATION  *UNUSUAL SHORTNESS OF BREATH  *UNUSUAL BRUISING OR BLEEDING  TENDERNESS IN MOUTH AND THROAT WITH OR WITHOUT PRESENCE OF ULCERS  *URINARY PROBLEMS  *BOWEL PROBLEMS  UNUSUAL RASH Items with * indicate a potential emergency and should be followed up as soon as possible.  Feel free to call the clinic should you have any questions or concerns. The clinic phone number is [phone].  Please show the CHEMO ALERT CARD at check-in to the Emergency Department and triage nurse.   

## 2017-09-23 NOTE — Telephone Encounter (Signed)
Printed avs and calender of upcoming appointment. Per 9/17 los 

## 2017-09-23 NOTE — Telephone Encounter (Signed)
error 

## 2017-09-29 ENCOUNTER — Encounter: Payer: Self-pay | Admitting: *Deleted

## 2017-10-06 ENCOUNTER — Encounter: Payer: Self-pay | Admitting: Oncology

## 2017-10-06 ENCOUNTER — Other Ambulatory Visit: Payer: Self-pay | Admitting: Oncology

## 2017-10-06 NOTE — Progress Notes (Signed)
Hematology and Oncology Follow Up Visit  Maejor Erven [id number] 1960/03/06 57 y.o. 10/06/2017 3:49 PM Gabriel Rung, Delphia Grates, NP   Principle Diagnosis: 57 year old with stage IVa high-grade urothelial carcinoma of the bladder with squamous differentiation diagnosed in July 2019.  He has iliac lymph node involvement.   Prior Therapy:  He is status post TURBT on July 23, 2017 which showed high-grade urothelial carcinoma with sarcomatoid feature.  Current therapy:  Systemic chemotherapy utilizing gemcitabine and cisplatin cycle 1 started on September 16, 2017.  Today is day 1 of cycle 2.  Interim History: Mr. Kilker presents today for a follow-up visit.  The patient reports that he is feeling well.  He denies fatigue.  He did not have any nausea or vomiting following his last cycle of chemotherapy.  He reports occasional blood clots in his urine but this is not different from his baseline.  He denies any other bleeding.  He does not report any headaches, blurry vision, syncope or seizures. Does not report any fevers, chills or sweats.  Does not report any cough, wheezing or hemoptysis. Does not report any chest pain, palpitation, orthopnea or leg edema.  Does not report any nausea, vomiting or abdominal pain.  Does not report any constipation or diarrhea. Does not report any skeletal complaints.    Does not report frequency or urgency.  Does not report any skin rashes or lesions. Does not report any heat or cold intolerance.  Does not report any lymphadenopathy or petechiae.  Does not report any anxiety or depression.  Remaining review of systems is negative.    Medications: I have reviewed the patient's current medications.  Current Outpatient Medications  Medication Sig Dispense Refill  . allopurinol (ZYLOPRIM) 100 MG tablet Take 1 tablet (100 mg total) by mouth daily. 30 tablet 6  . amLODipine (NORVASC) 5 MG tablet Take 1 tablet (5 mg total) by mouth daily.  30 tablet 6  . amoxicillin (AMOXIL) 250 MG capsule Take 250 mg by mouth daily.  11  . aspirin 81 MG tablet Take 1 tablet (81 mg total) by mouth daily.    . baclofen (LIORESAL) 10 MG tablet Take 2 tablets (20 mg total) by mouth 3 (three) times daily. 180 tablet 11  . carbamazepine (TEGRETOL) 200 MG tablet TAKE 1 TABLET BY MOUTH TWICE DAILY 60 tablet 5  . fenofibrate 160 MG tablet TAKE 1 TABLET BY MOUTH ONCE DAILY 90 tablet 1  . fish oil-omega-3 fatty acids 1000 MG capsule Take 2 capsules (2 g total) by mouth 2 (two) times daily.    [name] glucose blood (ACCU-CHEK GUIDE) test strip Use to check blood sugars 2 times daily. Dx Code: E11.9 100 each 12  . isosorbide mononitrate (IMDUR) 60 MG 24 hr tablet TAKE 1 & 1/2 (ONE & ONE-HALF) TABLETS BY MOUTH ONCE DAILY 135 tablet 2  . levothyroxine (SYNTHROID, LEVOTHROID) 25 MCG tablet Take 1 tablet (25 mcg total) by mouth daily. 30 tablet 1  . lidocaine-prilocaine (EMLA) cream Apply 1 application topically as needed. 30 g 0  . losartan (COZAAR) 100 MG tablet TAKE 1 TABLET BY MOUTH ONCE DAILY 90 tablet 0  . meloxicam (MOBIC) 15 MG tablet Take 1 tablet (15 mg total) by mouth daily. 30 tablet 1  . methenamine (MANDELAMINE) 1 g tablet Take 1,000 mg by mouth 3 (three) times daily.     . metoprolol tartrate (LOPRESSOR) 50 MG tablet Take 1 tablet (50 mg total) by mouth 2 (two) times daily. [place]  tablet 1  . nitroGLYCERIN (NITROSTAT) 0.4 MG SL tablet Place 1 tablet (0.4 mg total) under the tongue every 5 (five) minutes as needed. For chest pain 25 tablet 3  . prochlorperazine (COMPAZINE) 10 MG tablet TAKE 1 TABLET BY MOUTH EVERY 6 HOURS AS NEEDED FOR NAUSEA OR VOMITING 30 tablet 0  . rOPINIRole (REQUIP) 0.25 MG tablet TAKE 1 TABLET BY MOUTH 3 HOURS BEFORE BEDTIME 30 tablet 5  . rosuvastatin (CRESTOR) 40 MG tablet Take 1 tablet (40 mg total) by mouth daily. 30 tablet 6  . sitaGLIPtin (JANUVIA) 50 MG tablet Take 1 tablet (50 mg total) by mouth daily. 90 tablet 0  . traZODone  (DESYREL) 50 MG tablet Take 0.5-1 tablets (25-50 mg total) by mouth at bedtime as needed for sleep. 30 tablet 0   No current facility-administered medications for this visit.      Allergies: No Known Allergies  Past Medical History, Surgical history, Social history, and Family History were reviewed and updated.    Physical Exam: Blood pressure 103/66, pulse 60, temperature 98.8 F (37.1 C), temperature source Oral, resp. rate 18, height 5\' 10"  (1.778 m), weight 154 lb 6.4 oz (70 kg), SpO2 100 %.   ECOG: 0 General appearance: alert and cooperative appeared without distress. Head: Normocephalic, without obvious abnormality Oropharynx: No oral thrush or ulcers. Eyes: No scleral icterus.  Pupils are equal and round reactive to light. Lymph nodes: Cervical, supraclavicular, and axillary nodes normal. Heart:regular rate and rhythm, S1, S2 normal, no murmur, click, rub or gallop Lung:chest clear, no wheezing, rales, normal symmetric air entry Abdomin: soft, non-tender, without masses or organomegaly. Neurological: No motor, sensory deficits.  Intact deep tendon reflexes. Skin: No rashes or lesions.  No ecchymosis or petechiae. Musculoskeletal: No joint deformity or effusion. Psychiatric: Mood and affect are appropriate.    Lab Results: Lab Results  Component Value Date   WBC 4.2 10/07/2017   HGB 7.5 (L) 10/07/2017   HCT 22.6 (L) 10/07/2017   MCV 85.0 10/07/2017   PLT 559 (H) 10/07/2017     Chemistry      Component Value Date/Time   NA 140 10/07/2017 1251   NA 141 07/24/2017 0942   K 4.9 10/07/2017 1251   CL 105 10/07/2017 1251   CO2 27 10/07/2017 1251   BUN 35 (H) 10/07/2017 1251   BUN 35 (H) 07/24/2017 0942   CREATININE 3.16 (HH) 10/07/2017 1251   CREATININE 1.38 (H) 03/01/2015 0942      Component Value Date/Time   CALCIUM 9.2 10/07/2017 1251   ALKPHOS 53 10/07/2017 1251   AST 16 10/07/2017 1251   ALT 12 10/07/2017 1251   BILITOT <0.2 (L) 10/07/2017 1251        Impression and Plan:  57 year old man with the following:  1.  IVa disease high-grade urothelial carcinoma of the bladder diagnosed in July 2019.    Is limited to pelvic adenopathy.  He is currently receiving systemic chemotherapy utilizing carboplatin and gemcitabine without complications.  Risks and benefits of continuing this therapy at this time was reviewed and is agreeable to continue.  Plan is to complete 6 cycles of therapy and repeat imaging studies.  He has excellent response to chemotherapy at the time, salvage cystectomy may be attempted at [facility].  He will receive gemcitabine and carboplatin on day 1, gemcitabine on day 8 of each cycle every 21 days.  Labs from today have been reviewed with Dr Alen Blew.  We will plan to administer day 1  of cycle 2 today as scheduled.  Carboplatin will be dose reduced secondary to renal insufficiency.  2.  IV access: Port-A-Cath inserted without any complications.  This will remain in use for the time being.  3.  Antiemetics: No issues with vomiting at this time.  He has Compazine available to him.  4.  Chronic renal insufficiency: Creatinine is up slightly today to 3.16.  We will plan to administer chemotherapy today as scheduled.  Carboplatin dose will be adjusted based on renal function.  5.  Goals of care: Goal remains curative at this time with performance status is excellent with aggressive measures are to be continued.  6.  Anemia: Appears to be related to renal insufficiency as well as chemotherapy.  Hemoglobin is 7.5 today.  He is asymptomatic.  We will hold off on a blood transfusion at this time.  I did discuss with him that he may need a blood transfusion at some point down the road if his hemoglobin drops to less than 7 or he becomes symptomatic.  7.  Follow-up: He will return next week for labs and chemotherapy.  Follow-up visit will be in 3 weeks for evaluation prior to day 1 of cycle 3.   Mikey Bussing, DNP,  AGPCNP-BC, AOCNP 9/30/20193:49 PM

## 2017-10-06 NOTE — Progress Notes (Signed)
Called pt to introduce myself as his Arboriculturist.  Pt has 2 insurances so copay assistance isn't needed at this time.  I wanted to inform him of the [place] so I left a msg requesting he return my call.  If I don't hear from him I will plan to meet him on 10/07/17 to discuss the grant.

## 2017-10-07 ENCOUNTER — Inpatient Hospital Stay: Payer: Medicare Other

## 2017-10-07 ENCOUNTER — Inpatient Hospital Stay: Payer: Medicare Other | Attending: Oncology | Admitting: Oncology

## 2017-10-07 ENCOUNTER — Other Ambulatory Visit: Payer: Self-pay | Admitting: Oncology

## 2017-10-07 ENCOUNTER — Encounter: Payer: Self-pay | Admitting: Oncology

## 2017-10-07 VITALS — BP 103/66 | HR 60 | Temp 98.8°F | Resp 18 | Ht 70.0 in | Wt 154.4 lb

## 2017-10-07 DIAGNOSIS — Z7189 Other specified counseling: Secondary | ICD-10-CM | POA: Diagnosis not present

## 2017-10-07 DIAGNOSIS — C775 Secondary and unspecified malignant neoplasm of intrapelvic lymph nodes: Secondary | ICD-10-CM | POA: Insufficient documentation

## 2017-10-07 DIAGNOSIS — C679 Malignant neoplasm of bladder, unspecified: Secondary | ICD-10-CM | POA: Diagnosis not present

## 2017-10-07 DIAGNOSIS — Z95828 Presence of other vascular implants and grafts: Secondary | ICD-10-CM

## 2017-10-07 DIAGNOSIS — N189 Chronic kidney disease, unspecified: Secondary | ICD-10-CM

## 2017-10-07 DIAGNOSIS — D649 Anemia, unspecified: Secondary | ICD-10-CM

## 2017-10-07 DIAGNOSIS — Z5111 Encounter for antineoplastic chemotherapy: Secondary | ICD-10-CM | POA: Diagnosis not present

## 2017-10-07 DIAGNOSIS — Z79899 Other long term (current) drug therapy: Secondary | ICD-10-CM | POA: Diagnosis not present

## 2017-10-07 DIAGNOSIS — D631 Anemia in chronic kidney disease: Secondary | ICD-10-CM

## 2017-10-07 LAB — CBC WITH DIFFERENTIAL (CANCER CENTER ONLY)
BASOS ABS: 0 10*3/uL (ref 0.0–0.1)
BASOS PCT: 1 %
EOS ABS: 0.1 10*3/uL (ref 0.0–0.5)
EOS PCT: 2 %
HCT: 22.6 % — ABNORMAL LOW (ref 38.4–49.9)
Hemoglobin: 7.5 g/dL — ABNORMAL LOW (ref 13.0–17.1)
LYMPHS PCT: 29 %
Lymphs Abs: 1.2 10*3/uL (ref 0.9–3.3)
MCH: 28.4 pg (ref 27.2–33.4)
MCHC: 33.4 g/dL (ref 32.0–36.0)
MCV: 85 fL (ref 79.3–98.0)
Monocytes Absolute: 0.9 10*3/uL (ref 0.1–0.9)
Monocytes Relative: 21 %
Neutro Abs: 2 10*3/uL (ref 1.5–6.5)
Neutrophils Relative %: 47 %
PLATELETS: 559 10*3/uL — AB (ref 140–400)
RBC: 2.66 MIL/uL — AB (ref 4.20–5.82)
RDW: 14 % (ref 11.0–14.6)
WBC: 4.2 10*3/uL (ref 4.0–10.3)

## 2017-10-07 LAB — CMP (CANCER CENTER ONLY)
ALBUMIN: 3 g/dL — AB (ref 3.5–5.0)
ALT: 12 U/L (ref 0–44)
AST: 16 U/L (ref 15–41)
Alkaline Phosphatase: 53 U/L (ref 38–126)
Anion gap: 8 (ref 5–15)
BUN: 35 mg/dL — AB (ref 6–20)
CHLORIDE: 105 mmol/L (ref 98–111)
CO2: 27 mmol/L (ref 22–32)
Calcium: 9.2 mg/dL (ref 8.9–10.3)
Creatinine: 3.16 mg/dL (ref 0.61–1.24)
GFR, Est AFR Am: 23 mL/min — ABNORMAL LOW (ref >60)
GFR, Estimated: 20 mL/min — ABNORMAL LOW (ref >60)
GLUCOSE: 144 mg/dL — AB (ref 70–99)
Potassium: 4.9 mmol/L (ref 3.5–5.1)
Sodium: 140 mmol/L (ref 135–145)
Total Bilirubin: <0.2 mg/dL — ABNORMAL LOW (ref 0.3–1.2)
Total Protein: 7.2 g/dL (ref 6.5–8.1)

## 2017-10-07 MED ORDER — HEPARIN SOD (PORK) LOCK FLUSH 100 UNIT/ML IV SOLN
500.0000 [IU] | Freq: Once | INTRAVENOUS | Status: AC | PRN
  Administered 2017-10-07: 500 [IU]
  Filled 2017-10-07: qty 5

## 2017-10-07 MED ORDER — SODIUM CHLORIDE 0.9 % IV SOLN
250.0000 mg | Freq: Once | INTRAVENOUS | Status: AC
  Administered 2017-10-07: 250 mg via INTRAVENOUS
  Filled 2017-10-07: qty 25

## 2017-10-07 MED ORDER — SODIUM CHLORIDE 0.9% FLUSH
10.0000 mL | Freq: Once | INTRAVENOUS | Status: AC
  Administered 2017-10-07: 10 mL
  Filled 2017-10-07: qty 10

## 2017-10-07 MED ORDER — PALONOSETRON HCL INJECTION 0.25 MG/5ML
INTRAVENOUS | Status: AC
  Filled 2017-10-07: qty 5

## 2017-10-07 MED ORDER — SODIUM CHLORIDE 0.9 % IV SOLN
Freq: Once | INTRAVENOUS | Status: AC
  Administered 2017-10-07: 15:00:00 via INTRAVENOUS
  Filled 2017-10-07: qty 5

## 2017-10-07 MED ORDER — SODIUM CHLORIDE 0.9 % IV SOLN
Freq: Once | INTRAVENOUS | Status: AC
  Administered 2017-10-07: 15:00:00 via INTRAVENOUS
  Filled 2017-10-07: qty 250

## 2017-10-07 MED ORDER — PALONOSETRON HCL INJECTION 0.25 MG/5ML
0.2500 mg | Freq: Once | INTRAVENOUS | Status: AC
  Administered 2017-10-07: 0.25 mg via INTRAVENOUS

## 2017-10-07 MED ORDER — SODIUM CHLORIDE 0.9 % IV SOLN
1000.0000 mg/m2 | Freq: Once | INTRAVENOUS | Status: AC
  Administered 2017-10-07: 1900 mg via INTRAVENOUS
  Filled 2017-10-07: qty 50

## 2017-10-07 MED ORDER — SODIUM CHLORIDE 0.9% FLUSH
10.0000 mL | INTRAVENOUS | Status: DC | PRN
  Administered 2017-10-07: 10 mL
  Filled 2017-10-07: qty 10

## 2017-10-07 NOTE — Patient Instructions (Signed)
[place] Discharge Instructions for Patients Receiving Chemotherapy  Today you received the following chemotherapy agents gemcitabine (Gemzar) and carboplatin (Paraplatin)  To help prevent nausea and vomiting after your treatment, we encourage you to take your nausea medication as directed by your doctor.   If you develop nausea and vomiting that is not controlled by your nausea medication, call the clinic.   BELOW ARE SYMPTOMS THAT SHOULD BE REPORTED IMMEDIATELY:  *FEVER GREATER THAN 100.5 F  *CHILLS WITH OR WITHOUT FEVER  NAUSEA AND VOMITING THAT IS NOT CONTROLLED WITH YOUR NAUSEA MEDICATION  *UNUSUAL SHORTNESS OF BREATH  *UNUSUAL BRUISING OR BLEEDING  TENDERNESS IN MOUTH AND THROAT WITH OR WITHOUT PRESENCE OF ULCERS  *URINARY PROBLEMS  *BOWEL PROBLEMS  UNUSUAL RASH Items with * indicate a potential emergency and should be followed up as soon as possible.  Feel free to call the clinic should you have any questions or concerns. The clinic phone number is [phone].  Please show the [place] at check-in to the Emergency Department and triage nurse.

## 2017-10-07 NOTE — Progress Notes (Signed)
Per Mikey Bussing, NP & Dr. Alen Blew, ok for tx today w/ SCr = 3.16  Kennith Center, Pharm.D., CPP 10/07/2017@2 :04 PM

## 2017-10-14 ENCOUNTER — Other Ambulatory Visit: Payer: Self-pay | Admitting: Nurse Practitioner

## 2017-10-14 ENCOUNTER — Inpatient Hospital Stay: Payer: Medicare Other

## 2017-10-14 ENCOUNTER — Other Ambulatory Visit: Payer: Self-pay | Admitting: Cardiology

## 2017-10-14 VITALS — BP 115/67 | HR 55 | Temp 98.9°F | Resp 17

## 2017-10-14 DIAGNOSIS — Z95828 Presence of other vascular implants and grafts: Secondary | ICD-10-CM

## 2017-10-14 DIAGNOSIS — Z79899 Other long term (current) drug therapy: Secondary | ICD-10-CM | POA: Diagnosis not present

## 2017-10-14 DIAGNOSIS — D631 Anemia in chronic kidney disease: Secondary | ICD-10-CM

## 2017-10-14 DIAGNOSIS — Z5111 Encounter for antineoplastic chemotherapy: Secondary | ICD-10-CM | POA: Diagnosis not present

## 2017-10-14 DIAGNOSIS — N189 Chronic kidney disease, unspecified: Secondary | ICD-10-CM

## 2017-10-14 DIAGNOSIS — C679 Malignant neoplasm of bladder, unspecified: Secondary | ICD-10-CM

## 2017-10-14 DIAGNOSIS — D649 Anemia, unspecified: Secondary | ICD-10-CM | POA: Diagnosis not present

## 2017-10-14 DIAGNOSIS — G4700 Insomnia, unspecified: Secondary | ICD-10-CM

## 2017-10-14 DIAGNOSIS — C775 Secondary and unspecified malignant neoplasm of intrapelvic lymph nodes: Secondary | ICD-10-CM | POA: Diagnosis not present

## 2017-10-14 LAB — CBC WITH DIFFERENTIAL (CANCER CENTER ONLY)
BASOS ABS: 0 10*3/uL (ref 0.0–0.1)
Basophils Relative: 1 %
EOS ABS: 0 10*3/uL (ref 0.0–0.5)
EOS PCT: 0 %
HCT: 22.4 % — ABNORMAL LOW (ref 38.4–49.9)
Hemoglobin: 7.1 g/dL — ABNORMAL LOW (ref 13.0–17.0)
LYMPHS ABS: 0.7 10*3/uL — AB (ref 0.9–3.3)
Lymphocytes Relative: 16 %
MCH: 27.7 pg (ref 27.2–33.4)
MCHC: 31.7 g/dL — AB (ref 32.0–36.0)
MCV: 87.5 fL (ref 79.3–98.0)
Monocytes Absolute: 0.7 10*3/uL (ref 0.1–0.9)
Monocytes Relative: 15 %
NRBC: 0 % (ref 0.0–0.2)
Neutro Abs: 3.1 10*3/uL (ref 1.5–6.5)
Neutrophils Relative %: 68 %
Platelet Count: 331 10*3/uL (ref 150–400)
RBC: 2.56 MIL/uL — ABNORMAL LOW (ref 4.20–5.82)
RDW: 14.2 % (ref 11.0–14.6)
WBC: 4.5 10*3/uL (ref 4.0–10.5)

## 2017-10-14 LAB — CMP (CANCER CENTER ONLY)
ALK PHOS: 60 U/L (ref 38–126)
ALT: 26 U/L (ref 0–44)
AST: 27 U/L (ref 15–41)
Albumin: 3 g/dL — ABNORMAL LOW (ref 3.5–5.0)
Anion gap: 11 (ref 5–15)
BUN: 40 mg/dL — AB (ref 6–20)
CALCIUM: 9.3 mg/dL (ref 8.9–10.3)
CO2: 25 mmol/L (ref 22–32)
Chloride: 105 mmol/L (ref 98–111)
Creatinine: 1.93 mg/dL — ABNORMAL HIGH (ref 0.61–1.24)
GFR, EST NON AFRICAN AMERICAN: 36 mL/min — AB (ref >60)
GFR, Est AFR Am: 42 mL/min — ABNORMAL LOW (ref >60)
Glucose, Bld: 146 mg/dL — ABNORMAL HIGH (ref 70–99)
Potassium: 5 mmol/L (ref 3.5–5.1)
Sodium: 141 mmol/L (ref 135–145)
TOTAL PROTEIN: 7.2 g/dL (ref 6.5–8.1)
Total Bilirubin: <0.2 mg/dL — ABNORMAL LOW (ref 0.3–1.2)

## 2017-10-14 LAB — SAMPLE TO BLOOD BANK

## 2017-10-14 MED ORDER — SODIUM CHLORIDE 0.9 % IV SOLN
1000.0000 mg/m2 | Freq: Once | INTRAVENOUS | Status: AC
  Administered 2017-10-14: 1900 mg via INTRAVENOUS
  Filled 2017-10-14: qty 49.97

## 2017-10-14 MED ORDER — SODIUM CHLORIDE 0.9% FLUSH
10.0000 mL | Freq: Once | INTRAVENOUS | Status: AC
  Administered 2017-10-14: 10 mL
  Filled 2017-10-14: qty 10

## 2017-10-14 MED ORDER — SODIUM CHLORIDE 0.9 % IV SOLN
Freq: Once | INTRAVENOUS | Status: AC
  Administered 2017-10-14: 09:00:00 via INTRAVENOUS
  Filled 2017-10-14: qty 250

## 2017-10-14 MED ORDER — HEPARIN SOD (PORK) LOCK FLUSH 100 UNIT/ML IV SOLN
500.0000 [IU] | Freq: Once | INTRAVENOUS | Status: AC | PRN
  Administered 2017-10-14: 500 [IU]
  Filled 2017-10-14: qty 5

## 2017-10-14 MED ORDER — PROCHLORPERAZINE MALEATE 10 MG PO TABS
ORAL_TABLET | ORAL | Status: AC
  Filled 2017-10-14: qty 1

## 2017-10-14 MED ORDER — SODIUM CHLORIDE 0.9% FLUSH
10.0000 mL | INTRAVENOUS | Status: DC | PRN
  Administered 2017-10-14: 10 mL
  Filled 2017-10-14: qty 10

## 2017-10-14 MED ORDER — PROCHLORPERAZINE MALEATE 10 MG PO TABS
10.0000 mg | ORAL_TABLET | Freq: Once | ORAL | Status: AC
  Administered 2017-10-14: 10 mg via ORAL

## 2017-10-14 NOTE — Progress Notes (Signed)
Per Dr. Alen Blew, ok to treat today with labs from today.

## 2017-10-14 NOTE — Patient Instructions (Signed)
[facility] Discharge Instructions for Patients Receiving Chemotherapy  Today you received the following chemotherapy agents gemcitabine (Gemzar)  To help prevent nausea and vomiting after your treatment, we encourage you to take your nausea medication as directed by your doctor.   If you develop nausea and vomiting that is not controlled by your nausea medication, call the clinic.   BELOW ARE SYMPTOMS THAT SHOULD BE REPORTED IMMEDIATELY:  *FEVER GREATER THAN 100.5 F  *CHILLS WITH OR WITHOUT FEVER  NAUSEA AND VOMITING THAT IS NOT CONTROLLED WITH YOUR NAUSEA MEDICATION  *UNUSUAL SHORTNESS OF BREATH  *UNUSUAL BRUISING OR BLEEDING  TENDERNESS IN MOUTH AND THROAT WITH OR WITHOUT PRESENCE OF ULCERS  *URINARY PROBLEMS  *BOWEL PROBLEMS  UNUSUAL RASH Items with * indicate a potential emergency and should be followed up as soon as possible.  Feel free to call the clinic should you have any questions or concerns. The clinic phone number is [phone].  Please show the CHEMO ALERT CARD at check-in to the Emergency Department and triage nurse.   

## 2017-10-21 ENCOUNTER — Other Ambulatory Visit: Payer: Self-pay | Admitting: Oncology

## 2017-10-28 ENCOUNTER — Inpatient Hospital Stay (HOSPITAL_BASED_OUTPATIENT_CLINIC_OR_DEPARTMENT_OTHER): Payer: Medicare Other | Admitting: Oncology

## 2017-10-28 ENCOUNTER — Inpatient Hospital Stay: Payer: Medicare Other

## 2017-10-28 VITALS — BP 98/62 | HR 54 | Temp 98.7°F | Resp 17 | Ht 70.0 in | Wt 154.0 lb

## 2017-10-28 DIAGNOSIS — C61 Malignant neoplasm of prostate: Secondary | ICD-10-CM

## 2017-10-28 DIAGNOSIS — C679 Malignant neoplasm of bladder, unspecified: Secondary | ICD-10-CM

## 2017-10-28 DIAGNOSIS — D630 Anemia in neoplastic disease: Secondary | ICD-10-CM

## 2017-10-28 DIAGNOSIS — Z79899 Other long term (current) drug therapy: Secondary | ICD-10-CM | POA: Diagnosis not present

## 2017-10-28 DIAGNOSIS — N289 Disorder of kidney and ureter, unspecified: Secondary | ICD-10-CM

## 2017-10-28 DIAGNOSIS — C775 Secondary and unspecified malignant neoplasm of intrapelvic lymph nodes: Secondary | ICD-10-CM | POA: Diagnosis not present

## 2017-10-28 DIAGNOSIS — N189 Chronic kidney disease, unspecified: Secondary | ICD-10-CM

## 2017-10-28 DIAGNOSIS — Z95828 Presence of other vascular implants and grafts: Secondary | ICD-10-CM

## 2017-10-28 DIAGNOSIS — Z7189 Other specified counseling: Secondary | ICD-10-CM

## 2017-10-28 DIAGNOSIS — D649 Anemia, unspecified: Secondary | ICD-10-CM | POA: Diagnosis not present

## 2017-10-28 DIAGNOSIS — Z5111 Encounter for antineoplastic chemotherapy: Secondary | ICD-10-CM | POA: Diagnosis not present

## 2017-10-28 LAB — CMP (CANCER CENTER ONLY)
ALBUMIN: 3.2 g/dL — AB (ref 3.5–5.0)
ALT: 13 U/L (ref 0–44)
AST: 15 U/L (ref 15–41)
Alkaline Phosphatase: 62 U/L (ref 38–126)
Anion gap: 9 (ref 5–15)
BUN: 42 mg/dL — AB (ref 6–20)
CHLORIDE: 105 mmol/L (ref 98–111)
CO2: 25 mmol/L (ref 22–32)
CREATININE: 2.04 mg/dL — AB (ref 0.61–1.24)
Calcium: 9.5 mg/dL (ref 8.9–10.3)
GFR, Est AFR Am: 39 mL/min — ABNORMAL LOW (ref >60)
GFR, Estimated: 34 mL/min — ABNORMAL LOW (ref >60)
GLUCOSE: 141 mg/dL — AB (ref 70–99)
POTASSIUM: 4.7 mmol/L (ref 3.5–5.1)
Sodium: 139 mmol/L (ref 135–145)
TOTAL PROTEIN: 7.3 g/dL (ref 6.5–8.1)
Total Bilirubin: <0.2 mg/dL — ABNORMAL LOW (ref 0.3–1.2)

## 2017-10-28 LAB — CBC WITH DIFFERENTIAL (CANCER CENTER ONLY)
Abs Immature Granulocytes: 0.04 10*3/uL (ref 0.00–0.07)
Basophils Absolute: 0 10*3/uL (ref 0.0–0.1)
Basophils Relative: 0 %
EOS PCT: 2 %
Eosinophils Absolute: 0.1 10*3/uL (ref 0.0–0.5)
HEMATOCRIT: 22.6 % — AB (ref 39.0–52.0)
Hemoglobin: 7.2 g/dL — ABNORMAL LOW (ref 13.0–17.0)
IMMATURE GRANULOCYTES: 1 %
LYMPHS ABS: 0.8 10*3/uL (ref 0.7–4.0)
Lymphocytes Relative: 14 %
MCH: 28.8 pg (ref 26.0–34.0)
MCHC: 31.9 g/dL (ref 30.0–36.0)
MCV: 90.4 fL (ref 80.0–100.0)
MONOS PCT: 16 %
Monocytes Absolute: 0.9 10*3/uL (ref 0.1–1.0)
NEUTROS PCT: 67 %
Neutro Abs: 3.9 10*3/uL (ref 1.7–7.7)
Platelet Count: 376 10*3/uL (ref 150–400)
RBC: 2.5 MIL/uL — ABNORMAL LOW (ref 4.22–5.81)
RDW: 17.6 % — ABNORMAL HIGH (ref 11.5–15.5)
WBC Count: 5.9 10*3/uL (ref 4.0–10.5)
nRBC: 0 % (ref 0.0–0.2)

## 2017-10-28 MED ORDER — SODIUM CHLORIDE 0.9 % IV SOLN
Freq: Once | INTRAVENOUS | Status: AC
  Administered 2017-10-28: 11:00:00 via INTRAVENOUS
  Filled 2017-10-28: qty 250

## 2017-10-28 MED ORDER — PALONOSETRON HCL INJECTION 0.25 MG/5ML
0.2500 mg | Freq: Once | INTRAVENOUS | Status: AC
  Administered 2017-10-28: 0.25 mg via INTRAVENOUS

## 2017-10-28 MED ORDER — PALONOSETRON HCL INJECTION 0.25 MG/5ML
INTRAVENOUS | Status: AC
  Filled 2017-10-28: qty 5

## 2017-10-28 MED ORDER — SODIUM CHLORIDE 0.9 % IV SOLN
Freq: Once | INTRAVENOUS | Status: AC
  Administered 2017-10-28: 11:00:00 via INTRAVENOUS
  Filled 2017-10-28: qty 5

## 2017-10-28 MED ORDER — SODIUM CHLORIDE 0.9 % IV SOLN
1000.0000 mg/m2 | Freq: Once | INTRAVENOUS | Status: AC
  Administered 2017-10-28: 1900 mg via INTRAVENOUS
  Filled 2017-10-28: qty 49.97

## 2017-10-28 MED ORDER — SODIUM CHLORIDE 0.9 % IV SOLN
324.5000 mg | Freq: Once | INTRAVENOUS | Status: AC
  Administered 2017-10-28: 320 mg via INTRAVENOUS
  Filled 2017-10-28: qty 32

## 2017-10-28 MED ORDER — SODIUM CHLORIDE 0.9% FLUSH
10.0000 mL | Freq: Once | INTRAVENOUS | Status: AC
  Administered 2017-10-28: 10 mL
  Filled 2017-10-28: qty 10

## 2017-10-28 MED ORDER — HEPARIN SOD (PORK) LOCK FLUSH 100 UNIT/ML IV SOLN
500.0000 [IU] | Freq: Once | INTRAVENOUS | Status: AC | PRN
  Administered 2017-10-28: 500 [IU]
  Filled 2017-10-28: qty 5

## 2017-10-28 MED ORDER — SODIUM CHLORIDE 0.9% FLUSH
10.0000 mL | INTRAVENOUS | Status: DC | PRN
  Administered 2017-10-28: 10 mL
  Filled 2017-10-28: qty 10

## 2017-10-28 NOTE — Progress Notes (Signed)
Hematology and Oncology Follow Up Visit  Andrew Joyce [id number] [birth year] 57 y.o. 10/28/2017 9:51 AM [name], NPShambley, Delphia Grates, NP   Principle Diagnosis: 57 year old with bladder cancer diagnosed in July 2019.  He was found to have stage IVa high-grade urothelial carcinoma with iliac lymph node involvement.   Prior Therapy:  He is status post TURBT on July 23, 2017 which showed high-grade urothelial carcinoma with sarcomatoid feature.  Current therapy:  Systemic chemotherapy utilizing gemcitabine and cisplatin cycle 1 started on September 16, 2017.  He is here for day 1 of cycle 3 of therapy.  Interim History: Andrew Joyce returns today for repeat evaluation.  Since last visit, he continues to tolerate systemic chemotherapy without any major complaints.  He denies any nausea, early satiety or peripheral neuropathy.  He denies any worsening fatigue or tiredness.  He denies any shortness of breath or dyspnea on exertion.  He denies any infusion related complications.  He denies any hematuria or epistaxis.  His performance status and activity level remains unchanged.  He does not report any headaches, blurry vision, syncope or seizures. Does not report any fevers, chills or sweats.  Does not report any cough, wheezing or hemoptysis.  Does not report any chest pain, palpitation, orthopnea or leg edema.  Does not report any nausea, vomiting or abdominal pain.  Does not report any constipation or diarrhea.  Does not report any skeletal complaints.    Does not report frequency, urgency or hematuria.  Does not report any skin rashes or lesions. Does not report any heat or cold intolerance.  Does not report any lymphadenopathy or petechiae.  Does not report any anxiety or depression.  Remaining review of systems is negative.    Medications: I have reviewed the patient's current medications.  Current Outpatient Medications  Medication Sig Dispense Refill  . allopurinol  (ZYLOPRIM) 100 MG tablet Take 1 tablet (100 mg total) by mouth daily. 30 tablet 6  . amLODipine (NORVASC) 5 MG tablet Take 1 tablet (5 mg total) by mouth daily. 30 tablet 6  . amoxicillin (AMOXIL) 250 MG capsule Take 250 mg by mouth daily.  11  . aspirin 81 MG tablet Take 1 tablet (81 mg total) by mouth daily.    . baclofen (LIORESAL) 10 MG tablet Take 2 tablets (20 mg total) by mouth 3 (three) times daily. 180 tablet 11  . carbamazepine (TEGRETOL) 200 MG tablet TAKE 1 TABLET BY MOUTH TWICE DAILY 60 tablet 5  . DULoxetine (CYMBALTA) 20 MG capsule Take 20 mg by mouth at bedtime. For Sleep Aide  2  . fenofibrate 160 MG tablet TAKE 1 TABLET BY MOUTH ONCE DAILY 90 tablet 1  . fish oil-omega-3 fatty acids 1000 MG capsule Take 2 capsules (2 g total) by mouth 2 (two) times daily.    [name] glucose blood (ACCU-CHEK GUIDE) test strip Use to check blood sugars 2 times daily. Dx Code: E11.9 100 each 12  . isosorbide mononitrate (IMDUR) 60 MG 24 hr tablet TAKE 1 & 1/2 (ONE & ONE-HALF) TABLETS BY MOUTH ONCE DAILY 135 tablet 2  . levothyroxine (SYNTHROID, LEVOTHROID) 25 MCG tablet Take 1 tablet (25 mcg total) by mouth daily. 30 tablet 1  . lidocaine-prilocaine (EMLA) cream Apply 1 application topically as needed. 30 g 0  . losartan (COZAAR) 100 MG tablet TAKE 1 TABLET BY MOUTH ONCE DAILY 90 tablet 0  . meloxicam (MOBIC) 15 MG tablet Take 1 tablet (15 mg total) by mouth daily. 30 tablet 1  .  methenamine (MANDELAMINE) 1 g tablet Take 1,000 mg by mouth 3 (three) times daily.     . metoprolol tartrate (LOPRESSOR) 50 MG tablet Take 1 tablet (50 mg total) by mouth 2 (two) times daily. 180 tablet 1  . nitroGLYCERIN (NITROSTAT) 0.4 MG SL tablet Place 1 tablet (0.4 mg total) under the tongue every 5 (five) minutes as needed. For chest pain 25 tablet 3  . prochlorperazine (COMPAZINE) 10 MG tablet TAKE 1 TABLET BY MOUTH EVERY 6 HOURS AS NEEDED FOR NAUSEA FOR VOMITING 30 tablet 0  . rOPINIRole (REQUIP) 0.25 MG tablet TAKE 1  TABLET BY MOUTH 3 HOURS BEFORE BEDTIME 30 tablet 5  . rosuvastatin (CRESTOR) 40 MG tablet Take 1 tablet (40 mg total) by mouth daily. 30 tablet 6  . sitaGLIPtin (JANUVIA) 50 MG tablet Take 1 tablet (50 mg total) by mouth daily. 90 tablet 0  . traMADol (ULTRAM) 50 MG tablet Take 50 mg by mouth every 6 (six) hours as needed. for pain  1  . traZODone (DESYREL) 50 MG tablet TAKE 1/2 TO 1 (ONE-HALF TO ONE) TABLET BY MOUTH AT BEDTIME AS NEEDED FOR SLEEP 30 tablet 0   No current facility-administered medications for this visit.      Allergies: No Known Allergies  Past Medical History, Surgical history, Social history, and Family History were reviewed and updated.    Physical Exam: Blood pressure 98/62, pulse (!) 54, temperature 98.7 F (37.1 C), temperature source Oral, resp. rate 17, height 5\' 10"  (1.778 m), weight 154 lb (69.9 kg), SpO2 100 %.   ECOG: 0   General appearance: Comfortable appearing without any discomfort Head: Normocephalic without any trauma Oropharynx: Mucous membranes are moist and pink without any thrush or ulcers. Eyes: Pupils are equal and round reactive to light. Lymph nodes: No cervical, supraclavicular, inguinal or axillary lymphadenopathy.   Heart:regular rate and rhythm.  S1 and S2 without leg edema. Lung: Clear without any rhonchi or wheezes.  No dullness to percussion. Abdomin: Soft, nontender, nondistended with good bowel sounds.  No hepatosplenomegaly. Musculoskeletal: No joint deformity or effusion.  Full range of motion noted. Neurological: No deficits noted on motor, sensory and deep tendon reflex exam. Skin: No petechial rash or dryness.  Appeared moist.  Psychiatric: Mood and affect appeared appropriate.     Lab Results: Lab Results  Component Value Date   WBC 4.5 10/14/2017   HGB 7.1 (L) 10/14/2017   HCT 22.4 (L) 10/14/2017   MCV 87.5 10/14/2017   PLT 331 10/14/2017     Chemistry      Component Value Date/Time   NA 141 10/14/2017 0737    NA 141 07/24/2017 0942   K 5.0 10/14/2017 0737   CL 105 10/14/2017 0737   CO2 25 10/14/2017 0737   BUN 40 (H) 10/14/2017 0737   BUN 35 (H) 07/24/2017 0942   CREATININE 1.93 (H) 10/14/2017 0737   CREATININE 1.38 (H) 03/01/2015 0942      Component Value Date/Time   CALCIUM 9.3 10/14/2017 0737   ALKPHOS 60 10/14/2017 0737   AST 27 10/14/2017 0737   ALT 26 10/14/2017 0737   BILITOT <0.2 (L) 10/14/2017 0737       Impression and Plan:  57 year old man with the following:  1.  Bladder cancer diagnosed in July 2019.  He was found to have IVa disease mid pelvic adenopathy.  He has completed 2 cycles of carboplatin and gemcitabine without any complications.  Risks and benefits of proceeding with cycle 3 was discussed  today and is agreeable to proceed ending his laboratory testing.  The plan is to complete 4-6 cycles of therapy and potentially and or go curative resection if he has a good response.  He is planned to have a PET CT scan at [facility] prior to cycle 4 of therapy.    2.  IV access: Port-A-Cath remains in use without any issues.  3.  Antiemetics: No nausea or vomiting reported.  He has antiemetics available.  4.  Chronic renal insufficiency: His creatinine is stable and carboplatin dose will be adjusted based on labs.  5.  Goals of care: Despite his advanced disease treatment remains curative.  6.  Anemia: Related to renal insufficiency and malignancy.  Risks and benefits of packed red cell transfusion was reviewed today is agreeable to proceed with tentatively next week.  7.  Follow-up: We will be in 1 week for day 8 of cycle 3 of therapy.  25 minutes was spent with the patient face-to-face today.  More than 50% of time was dedicated to discussing the natural course of his disease, treatment options and coordinating plan of care.    Zola Button, MD 10/22/20199:51 AM

## 2017-10-28 NOTE — Progress Notes (Signed)
Per Dr. Alen Blew, ok to treat with Hbg 7.2, Scr 2.04.

## 2017-10-28 NOTE — Progress Notes (Signed)
10/28/17  Clarified dose of carboplatin with Dr Alen Blew, does want to increase with new creatinine level reported.  T.O. Dr Creta Levin, PharmD

## 2017-10-31 ENCOUNTER — Other Ambulatory Visit: Payer: Self-pay

## 2017-10-31 ENCOUNTER — Telehealth: Payer: Self-pay | Admitting: Oncology

## 2017-10-31 DIAGNOSIS — N183 Chronic kidney disease, stage 3 unspecified: Secondary | ICD-10-CM

## 2017-10-31 DIAGNOSIS — C679 Malignant neoplasm of bladder, unspecified: Secondary | ICD-10-CM

## 2017-10-31 NOTE — Telephone Encounter (Signed)
Appt for Blood added to treatment on 10/29/ patient notified per 10/25 sch msg

## 2017-11-01 ENCOUNTER — Inpatient Hospital Stay: Payer: Medicare Other

## 2017-11-03 ENCOUNTER — Other Ambulatory Visit: Payer: Self-pay | Admitting: Neurology

## 2017-11-03 ENCOUNTER — Other Ambulatory Visit: Payer: Self-pay

## 2017-11-03 DIAGNOSIS — C679 Malignant neoplasm of bladder, unspecified: Secondary | ICD-10-CM

## 2017-11-04 ENCOUNTER — Inpatient Hospital Stay: Payer: Medicare Other

## 2017-11-04 ENCOUNTER — Other Ambulatory Visit: Payer: Self-pay

## 2017-11-04 VITALS — BP 126/61 | HR 53 | Temp 98.0°F | Resp 18

## 2017-11-04 DIAGNOSIS — C679 Malignant neoplasm of bladder, unspecified: Secondary | ICD-10-CM

## 2017-11-04 DIAGNOSIS — D649 Anemia, unspecified: Secondary | ICD-10-CM

## 2017-11-04 DIAGNOSIS — C775 Secondary and unspecified malignant neoplasm of intrapelvic lymph nodes: Secondary | ICD-10-CM | POA: Diagnosis not present

## 2017-11-04 DIAGNOSIS — Z79899 Other long term (current) drug therapy: Secondary | ICD-10-CM | POA: Diagnosis not present

## 2017-11-04 DIAGNOSIS — N189 Chronic kidney disease, unspecified: Secondary | ICD-10-CM | POA: Diagnosis not present

## 2017-11-04 DIAGNOSIS — Z5111 Encounter for antineoplastic chemotherapy: Secondary | ICD-10-CM | POA: Diagnosis not present

## 2017-11-04 LAB — CBC WITH DIFFERENTIAL (CANCER CENTER ONLY)
ABS IMMATURE GRANULOCYTES: 0.02 10*3/uL (ref 0.00–0.07)
Basophils Absolute: 0 10*3/uL (ref 0.0–0.1)
Basophils Relative: 1 %
Eosinophils Absolute: 0.1 10*3/uL (ref 0.0–0.5)
Eosinophils Relative: 2 %
HCT: 21.7 % — ABNORMAL LOW (ref 39.0–52.0)
HEMOGLOBIN: 7 g/dL — AB (ref 13.0–17.0)
Immature Granulocytes: 1 %
LYMPHS PCT: 18 %
Lymphs Abs: 0.6 10*3/uL — ABNORMAL LOW (ref 0.7–4.0)
MCH: 29.2 pg (ref 26.0–34.0)
MCHC: 32.3 g/dL (ref 30.0–36.0)
MCV: 90.4 fL (ref 80.0–100.0)
MONO ABS: 0.3 10*3/uL (ref 0.1–1.0)
Monocytes Relative: 9 %
NEUTROS ABS: 2.4 10*3/uL (ref 1.7–7.7)
Neutrophils Relative %: 69 %
Platelet Count: 336 10*3/uL (ref 150–400)
RBC: 2.4 MIL/uL — ABNORMAL LOW (ref 4.22–5.81)
RDW: 17.8 % — ABNORMAL HIGH (ref 11.5–15.5)
WBC Count: 3.4 10*3/uL — ABNORMAL LOW (ref 4.0–10.5)
nRBC: 0 % (ref 0.0–0.2)

## 2017-11-04 LAB — CMP (CANCER CENTER ONLY)
ALK PHOS: 64 U/L (ref 38–126)
ALT: 28 U/L (ref 0–44)
AST: 31 U/L (ref 15–41)
Albumin: 3.1 g/dL — ABNORMAL LOW (ref 3.5–5.0)
Anion gap: 9 (ref 5–15)
BUN: 44 mg/dL — ABNORMAL HIGH (ref 6–20)
CALCIUM: 9.4 mg/dL (ref 8.9–10.3)
CO2: 26 mmol/L (ref 22–32)
CREATININE: 2.03 mg/dL — AB (ref 0.61–1.24)
Chloride: 103 mmol/L (ref 98–111)
GFR, EST AFRICAN AMERICAN: 40 mL/min — AB (ref >60)
GFR, Estimated: 34 mL/min — ABNORMAL LOW (ref >60)
Glucose, Bld: 189 mg/dL — ABNORMAL HIGH (ref 70–99)
Potassium: 4.7 mmol/L (ref 3.5–5.1)
SODIUM: 138 mmol/L (ref 135–145)
Total Bilirubin: 0.3 mg/dL (ref 0.3–1.2)
Total Protein: 7.3 g/dL (ref 6.5–8.1)

## 2017-11-04 LAB — PREPARE RBC (CROSSMATCH)

## 2017-11-04 LAB — SAMPLE TO BLOOD BANK

## 2017-11-04 LAB — ABO/RH: ABO/RH(D): O POS

## 2017-11-04 MED ORDER — ACETAMINOPHEN 325 MG PO TABS
ORAL_TABLET | ORAL | Status: AC
  Filled 2017-11-04: qty 2

## 2017-11-04 MED ORDER — DIPHENHYDRAMINE HCL 25 MG PO CAPS
25.0000 mg | ORAL_CAPSULE | Freq: Once | ORAL | Status: AC
  Administered 2017-11-04: 25 mg via ORAL

## 2017-11-04 MED ORDER — PROCHLORPERAZINE MALEATE 10 MG PO TABS
10.0000 mg | ORAL_TABLET | Freq: Once | ORAL | Status: AC
  Administered 2017-11-04: 10 mg via ORAL

## 2017-11-04 MED ORDER — SODIUM CHLORIDE 0.9 % IV SOLN
1000.0000 mg/m2 | Freq: Once | INTRAVENOUS | Status: AC
  Administered 2017-11-04: 1900 mg via INTRAVENOUS
  Filled 2017-11-04: qty 49.97

## 2017-11-04 MED ORDER — SODIUM CHLORIDE 0.9 % IV SOLN
Freq: Once | INTRAVENOUS | Status: AC
  Administered 2017-11-04: 10:00:00 via INTRAVENOUS
  Filled 2017-11-04: qty 250

## 2017-11-04 MED ORDER — PROCHLORPERAZINE MALEATE 10 MG PO TABS
ORAL_TABLET | ORAL | Status: AC
  Filled 2017-11-04: qty 1

## 2017-11-04 MED ORDER — DIPHENHYDRAMINE HCL 25 MG PO CAPS
ORAL_CAPSULE | ORAL | Status: AC
  Filled 2017-11-04: qty 1

## 2017-11-04 MED ORDER — HEPARIN SOD (PORK) LOCK FLUSH 100 UNIT/ML IV SOLN
500.0000 [IU] | Freq: Once | INTRAVENOUS | Status: DC | PRN
  Filled 2017-11-04: qty 5

## 2017-11-04 MED ORDER — ACETAMINOPHEN 325 MG PO TABS
650.0000 mg | ORAL_TABLET | Freq: Once | ORAL | Status: AC
  Administered 2017-11-04: 650 mg via ORAL

## 2017-11-04 MED ORDER — SODIUM CHLORIDE 0.9% FLUSH
10.0000 mL | INTRAVENOUS | Status: DC | PRN
  Filled 2017-11-04: qty 10

## 2017-11-04 NOTE — Progress Notes (Signed)
Per Dr. Alen Blew, ok to treat today.

## 2017-11-04 NOTE — Patient Instructions (Addendum)
Blood Transfusion, Care After This sheet gives you information about how to care for yourself after your procedure. Your doctor may also give you more specific instructions. If you have problems or questions, contact your doctor. Follow these instructions at home:  Take over-the-counter and prescription medicines only as told by your doctor.  Go back to your normal activities as told by your doctor.  Follow instructions from your doctor about how to take care of the area where an IV tube was put into your vein (insertion site). Make sure you: ? Wash your hands with soap and water before you change your bandage (dressing). If there is no soap and water, use hand sanitizer. ? Change your bandage as told by your doctor.  Check your IV insertion site every day for signs of infection. Check for: ? More redness, swelling, or pain. ? More fluid or blood. ? Warmth. ? Pus or a bad smell. Contact a doctor if:  You have more redness, swelling, or pain around the IV insertion site..  You have more fluid or blood coming from the IV insertion site.  Your IV insertion site feels warm to the touch.  You have pus or a bad smell coming from the IV insertion site.  Your pee (urine) turns pink, red, or brown.  You feel weak after doing your normal activities. Get help right away if:  You have signs of a serious allergic or body defense (immune) system reaction, including: ? Itchiness. ? Hives. ? Trouble breathing. ? Anxiety. ? Pain in your chest or lower back. ? Fever, flushing, and chills. ? Fast pulse. ? Rash. ? Watery poop (diarrhea). ? Throwing up (vomiting). ? Dark pee. ? Serious headache. ? Dizziness. ? Stiff neck. ? Yellow color in your face or the white parts of your eyes (jaundice). Summary  After a blood transfusion, return to your normal activities as told by your doctor.  Every day, check for signs of infection where the IV tube was put into your vein.  Some signs of  infection are warm skin, more redness and pain, more fluid or blood, and pus or a bad smell where the needle went in.  Contact your doctor if you feel weak or have any unusual symptoms. This information is not intended to replace advice given to you by your health care provider. Make sure you discuss any questions you have with your health care provider. Document Released: 01/14/2014 Document Revised: 08/18/2015 Document Reviewed: 08/18/2015 Elsevier Interactive Patient Education  2017 [place] Discharge Instructions for Patients Receiving Chemotherapy    Today you received the following chemotherapy agents gemcitabine (Gemzar)  To help prevent nausea and vomiting after your treatment, we encourage you to take your nausea medication as directed by your doctor.   If you develop nausea and vomiting that is not controlled by your nausea medication, call the clinic.   BELOW ARE SYMPTOMS THAT SHOULD BE REPORTED IMMEDIATELY:  *FEVER GREATER THAN 100.5 F  *CHILLS WITH OR WITHOUT FEVER  NAUSEA AND VOMITING THAT IS NOT CONTROLLED WITH YOUR NAUSEA MEDICATION  *UNUSUAL SHORTNESS OF BREATH  *UNUSUAL BRUISING OR BLEEDING  TENDERNESS IN MOUTH AND THROAT WITH OR WITHOUT PRESENCE OF ULCERS  *URINARY PROBLEMS  *BOWEL PROBLEMS  UNUSUAL RASH Items with * indicate a potential emergency and should be followed up as soon as possible.  Feel free to call the clinic should you have any questions or concerns. The clinic phone number is [phone].  Please show  the [place] at check-in to the Emergency Department and triage nurse.

## 2017-11-05 LAB — TYPE AND SCREEN
ABO/RH(D): O POS
ANTIBODY SCREEN: NEGATIVE
Unit division: 0
Unit division: 0

## 2017-11-05 LAB — BPAM RBC
BLOOD PRODUCT EXPIRATION DATE: 201911262359
Blood Product Expiration Date: 201911262359
ISSUE DATE / TIME: 201910291338
ISSUE DATE / TIME: 201910291001
UNIT TYPE AND RH: 5100
Unit Type and Rh: 5100

## 2017-11-07 ENCOUNTER — Ambulatory Visit: Payer: Medicare Other | Admitting: Neurology

## 2017-11-10 DIAGNOSIS — N50819 Testicular pain, unspecified: Secondary | ICD-10-CM | POA: Diagnosis not present

## 2017-11-10 DIAGNOSIS — G8929 Other chronic pain: Secondary | ICD-10-CM | POA: Diagnosis not present

## 2017-11-10 DIAGNOSIS — G894 Chronic pain syndrome: Secondary | ICD-10-CM | POA: Diagnosis not present

## 2017-11-10 DIAGNOSIS — M898X1 Other specified disorders of bone, shoulder: Secondary | ICD-10-CM | POA: Diagnosis not present

## 2017-11-10 DIAGNOSIS — R109 Unspecified abdominal pain: Secondary | ICD-10-CM | POA: Diagnosis not present

## 2017-11-12 ENCOUNTER — Other Ambulatory Visit: Payer: Self-pay | Admitting: Nurse Practitioner

## 2017-11-12 DIAGNOSIS — G4700 Insomnia, unspecified: Secondary | ICD-10-CM

## 2017-11-14 ENCOUNTER — Ambulatory Visit: Payer: Medicare Other | Admitting: Nurse Practitioner

## 2017-11-14 DIAGNOSIS — Z0289 Encounter for other administrative examinations: Secondary | ICD-10-CM

## 2017-11-18 ENCOUNTER — Inpatient Hospital Stay: Payer: Medicare Other | Admitting: Oncology

## 2017-11-18 ENCOUNTER — Inpatient Hospital Stay: Payer: Medicare Other

## 2017-11-18 ENCOUNTER — Telehealth: Payer: Self-pay

## 2017-11-18 ENCOUNTER — Telehealth: Payer: Self-pay | Admitting: Oncology

## 2017-11-18 NOTE — Telephone Encounter (Signed)
Contacted patient left VM message and then contacted wife regarding missed appointment. She stated that the patient is not receiving treatment for the month of November per the [facility] team. She stated that he will have an MRI and then follow up on 12/2 to decide ongoing care plans. Per Dr. Alen Blew the 11/19 appointment has been cancelled.

## 2017-11-18 NOTE — Telephone Encounter (Signed)
Nov appointment already cancelled by desk nurse. Pending follow [facility].  Scheduling message from Nov 11 removed from scheduling inbasket

## 2017-11-25 ENCOUNTER — Other Ambulatory Visit: Payer: Medicare Other

## 2017-11-25 ENCOUNTER — Ambulatory Visit: Payer: Medicare Other

## 2017-11-28 DIAGNOSIS — T83092A Other mechanical complication of nephrostomy catheter, initial encounter: Secondary | ICD-10-CM | POA: Diagnosis not present

## 2017-11-28 DIAGNOSIS — Z436 Encounter for attention to other artificial openings of urinary tract: Secondary | ICD-10-CM | POA: Diagnosis not present

## 2017-11-28 DIAGNOSIS — C689 Malignant neoplasm of urinary organ, unspecified: Secondary | ICD-10-CM | POA: Diagnosis not present

## 2017-11-28 DIAGNOSIS — Z79899 Other long term (current) drug therapy: Secondary | ICD-10-CM | POA: Diagnosis not present

## 2017-12-01 DIAGNOSIS — K6289 Other specified diseases of anus and rectum: Secondary | ICD-10-CM | POA: Diagnosis not present

## 2017-12-01 DIAGNOSIS — C670 Malignant neoplasm of trigone of bladder: Secondary | ICD-10-CM | POA: Diagnosis not present

## 2017-12-01 DIAGNOSIS — N1330 Unspecified hydronephrosis: Secondary | ICD-10-CM | POA: Diagnosis not present

## 2017-12-01 DIAGNOSIS — R911 Solitary pulmonary nodule: Secondary | ICD-10-CM | POA: Diagnosis not present

## 2017-12-01 DIAGNOSIS — C679 Malignant neoplasm of bladder, unspecified: Secondary | ICD-10-CM | POA: Diagnosis not present

## 2017-12-01 DIAGNOSIS — R590 Localized enlarged lymph nodes: Secondary | ICD-10-CM | POA: Diagnosis not present

## 2017-12-01 DIAGNOSIS — N329 Bladder disorder, unspecified: Secondary | ICD-10-CM | POA: Diagnosis not present

## 2017-12-01 DIAGNOSIS — N2882 Megaloureter: Secondary | ICD-10-CM | POA: Diagnosis not present

## 2017-12-08 DIAGNOSIS — C679 Malignant neoplasm of bladder, unspecified: Secondary | ICD-10-CM | POA: Diagnosis not present

## 2017-12-08 DIAGNOSIS — N3289 Other specified disorders of bladder: Secondary | ICD-10-CM | POA: Diagnosis not present

## 2017-12-10 ENCOUNTER — Other Ambulatory Visit: Payer: Self-pay | Admitting: Cardiology

## 2017-12-15 ENCOUNTER — Other Ambulatory Visit: Payer: Self-pay | Admitting: Nurse Practitioner

## 2017-12-15 DIAGNOSIS — G4700 Insomnia, unspecified: Secondary | ICD-10-CM

## 2017-12-16 NOTE — Progress Notes (Signed)
GU Location of Tumor / Histology: locally advanced urothelial carcinoma, in the setting of congenital bladder exstrophy which has grown despite gem/carbo and now has extruded through the skin.    Past/Anticipated interventions by urology, if any: bladder resection  Past/Anticipated interventions by medical oncology, if any:  Dr. Alen Blew last in October 2019  Weight changes, if any:  Wt Readings from Last 3 Encounters:  12/17/17 146 lb 12.8 oz (66.6 kg)  12/17/17 146 lb 12.8 oz (66.6 kg)  10/28/17 154 lb (69.9 kg)    Bowel/Bladder complaints, if any: He only pass a small amount of urine strough urethra. No blood through urethra.   Nausea/Vomiting, if any: no  Pain issues, if any:  Reports 3.5-4/10 pain at right kidney since PCN replaced about a few weeks ago. He is using a 12.5 mcg fentanyl patch and oxycodone 10 mg 1-2 times daily.   SAFETY ISSUES:  Prior radiation? no  Pacemaker/ICD? no  Possible current pregnancy? no  Is the patient on methotrexate? no  Current Complaints / other details:  57 year old male. Married. Lives with wife and two adopted children in [place].  BP 121/60   Pulse 87   Temp 99.5 F (37.5 C) (Oral)   Ht 5\' 10"  (1.778 m)   Wt 146 lb 12.8 oz (66.6 kg)   SpO2 100% Comment: room air  BMI 21.06 kg/m

## 2017-12-17 ENCOUNTER — Other Ambulatory Visit: Payer: Self-pay

## 2017-12-17 ENCOUNTER — Ambulatory Visit
Admission: RE | Admit: 2017-12-17 | Discharge: 2017-12-17 | Disposition: A | Discharge status: Home / Self Care | Payer: Medicare Other | Source: Ambulatory Visit | Attending: Radiation Oncology | Admitting: Radiation Oncology

## 2017-12-17 ENCOUNTER — Telehealth: Payer: Self-pay | Admitting: Oncology

## 2017-12-17 ENCOUNTER — Encounter: Payer: Self-pay | Admitting: Radiation Oncology

## 2017-12-17 VITALS — BP 121/60 | HR 87 | Temp 99.5°F | Resp 20 | Ht 70.0 in | Wt 146.8 lb

## 2017-12-17 VITALS — BP 121/60 | HR 87 | Temp 99.5°F | Ht 70.0 in | Wt 146.8 lb

## 2017-12-17 DIAGNOSIS — Z79899 Other long term (current) drug therapy: Secondary | ICD-10-CM | POA: Insufficient documentation

## 2017-12-17 DIAGNOSIS — Q6410 Exstrophy of urinary bladder, unspecified: Secondary | ICD-10-CM | POA: Diagnosis not present

## 2017-12-17 DIAGNOSIS — Z510 Encounter for antineoplastic radiation therapy: Secondary | ICD-10-CM | POA: Insufficient documentation

## 2017-12-17 DIAGNOSIS — N182 Chronic kidney disease, stage 2 (mild): Secondary | ICD-10-CM | POA: Diagnosis not present

## 2017-12-17 DIAGNOSIS — I2510 Atherosclerotic heart disease of native coronary artery without angina pectoris: Secondary | ICD-10-CM | POA: Diagnosis not present

## 2017-12-17 DIAGNOSIS — Z9221 Personal history of antineoplastic chemotherapy: Secondary | ICD-10-CM | POA: Diagnosis not present

## 2017-12-17 DIAGNOSIS — Z8719 Personal history of other diseases of the digestive system: Secondary | ICD-10-CM | POA: Insufficient documentation

## 2017-12-17 DIAGNOSIS — C679 Malignant neoplasm of bladder, unspecified: Secondary | ICD-10-CM

## 2017-12-17 DIAGNOSIS — E785 Hyperlipidemia, unspecified: Secondary | ICD-10-CM | POA: Diagnosis not present

## 2017-12-17 DIAGNOSIS — Z87442 Personal history of urinary calculi: Secondary | ICD-10-CM | POA: Insufficient documentation

## 2017-12-17 DIAGNOSIS — I129 Hypertensive chronic kidney disease with stage 1 through stage 4 chronic kidney disease, or unspecified chronic kidney disease: Secondary | ICD-10-CM | POA: Insufficient documentation

## 2017-12-17 NOTE — Progress Notes (Signed)
Radiation Oncology         [phone] ________________________________  Initial outpatient Consultation  Name: Andrew Joyce MRN: [id number]  Date of Service: 12/17/2017 DOB: [birth year]  NW:GNFAOZHY, Delphia Grates, NP  Collier Bullock, MD   REFERRING PHYSICIAN: Collier Bullock, MD  DIAGNOSIS:  57 y/o male with stage IVa High Grade, T2 urothelial carcinoma of the bladder with sarcomatoid features, locally advancing with suprapubic skin erosion despite systemic treatment.     ICD-10-CM   1. Malignant neoplasm of urinary bladder, unspecified site [facility]) C67.9     HISTORY OF PRESENT ILLNESS: Andrew Joyce is a 57 y.o. male seen at the request of Dr. Ali Lowe at [facility]. He has a history of congenital bladder exstrophy and was diagnosed with Stage IVa High Grade, T2 urothelial carcinoma of the bladder with sarcomatoid features and pelvic adenopathy in July 2019. He has been followed by Dr. Vito Berger in medical oncology at [facility] as well as Dr. Alen Blew for local treatment. He was seen by Dr. Alen Blew and started on systemic therapy in September 2019 and completed 3 cycles of gemcitabine/cisplatin. Recent abdomen/pelvic CT on 12/01/17 showed POD with interval enlargement of bladder mass involving lower rectus musculature, and expansion into subcutaneous and extraperitoneal fat within right hemipelvis and a new perirectal node. There was no change to the necrotic right external iliac node or left paraaortic nodes despite systemic treatment but no evidence of suspicious osseous lesions or thoracic metastasis. At his recent follow up visit 12/08/17, on exam, he was noted to have new extrusion of tumor through the skin on the lower abdomen.  Due to the evidence of significant disease progression despite systemic therapy, he was referred to Dr. Ali Lowe in radiation oncology on 12/08/17, who recommended an aggressive course of palliative radiotherapy, +/- radiosensitizing chemotherapy. The  patient expressed preference of treatment here in [place] and thus, was kindly referred today to discuss the potential role of palliative radiotherapy in the management of his disease.  He is accompanied today by his wife.   PREVIOUS RADIATION THERAPY: No  PAST MEDICAL HISTORY:  Past Medical History:  Diagnosis Date  . 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 Chronic kidney disease (CKD), stage II (mild)   . Congenital birth defect   . Coronary artery disease    a. s/p CABG in 2008; b.  s/p cath in 2011 for med rx.;  c.  NSTEMI (02/2011):  LHC (02/2011):  LAD occluded, distal LAD filled via left to left collaterals, distal CFX 40-50%, proximal RCA occluded (right to right collaterals), distal RCA occluded, S-RCA occluded proximally, S-OM1/OM2 occluded (new from 2011 - culprit), L-LAD ok, ant apical HK, EF 45-50% - med Rx.  [name] Dyslipidemia   . History of renal stone   . Hx of cardiovascular stress test    Nuclear (06/2009):  EF 71%, small area of infarct/ischemia in inferior wall-unchanged from prior  . Hx of echocardiogram    Echo (05/2013):  EF 55-60%, inf HK, mild AI, normal RVSF, RVSP 28 mmHg  . Hypertension   . Pancreatitis   . Recurrent UTI       PAST SURGICAL HISTORY: Past Surgical History:  Procedure Laterality Date  . CARDIAC CATHETERIZATION  07/28/2009   EF 60%; Grafts patent except for SVG to the AM and PD. Native RCA is occluded.  [name] CARDIAC CATHETERIZATION  03/20/2006   EF 60-65%  . CARDIOVASCULAR STRESS TEST  06/12/2009   EF 71%,  SMALL AREA OF INFARCT/ISCHEMIA IN INFERIOR WALL; FELT TO NOT BE CHANGED.  [name] CORONARY ARTERY BYPASS GRAFT  03/2006   LIMA GRAFT TO LAD, SAPHENOUS VEIN GRAFT TO THE ACUTE MARGINAL BRANCH THE RIGHT CORONARY, SAPHENOUS VEIN GRAFT TO THE PDA, SEQUENTIAL VEIN GRAFT TO THE FIRST AND SECOND OBTUSE MARGINAL VESSELS  . IR IMAGING GUIDED PORT INSERTION  09/19/2017  . KIDNEY SURGERY    . LEFT HEART CATHETERIZATION WITH CORONARY  ANGIOGRAM N/A 02/26/2011   Procedure: LEFT HEART CATHETERIZATION WITH CORONARY ANGIOGRAM;  Surgeon: Thayer Headings, MD;  Location: [facility] CATH LAB;  Service: Cardiovascular;  Laterality: N/A;    FAMILY HISTORY:  Family History  Problem Relation Age of Onset  . Hyperlipidemia Mother        Living, 32  . Hypertension Mother   . Hyperlipidemia Father        Living 53  . Hypertension Father   . Healthy Sister   . Healthy Brother     SOCIAL HISTORY:  Social History   Socioeconomic History  . Marital status: Married    Spouse name: Not on file  . Number of children: Not on file  . Years of education: Not on file  . Highest education level: Not on file  Occupational History  . Not on file  Social Needs  . Financial resource strain: Not on file  . Food insecurity:    Worry: Not on file    Inability: Not on file  . Transportation needs:    Medical: No    Non-medical: No  Tobacco Use  . Smoking status: Never Smoker  . Smokeless tobacco: Never Used  Substance and Sexual Activity  . Alcohol use: No    Alcohol/week: 0.0 standard drinks  . Drug use: No  . Sexual activity: Yes  Lifestyle  . Physical activity:    Days per week: Not on file    Minutes per session: Not on file  . Stress: Not on file  Relationships  . Social connections:    Talks on phone: Not on file    Gets together: Not on file    Attends religious service: Not on file    Active member of club or organization: Not on file    Attends meetings of clubs or organizations: Not on file    Relationship status: Not on file  . Intimate partner violence:    Fear of current or ex partner: Not on file    Emotionally abused: Not on file    Physically abused: Not on file    Forced sexual activity: Not on file  Other Topics Concern  . Not on file  Social History Narrative   Lives with wife and 2 children in a 2 story home.     Education: college.     Retired Physiological scientist.     ALLERGIES: Patient has no known  allergies.  MEDICATIONS:  Current Outpatient Medications  Medication Sig Dispense Refill  . allopurinol (ZYLOPRIM) 100 MG tablet Take 1 tablet (100 mg total) by mouth daily. 30 tablet 6  . amLODipine (NORVASC) 5 MG tablet Take 1 tablet (5 mg total) by mouth daily. 30 tablet 6  . amoxicillin (AMOXIL) 250 MG capsule Take 250 mg by mouth daily.  11  . baclofen (LIORESAL) 10 MG tablet Take 2 tablets (20 mg total) by mouth 3 (three) times daily. 180 tablet 11  . carbamazepine (TEGRETOL) 200 MG tablet TAKE 1 TABLET BY MOUTH TWICE DAILY 60 tablet 5  . DULoxetine (  CYMBALTA) 20 MG capsule Take 20 mg by mouth at bedtime. For Sleep Aide  2  . fenofibrate 160 MG tablet TAKE 1 TABLET BY MOUTH ONCE DAILY 90 tablet 1  . fentaNYL (DURAGESIC - DOSED MCG/HR) 12 MCG/HR Place onto the skin.    [name] glucose blood (ACCU-CHEK GUIDE) test strip Use to check blood sugars 2 times daily. Dx Code: E11.9 100 each 12  . isosorbide mononitrate (IMDUR) 60 MG 24 hr tablet TAKE 1 & 1/2 (ONE & ONE-HALF) TABLETS BY MOUTH ONCE DAILY 135 tablet 2  . levothyroxine (SYNTHROID, LEVOTHROID) 25 MCG tablet Take 1 tablet (25 mcg total) by mouth daily. 30 tablet 1  . lidocaine-prilocaine (EMLA) cream Apply 1 application topically as needed. 30 g 0  . losartan (COZAAR) 100 MG tablet TAKE 1 TABLET BY MOUTH ONCE DAILY 90 tablet 3  . meloxicam (MOBIC) 15 MG tablet Take 1 tablet (15 mg total) by mouth daily. 30 tablet 1  . methenamine (MANDELAMINE) 1 g tablet Take 1,000 mg by mouth 3 (three) times daily.     . metoprolol tartrate (LOPRESSOR) 50 MG tablet Take 1 tablet (50 mg total) by mouth 2 (two) times daily. 180 tablet 1  . nitroGLYCERIN (NITROSTAT) 0.4 MG SL tablet Place 1 tablet (0.4 mg total) under the tongue every 5 (five) minutes as needed. For chest pain 25 tablet 3  . Oxycodone HCl 10 MG TABS Take by mouth.    . prochlorperazine (COMPAZINE) 10 MG tablet TAKE 1 TABLET BY MOUTH EVERY 6 HOURS AS NEEDED FOR NAUSEA FOR VOMITING 30 tablet 0    . rOPINIRole (REQUIP) 0.25 MG tablet TAKE 1 TABLET BY MOUTH 3 HOURS BEFORE BEDTIME 30 tablet 5  . rosuvastatin (CRESTOR) 40 MG tablet Take 40 mg by mouth daily.  6  . sitaGLIPtin (JANUVIA) 50 MG tablet Take 1 tablet (50 mg total) by mouth daily. 90 tablet 0  . sodium chloride flush 0.9 % SOLN injection Flush 10 mL of saline into each nephrostomy tube daily.    . sodium chloride flush 0.9 % SOLN injection 10 mL by Intra-cannular route two (2) times a day.    . traMADol (ULTRAM) 50 MG tablet Take 50 mg by mouth every 6 (six) hours as needed. for pain  1  . traZODone (DESYREL) 50 MG tablet TAKE 1/2 TO 1 (ONE-HALF TO ONE) TABLET BY MOUTH AT BEDTIME AS NEEDED FOR SLEEP 30 tablet 0  . rosuvastatin (CRESTOR) 40 MG tablet Take 1 tablet (40 mg total) by mouth daily. 30 tablet 6   No current facility-administered medications for this encounter.     REVIEW OF SYSTEMS:  On review of systems, the patient reports that he is doing well overall. He denies any chest pain, shortness of breath, cough, fevers, chills, night sweats, unintended weight changes. He denies any bowel or bladder disturbances, and denies abdominal pain, nausea or vomiting. He denies any new musculoskeletal or joint aches or pains. A complete review of systems is obtained and is otherwise negative.    PHYSICAL EXAM:  Wt Readings from Last 3 Encounters:  12/17/17 146 lb 12.8 oz (66.6 kg)  12/17/17 146 lb 12.8 oz (66.6 kg)  10/28/17 154 lb (69.9 kg)   Temp Readings from Last 3 Encounters:  12/17/17 99.5 F (37.5 C) (Oral)  12/17/17 99.5 F (37.5 C) (Oral)  11/04/17 98 F (36.7 C) (Oral)   BP Readings from Last 3 Encounters:  12/17/17 121/60  12/17/17 121/60  11/04/17 126/61   Pulse Readings  from Last 3 Encounters:  12/17/17 87  12/17/17 87  11/04/17 (!) 53   Pain Assessment Pain Score: 3  Pain Loc: (kidney's and bladder area)/10  In general this is a well appearing Caucasian gentleman in no acute distress. He is alert  and oriented x4 and appropriate throughout the examination. HEENT reveals that the patient is normocephalic, atraumatic. EOMs are intact. PERRLA. Skin is intact without any evidence of gross lesions. Cardiovascular exam reveals a regular rate and rhythm, no clicks rubs or murmurs are auscultated. Chest is clear to auscultation bilaterally. Lymphatic assessment is performed and does not reveal any adenopathy in the cervical, supraclavicular, axillary, or inguinal chains. Abdomen has active bowel sounds in all quadrants and is intact. The abdomen is soft, non tender, non distended. There is obvious tumor protrusion through the skin on the lower abdomen without evidence of infection or active bleeding. Lower extremities are negative for pretibial pitting edema, deep calf tenderness, cyanosis or clubbing.   KPS = 80  100 - Normal; no complaints; no evidence of disease. 90   - Able to carry on normal activity; minor signs or symptoms of disease. 80   - Normal activity with effort; some signs or symptoms of disease. 68   - Cares for self; unable to carry on normal activity or to do active work. 60   - Requires occasional assistance, but is able to care for most of his personal needs. 50   - Requires considerable assistance and frequent medical care. 101   - Disabled; requires special care and assistance. 22   - Severely disabled; hospital admission is indicated although death not imminent. 71   - Very sick; hospital admission necessary; active supportive treatment necessary. 10   - Moribund; fatal processes progressing rapidly. 0     - Dead  Karnofsky DA, Abelmann [place], Craver LS and Burchenal [facility] [phone]) The use of the nitrogen mustards in the palliative treatment of carcinoma: with particular reference to bronchogenic carcinoma Cancer 1 634-56  LABORATORY DATA:  Lab Results  Component Value Date   WBC 3.4 (L) 11/04/2017   HGB 7.0 (LL) 11/04/2017   HCT 21.7 (L) 11/04/2017   MCV 90.4 11/04/2017   PLT  336 11/04/2017   Lab Results  Component Value Date   NA 138 11/04/2017   K 4.7 11/04/2017   CL 103 11/04/2017   CO2 26 11/04/2017   Lab Results  Component Value Date   ALT 28 11/04/2017   AST 31 11/04/2017   ALKPHOS 64 11/04/2017   BILITOT 0.3 11/04/2017     RADIOGRAPHY: No results found.    IMPRESSION/PLAN: 1. 57 y.o. male with stage IVa, High Grade, T2 urothelial carcinoma of the bladder with sarcomatoid features, locally advancing despite systemic treatment. Today, we talked to the patient and his wife about the findings and workup thus far. We discussed the natural history of stage IV bladder cancer and general treatment, highlighting the role of radiotherapy in the management. We discussed the available radiation techniques, and focused on the details of logistics and delivery.  The recommendation is to proceed with a 5 to 6-1/2-week course of palliative radiotherapy, length of treatment depending on the recommendation for use of radiosensitizing chemotherapy.  We will defer to the decision regarding use of radiosensitizing chemotherapy to Dr. Alen Blew.  The patient understands that if the decision is to proceed with concurrent chemoradiation, we would anticipate a longer course of treatment with up to 35 fractions to improve his treatment tolerability.  We reviewed the anticipated acute and late sequelae associated with radiation in this setting. The patient was encouraged to ask questions that were answered to his satisfaction.   At the conclusion of our conversation, the patient elects to proceed with palliative radiotherapy and is tentatively scheduled for CT simulation on 12/22/17 at 9 am in anticipation of beginning treatments around 12/29/2017.  He has freely signed written consent to proceed today in the office and a copy of this document has been placed in his medical record.  We will reach out to Dr. Alen Blew to ascertain his opinion as to whether to proceed with concurrent  chemoradiation versus palliative radiotherapy alone and will proceed with treatment planning accordingly.     Nicholos Johns, PA-C    Tyler Pita, MD  [facility] Direct Dial: [phone]  Fax: [phone] [email or web address]  Skype  [email or web address]   This document serves as a record of services personally performed by Tyler Pita, MD and Freeman Caldron, PA-C. It was created on their behalf by Wilburn Mylar, a trained medical scribe. The creation of this record is based on the scribe's personal observations and the provider's statements to them. This document has been checked and approved by the attending provider.

## 2017-12-17 NOTE — Telephone Encounter (Signed)
Scheduled appt per 12/11 sch message.  Pt is aware of appt .

## 2017-12-19 ENCOUNTER — Telehealth: Payer: Self-pay | Admitting: Urology

## 2017-12-19 NOTE — Telephone Encounter (Signed)
I called and spoke with the patient to advise him that after thoughtful review and discussion with Dr. Alen Blew regarding the potential for moving forward with concurrent chemoradiation versus radiation alone, the recommendation is to move forward with a 5-week course of radiation alone followed by additional systemic therapy, either in the form of immunotherapy or other systemic treatments.  He anticipates a follow-up visit with Dr. Birdena Jubilee at [facility] following the completion of radiotherapy to discuss further treatment options at that time.  We will share this information with his treatment team at [facility] and move forward with treatment planning as scheduled with CT simulation on Monday, 12/22/2017.  He is comfortable with and in agreement with the stated plan.  Nicholos Johns, MMS, PA-C [facility] [place]: [phone]  Fax: [phone]

## 2017-12-22 ENCOUNTER — Inpatient Hospital Stay: Payer: Medicare Other | Attending: Oncology

## 2017-12-22 ENCOUNTER — Ambulatory Visit
Admission: RE | Admit: 2017-12-22 | Discharge: 2017-12-22 | Disposition: A | Discharge status: Home / Self Care | Payer: Medicare Other | Source: Ambulatory Visit | Attending: Radiation Oncology | Admitting: Radiation Oncology

## 2017-12-22 VITALS — BP 106/58 | HR 81 | Temp 99.5°F | Resp 17

## 2017-12-22 DIAGNOSIS — Z452 Encounter for adjustment and management of vascular access device: Secondary | ICD-10-CM | POA: Diagnosis not present

## 2017-12-22 DIAGNOSIS — C678 Malignant neoplasm of overlapping sites of bladder: Secondary | ICD-10-CM | POA: Diagnosis not present

## 2017-12-22 DIAGNOSIS — Z95828 Presence of other vascular implants and grafts: Secondary | ICD-10-CM

## 2017-12-22 DIAGNOSIS — Q6410 Exstrophy of urinary bladder, unspecified: Secondary | ICD-10-CM | POA: Diagnosis not present

## 2017-12-22 DIAGNOSIS — C775 Secondary and unspecified malignant neoplasm of intrapelvic lymph nodes: Secondary | ICD-10-CM | POA: Insufficient documentation

## 2017-12-22 DIAGNOSIS — C672 Malignant neoplasm of lateral wall of bladder: Secondary | ICD-10-CM

## 2017-12-22 DIAGNOSIS — I2510 Atherosclerotic heart disease of native coronary artery without angina pectoris: Secondary | ICD-10-CM | POA: Diagnosis not present

## 2017-12-22 DIAGNOSIS — N182 Chronic kidney disease, stage 2 (mild): Secondary | ICD-10-CM | POA: Diagnosis not present

## 2017-12-22 DIAGNOSIS — I129 Hypertensive chronic kidney disease with stage 1 through stage 4 chronic kidney disease, or unspecified chronic kidney disease: Secondary | ICD-10-CM | POA: Diagnosis not present

## 2017-12-22 DIAGNOSIS — C679 Malignant neoplasm of bladder, unspecified: Secondary | ICD-10-CM | POA: Diagnosis not present

## 2017-12-22 DIAGNOSIS — Z510 Encounter for antineoplastic radiation therapy: Secondary | ICD-10-CM | POA: Diagnosis not present

## 2017-12-22 MED ORDER — SODIUM CHLORIDE 0.9% FLUSH
10.0000 mL | Freq: Once | INTRAVENOUS | Status: AC
  Administered 2017-12-22: 10 mL
  Filled 2017-12-22: qty 10

## 2017-12-22 MED ORDER — HEPARIN SOD (PORK) LOCK FLUSH 100 UNIT/ML IV SOLN
500.0000 [IU] | Freq: Once | INTRAVENOUS | Status: AC
  Administered 2017-12-22: 500 [IU]
  Filled 2017-12-22: qty 5

## 2017-12-22 NOTE — Patient Instructions (Signed)
Implanted Port Home Guide An implanted port is a type of central line that is placed under the skin. Central lines are used to provide IV access when treatment or nutrition needs to be given through a person's veins. Implanted ports are used for long-term IV access. An implanted port may be placed because:  You need IV medicine that would be irritating to the small veins in your hands or arms.  You need long-term IV medicines, such as antibiotics.  You need IV nutrition for a long period.  You need frequent blood draws for lab tests.  You need dialysis.  Implanted ports are usually placed in the chest area, but they can also be placed in the upper arm, the abdomen, or the leg. An implanted port has two main parts:  Reservoir. The reservoir is round and will appear as a small, raised area under your skin. The reservoir is the part where a needle is inserted to give medicines or draw blood.  Catheter. The catheter is a thin, flexible tube that extends from the reservoir. The catheter is placed into a large vein. Medicine that is inserted into the reservoir goes into the catheter and then into the vein.  How will I care for my incision site? Do not get the incision site wet. Bathe or shower as directed by your health care provider. How is my port accessed? Special steps must be taken to access the port:  Before the port is accessed, a numbing cream can be placed on the skin. This helps numb the skin over the port site.  Your health care provider uses a sterile technique to access the port. ? Your health care provider must put on a mask and sterile gloves. ? The skin over your port is cleaned carefully with an antiseptic and allowed to dry. ? The port is gently pinched between sterile gloves, and a needle is inserted into the port.  Only "non-coring" port needles should be used to access the port. Once the port is accessed, a blood return should be checked. This helps ensure that the port  is in the vein and is not clogged.  If your port needs to remain accessed for a constant infusion, a clear (transparent) bandage will be placed over the needle site. The bandage and needle will need to be changed every week, or as directed by your health care provider.  Keep the bandage covering the needle clean and dry. Do not get it wet. Follow your health care provider's instructions on how to take a shower or bath while the port is accessed.  If your port does not need to stay accessed, no bandage is needed over the port.  What is flushing? Flushing helps keep the port from getting clogged. Follow your health care provider's instructions on how and when to flush the port. Ports are usually flushed with saline solution or a medicine called heparin. The need for flushing will depend on how the port is used.  If the port is used for intermittent medicines or blood draws, the port will need to be flushed: ? After medicines have been given. ? After blood has been drawn. ? As part of routine maintenance.  If a constant infusion is running, the port may not need to be flushed.  How long will my port stay implanted? The port can stay in for as long as your health care provider thinks it is needed. When it is time for the port to come out, surgery will be   done to remove it. The procedure is similar to the one performed when the port was put in. When should I seek immediate medical care? When you have an implanted port, you should seek immediate medical care if:  You notice a bad smell coming from the incision site.  You have swelling, redness, or drainage at the incision site.  You have more swelling or pain at the port site or the surrounding area.  You have a fever that is not controlled with medicine.  This information is not intended to replace advice given to you by your health care provider. Make sure you discuss any questions you have with your health care provider. Document  Released: 12/24/2004 Document Revised: 06/01/2015 Document Reviewed: 08/31/2012 Elsevier Interactive Patient Education  2017 Elsevier Inc.  

## 2017-12-22 NOTE — Progress Notes (Signed)
  Radiation Oncology         [phone]) [phone] ________________________________  Name: Andrew Joyce MRN: [id number]  Date: 12/22/2017  DOB: 1960-03-17  SIMULATION AND TREATMENT PLANNING NOTE    ICD-10-CM   1. Malignant neoplasm of lateral wall of urinary bladder (HCC) C67.2     DIAGNOSIS:  57 y.o. male with stage IVa High Grade, T2 urothelial carcinoma of the bladder with sarcomatoid features, locally advancing with suprapubic skin erosion despite systemic treatment.  NARRATIVE:  The patient was brought to the [facility].  Identity was confirmed.  All relevant records and images related to the planned course of therapy were reviewed.  The patient freely provided informed written consent to proceed with treatment after reviewing the details related to the planned course of therapy. The consent form was witnessed and verified by the simulation staff.  Then, the patient was set-up in a stable reproducible  supine position for radiation therapy.  Contrast was instilled into the bladder with a catheter under sterile conditions.  CT images were obtained.  Surface markings were placed.  The CT images were loaded into the planning software.  Then the target and avoidance structures were contoured.  Treatment planning then occurred.  The radiation prescription was entered and confirmed.  Then, I designed and supervised the construction of a total of 5 medically necessary complex treatment devices including VacLoc body positioner and 4 MLCs to shield the bowel and femoral necks.  I have requested : 3D Simulation  I have requested a DVH of the following structures: small bowel, rectum, left femoral head, right femoral head and targets.  PLAN:  The patient will receive 45 Gy in 25 fractions to the bladder and pelvic nodes, followed by a boost to the bladder tumor and grossly enlarged right external iliac node to a total dose of 64.8 Gy.  ________________________________  Sheral Apley  Tammi Klippel, M.D.  This document serves as a record of services personally performed by Tyler Pita, MD. It was created on his behalf by Wilburn Mylar, a trained medical scribe. The creation of this record is based on the scribe's personal observations and the provider's statements to them. This document has been checked and approved by the attending provider.

## 2017-12-24 DIAGNOSIS — C679 Malignant neoplasm of bladder, unspecified: Secondary | ICD-10-CM | POA: Diagnosis not present

## 2017-12-24 DIAGNOSIS — I129 Hypertensive chronic kidney disease with stage 1 through stage 4 chronic kidney disease, or unspecified chronic kidney disease: Secondary | ICD-10-CM | POA: Diagnosis not present

## 2017-12-24 DIAGNOSIS — Z510 Encounter for antineoplastic radiation therapy: Secondary | ICD-10-CM | POA: Diagnosis not present

## 2017-12-24 DIAGNOSIS — C678 Malignant neoplasm of overlapping sites of bladder: Secondary | ICD-10-CM | POA: Diagnosis not present

## 2017-12-24 DIAGNOSIS — N182 Chronic kidney disease, stage 2 (mild): Secondary | ICD-10-CM | POA: Diagnosis not present

## 2017-12-24 DIAGNOSIS — Q6410 Exstrophy of urinary bladder, unspecified: Secondary | ICD-10-CM | POA: Diagnosis not present

## 2017-12-24 DIAGNOSIS — I2510 Atherosclerotic heart disease of native coronary artery without angina pectoris: Secondary | ICD-10-CM | POA: Diagnosis not present

## 2018-01-01 ENCOUNTER — Ambulatory Visit
Admission: RE | Admit: 2018-01-01 | Discharge: 2018-01-01 | Disposition: A | Discharge status: Home / Self Care | Payer: Medicare Other | Source: Ambulatory Visit | Attending: Radiation Oncology | Admitting: Radiation Oncology

## 2018-01-01 DIAGNOSIS — C678 Malignant neoplasm of overlapping sites of bladder: Secondary | ICD-10-CM | POA: Diagnosis not present

## 2018-01-01 DIAGNOSIS — I2510 Atherosclerotic heart disease of native coronary artery without angina pectoris: Secondary | ICD-10-CM | POA: Diagnosis not present

## 2018-01-01 DIAGNOSIS — C679 Malignant neoplasm of bladder, unspecified: Secondary | ICD-10-CM | POA: Diagnosis not present

## 2018-01-01 DIAGNOSIS — N182 Chronic kidney disease, stage 2 (mild): Secondary | ICD-10-CM | POA: Diagnosis not present

## 2018-01-01 DIAGNOSIS — Q6410 Exstrophy of urinary bladder, unspecified: Secondary | ICD-10-CM | POA: Diagnosis not present

## 2018-01-01 DIAGNOSIS — I129 Hypertensive chronic kidney disease with stage 1 through stage 4 chronic kidney disease, or unspecified chronic kidney disease: Secondary | ICD-10-CM | POA: Diagnosis not present

## 2018-01-01 DIAGNOSIS — Z510 Encounter for antineoplastic radiation therapy: Secondary | ICD-10-CM | POA: Diagnosis not present

## 2018-01-02 ENCOUNTER — Ambulatory Visit
Admission: RE | Admit: 2018-01-02 | Discharge: 2018-01-02 | Disposition: A | Discharge status: Home / Self Care | Payer: Medicare Other | Source: Ambulatory Visit | Attending: Radiation Oncology | Admitting: Radiation Oncology

## 2018-01-02 DIAGNOSIS — Q6410 Exstrophy of urinary bladder, unspecified: Secondary | ICD-10-CM | POA: Diagnosis not present

## 2018-01-02 DIAGNOSIS — I129 Hypertensive chronic kidney disease with stage 1 through stage 4 chronic kidney disease, or unspecified chronic kidney disease: Secondary | ICD-10-CM | POA: Diagnosis not present

## 2018-01-02 DIAGNOSIS — Z510 Encounter for antineoplastic radiation therapy: Secondary | ICD-10-CM | POA: Diagnosis not present

## 2018-01-02 DIAGNOSIS — N182 Chronic kidney disease, stage 2 (mild): Secondary | ICD-10-CM | POA: Diagnosis not present

## 2018-01-02 DIAGNOSIS — C679 Malignant neoplasm of bladder, unspecified: Secondary | ICD-10-CM | POA: Diagnosis not present

## 2018-01-02 DIAGNOSIS — C678 Malignant neoplasm of overlapping sites of bladder: Secondary | ICD-10-CM | POA: Diagnosis not present

## 2018-01-02 DIAGNOSIS — I2510 Atherosclerotic heart disease of native coronary artery without angina pectoris: Secondary | ICD-10-CM | POA: Diagnosis not present

## 2018-01-05 ENCOUNTER — Ambulatory Visit: Payer: Medicare Other

## 2018-01-06 ENCOUNTER — Other Ambulatory Visit: Payer: Self-pay | Admitting: Radiation Oncology

## 2018-01-06 ENCOUNTER — Ambulatory Visit
Admission: RE | Admit: 2018-01-06 | Discharge: 2018-01-06 | Disposition: A | Discharge status: Home / Self Care | Payer: Medicare Other | Source: Ambulatory Visit | Attending: Radiation Oncology | Admitting: Radiation Oncology

## 2018-01-06 DIAGNOSIS — Q6410 Exstrophy of urinary bladder, unspecified: Secondary | ICD-10-CM | POA: Diagnosis not present

## 2018-01-06 DIAGNOSIS — I2510 Atherosclerotic heart disease of native coronary artery without angina pectoris: Secondary | ICD-10-CM | POA: Diagnosis not present

## 2018-01-06 DIAGNOSIS — C679 Malignant neoplasm of bladder, unspecified: Secondary | ICD-10-CM | POA: Diagnosis not present

## 2018-01-06 DIAGNOSIS — Z510 Encounter for antineoplastic radiation therapy: Secondary | ICD-10-CM | POA: Diagnosis not present

## 2018-01-06 DIAGNOSIS — N182 Chronic kidney disease, stage 2 (mild): Secondary | ICD-10-CM | POA: Diagnosis not present

## 2018-01-06 DIAGNOSIS — C678 Malignant neoplasm of overlapping sites of bladder: Secondary | ICD-10-CM | POA: Diagnosis not present

## 2018-01-06 DIAGNOSIS — I129 Hypertensive chronic kidney disease with stage 1 through stage 4 chronic kidney disease, or unspecified chronic kidney disease: Secondary | ICD-10-CM | POA: Diagnosis not present

## 2018-01-06 MED ORDER — PROCHLORPERAZINE MALEATE 10 MG PO TABS: ORAL_TABLET | ORAL | 0 refills | Status: DC

## 2018-01-08 ENCOUNTER — Ambulatory Visit
Admission: RE | Admit: 2018-01-08 | Discharge: 2018-01-08 | Disposition: A | Discharge status: Home / Self Care | Payer: Medicare Other | Source: Ambulatory Visit | Attending: Radiation Oncology | Admitting: Radiation Oncology

## 2018-01-08 DIAGNOSIS — Q6410 Exstrophy of urinary bladder, unspecified: Secondary | ICD-10-CM | POA: Insufficient documentation

## 2018-01-08 DIAGNOSIS — C679 Malignant neoplasm of bladder, unspecified: Secondary | ICD-10-CM | POA: Diagnosis not present

## 2018-01-08 DIAGNOSIS — N182 Chronic kidney disease, stage 2 (mild): Secondary | ICD-10-CM | POA: Diagnosis not present

## 2018-01-08 DIAGNOSIS — I129 Hypertensive chronic kidney disease with stage 1 through stage 4 chronic kidney disease, or unspecified chronic kidney disease: Secondary | ICD-10-CM | POA: Diagnosis not present

## 2018-01-08 DIAGNOSIS — Z510 Encounter for antineoplastic radiation therapy: Secondary | ICD-10-CM | POA: Diagnosis not present

## 2018-01-08 DIAGNOSIS — Z87442 Personal history of urinary calculi: Secondary | ICD-10-CM | POA: Diagnosis not present

## 2018-01-08 DIAGNOSIS — I2510 Atherosclerotic heart disease of native coronary artery without angina pectoris: Secondary | ICD-10-CM | POA: Insufficient documentation

## 2018-01-08 DIAGNOSIS — Z8719 Personal history of other diseases of the digestive system: Secondary | ICD-10-CM | POA: Insufficient documentation

## 2018-01-08 DIAGNOSIS — Z79899 Other long term (current) drug therapy: Secondary | ICD-10-CM | POA: Diagnosis not present

## 2018-01-08 DIAGNOSIS — E785 Hyperlipidemia, unspecified: Secondary | ICD-10-CM | POA: Insufficient documentation

## 2018-01-08 DIAGNOSIS — C678 Malignant neoplasm of overlapping sites of bladder: Secondary | ICD-10-CM | POA: Diagnosis not present

## 2018-01-08 NOTE — Progress Notes (Deleted)
Cardiology Office Note   Date:  01/08/2018   ID:  Andrew Joyce, DOB July 21, 1960, MRN [id number]  PCP:  [name], NP  Cardiologist:  Dr. Baby Stairs [place]      History of Present Illness: Andrew Joyce is a 58 y.o. male seen for follow up CAD. He has a hx of CAD s/p CABG in 2008, HTN, HL, CKD.  He suffered a non-STEMI in February 2013. Culprit was felt to be an occluded SVG-OM1/OM2. Native circumflex had 40-50% distal stenosis. There were no targets for revascularization and he was treated medically.   He has a history orthostatic intolerance. Imdur dose was reduced.  Echo May 2015 demonstrated EF 55-60%, mild inferior hypokinesis, top normal LA/RA size.Poorly visualized aortic valve, however, there is mild central AI - no stenosis.    He has recurrent UTIs and renal stones followed by urology. He has frequent calculi passing multiple stones a month. This has improved with something to increase his citrate level.  He underwent cystourethroscopy with ureteral stenting and resection of a bladder tumor on his last visit.  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 He was seen this past month by Radiation oncology and is starting palliative radiation therapy.   Recent Labs: 05/05/2017: TSH 2.35 07/24/2017: HDL 33; LDL Calculated 77 11/04/2017: ALT 28; Creatinine 2.03; Hemoglobin 7.0; Potassium 4.7  Wt Readings from Last 3 Encounters:  12/17/17 146 lb 12.8 oz (66.6 kg)  12/17/17 146 lb 12.8 oz (66.6 kg)  10/28/17 154 lb (69.9 kg)     Past Medical History:  Diagnosis Date  . 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 Chronic kidney disease (CKD), stage II (mild)   . Congenital birth defect   . Coronary artery disease    a. s/p CABG in 2008; b.  s/p cath in 2011 for med rx.;  c.  NSTEMI (02/2011):  LHC (02/2011):  LAD  occluded, distal LAD filled via left to left collaterals, distal CFX 40-50%, proximal RCA occluded (right to right collaterals), distal RCA occluded, S-RCA occluded proximally, S-OM1/OM2 occluded (new from 2011 - culprit), L-LAD ok, ant apical HK, EF 45-50% - med Rx.  [name] Dyslipidemia   . History of renal stone   . Hx of cardiovascular stress test    Nuclear (06/2009):  EF 71%, small area of infarct/ischemia in inferior wall-unchanged from prior  . Hx of echocardiogram    Echo (05/2013):  EF 55-60%, inf HK, mild AI, normal RVSF, RVSP 28 mmHg  . Hypertension   . Pancreatitis   . Recurrent UTI     Current Outpatient Medications  Medication Sig Dispense Refill  . allopurinol (ZYLOPRIM) 100 MG tablet Take 1 tablet (100 mg total) by mouth daily. 30 tablet 6  . amLODipine (NORVASC) 5 MG tablet Take 1 tablet (5 mg total) by mouth daily. 30 tablet 6  . amoxicillin (AMOXIL) 250 MG capsule Take 250 mg by mouth daily.  11  . baclofen (LIORESAL) 10 MG tablet Take 2 tablets (20 mg total) by mouth 3 (three) times daily. 180 tablet 11  . carbamazepine (TEGRETOL) 200 MG tablet TAKE 1 TABLET BY MOUTH TWICE DAILY 60 tablet 5  . DULoxetine (CYMBALTA) 20 MG capsule Take 20 mg by mouth at bedtime. For Sleep Aide  2  . fenofibrate 160 MG tablet TAKE 1 TABLET BY MOUTH ONCE DAILY 90  tablet 1  . fentaNYL (DURAGESIC - DOSED MCG/HR) 12 MCG/HR Place onto the skin.    [name] glucose blood (ACCU-CHEK GUIDE) test strip Use to check blood sugars 2 times daily. Dx Code: E11.9 100 each 12  . isosorbide mononitrate (IMDUR) 60 MG 24 hr tablet TAKE 1 & 1/2 (ONE & ONE-HALF) TABLETS BY MOUTH ONCE DAILY 135 tablet 2  . levothyroxine (SYNTHROID, LEVOTHROID) 25 MCG tablet Take 1 tablet (25 mcg total) by mouth daily. 30 tablet 1  . lidocaine-prilocaine (EMLA) cream Apply 1 application topically as needed. 30 g 0  . losartan (COZAAR) 100 MG tablet TAKE 1 TABLET BY MOUTH ONCE DAILY 90 tablet 3  . meloxicam (MOBIC) 15 MG tablet Take 1 tablet  (15 mg total) by mouth daily. 30 tablet 1  . methenamine (MANDELAMINE) 1 g tablet Take 1,000 mg by mouth 3 (three) times daily.     . metoprolol tartrate (LOPRESSOR) 50 MG tablet Take 1 tablet (50 mg total) by mouth 2 (two) times daily. 180 tablet 1  . nitroGLYCERIN (NITROSTAT) 0.4 MG SL tablet Place 1 tablet (0.4 mg total) under the tongue every 5 (five) minutes as needed. For chest pain 25 tablet 3  . Oxycodone HCl 10 MG TABS Take by mouth.    . prochlorperazine (COMPAZINE) 10 MG tablet TAKE 1 TABLET BY MOUTH EVERY 6 HOURS AS NEEDED FOR NAUSEA FOR VOMITING 30 tablet 0  . rOPINIRole (REQUIP) 0.25 MG tablet TAKE 1 TABLET BY MOUTH 3 HOURS BEFORE BEDTIME 30 tablet 5  . rosuvastatin (CRESTOR) 40 MG tablet Take 1 tablet (40 mg total) by mouth daily. 30 tablet 6  . rosuvastatin (CRESTOR) 40 MG tablet Take 40 mg by mouth daily.  6  . sitaGLIPtin (JANUVIA) 50 MG tablet Take 1 tablet (50 mg total) by mouth daily. 90 tablet 0  . sodium chloride flush 0.9 % SOLN injection Flush 10 mL of saline into each nephrostomy tube daily.    . sodium chloride flush 0.9 % SOLN injection 10 mL by Intra-cannular route two (2) times a day.    . traMADol (ULTRAM) 50 MG tablet Take 50 mg by mouth every 6 (six) hours as needed. for pain  1  . traZODone (DESYREL) 50 MG tablet TAKE 1/2 TO 1 (ONE-HALF TO ONE) TABLET BY MOUTH AT BEDTIME AS NEEDED FOR SLEEP 30 tablet 0   No current facility-administered medications for this visit.     Allergies:   Patient has no known allergies.   Social History:  The patient  reports that he has never smoked. He has never used smokeless tobacco. He reports that he does not drink alcohol or use drugs.   Family History:  The patient's family history includes Healthy in his brother and sister; Hyperlipidemia in his father and mother; Hypertension in his father and mother.   ROS:  Please see the history of present illness.     All other systems reviewed and negative.   PHYSICAL EXAM: VS:   There were no vitals taken for this visit.  GENERAL:  Well appearing WM in NAD HEENT:  PERRL, EOMI, sclera are clear. Oropharynx is clear. NECK:  No jugular venous distention, carotid upstroke brisk and symmetric, no bruits, no thyromegaly or adenopathy LUNGS:  Clear to auscultation bilaterally CHEST:  Unremarkable HEART:  RRR,  PMI not displaced or sustained,S1 and S2 within normal limits, no S3, no S4: no clicks, no rubs, no murmurs ABD:  Soft, nontender. BS +, no masses or bruits. No hepatomegaly,  no splenomegaly EXT:  2 + pulses throughout, no edema, no cyanosis no clubbing SKIN:  Warm and dry.  No rashes NEURO:  Alert and oriented x 3. Cranial nerves II through XII intact. PSYCH:  Cognitively intact      Laboratory data:  Lab Results  Component Value Date   WBC 3.4 (L) 11/04/2017   HGB 7.0 (LL) 11/04/2017   HCT 21.7 (L) 11/04/2017   PLT 336 11/04/2017   GLUCOSE 189 (H) 11/04/2017   CHOL 148 07/24/2017   TRIG 188 (H) 07/24/2017   HDL 33 (L) 07/24/2017   LDLDIRECT 45 03/01/2015   LDLCALC 77 07/24/2017   ALT 28 11/04/2017   AST 31 11/04/2017   NA 138 11/04/2017   K 4.7 11/04/2017   CL 103 11/04/2017   CREATININE 2.03 (H) 11/04/2017   BUN 44 (H) 11/04/2017   CO2 26 11/04/2017   TSH 2.35 05/05/2017   INR 1.20 09/19/2017   HGBA1C 7.7 (H) 07/24/2017     ASSESSMENT AND PLAN:  1. Orthostatic intolerance: this has resolved with reduction in medication. 2. CAD (coronary artery disease): s/p remote CABG in 2008. Occluded SVG to OM1 and OM2 in 2013.  Stable angina pectoris class 1. Continue aspirin, Plavix, nitrates, beta blocker.  3. HTN (hypertension):  This is well controlled.  4. Hyperlipidemia- combined with severe hypertriglyceridemia. :   On pravastatin, fenofibrate, fish oil. Last lipid panel showed significant improvement in triglycerides to 262. Will update today 5. CKD (chronic kidney disease) stage 2, GFR 60-89 ml/min:   6. Renal calculi- recurrent 7. Elevated  blood glucose. Will repeat today and check an A1c.  I will follow up in 6 months.

## 2018-01-09 ENCOUNTER — Ambulatory Visit
Admission: RE | Admit: 2018-01-09 | Discharge: 2018-01-09 | Disposition: A | Discharge status: Home / Self Care | Payer: Medicare Other | Source: Ambulatory Visit | Attending: Radiation Oncology | Admitting: Radiation Oncology

## 2018-01-09 DIAGNOSIS — Z510 Encounter for antineoplastic radiation therapy: Secondary | ICD-10-CM | POA: Diagnosis not present

## 2018-01-09 DIAGNOSIS — I129 Hypertensive chronic kidney disease with stage 1 through stage 4 chronic kidney disease, or unspecified chronic kidney disease: Secondary | ICD-10-CM | POA: Diagnosis not present

## 2018-01-09 DIAGNOSIS — C679 Malignant neoplasm of bladder, unspecified: Secondary | ICD-10-CM | POA: Diagnosis not present

## 2018-01-09 DIAGNOSIS — C678 Malignant neoplasm of overlapping sites of bladder: Secondary | ICD-10-CM | POA: Diagnosis not present

## 2018-01-09 DIAGNOSIS — N182 Chronic kidney disease, stage 2 (mild): Secondary | ICD-10-CM | POA: Diagnosis not present

## 2018-01-09 DIAGNOSIS — I2510 Atherosclerotic heart disease of native coronary artery without angina pectoris: Secondary | ICD-10-CM | POA: Diagnosis not present

## 2018-01-09 DIAGNOSIS — Q6410 Exstrophy of urinary bladder, unspecified: Secondary | ICD-10-CM | POA: Diagnosis not present

## 2018-01-12 ENCOUNTER — Ambulatory Visit: Payer: Medicare Other | Admitting: Cardiology

## 2018-01-12 ENCOUNTER — Ambulatory Visit
Admission: RE | Admit: 2018-01-12 | Discharge: 2018-01-12 | Disposition: A | Discharge status: Home / Self Care | Payer: Medicare Other | Source: Ambulatory Visit | Attending: Radiation Oncology | Admitting: Radiation Oncology

## 2018-01-12 DIAGNOSIS — I2510 Atherosclerotic heart disease of native coronary artery without angina pectoris: Secondary | ICD-10-CM | POA: Diagnosis not present

## 2018-01-12 DIAGNOSIS — I129 Hypertensive chronic kidney disease with stage 1 through stage 4 chronic kidney disease, or unspecified chronic kidney disease: Secondary | ICD-10-CM | POA: Diagnosis not present

## 2018-01-12 DIAGNOSIS — C679 Malignant neoplasm of bladder, unspecified: Secondary | ICD-10-CM | POA: Diagnosis not present

## 2018-01-12 DIAGNOSIS — C678 Malignant neoplasm of overlapping sites of bladder: Secondary | ICD-10-CM | POA: Diagnosis not present

## 2018-01-12 DIAGNOSIS — N182 Chronic kidney disease, stage 2 (mild): Secondary | ICD-10-CM | POA: Diagnosis not present

## 2018-01-12 DIAGNOSIS — Z510 Encounter for antineoplastic radiation therapy: Secondary | ICD-10-CM | POA: Diagnosis not present

## 2018-01-12 DIAGNOSIS — Q6410 Exstrophy of urinary bladder, unspecified: Secondary | ICD-10-CM | POA: Diagnosis not present

## 2018-01-13 ENCOUNTER — Other Ambulatory Visit: Payer: Self-pay | Admitting: Nurse Practitioner

## 2018-01-13 ENCOUNTER — Ambulatory Visit
Admission: RE | Admit: 2018-01-13 | Discharge: 2018-01-13 | Disposition: A | Discharge status: Home / Self Care | Payer: Medicare Other | Source: Ambulatory Visit | Attending: Radiation Oncology | Admitting: Radiation Oncology

## 2018-01-13 DIAGNOSIS — C679 Malignant neoplasm of bladder, unspecified: Secondary | ICD-10-CM | POA: Diagnosis not present

## 2018-01-13 DIAGNOSIS — N182 Chronic kidney disease, stage 2 (mild): Secondary | ICD-10-CM | POA: Diagnosis not present

## 2018-01-13 DIAGNOSIS — Q6410 Exstrophy of urinary bladder, unspecified: Secondary | ICD-10-CM | POA: Diagnosis not present

## 2018-01-13 DIAGNOSIS — C678 Malignant neoplasm of overlapping sites of bladder: Secondary | ICD-10-CM | POA: Diagnosis not present

## 2018-01-13 DIAGNOSIS — E1129 Type 2 diabetes mellitus with other diabetic kidney complication: Secondary | ICD-10-CM

## 2018-01-13 DIAGNOSIS — I2510 Atherosclerotic heart disease of native coronary artery without angina pectoris: Secondary | ICD-10-CM | POA: Diagnosis not present

## 2018-01-13 DIAGNOSIS — Z510 Encounter for antineoplastic radiation therapy: Secondary | ICD-10-CM | POA: Diagnosis not present

## 2018-01-13 DIAGNOSIS — I129 Hypertensive chronic kidney disease with stage 1 through stage 4 chronic kidney disease, or unspecified chronic kidney disease: Secondary | ICD-10-CM | POA: Diagnosis not present

## 2018-01-14 ENCOUNTER — Ambulatory Visit: Payer: Medicare Other

## 2018-01-14 DIAGNOSIS — C679 Malignant neoplasm of bladder, unspecified: Secondary | ICD-10-CM | POA: Diagnosis not present

## 2018-01-14 DIAGNOSIS — Z436 Encounter for attention to other artificial openings of urinary tract: Secondary | ICD-10-CM | POA: Diagnosis not present

## 2018-01-14 DIAGNOSIS — T83092A Other mechanical complication of nephrostomy catheter, initial encounter: Secondary | ICD-10-CM | POA: Diagnosis not present

## 2018-01-15 ENCOUNTER — Other Ambulatory Visit: Payer: Self-pay | Admitting: Nurse Practitioner

## 2018-01-15 ENCOUNTER — Ambulatory Visit
Admission: RE | Admit: 2018-01-15 | Discharge: 2018-01-15 | Disposition: A | Discharge status: Home / Self Care | Payer: Medicare Other | Source: Ambulatory Visit | Attending: Radiation Oncology | Admitting: Radiation Oncology

## 2018-01-15 DIAGNOSIS — I129 Hypertensive chronic kidney disease with stage 1 through stage 4 chronic kidney disease, or unspecified chronic kidney disease: Secondary | ICD-10-CM | POA: Diagnosis not present

## 2018-01-15 DIAGNOSIS — N182 Chronic kidney disease, stage 2 (mild): Secondary | ICD-10-CM | POA: Diagnosis not present

## 2018-01-15 DIAGNOSIS — I2510 Atherosclerotic heart disease of native coronary artery without angina pectoris: Secondary | ICD-10-CM | POA: Diagnosis not present

## 2018-01-15 DIAGNOSIS — C678 Malignant neoplasm of overlapping sites of bladder: Secondary | ICD-10-CM | POA: Diagnosis not present

## 2018-01-15 DIAGNOSIS — G4700 Insomnia, unspecified: Secondary | ICD-10-CM

## 2018-01-15 DIAGNOSIS — C679 Malignant neoplasm of bladder, unspecified: Secondary | ICD-10-CM | POA: Diagnosis not present

## 2018-01-15 DIAGNOSIS — Q6410 Exstrophy of urinary bladder, unspecified: Secondary | ICD-10-CM | POA: Diagnosis not present

## 2018-01-15 DIAGNOSIS — Z510 Encounter for antineoplastic radiation therapy: Secondary | ICD-10-CM | POA: Diagnosis not present

## 2018-01-16 ENCOUNTER — Ambulatory Visit
Admission: RE | Admit: 2018-01-16 | Discharge: 2018-01-16 | Disposition: A | Discharge status: Home / Self Care | Payer: Medicare Other | Source: Ambulatory Visit | Attending: Radiation Oncology | Admitting: Radiation Oncology

## 2018-01-16 DIAGNOSIS — I129 Hypertensive chronic kidney disease with stage 1 through stage 4 chronic kidney disease, or unspecified chronic kidney disease: Secondary | ICD-10-CM | POA: Diagnosis not present

## 2018-01-16 DIAGNOSIS — N182 Chronic kidney disease, stage 2 (mild): Secondary | ICD-10-CM | POA: Diagnosis not present

## 2018-01-16 DIAGNOSIS — I2510 Atherosclerotic heart disease of native coronary artery without angina pectoris: Secondary | ICD-10-CM | POA: Diagnosis not present

## 2018-01-16 DIAGNOSIS — C679 Malignant neoplasm of bladder, unspecified: Secondary | ICD-10-CM | POA: Diagnosis not present

## 2018-01-16 DIAGNOSIS — Z510 Encounter for antineoplastic radiation therapy: Secondary | ICD-10-CM | POA: Diagnosis not present

## 2018-01-16 DIAGNOSIS — Q6410 Exstrophy of urinary bladder, unspecified: Secondary | ICD-10-CM | POA: Diagnosis not present

## 2018-01-16 DIAGNOSIS — C678 Malignant neoplasm of overlapping sites of bladder: Secondary | ICD-10-CM | POA: Diagnosis not present

## 2018-01-16 NOTE — Telephone Encounter (Signed)
Can we call him to find out how often and dose he is taking of trazodone as well as fentanyl and tramadol? It looks like he has started the fentanyl  And tramadol since last trazodone refill, and all of these medications combined together could cause increased risk for respiratory depression or other side effects. I want to see how much he is taking each medicine prior to further fills, so I can determine best plan for his trazodone.

## 2018-01-16 NOTE — Telephone Encounter (Signed)
Left mess for patient to call back. CRM created.  

## 2018-01-19 ENCOUNTER — Other Ambulatory Visit: Payer: Self-pay | Admitting: Urology

## 2018-01-19 ENCOUNTER — Ambulatory Visit
Admission: RE | Admit: 2018-01-19 | Discharge: 2018-01-19 | Disposition: A | Discharge status: Home / Self Care | Payer: Medicare Other | Source: Ambulatory Visit | Attending: Radiation Oncology | Admitting: Radiation Oncology

## 2018-01-19 DIAGNOSIS — Z510 Encounter for antineoplastic radiation therapy: Secondary | ICD-10-CM | POA: Diagnosis not present

## 2018-01-19 DIAGNOSIS — Q6410 Exstrophy of urinary bladder, unspecified: Secondary | ICD-10-CM | POA: Diagnosis not present

## 2018-01-19 DIAGNOSIS — C679 Malignant neoplasm of bladder, unspecified: Secondary | ICD-10-CM | POA: Diagnosis not present

## 2018-01-19 DIAGNOSIS — N182 Chronic kidney disease, stage 2 (mild): Secondary | ICD-10-CM | POA: Diagnosis not present

## 2018-01-19 DIAGNOSIS — C678 Malignant neoplasm of overlapping sites of bladder: Secondary | ICD-10-CM | POA: Diagnosis not present

## 2018-01-19 DIAGNOSIS — I129 Hypertensive chronic kidney disease with stage 1 through stage 4 chronic kidney disease, or unspecified chronic kidney disease: Secondary | ICD-10-CM | POA: Diagnosis not present

## 2018-01-19 DIAGNOSIS — I2510 Atherosclerotic heart disease of native coronary artery without angina pectoris: Secondary | ICD-10-CM | POA: Diagnosis not present

## 2018-01-19 NOTE — Telephone Encounter (Signed)
Called pt again- Left mess for patient to call back.  

## 2018-01-20 ENCOUNTER — Ambulatory Visit
Admission: RE | Admit: 2018-01-20 | Discharge: 2018-01-20 | Disposition: A | Discharge status: Home / Self Care | Payer: Medicare Other | Source: Ambulatory Visit | Attending: Radiation Oncology | Admitting: Radiation Oncology

## 2018-01-20 DIAGNOSIS — I129 Hypertensive chronic kidney disease with stage 1 through stage 4 chronic kidney disease, or unspecified chronic kidney disease: Secondary | ICD-10-CM | POA: Diagnosis not present

## 2018-01-20 DIAGNOSIS — I2510 Atherosclerotic heart disease of native coronary artery without angina pectoris: Secondary | ICD-10-CM | POA: Diagnosis not present

## 2018-01-20 DIAGNOSIS — N182 Chronic kidney disease, stage 2 (mild): Secondary | ICD-10-CM | POA: Diagnosis not present

## 2018-01-20 DIAGNOSIS — Q6410 Exstrophy of urinary bladder, unspecified: Secondary | ICD-10-CM | POA: Diagnosis not present

## 2018-01-20 DIAGNOSIS — Z510 Encounter for antineoplastic radiation therapy: Secondary | ICD-10-CM | POA: Diagnosis not present

## 2018-01-20 DIAGNOSIS — C679 Malignant neoplasm of bladder, unspecified: Secondary | ICD-10-CM | POA: Diagnosis not present

## 2018-01-20 DIAGNOSIS — C678 Malignant neoplasm of overlapping sites of bladder: Secondary | ICD-10-CM | POA: Diagnosis not present

## 2018-01-21 ENCOUNTER — Ambulatory Visit: Payer: Medicare Other

## 2018-01-22 ENCOUNTER — Ambulatory Visit
Admission: RE | Admit: 2018-01-22 | Discharge: 2018-01-22 | Disposition: A | Discharge status: Home / Self Care | Payer: Medicare Other | Source: Ambulatory Visit | Attending: Radiation Oncology | Admitting: Radiation Oncology

## 2018-01-22 ENCOUNTER — Telehealth: Payer: Self-pay | Admitting: *Deleted

## 2018-01-22 DIAGNOSIS — C678 Malignant neoplasm of overlapping sites of bladder: Secondary | ICD-10-CM | POA: Diagnosis not present

## 2018-01-22 DIAGNOSIS — I2510 Atherosclerotic heart disease of native coronary artery without angina pectoris: Secondary | ICD-10-CM | POA: Diagnosis not present

## 2018-01-22 DIAGNOSIS — I129 Hypertensive chronic kidney disease with stage 1 through stage 4 chronic kidney disease, or unspecified chronic kidney disease: Secondary | ICD-10-CM | POA: Diagnosis not present

## 2018-01-22 DIAGNOSIS — Q6410 Exstrophy of urinary bladder, unspecified: Secondary | ICD-10-CM | POA: Diagnosis not present

## 2018-01-22 DIAGNOSIS — Z510 Encounter for antineoplastic radiation therapy: Secondary | ICD-10-CM | POA: Diagnosis not present

## 2018-01-22 DIAGNOSIS — C679 Malignant neoplasm of bladder, unspecified: Secondary | ICD-10-CM | POA: Diagnosis not present

## 2018-01-22 DIAGNOSIS — N182 Chronic kidney disease, stage 2 (mild): Secondary | ICD-10-CM | POA: Diagnosis not present

## 2018-01-22 NOTE — Telephone Encounter (Signed)
Copied from [place] [id number]. Topic: Quick [employer] and exercise psychologist Patient (Clinic Use ONLY) >> Jan 16, 2018 11:51 AM Cresenciano Lick, CMA wrote: Left mess for patient to call back. Re: refill request for Trazodone. PEC please transfer pt to me. >> Jan 22, 2018 12:53 PM Yvette Rack wrote: Pt returning [facility]  call about medicine  please call pt at [phone]

## 2018-01-22 NOTE — Telephone Encounter (Signed)
I spoke to pt- He states he is no longer taking Tramadol 50 mg. Instead he takes Oxycodone 10 mg 1 qd. Fentanyl patch is 1 q 3-4 days and his Trazodone is 50 mg 1 qhs. Please advise ok to refill Trazodone?

## 2018-01-23 ENCOUNTER — Ambulatory Visit
Admission: RE | Admit: 2018-01-23 | Discharge: 2018-01-23 | Disposition: A | Discharge status: Home / Self Care | Payer: Medicare Other | Source: Ambulatory Visit | Attending: Radiation Oncology | Admitting: Radiation Oncology

## 2018-01-23 ENCOUNTER — Other Ambulatory Visit: Payer: Self-pay | Admitting: Radiation Oncology

## 2018-01-23 DIAGNOSIS — Z510 Encounter for antineoplastic radiation therapy: Secondary | ICD-10-CM | POA: Diagnosis not present

## 2018-01-23 DIAGNOSIS — I2510 Atherosclerotic heart disease of native coronary artery without angina pectoris: Secondary | ICD-10-CM | POA: Diagnosis not present

## 2018-01-23 DIAGNOSIS — C678 Malignant neoplasm of overlapping sites of bladder: Secondary | ICD-10-CM | POA: Diagnosis not present

## 2018-01-23 DIAGNOSIS — N182 Chronic kidney disease, stage 2 (mild): Secondary | ICD-10-CM | POA: Diagnosis not present

## 2018-01-23 DIAGNOSIS — Q6410 Exstrophy of urinary bladder, unspecified: Secondary | ICD-10-CM | POA: Diagnosis not present

## 2018-01-23 DIAGNOSIS — I129 Hypertensive chronic kidney disease with stage 1 through stage 4 chronic kidney disease, or unspecified chronic kidney disease: Secondary | ICD-10-CM | POA: Diagnosis not present

## 2018-01-23 DIAGNOSIS — C679 Malignant neoplasm of bladder, unspecified: Secondary | ICD-10-CM | POA: Diagnosis not present

## 2018-01-23 MED ORDER — PROCHLORPERAZINE MALEATE 10 MG PO TABS: ORAL_TABLET | ORAL | 0 refills | Status: DC

## 2018-01-26 ENCOUNTER — Ambulatory Visit
Admission: RE | Admit: 2018-01-26 | Discharge: 2018-01-26 | Disposition: A | Discharge status: Home / Self Care | Payer: Medicare Other | Source: Ambulatory Visit | Attending: Radiation Oncology | Admitting: Radiation Oncology

## 2018-01-26 DIAGNOSIS — Q6410 Exstrophy of urinary bladder, unspecified: Secondary | ICD-10-CM | POA: Diagnosis not present

## 2018-01-26 DIAGNOSIS — N182 Chronic kidney disease, stage 2 (mild): Secondary | ICD-10-CM | POA: Diagnosis not present

## 2018-01-26 DIAGNOSIS — C678 Malignant neoplasm of overlapping sites of bladder: Secondary | ICD-10-CM | POA: Diagnosis not present

## 2018-01-26 DIAGNOSIS — C679 Malignant neoplasm of bladder, unspecified: Secondary | ICD-10-CM | POA: Diagnosis not present

## 2018-01-26 DIAGNOSIS — Z510 Encounter for antineoplastic radiation therapy: Secondary | ICD-10-CM | POA: Diagnosis not present

## 2018-01-26 DIAGNOSIS — I2510 Atherosclerotic heart disease of native coronary artery without angina pectoris: Secondary | ICD-10-CM | POA: Diagnosis not present

## 2018-01-26 DIAGNOSIS — I129 Hypertensive chronic kidney disease with stage 1 through stage 4 chronic kidney disease, or unspecified chronic kidney disease: Secondary | ICD-10-CM | POA: Diagnosis not present

## 2018-01-26 NOTE — Telephone Encounter (Signed)
Our system is flagging possible interactions between Fentanyl and Trazodone. It is probably safest for him to discuss alternatives with his pain management provider.

## 2018-01-27 ENCOUNTER — Ambulatory Visit
Admission: RE | Admit: 2018-01-27 | Discharge: 2018-01-27 | Disposition: A | Discharge status: Home / Self Care | Payer: Medicare Other | Source: Ambulatory Visit | Attending: Radiation Oncology | Admitting: Radiation Oncology

## 2018-01-27 DIAGNOSIS — Z510 Encounter for antineoplastic radiation therapy: Secondary | ICD-10-CM | POA: Diagnosis not present

## 2018-01-27 DIAGNOSIS — Q6410 Exstrophy of urinary bladder, unspecified: Secondary | ICD-10-CM | POA: Diagnosis not present

## 2018-01-27 DIAGNOSIS — N182 Chronic kidney disease, stage 2 (mild): Secondary | ICD-10-CM | POA: Diagnosis not present

## 2018-01-27 DIAGNOSIS — I129 Hypertensive chronic kidney disease with stage 1 through stage 4 chronic kidney disease, or unspecified chronic kidney disease: Secondary | ICD-10-CM | POA: Diagnosis not present

## 2018-01-27 DIAGNOSIS — I2510 Atherosclerotic heart disease of native coronary artery without angina pectoris: Secondary | ICD-10-CM | POA: Diagnosis not present

## 2018-01-27 DIAGNOSIS — C678 Malignant neoplasm of overlapping sites of bladder: Secondary | ICD-10-CM | POA: Diagnosis not present

## 2018-01-27 DIAGNOSIS — C679 Malignant neoplasm of bladder, unspecified: Secondary | ICD-10-CM | POA: Diagnosis not present

## 2018-01-27 NOTE — Telephone Encounter (Signed)
Left mess for patient to call back.  

## 2018-01-28 ENCOUNTER — Ambulatory Visit
Admission: RE | Admit: 2018-01-28 | Discharge: 2018-01-28 | Disposition: A | Discharge status: Home / Self Care | Payer: Medicare Other | Source: Ambulatory Visit | Attending: Radiation Oncology | Admitting: Radiation Oncology

## 2018-01-28 DIAGNOSIS — I129 Hypertensive chronic kidney disease with stage 1 through stage 4 chronic kidney disease, or unspecified chronic kidney disease: Secondary | ICD-10-CM | POA: Diagnosis not present

## 2018-01-28 DIAGNOSIS — C679 Malignant neoplasm of bladder, unspecified: Secondary | ICD-10-CM | POA: Diagnosis not present

## 2018-01-28 DIAGNOSIS — N182 Chronic kidney disease, stage 2 (mild): Secondary | ICD-10-CM | POA: Diagnosis not present

## 2018-01-28 DIAGNOSIS — Q6410 Exstrophy of urinary bladder, unspecified: Secondary | ICD-10-CM | POA: Diagnosis not present

## 2018-01-28 DIAGNOSIS — C678 Malignant neoplasm of overlapping sites of bladder: Secondary | ICD-10-CM | POA: Diagnosis not present

## 2018-01-28 DIAGNOSIS — Z510 Encounter for antineoplastic radiation therapy: Secondary | ICD-10-CM | POA: Diagnosis not present

## 2018-01-28 DIAGNOSIS — I2510 Atherosclerotic heart disease of native coronary artery without angina pectoris: Secondary | ICD-10-CM | POA: Diagnosis not present

## 2018-01-29 ENCOUNTER — Ambulatory Visit
Admission: RE | Admit: 2018-01-29 | Discharge: 2018-01-29 | Disposition: A | Discharge status: Home / Self Care | Payer: Medicare Other | Source: Ambulatory Visit | Attending: Radiation Oncology | Admitting: Radiation Oncology

## 2018-01-29 DIAGNOSIS — N182 Chronic kidney disease, stage 2 (mild): Secondary | ICD-10-CM | POA: Diagnosis not present

## 2018-01-29 DIAGNOSIS — Q6410 Exstrophy of urinary bladder, unspecified: Secondary | ICD-10-CM | POA: Diagnosis not present

## 2018-01-29 DIAGNOSIS — C679 Malignant neoplasm of bladder, unspecified: Secondary | ICD-10-CM | POA: Diagnosis not present

## 2018-01-29 DIAGNOSIS — I2510 Atherosclerotic heart disease of native coronary artery without angina pectoris: Secondary | ICD-10-CM | POA: Diagnosis not present

## 2018-01-29 DIAGNOSIS — I129 Hypertensive chronic kidney disease with stage 1 through stage 4 chronic kidney disease, or unspecified chronic kidney disease: Secondary | ICD-10-CM | POA: Diagnosis not present

## 2018-01-29 DIAGNOSIS — Z510 Encounter for antineoplastic radiation therapy: Secondary | ICD-10-CM | POA: Diagnosis not present

## 2018-01-29 DIAGNOSIS — C678 Malignant neoplasm of overlapping sites of bladder: Secondary | ICD-10-CM | POA: Diagnosis not present

## 2018-01-30 ENCOUNTER — Ambulatory Visit
Admission: RE | Admit: 2018-01-30 | Discharge: 2018-01-30 | Disposition: A | Discharge status: Home / Self Care | Payer: Medicare Other | Source: Ambulatory Visit | Attending: Radiation Oncology | Admitting: Radiation Oncology

## 2018-01-30 DIAGNOSIS — C679 Malignant neoplasm of bladder, unspecified: Secondary | ICD-10-CM | POA: Diagnosis not present

## 2018-01-30 DIAGNOSIS — C678 Malignant neoplasm of overlapping sites of bladder: Secondary | ICD-10-CM | POA: Diagnosis not present

## 2018-01-30 DIAGNOSIS — Q6410 Exstrophy of urinary bladder, unspecified: Secondary | ICD-10-CM | POA: Diagnosis not present

## 2018-01-30 DIAGNOSIS — Z510 Encounter for antineoplastic radiation therapy: Secondary | ICD-10-CM | POA: Diagnosis not present

## 2018-01-30 DIAGNOSIS — I129 Hypertensive chronic kidney disease with stage 1 through stage 4 chronic kidney disease, or unspecified chronic kidney disease: Secondary | ICD-10-CM | POA: Diagnosis not present

## 2018-01-30 DIAGNOSIS — I2510 Atherosclerotic heart disease of native coronary artery without angina pectoris: Secondary | ICD-10-CM | POA: Diagnosis not present

## 2018-01-30 DIAGNOSIS — N182 Chronic kidney disease, stage 2 (mild): Secondary | ICD-10-CM | POA: Diagnosis not present

## 2018-02-02 ENCOUNTER — Inpatient Hospital Stay: Payer: Medicare Other | Attending: Oncology

## 2018-02-02 ENCOUNTER — Ambulatory Visit
Admission: RE | Admit: 2018-02-02 | Discharge: 2018-02-02 | Disposition: A | Discharge status: Home / Self Care | Payer: Medicare Other | Source: Ambulatory Visit | Attending: Radiation Oncology | Admitting: Radiation Oncology

## 2018-02-02 DIAGNOSIS — I2510 Atherosclerotic heart disease of native coronary artery without angina pectoris: Secondary | ICD-10-CM | POA: Diagnosis not present

## 2018-02-02 DIAGNOSIS — Q6410 Exstrophy of urinary bladder, unspecified: Secondary | ICD-10-CM | POA: Diagnosis not present

## 2018-02-02 DIAGNOSIS — Z510 Encounter for antineoplastic radiation therapy: Secondary | ICD-10-CM | POA: Diagnosis not present

## 2018-02-02 DIAGNOSIS — I129 Hypertensive chronic kidney disease with stage 1 through stage 4 chronic kidney disease, or unspecified chronic kidney disease: Secondary | ICD-10-CM | POA: Diagnosis not present

## 2018-02-02 DIAGNOSIS — N182 Chronic kidney disease, stage 2 (mild): Secondary | ICD-10-CM | POA: Diagnosis not present

## 2018-02-02 DIAGNOSIS — N50819 Testicular pain, unspecified: Secondary | ICD-10-CM | POA: Diagnosis not present

## 2018-02-02 DIAGNOSIS — C679 Malignant neoplasm of bladder, unspecified: Secondary | ICD-10-CM | POA: Diagnosis not present

## 2018-02-02 DIAGNOSIS — C678 Malignant neoplasm of overlapping sites of bladder: Secondary | ICD-10-CM | POA: Diagnosis not present

## 2018-02-02 DIAGNOSIS — R109 Unspecified abdominal pain: Secondary | ICD-10-CM | POA: Diagnosis not present

## 2018-02-02 DIAGNOSIS — M898X1 Other specified disorders of bone, shoulder: Secondary | ICD-10-CM | POA: Diagnosis not present

## 2018-02-02 DIAGNOSIS — G894 Chronic pain syndrome: Secondary | ICD-10-CM | POA: Diagnosis not present

## 2018-02-02 DIAGNOSIS — G8929 Other chronic pain: Secondary | ICD-10-CM | POA: Diagnosis not present

## 2018-02-03 ENCOUNTER — Ambulatory Visit
Admission: RE | Admit: 2018-02-03 | Discharge: 2018-02-03 | Disposition: A | Discharge status: Home / Self Care | Payer: Medicare Other | Source: Ambulatory Visit | Attending: Radiation Oncology | Admitting: Radiation Oncology

## 2018-02-03 ENCOUNTER — Other Ambulatory Visit: Payer: Self-pay | Admitting: Neurology

## 2018-02-03 DIAGNOSIS — I2510 Atherosclerotic heart disease of native coronary artery without angina pectoris: Secondary | ICD-10-CM | POA: Diagnosis not present

## 2018-02-03 DIAGNOSIS — Q6410 Exstrophy of urinary bladder, unspecified: Secondary | ICD-10-CM | POA: Diagnosis not present

## 2018-02-03 DIAGNOSIS — N182 Chronic kidney disease, stage 2 (mild): Secondary | ICD-10-CM | POA: Diagnosis not present

## 2018-02-03 DIAGNOSIS — C678 Malignant neoplasm of overlapping sites of bladder: Secondary | ICD-10-CM | POA: Diagnosis not present

## 2018-02-03 DIAGNOSIS — I129 Hypertensive chronic kidney disease with stage 1 through stage 4 chronic kidney disease, or unspecified chronic kidney disease: Secondary | ICD-10-CM | POA: Diagnosis not present

## 2018-02-03 DIAGNOSIS — C679 Malignant neoplasm of bladder, unspecified: Secondary | ICD-10-CM | POA: Diagnosis not present

## 2018-02-03 DIAGNOSIS — Z510 Encounter for antineoplastic radiation therapy: Secondary | ICD-10-CM | POA: Diagnosis not present

## 2018-02-04 ENCOUNTER — Ambulatory Visit: Payer: Medicare Other | Admitting: Neurology

## 2018-02-04 ENCOUNTER — Ambulatory Visit
Admission: RE | Admit: 2018-02-04 | Discharge: 2018-02-04 | Disposition: A | Discharge status: Home / Self Care | Payer: Medicare Other | Source: Ambulatory Visit | Attending: Radiation Oncology | Admitting: Radiation Oncology

## 2018-02-04 DIAGNOSIS — C678 Malignant neoplasm of overlapping sites of bladder: Secondary | ICD-10-CM | POA: Diagnosis not present

## 2018-02-04 DIAGNOSIS — I129 Hypertensive chronic kidney disease with stage 1 through stage 4 chronic kidney disease, or unspecified chronic kidney disease: Secondary | ICD-10-CM | POA: Diagnosis not present

## 2018-02-04 DIAGNOSIS — N182 Chronic kidney disease, stage 2 (mild): Secondary | ICD-10-CM | POA: Diagnosis not present

## 2018-02-04 DIAGNOSIS — I2510 Atherosclerotic heart disease of native coronary artery without angina pectoris: Secondary | ICD-10-CM | POA: Diagnosis not present

## 2018-02-04 DIAGNOSIS — Z510 Encounter for antineoplastic radiation therapy: Secondary | ICD-10-CM | POA: Diagnosis not present

## 2018-02-04 DIAGNOSIS — Q6410 Exstrophy of urinary bladder, unspecified: Secondary | ICD-10-CM | POA: Diagnosis not present

## 2018-02-04 DIAGNOSIS — C679 Malignant neoplasm of bladder, unspecified: Secondary | ICD-10-CM | POA: Diagnosis not present

## 2018-02-05 ENCOUNTER — Ambulatory Visit
Admission: RE | Admit: 2018-02-05 | Discharge: 2018-02-05 | Disposition: A | Discharge status: Home / Self Care | Payer: Medicare Other | Source: Ambulatory Visit | Attending: Radiation Oncology | Admitting: Radiation Oncology

## 2018-02-05 ENCOUNTER — Other Ambulatory Visit: Payer: Self-pay | Admitting: Urology

## 2018-02-05 DIAGNOSIS — C678 Malignant neoplasm of overlapping sites of bladder: Secondary | ICD-10-CM | POA: Diagnosis not present

## 2018-02-05 DIAGNOSIS — C679 Malignant neoplasm of bladder, unspecified: Secondary | ICD-10-CM | POA: Diagnosis not present

## 2018-02-05 DIAGNOSIS — Q6410 Exstrophy of urinary bladder, unspecified: Secondary | ICD-10-CM | POA: Diagnosis not present

## 2018-02-05 DIAGNOSIS — N182 Chronic kidney disease, stage 2 (mild): Secondary | ICD-10-CM | POA: Diagnosis not present

## 2018-02-05 DIAGNOSIS — I129 Hypertensive chronic kidney disease with stage 1 through stage 4 chronic kidney disease, or unspecified chronic kidney disease: Secondary | ICD-10-CM | POA: Diagnosis not present

## 2018-02-05 DIAGNOSIS — I2510 Atherosclerotic heart disease of native coronary artery without angina pectoris: Secondary | ICD-10-CM | POA: Diagnosis not present

## 2018-02-05 DIAGNOSIS — Z510 Encounter for antineoplastic radiation therapy: Secondary | ICD-10-CM | POA: Diagnosis not present

## 2018-02-06 ENCOUNTER — Ambulatory Visit
Admission: RE | Admit: 2018-02-06 | Discharge: 2018-02-06 | Disposition: A | Discharge status: Home / Self Care | Payer: Medicare Other | Source: Ambulatory Visit | Attending: Radiation Oncology | Admitting: Radiation Oncology

## 2018-02-06 ENCOUNTER — Ambulatory Visit: Payer: Medicare Other

## 2018-02-06 DIAGNOSIS — Z510 Encounter for antineoplastic radiation therapy: Secondary | ICD-10-CM | POA: Diagnosis not present

## 2018-02-06 DIAGNOSIS — Q6410 Exstrophy of urinary bladder, unspecified: Secondary | ICD-10-CM | POA: Diagnosis not present

## 2018-02-06 DIAGNOSIS — I129 Hypertensive chronic kidney disease with stage 1 through stage 4 chronic kidney disease, or unspecified chronic kidney disease: Secondary | ICD-10-CM | POA: Diagnosis not present

## 2018-02-06 DIAGNOSIS — C678 Malignant neoplasm of overlapping sites of bladder: Secondary | ICD-10-CM | POA: Diagnosis not present

## 2018-02-06 DIAGNOSIS — I2510 Atherosclerotic heart disease of native coronary artery without angina pectoris: Secondary | ICD-10-CM | POA: Diagnosis not present

## 2018-02-06 DIAGNOSIS — C679 Malignant neoplasm of bladder, unspecified: Secondary | ICD-10-CM | POA: Diagnosis not present

## 2018-02-06 DIAGNOSIS — N182 Chronic kidney disease, stage 2 (mild): Secondary | ICD-10-CM | POA: Diagnosis not present

## 2018-02-07 DIAGNOSIS — Z510 Encounter for antineoplastic radiation therapy: Secondary | ICD-10-CM | POA: Insufficient documentation

## 2018-02-07 DIAGNOSIS — N182 Chronic kidney disease, stage 2 (mild): Secondary | ICD-10-CM | POA: Diagnosis not present

## 2018-02-07 DIAGNOSIS — I129 Hypertensive chronic kidney disease with stage 1 through stage 4 chronic kidney disease, or unspecified chronic kidney disease: Secondary | ICD-10-CM | POA: Insufficient documentation

## 2018-02-07 DIAGNOSIS — Q6410 Exstrophy of urinary bladder, unspecified: Secondary | ICD-10-CM | POA: Insufficient documentation

## 2018-02-07 DIAGNOSIS — C678 Malignant neoplasm of overlapping sites of bladder: Secondary | ICD-10-CM | POA: Diagnosis not present

## 2018-02-07 DIAGNOSIS — I2510 Atherosclerotic heart disease of native coronary artery without angina pectoris: Secondary | ICD-10-CM | POA: Insufficient documentation

## 2018-02-07 DIAGNOSIS — Z8719 Personal history of other diseases of the digestive system: Secondary | ICD-10-CM | POA: Insufficient documentation

## 2018-02-07 DIAGNOSIS — E785 Hyperlipidemia, unspecified: Secondary | ICD-10-CM | POA: Insufficient documentation

## 2018-02-07 DIAGNOSIS — Z87442 Personal history of urinary calculi: Secondary | ICD-10-CM | POA: Diagnosis not present

## 2018-02-07 DIAGNOSIS — Z79899 Other long term (current) drug therapy: Secondary | ICD-10-CM | POA: Insufficient documentation

## 2018-02-07 DIAGNOSIS — C679 Malignant neoplasm of bladder, unspecified: Secondary | ICD-10-CM | POA: Insufficient documentation

## 2018-02-08 ENCOUNTER — Ambulatory Visit: Payer: Medicare Other

## 2018-02-09 ENCOUNTER — Ambulatory Visit: Payer: Medicare Other

## 2018-02-09 ENCOUNTER — Ambulatory Visit
Admission: RE | Admit: 2018-02-09 | Discharge: 2018-02-09 | Disposition: A | Discharge status: Home / Self Care | Payer: Medicare Other | Source: Ambulatory Visit | Attending: Radiation Oncology | Admitting: Radiation Oncology

## 2018-02-09 DIAGNOSIS — Q6410 Exstrophy of urinary bladder, unspecified: Secondary | ICD-10-CM | POA: Diagnosis not present

## 2018-02-09 DIAGNOSIS — N182 Chronic kidney disease, stage 2 (mild): Secondary | ICD-10-CM | POA: Diagnosis not present

## 2018-02-09 DIAGNOSIS — Z510 Encounter for antineoplastic radiation therapy: Secondary | ICD-10-CM | POA: Diagnosis not present

## 2018-02-09 DIAGNOSIS — I129 Hypertensive chronic kidney disease with stage 1 through stage 4 chronic kidney disease, or unspecified chronic kidney disease: Secondary | ICD-10-CM | POA: Diagnosis not present

## 2018-02-09 DIAGNOSIS — I2510 Atherosclerotic heart disease of native coronary artery without angina pectoris: Secondary | ICD-10-CM | POA: Diagnosis not present

## 2018-02-09 DIAGNOSIS — C678 Malignant neoplasm of overlapping sites of bladder: Secondary | ICD-10-CM | POA: Diagnosis not present

## 2018-02-09 DIAGNOSIS — C679 Malignant neoplasm of bladder, unspecified: Secondary | ICD-10-CM | POA: Diagnosis not present

## 2018-02-10 ENCOUNTER — Ambulatory Visit: Payer: Medicare Other

## 2018-02-10 ENCOUNTER — Ambulatory Visit
Admission: RE | Admit: 2018-02-10 | Discharge: 2018-02-10 | Disposition: A | Discharge status: Home / Self Care | Payer: Medicare Other | Source: Ambulatory Visit | Attending: Radiation Oncology | Admitting: Radiation Oncology

## 2018-02-10 DIAGNOSIS — N182 Chronic kidney disease, stage 2 (mild): Secondary | ICD-10-CM | POA: Diagnosis not present

## 2018-02-10 DIAGNOSIS — I2510 Atherosclerotic heart disease of native coronary artery without angina pectoris: Secondary | ICD-10-CM | POA: Diagnosis not present

## 2018-02-10 DIAGNOSIS — Q6410 Exstrophy of urinary bladder, unspecified: Secondary | ICD-10-CM | POA: Diagnosis not present

## 2018-02-10 DIAGNOSIS — I129 Hypertensive chronic kidney disease with stage 1 through stage 4 chronic kidney disease, or unspecified chronic kidney disease: Secondary | ICD-10-CM | POA: Diagnosis not present

## 2018-02-10 DIAGNOSIS — C678 Malignant neoplasm of overlapping sites of bladder: Secondary | ICD-10-CM | POA: Diagnosis not present

## 2018-02-10 DIAGNOSIS — Z510 Encounter for antineoplastic radiation therapy: Secondary | ICD-10-CM | POA: Diagnosis not present

## 2018-02-10 DIAGNOSIS — C679 Malignant neoplasm of bladder, unspecified: Secondary | ICD-10-CM | POA: Diagnosis not present

## 2018-02-11 ENCOUNTER — Ambulatory Visit: Payer: Medicare Other

## 2018-02-11 DIAGNOSIS — Z79899 Other long term (current) drug therapy: Secondary | ICD-10-CM | POA: Diagnosis not present

## 2018-02-11 DIAGNOSIS — N3289 Other specified disorders of bladder: Secondary | ICD-10-CM | POA: Diagnosis not present

## 2018-02-11 DIAGNOSIS — I2510 Atherosclerotic heart disease of native coronary artery without angina pectoris: Secondary | ICD-10-CM | POA: Diagnosis not present

## 2018-02-11 DIAGNOSIS — I10 Essential (primary) hypertension: Secondary | ICD-10-CM | POA: Diagnosis not present

## 2018-02-11 DIAGNOSIS — T8389XA Other specified complication of genitourinary prosthetic devices, implants and grafts, initial encounter: Secondary | ICD-10-CM | POA: Diagnosis not present

## 2018-02-11 DIAGNOSIS — E785 Hyperlipidemia, unspecified: Secondary | ICD-10-CM | POA: Diagnosis not present

## 2018-02-11 DIAGNOSIS — Z951 Presence of aortocoronary bypass graft: Secondary | ICD-10-CM | POA: Diagnosis not present

## 2018-02-12 ENCOUNTER — Ambulatory Visit
Admission: RE | Admit: 2018-02-12 | Discharge: 2018-02-12 | Disposition: A | Discharge status: Home / Self Care | Payer: Medicare Other | Source: Ambulatory Visit | Attending: Radiation Oncology | Admitting: Radiation Oncology

## 2018-02-12 DIAGNOSIS — C678 Malignant neoplasm of overlapping sites of bladder: Secondary | ICD-10-CM | POA: Diagnosis not present

## 2018-02-12 DIAGNOSIS — Q6410 Exstrophy of urinary bladder, unspecified: Secondary | ICD-10-CM | POA: Diagnosis not present

## 2018-02-12 DIAGNOSIS — C679 Malignant neoplasm of bladder, unspecified: Secondary | ICD-10-CM | POA: Diagnosis not present

## 2018-02-12 DIAGNOSIS — N182 Chronic kidney disease, stage 2 (mild): Secondary | ICD-10-CM | POA: Diagnosis not present

## 2018-02-12 DIAGNOSIS — Z510 Encounter for antineoplastic radiation therapy: Secondary | ICD-10-CM | POA: Diagnosis not present

## 2018-02-12 DIAGNOSIS — I129 Hypertensive chronic kidney disease with stage 1 through stage 4 chronic kidney disease, or unspecified chronic kidney disease: Secondary | ICD-10-CM | POA: Diagnosis not present

## 2018-02-12 DIAGNOSIS — I2510 Atherosclerotic heart disease of native coronary artery without angina pectoris: Secondary | ICD-10-CM | POA: Diagnosis not present

## 2018-02-13 ENCOUNTER — Ambulatory Visit
Admission: RE | Admit: 2018-02-13 | Discharge: 2018-02-13 | Disposition: A | Discharge status: Home / Self Care | Payer: Medicare Other | Source: Ambulatory Visit | Attending: Radiation Oncology | Admitting: Radiation Oncology

## 2018-02-13 DIAGNOSIS — I2510 Atherosclerotic heart disease of native coronary artery without angina pectoris: Secondary | ICD-10-CM | POA: Diagnosis not present

## 2018-02-13 DIAGNOSIS — C678 Malignant neoplasm of overlapping sites of bladder: Secondary | ICD-10-CM | POA: Diagnosis not present

## 2018-02-13 DIAGNOSIS — C679 Malignant neoplasm of bladder, unspecified: Secondary | ICD-10-CM | POA: Diagnosis not present

## 2018-02-13 DIAGNOSIS — Z510 Encounter for antineoplastic radiation therapy: Secondary | ICD-10-CM | POA: Diagnosis not present

## 2018-02-13 DIAGNOSIS — Q6410 Exstrophy of urinary bladder, unspecified: Secondary | ICD-10-CM | POA: Diagnosis not present

## 2018-02-13 DIAGNOSIS — N182 Chronic kidney disease, stage 2 (mild): Secondary | ICD-10-CM | POA: Diagnosis not present

## 2018-02-13 DIAGNOSIS — I129 Hypertensive chronic kidney disease with stage 1 through stage 4 chronic kidney disease, or unspecified chronic kidney disease: Secondary | ICD-10-CM | POA: Diagnosis not present

## 2018-02-16 ENCOUNTER — Ambulatory Visit: Payer: Medicare Other

## 2018-02-17 ENCOUNTER — Ambulatory Visit
Admission: RE | Admit: 2018-02-17 | Discharge: 2018-02-17 | Disposition: A | Discharge status: Home / Self Care | Payer: Medicare Other | Source: Ambulatory Visit | Attending: Radiation Oncology | Admitting: Radiation Oncology

## 2018-02-17 DIAGNOSIS — Q6410 Exstrophy of urinary bladder, unspecified: Secondary | ICD-10-CM | POA: Diagnosis not present

## 2018-02-17 DIAGNOSIS — N182 Chronic kidney disease, stage 2 (mild): Secondary | ICD-10-CM | POA: Diagnosis not present

## 2018-02-17 DIAGNOSIS — Z510 Encounter for antineoplastic radiation therapy: Secondary | ICD-10-CM | POA: Diagnosis not present

## 2018-02-17 DIAGNOSIS — C679 Malignant neoplasm of bladder, unspecified: Secondary | ICD-10-CM | POA: Diagnosis not present

## 2018-02-17 DIAGNOSIS — C678 Malignant neoplasm of overlapping sites of bladder: Secondary | ICD-10-CM | POA: Diagnosis not present

## 2018-02-17 DIAGNOSIS — I2510 Atherosclerotic heart disease of native coronary artery without angina pectoris: Secondary | ICD-10-CM | POA: Diagnosis not present

## 2018-02-17 DIAGNOSIS — I129 Hypertensive chronic kidney disease with stage 1 through stage 4 chronic kidney disease, or unspecified chronic kidney disease: Secondary | ICD-10-CM | POA: Diagnosis not present

## 2018-02-18 ENCOUNTER — Ambulatory Visit
Admission: RE | Admit: 2018-02-18 | Discharge: 2018-02-18 | Disposition: A | Discharge status: Home / Self Care | Payer: Medicare Other | Source: Ambulatory Visit | Attending: Radiation Oncology | Admitting: Radiation Oncology

## 2018-02-18 DIAGNOSIS — Q6410 Exstrophy of urinary bladder, unspecified: Secondary | ICD-10-CM | POA: Diagnosis not present

## 2018-02-18 DIAGNOSIS — C678 Malignant neoplasm of overlapping sites of bladder: Secondary | ICD-10-CM | POA: Diagnosis not present

## 2018-02-18 DIAGNOSIS — I129 Hypertensive chronic kidney disease with stage 1 through stage 4 chronic kidney disease, or unspecified chronic kidney disease: Secondary | ICD-10-CM | POA: Diagnosis not present

## 2018-02-18 DIAGNOSIS — Z510 Encounter for antineoplastic radiation therapy: Secondary | ICD-10-CM | POA: Diagnosis not present

## 2018-02-18 DIAGNOSIS — I2510 Atherosclerotic heart disease of native coronary artery without angina pectoris: Secondary | ICD-10-CM | POA: Diagnosis not present

## 2018-02-18 DIAGNOSIS — C679 Malignant neoplasm of bladder, unspecified: Secondary | ICD-10-CM | POA: Diagnosis not present

## 2018-02-18 DIAGNOSIS — N182 Chronic kidney disease, stage 2 (mild): Secondary | ICD-10-CM | POA: Diagnosis not present

## 2018-02-19 ENCOUNTER — Ambulatory Visit
Admission: RE | Admit: 2018-02-19 | Discharge: 2018-02-19 | Disposition: A | Discharge status: Home / Self Care | Payer: Medicare Other | Source: Ambulatory Visit | Attending: Radiation Oncology | Admitting: Radiation Oncology

## 2018-02-19 DIAGNOSIS — N182 Chronic kidney disease, stage 2 (mild): Secondary | ICD-10-CM | POA: Diagnosis not present

## 2018-02-19 DIAGNOSIS — C679 Malignant neoplasm of bladder, unspecified: Secondary | ICD-10-CM | POA: Diagnosis not present

## 2018-02-19 DIAGNOSIS — I129 Hypertensive chronic kidney disease with stage 1 through stage 4 chronic kidney disease, or unspecified chronic kidney disease: Secondary | ICD-10-CM | POA: Diagnosis not present

## 2018-02-19 DIAGNOSIS — Z510 Encounter for antineoplastic radiation therapy: Secondary | ICD-10-CM | POA: Diagnosis not present

## 2018-02-19 DIAGNOSIS — C678 Malignant neoplasm of overlapping sites of bladder: Secondary | ICD-10-CM | POA: Diagnosis not present

## 2018-02-19 DIAGNOSIS — Q6410 Exstrophy of urinary bladder, unspecified: Secondary | ICD-10-CM | POA: Diagnosis not present

## 2018-02-19 DIAGNOSIS — I2510 Atherosclerotic heart disease of native coronary artery without angina pectoris: Secondary | ICD-10-CM | POA: Diagnosis not present

## 2018-02-20 ENCOUNTER — Ambulatory Visit
Admission: RE | Admit: 2018-02-20 | Discharge: 2018-02-20 | Disposition: A | Discharge status: Home / Self Care | Payer: Medicare Other | Source: Ambulatory Visit | Attending: Radiation Oncology | Admitting: Radiation Oncology

## 2018-02-20 ENCOUNTER — Ambulatory Visit: Payer: Medicare Other

## 2018-02-20 DIAGNOSIS — I2510 Atherosclerotic heart disease of native coronary artery without angina pectoris: Secondary | ICD-10-CM | POA: Diagnosis not present

## 2018-02-20 DIAGNOSIS — C678 Malignant neoplasm of overlapping sites of bladder: Secondary | ICD-10-CM | POA: Diagnosis not present

## 2018-02-20 DIAGNOSIS — Q6410 Exstrophy of urinary bladder, unspecified: Secondary | ICD-10-CM | POA: Diagnosis not present

## 2018-02-20 DIAGNOSIS — C679 Malignant neoplasm of bladder, unspecified: Secondary | ICD-10-CM | POA: Diagnosis not present

## 2018-02-20 DIAGNOSIS — I129 Hypertensive chronic kidney disease with stage 1 through stage 4 chronic kidney disease, or unspecified chronic kidney disease: Secondary | ICD-10-CM | POA: Diagnosis not present

## 2018-02-20 DIAGNOSIS — Z510 Encounter for antineoplastic radiation therapy: Secondary | ICD-10-CM | POA: Diagnosis not present

## 2018-02-20 DIAGNOSIS — N182 Chronic kidney disease, stage 2 (mild): Secondary | ICD-10-CM | POA: Diagnosis not present

## 2018-02-22 NOTE — Progress Notes (Deleted)
Cardiology Office Note   Date:  02/22/2018   ID:  Andrew Joyce, DOB [birth year], MRN [id number]  PCP:  [name], NP  Cardiologist:  Dr. Mandell Pangborn [place]      History of Present Illness: Andrew Joyce is a 58 y.o. male seen for follow up CAD. He has a hx of CAD s/p CABG in 2008, HTN, HL, CKD.  He suffered a non-STEMI in February 2013. Culprit was felt to be an occluded SVG-OM1/OM2. Native circumflex had 40-50% distal stenosis. There were no targets for revascularization and he was treated medically.   He has a history orthostatic intolerance. Imdur dose was reduced.  Echo May 2015 demonstrated EF 55-60%, mild inferior hypokinesis, top normal LA/RA size.Poorly visualized aortic valve, however, there is mild central AI - no stenosis.      He does have recurrent UTIs and renal stones followed by urology. He has frequent calculi passing multiple stones a month. This has improved with something to increase his citrate level.  He underwent cystourethroscopy with ureteral stenting and resection of a bladder tumor last summer.  Stage IV. He was started on chemotherapy but had progression with ureteral obstruction. S/p bilateral nephrostomy tubes. He is now receiving palliative RT.   He has a history of  severe hypertriglyceridemia and last labs in January showed elevated sugar.     Recent Labs: 05/05/2017: TSH 2.35 07/24/2017: HDL 33; LDL Calculated 77 11/04/2017: ALT 28; Creatinine 2.03; Hemoglobin 7.0; Potassium 4.7  Wt Readings from Last 3 Encounters:  12/17/17 146 lb 12.8 oz (66.6 kg)  12/17/17 146 lb 12.8 oz (66.6 kg)  10/28/17 154 lb (69.9 kg)     Past Medical History:  Diagnosis Date  . 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 Chronic kidney disease (CKD), stage II (mild)   . Congenital birth defect   . Coronary artery disease    a. s/p CABG in 2008; b.  s/p cath in 2011 for med rx.;  c.  NSTEMI (02/2011):  LHC (02/2011):  LAD  occluded, distal LAD filled via left to left collaterals, distal CFX 40-50%, proximal RCA occluded (right to right collaterals), distal RCA occluded, S-RCA occluded proximally, S-OM1/OM2 occluded (new from 2011 - culprit), L-LAD ok, ant apical HK, EF 45-50% - med Rx.  [name] Dyslipidemia   . History of renal stone   . Hx of cardiovascular stress test    Nuclear (06/2009):  EF 71%, small area of infarct/ischemia in inferior wall-unchanged from prior  . Hx of echocardiogram    Echo (05/2013):  EF 55-60%, inf HK, mild AI, normal RVSF, RVSP 28 mmHg  . Hypertension   . Pancreatitis   . Recurrent UTI     Current Outpatient Medications  Medication Sig Dispense Refill  . allopurinol (ZYLOPRIM) 100 MG tablet Take 1 tablet (100 mg total) by mouth daily. 30 tablet 6  . amLODipine (NORVASC) 5 MG tablet Take 1 tablet (5 mg total) by mouth daily. 30 tablet 6  . amoxicillin (AMOXIL) 250 MG capsule Take 250 mg by mouth daily.  11  . baclofen (LIORESAL) 10 MG tablet TAKE 2 TABLETS BY MOUTH THREE TIMES DAILY 180 each 5  . carbamazepine (TEGRETOL) 200 MG tablet TAKE 1 TABLET BY MOUTH TWICE DAILY 60 tablet 5  . DULoxetine (CYMBALTA) 20 MG capsule Take 20 mg by mouth at bedtime. For Sleep Aide  2  . fenofibrate 160 MG tablet TAKE 1 TABLET BY MOUTH ONCE  DAILY 90 tablet 1  . fentaNYL (DURAGESIC - DOSED MCG/HR) 12 MCG/HR Place onto the skin.    [name] glucose blood (ACCU-CHEK GUIDE) test strip Use to check blood sugars 2 times daily. Dx Code: E11.9 100 each 12  . isosorbide mononitrate (IMDUR) 60 MG 24 hr tablet TAKE 1 & 1/2 (ONE & ONE-HALF) TABLETS BY MOUTH ONCE DAILY 135 tablet 2  . JANUVIA 50 MG tablet TAKE 1 TABLET BY MOUTH ONCE DAILY 90 tablet 0  . levothyroxine (SYNTHROID, LEVOTHROID) 25 MCG tablet Take 1 tablet (25 mcg total) by mouth daily. 30 tablet 1  . lidocaine-prilocaine (EMLA) cream Apply 1 application topically as needed. 30 g 0  . losartan (COZAAR) 100 MG tablet TAKE 1 TABLET BY MOUTH ONCE DAILY 90 tablet 3    . meloxicam (MOBIC) 15 MG tablet Take 1 tablet (15 mg total) by mouth daily. 30 tablet 1  . methenamine (MANDELAMINE) 1 g tablet Take 1,000 mg by mouth 3 (three) times daily.     . metoprolol tartrate (LOPRESSOR) 50 MG tablet Take 1 tablet (50 mg total) by mouth 2 (two) times daily. 180 tablet 1  . nitroGLYCERIN (NITROSTAT) 0.4 MG SL tablet Place 1 tablet (0.4 mg total) under the tongue every 5 (five) minutes as needed. For chest pain 25 tablet 3  . Oxycodone HCl 10 MG TABS Take by mouth.    . prochlorperazine (COMPAZINE) 10 MG tablet TAKE 1 TABLET BY MOUTH EVERY 6 HOURS AS NEEDED FOR NAUSEA FOR VOMITING 30 tablet 0  . prochlorperazine (COMPAZINE) 10 MG tablet TAKE 1 TABLET BY MOUTH EVERY 6 HOURS AS NEEDED FOR NAUSEA OR FOR VOMITING 30 tablet 0  . rOPINIRole (REQUIP) 0.25 MG tablet TAKE 1 TABLET BY MOUTH 3 HOURS BEFORE BEDTIME 30 tablet 5  . rosuvastatin (CRESTOR) 40 MG tablet Take 1 tablet (40 mg total) by mouth daily. 30 tablet 6  . rosuvastatin (CRESTOR) 40 MG tablet Take 40 mg by mouth daily.  6  . sodium chloride flush 0.9 % SOLN injection Flush 10 mL of saline into each nephrostomy tube daily.    . sodium chloride flush 0.9 % SOLN injection 10 mL by Intra-cannular route two (2) times a day.    . traZODone (DESYREL) 50 MG tablet TAKE 1/2 TO 1 (ONE-HALF TO ONE) TABLET BY MOUTH AT BEDTIME AS NEEDED FOR SLEEP 30 tablet 0   No current facility-administered medications for this visit.     Allergies:   Patient has no known allergies.   Social History:  The patient  reports that he has never smoked. He has never used smokeless tobacco. He reports that he does not drink alcohol or use drugs.   Family History:  The patient's family history includes Healthy in his brother and sister; Hyperlipidemia in his father and mother; Hypertension in his father and mother.   ROS:  Please see the history of present illness.     All other systems reviewed and negative.   PHYSICAL EXAM: VS:  There were no  vitals taken for this visit.  GENERAL:  Well appearing WM in NAD HEENT:  PERRL, EOMI, sclera are clear. Oropharynx is clear. NECK:  No jugular venous distention, carotid upstroke brisk and symmetric, no bruits, no thyromegaly or adenopathy LUNGS:  Clear to auscultation bilaterally CHEST:  Unremarkable HEART:  RRR,  PMI not displaced or sustained,S1 and S2 within normal limits, no S3, no S4: no clicks, no rubs, no murmurs ABD:  Soft, nontender. BS +, no masses or  bruits. No hepatomegaly, no splenomegaly EXT:  2 + pulses throughout, no edema, no cyanosis no clubbing SKIN:  Warm and dry.  No rashes NEURO:  Alert and oriented x 3. Cranial nerves II through XII intact. PSYCH:  Cognitively intact      Laboratory data:  Lab Results  Component Value Date   WBC 3.4 (L) 11/04/2017   HGB 7.0 (LL) 11/04/2017   HCT 21.7 (L) 11/04/2017   PLT 336 11/04/2017   GLUCOSE 189 (H) 11/04/2017   CHOL 148 07/24/2017   TRIG 188 (H) 07/24/2017   HDL 33 (L) 07/24/2017   LDLDIRECT 45 03/01/2015   LDLCALC 77 07/24/2017   ALT 28 11/04/2017   AST 31 11/04/2017   NA 138 11/04/2017   K 4.7 11/04/2017   CL 103 11/04/2017   CREATININE 2.03 (H) 11/04/2017   BUN 44 (H) 11/04/2017   CO2 26 11/04/2017   TSH 2.35 05/05/2017   INR 1.20 09/19/2017   HGBA1C 7.7 (H) 07/24/2017   Dated 12/08/17: Hgb 9.6. creatinine 2.07, potassium 5.2. otherwise CMET is normal.    ASSESSMENT AND PLAN:  1. Orthostatic intolerance: this has resolved with reduction in medication. 2. CAD (coronary artery disease): s/p remote CABG in 2008. Occluded SVG to OM1 and OM2 in 2013.  Stable angina pectoris class 1. Continue aspirin, Plavix, nitrates, beta blocker.  3. HTN (hypertension):  This is well controlled.  4. Hyperlipidemia- combined with severe hypertriglyceridemia. :   On pravastatin, fenofibrate, fish oil. Last lipid panel showed significant improvement in triglycerides to 262. Will update today 5. CKD (chronic kidney disease)  stage 2, GFR 60-89 ml/min:   6. Renal calculi- recurrent 7. Elevated blood glucose. Will repeat today and check an A1c.  I will follow up in 6 months.

## 2018-02-23 ENCOUNTER — Ambulatory Visit: Payer: Medicare Other

## 2018-02-24 ENCOUNTER — Ambulatory Visit
Admission: RE | Admit: 2018-02-24 | Discharge: 2018-02-24 | Disposition: A | Discharge status: Home / Self Care | Payer: Medicare Other | Source: Ambulatory Visit | Attending: Radiation Oncology | Admitting: Radiation Oncology

## 2018-02-24 ENCOUNTER — Ambulatory Visit: Payer: Medicare Other | Admitting: Cardiology

## 2018-02-24 DIAGNOSIS — I129 Hypertensive chronic kidney disease with stage 1 through stage 4 chronic kidney disease, or unspecified chronic kidney disease: Secondary | ICD-10-CM | POA: Diagnosis not present

## 2018-02-24 DIAGNOSIS — C678 Malignant neoplasm of overlapping sites of bladder: Secondary | ICD-10-CM | POA: Diagnosis not present

## 2018-02-24 DIAGNOSIS — Z510 Encounter for antineoplastic radiation therapy: Secondary | ICD-10-CM | POA: Diagnosis not present

## 2018-02-24 DIAGNOSIS — Q6410 Exstrophy of urinary bladder, unspecified: Secondary | ICD-10-CM | POA: Diagnosis not present

## 2018-02-24 DIAGNOSIS — C679 Malignant neoplasm of bladder, unspecified: Secondary | ICD-10-CM | POA: Diagnosis not present

## 2018-02-24 DIAGNOSIS — I2510 Atherosclerotic heart disease of native coronary artery without angina pectoris: Secondary | ICD-10-CM | POA: Diagnosis not present

## 2018-02-24 DIAGNOSIS — N182 Chronic kidney disease, stage 2 (mild): Secondary | ICD-10-CM | POA: Diagnosis not present

## 2018-02-25 ENCOUNTER — Ambulatory Visit
Admission: RE | Admit: 2018-02-25 | Discharge: 2018-02-25 | Disposition: A | Discharge status: Home / Self Care | Payer: Medicare Other | Source: Ambulatory Visit | Attending: Radiation Oncology | Admitting: Radiation Oncology

## 2018-02-25 ENCOUNTER — Encounter: Payer: Self-pay | Admitting: Nurse Practitioner

## 2018-02-25 ENCOUNTER — Ambulatory Visit: Payer: Medicare Other

## 2018-02-25 ENCOUNTER — Ambulatory Visit (INDEPENDENT_AMBULATORY_CARE_PROVIDER_SITE_OTHER): Payer: Medicare Other | Admitting: Nurse Practitioner

## 2018-02-25 VITALS — BP 118/74 | HR 89 | Ht 70.0 in | Wt 134.0 lb

## 2018-02-25 DIAGNOSIS — I2510 Atherosclerotic heart disease of native coronary artery without angina pectoris: Secondary | ICD-10-CM | POA: Diagnosis not present

## 2018-02-25 DIAGNOSIS — C678 Malignant neoplasm of overlapping sites of bladder: Secondary | ICD-10-CM | POA: Diagnosis not present

## 2018-02-25 DIAGNOSIS — C679 Malignant neoplasm of bladder, unspecified: Secondary | ICD-10-CM | POA: Diagnosis not present

## 2018-02-25 DIAGNOSIS — Q6410 Exstrophy of urinary bladder, unspecified: Secondary | ICD-10-CM | POA: Diagnosis not present

## 2018-02-25 DIAGNOSIS — G4700 Insomnia, unspecified: Secondary | ICD-10-CM | POA: Diagnosis not present

## 2018-02-25 DIAGNOSIS — E1129 Type 2 diabetes mellitus with other diabetic kidney complication: Secondary | ICD-10-CM | POA: Diagnosis not present

## 2018-02-25 DIAGNOSIS — Z510 Encounter for antineoplastic radiation therapy: Secondary | ICD-10-CM | POA: Diagnosis not present

## 2018-02-25 DIAGNOSIS — N182 Chronic kidney disease, stage 2 (mild): Secondary | ICD-10-CM | POA: Diagnosis not present

## 2018-02-25 DIAGNOSIS — I129 Hypertensive chronic kidney disease with stage 1 through stage 4 chronic kidney disease, or unspecified chronic kidney disease: Secondary | ICD-10-CM | POA: Diagnosis not present

## 2018-02-25 NOTE — Assessment & Plan Note (Signed)
Continue januvia Update A1c - Hemoglobin A1c; Future

## 2018-02-25 NOTE — Assessment & Plan Note (Signed)
I am concerned for medication interactions with fentanyl, which we discussed I have asked him to f/u with pain management and oncology to discuss options to treat insomnia Home management, red flags and return precautions including when to seek immediate care discussed and printed on AVS

## 2018-02-25 NOTE — Progress Notes (Signed)
Andrew Joyce is a 58 y.o. male with the following history as recorded in Epic[employer]:  Patient Active Problem List   Diagnosis Date Noted  . Port-A-Cath in place 09/23/2017  . Malignant neoplasm of urinary bladder ([place]) 09/04/2017  . Diabetes mellitus ([place]) 08/08/2017  . Healthcare maintenance 01/21/2017  . Benign fasciculation-cramp syndrome 08/22/2015  . Chronic thoracic back pain 03/20/2015  . Fasciculation of lower extremity 11/18/2014  . Bilateral thoracic back pain 09/28/2014  . Nonallopathic lesion-rib cage 07/14/2014  . Nonallopathic lesion of thoracic region 07/14/2014  . Nonallopathic lesion of cervical region 07/14/2014  . Posture imbalance 04/13/2014  . Chronic kidney disease, stage 3 ([place]) 06/01/2013  . Angina pectoris ([place]) 09/18/2011  . Dyspnea 06/13/2011  . NSTEMI (non-ST elevated myocardial infarction) ([place]) 02/27/2011  . CAD (coronary artery disease) 02/06/2011  . HTN (hypertension) 02/06/2011  . Hyperlipidemia 02/06/2011    Current Outpatient Medications  Medication Sig Dispense Refill  . allopurinol (ZYLOPRIM) 100 MG tablet Take 1 tablet (100 mg total) by mouth daily. 30 tablet 6  . amLODipine (NORVASC) 5 MG tablet Take 1 tablet (5 mg total) by mouth daily. 30 tablet 6  . amoxicillin (AMOXIL) 250 MG capsule Take 250 mg by mouth daily.  11  . baclofen (LIORESAL) 10 MG tablet TAKE 2 TABLETS BY MOUTH THREE TIMES DAILY 180 each 5  . carbamazepine (TEGRETOL) 200 MG tablet TAKE 1 TABLET BY MOUTH TWICE DAILY 60 tablet 5  . DULoxetine (CYMBALTA) 20 MG capsule Take 20 mg by mouth at bedtime. For Sleep Aide  2  . fenofibrate 160 MG tablet TAKE 1 TABLET BY MOUTH ONCE DAILY 90 tablet 1  . fentaNYL (DURAGESIC - DOSED MCG/HR) 12 MCG/HR Place onto the skin.    [name] glucose blood (ACCU-CHEK GUIDE) test strip Use to check blood sugars 2 times daily. Dx Code: E11.9 100 each 12  . isosorbide mononitrate (IMDUR) 60 MG 24 hr tablet TAKE 1 & 1/2 (ONE & ONE-HALF) TABLETS BY MOUTH  ONCE DAILY 135 tablet 2  . JANUVIA 50 MG tablet TAKE 1 TABLET BY MOUTH ONCE DAILY 90 tablet 0  . levothyroxine (SYNTHROID, LEVOTHROID) 25 MCG tablet Take 1 tablet (25 mcg total) by mouth daily. 30 tablet 1  . lidocaine-prilocaine (EMLA) cream Apply 1 application topically as needed. 30 g 0  . losartan (COZAAR) 100 MG tablet TAKE 1 TABLET BY MOUTH ONCE DAILY 90 tablet 3  . meloxicam (MOBIC) 15 MG tablet Take 1 tablet (15 mg total) by mouth daily. 30 tablet 1  . methenamine (MANDELAMINE) 1 g tablet Take 1,000 mg by mouth 3 (three) times daily.     . metoprolol tartrate (LOPRESSOR) 50 MG tablet Take 1 tablet (50 mg total) by mouth 2 (two) times daily. 180 tablet 1  . nitroGLYCERIN (NITROSTAT) 0.4 MG SL tablet Place 1 tablet (0.4 mg total) under the tongue every 5 (five) minutes as needed. For chest pain 25 tablet 3  . Oxycodone HCl 10 MG TABS Take by mouth.    . prochlorperazine (COMPAZINE) 10 MG tablet TAKE 1 TABLET BY MOUTH EVERY 6 HOURS AS NEEDED FOR NAUSEA FOR VOMITING 30 tablet 0  . prochlorperazine (COMPAZINE) 10 MG tablet TAKE 1 TABLET BY MOUTH EVERY 6 HOURS AS NEEDED FOR NAUSEA OR FOR VOMITING 30 tablet 0  . rOPINIRole (REQUIP) 0.25 MG tablet TAKE 1 TABLET BY MOUTH 3 HOURS BEFORE BEDTIME 30 tablet 5  . rosuvastatin (CRESTOR) 40 MG tablet Take 40 mg by mouth daily.  6  .  sodium chloride flush 0.9 % SOLN injection Flush 10 mL of saline into each nephrostomy tube daily.    . sodium chloride flush 0.9 % SOLN injection 10 mL by Intra-cannular route two (2) times a day.    . rosuvastatin (CRESTOR) 40 MG tablet Take 1 tablet (40 mg total) by mouth daily. 30 tablet 6  . traZODone (DESYREL) 50 MG tablet TAKE 1/2 TO 1 (ONE-HALF TO ONE) TABLET BY MOUTH AT BEDTIME AS NEEDED FOR SLEEP (Patient not taking: Reported on 02/25/2018) 30 tablet 0   No current facility-administered medications for this visit.     Allergies: Patient has no known allergies.  Past Medical History:  Diagnosis Date  . Bladder  exstrophy     Congenital- had multiple surgeries as a child  . Chronic kidney disease (CKD), stage II (mild)   . Congenital birth defect   . Coronary artery disease    a. s/p CABG in 2008; b.  s/p cath in 2011 for med rx.;  c.  NSTEMI (02/2011):  LHC (02/2011):  LAD occluded, distal LAD filled via left to left collaterals, distal CFX 40-50%, proximal RCA occluded (right to right collaterals), distal RCA occluded, S-RCA occluded proximally, S-OM1/OM2 occluded (new from 2011 - culprit), L-LAD ok, ant apical HK, EF 45-50% - med Rx.  [name] Dyslipidemia   . History of renal stone   . Hx of cardiovascular stress test    Nuclear (06/2009):  EF 71%, small area of infarct/ischemia in inferior wall-unchanged from prior  . Hx of echocardiogram    Echo (05/2013):  EF 55-60%, inf HK, mild AI, normal RVSF, RVSP 28 mmHg  . Hypertension   . Pancreatitis   . Recurrent UTI     Past Surgical History:  Procedure Laterality Date  . CARDIAC CATHETERIZATION  07/28/2009   EF 60%; Grafts patent except for SVG to the AM and PD. Native RCA is occluded.  [name] CARDIAC CATHETERIZATION  03/20/2006   EF 60-65%  . CARDIOVASCULAR STRESS TEST  06/12/2009   EF 71%, SMALL AREA OF INFARCT/ISCHEMIA IN INFERIOR WALL; FELT TO NOT BE CHANGED.  [name] CORONARY ARTERY BYPASS GRAFT  03/2006   LIMA GRAFT TO LAD, SAPHENOUS VEIN GRAFT TO THE ACUTE MARGINAL BRANCH THE RIGHT CORONARY, SAPHENOUS VEIN GRAFT TO THE PDA, SEQUENTIAL VEIN GRAFT TO THE FIRST AND SECOND OBTUSE MARGINAL VESSELS  . IR IMAGING GUIDED PORT INSERTION  09/19/2017  . KIDNEY SURGERY    . LEFT HEART CATHETERIZATION WITH CORONARY ANGIOGRAM N/A 02/26/2011   Procedure: LEFT HEART CATHETERIZATION WITH CORONARY ANGIOGRAM;  Surgeon: Thayer Headings, MD;  Location[facility] C[facility] LAB;  Service: Cardiovascular;  Laterality: N/A;    Family History  Problem Relation Age of Onset  . Hyperlipidemia Mother        Living, 3  . Hypertension Mother   . Hyperlipidemia Father        Living 37  .  Hypertension Father   . Healthy Sister   . Healthy Brother     Social History   Tobacco Use  . Smoking status: Never Smoker  . Smokeless tobacco: Never Used  Substance Use Topics  . Alcohol use: No    Alcohol/week: 0.0 standard drinks     Subjective:  Mr Rieman is here today requesting medication for insomnia We will also follow up on diabetes. He was started on januvia 50 daily on 08/08/17 for new diagnosis of diabetes, Reports daily medication compliance without adverse medication effects. He did not return as requested to have  a1c rechecked Due to new diagnosis of bladder cancer, he has been very busy with oncology appointments/treatments, but says he has been checking glucose at home as well as being checked at oncology appointments, recent readings 130s-180s. We started him on a trial of trazodone for insomnia on 09/12/17, which he felt was working well but he is now on fentanyl by oncology/pain management for pain so his last trazodone refill was declined due to worry for medication interaction, he was recommended to f/u with oncologist/pain management to discuss alternates to sleep but he says he did not get this message. He has not discussed his insomnia with oncology or pain providers, tells me that he is only sleeping 1-2 hours a night since he ran out of trazodone, feels too restless and anxious to fall asleep or stay asleep.  ROS- See HPI   Objective:  Vitals:   02/25/18 1416 02/25/18 1451  BP: 118/74   Pulse: 84 89  SpO2:  99%  Weight: 134 lb (60.8 kg)   Height: 5\' 10"  (1.778 m)     General: Well developed, well nourished, in no acute distress, chronically ill appearing  Skin : Warm and dry.  Head: Normocephalic and atraumatic  Eyes: Sclera and conjunctiva clear; pupils round and reactive to light; extraocular movements intact  Oropharynx: Pink, supple. No suspicious lesions  Neck: Supple without thyromegaly, adenopathy  Lungs: Effort unlabored, no respiratory  distress CVS exam: normal rate and regular rhythm.  Extremities: No edema, cyanosis, clubbing  Vessels: Symmetric bilaterally  Neurologic: Alert and oriented; speech intact; face symmetrical; moves all extremities well; CNII-XII intact without focal deficit  Psychiatric: Normal mood and affect.   Assessment:  1. Insomnia, unspecified type   2. Type 2 diabetes mellitus with other diabetic kidney complication, without long-term current use of insulin ([place])     Plan:   Return in about 6 months (around 08/26/2018) for routine follow up- diabetes.  Orders Placed This Encounter  Procedures  . Hemoglobin A1c    Standing Status:   Future    Standing Expiration Date:   02/26/2019    Requested Prescriptions    No prescriptions requested or ordered in this encounter

## 2018-02-25 NOTE — Patient Instructions (Addendum)
Head downstairs for lab work  Please follow up with your oncologist to help with sleep, so they can help pick the safest medication to help you   Insomnia Insomnia is a sleep disorder that makes it difficult to fall asleep or stay asleep. Insomnia can cause fatigue, low energy, difficulty concentrating, mood swings, and poor performance at work or school. There are three different ways to classify insomnia:  Difficulty falling asleep.  Difficulty staying asleep.  Waking up too early in the morning. Any type of insomnia can be long-term (chronic) or short-term (acute). Both are common. Short-term insomnia usually lasts for three months or less. Chronic insomnia occurs at least three times a week for longer than three months. What are the causes? Insomnia may be caused by another condition, situation, or substance, such as:  Anxiety.  Certain medicines.  Gastroesophageal reflux disease (GERD) or other gastrointestinal conditions.  Asthma or other breathing conditions.  Restless legs syndrome, sleep apnea, or other sleep disorders.  Chronic pain.  Menopause.  Stroke.  Abuse of alcohol, tobacco, or illegal drugs.  Mental health conditions, such as depression.  Caffeine.  Neurological disorders, such as Alzheimer's disease.  An overactive thyroid (hyperthyroidism). Sometimes, the cause of insomnia may not be known. What increases the risk? Risk factors for insomnia include:  Gender. Women are affected more often than men.  Age. Insomnia is more common as you get older.  Stress.  Lack of exercise.  Irregular work schedule or working night shifts.  Traveling between different time zones.  Certain medical and mental health conditions. What are the signs or symptoms? If you have insomnia, the main symptom is having trouble falling asleep or having trouble staying asleep. This may lead to other symptoms, such as:  Feeling fatigued or having low energy.  Feeling  nervous about going to sleep.  Not feeling rested in the morning.  Having trouble concentrating.  Feeling irritable, anxious, or depressed. How is this diagnosed? This condition may be diagnosed based on:  Your symptoms and medical history. Your health care provider may ask about: ? Your sleep habits. ? Any medical conditions you have. ? Your mental health.  A physical exam. How is this treated? Treatment for insomnia depends on the cause. Treatment may focus on treating an underlying condition that is causing insomnia. Treatment may also include:  Medicines to help you sleep.  Counseling or therapy.  Lifestyle adjustments to help you sleep better. Follow these instructions at home: Eating and drinking   Limit or avoid alcohol, caffeinated beverages, and cigarettes, especially close to bedtime. These can disrupt your sleep.  Do not eat a large meal or eat spicy foods right before bedtime. This can lead to digestive discomfort that can make it hard for you to sleep. Sleep habits   Keep a sleep diary to help you and your health care provider figure out what could be causing your insomnia. Write down: ? When you sleep. ? When you wake up during the night. ? How well you sleep. ? How rested you feel the next day. ? Any side effects of medicines you are taking. ? What you eat and drink.  Make your bedroom a dark, comfortable place where it is easy to fall asleep. ? Put up shades or blackout curtains to block light from outside. ? Use a white noise machine to block noise. ? Keep the temperature cool.  Limit screen use before bedtime. This includes: ? Watching TV. ? Using your smartphone, tablet, or computer.  Stick to a routine that includes going to bed and waking up at the same times every day and night. This can help you fall asleep faster. Consider making a quiet activity, such as reading, part of your nighttime routine.  Try to avoid taking naps during the day so  that you sleep better at night.  Get out of bed if you are still awake after 15 minutes of trying to sleep. Keep the lights down, but try reading or doing a quiet activity. When you feel sleepy, go back to bed. General instructions  Take over-the-counter and prescription medicines only as told by your health care provider.  Exercise regularly, as told by your health care provider. Avoid exercise starting several hours before bedtime.  Use relaxation techniques to manage stress. Ask your health care provider to suggest some techniques that may work well for you. These may include: ? Breathing exercises. ? Routines to release muscle tension. ? Visualizing peaceful scenes.  Make sure that you drive carefully. Avoid driving if you feel very sleepy.  Keep all follow-up visits as told by your health care provider. This is important. Contact a health care provider if:  You are tired throughout the day.  You have trouble in your daily routine due to sleepiness.  You continue to have sleep problems, or your sleep problems get worse. Get help right away if:  You have serious thoughts about hurting yourself or someone else. If you ever feel like you may hurt yourself or others, or have thoughts about taking your own life, get help right away. You can go to your nearest emergency department or call:  Your local emergency services (911 in the [place]  A suicide crisis helpline, such as the [place] at 256-041-7031. This is open 24 hours a day. Summary  Insomnia is a sleep disorder that makes it difficult to fall asleep or stay asleep.  Insomnia can be long-term (chronic) or short-term (acute).  Treatment for insomnia depends on the cause. Treatment may focus on treating an underlying condition that is causing insomnia.  Keep a sleep diary to help you and your health care provider figure out what could be causing your insomnia. This information is not intended  to replace advice given to you by your health care provider. Make sure you discuss any questions you have with your health care provider. Document Released: 12/22/1999 Document Revised: 10/03/2016 Document Reviewed: 10/03/2016 Elsevier Interactive Patient Education  2019 [employer].

## 2018-02-26 ENCOUNTER — Ambulatory Visit: Payer: Medicare Other

## 2018-02-26 ENCOUNTER — Ambulatory Visit
Admission: RE | Admit: 2018-02-26 | Discharge: 2018-02-26 | Disposition: A | Discharge status: Home / Self Care | Payer: Medicare Other | Source: Ambulatory Visit | Attending: Radiation Oncology | Admitting: Radiation Oncology

## 2018-02-26 DIAGNOSIS — Q6410 Exstrophy of urinary bladder, unspecified: Secondary | ICD-10-CM | POA: Diagnosis not present

## 2018-02-26 DIAGNOSIS — N182 Chronic kidney disease, stage 2 (mild): Secondary | ICD-10-CM | POA: Diagnosis not present

## 2018-02-26 DIAGNOSIS — Z510 Encounter for antineoplastic radiation therapy: Secondary | ICD-10-CM | POA: Diagnosis not present

## 2018-02-26 DIAGNOSIS — I129 Hypertensive chronic kidney disease with stage 1 through stage 4 chronic kidney disease, or unspecified chronic kidney disease: Secondary | ICD-10-CM | POA: Diagnosis not present

## 2018-02-26 DIAGNOSIS — I2510 Atherosclerotic heart disease of native coronary artery without angina pectoris: Secondary | ICD-10-CM | POA: Diagnosis not present

## 2018-02-26 DIAGNOSIS — C678 Malignant neoplasm of overlapping sites of bladder: Secondary | ICD-10-CM | POA: Diagnosis not present

## 2018-02-26 DIAGNOSIS — C679 Malignant neoplasm of bladder, unspecified: Secondary | ICD-10-CM | POA: Diagnosis not present

## 2018-02-27 ENCOUNTER — Ambulatory Visit: Payer: Medicare Other

## 2018-03-02 ENCOUNTER — Ambulatory Visit: Payer: Medicare Other

## 2018-03-02 ENCOUNTER — Other Ambulatory Visit: Payer: Self-pay

## 2018-03-02 ENCOUNTER — Ambulatory Visit
Admission: RE | Admit: 2018-03-02 | Discharge: 2018-03-02 | Disposition: A | Discharge status: Home / Self Care | Payer: Medicare Other | Source: Ambulatory Visit | Attending: Radiation Oncology | Admitting: Radiation Oncology

## 2018-03-02 DIAGNOSIS — Q6410 Exstrophy of urinary bladder, unspecified: Secondary | ICD-10-CM | POA: Diagnosis not present

## 2018-03-02 DIAGNOSIS — I2510 Atherosclerotic heart disease of native coronary artery without angina pectoris: Secondary | ICD-10-CM | POA: Diagnosis not present

## 2018-03-02 DIAGNOSIS — C678 Malignant neoplasm of overlapping sites of bladder: Secondary | ICD-10-CM | POA: Diagnosis not present

## 2018-03-02 DIAGNOSIS — C679 Malignant neoplasm of bladder, unspecified: Secondary | ICD-10-CM | POA: Diagnosis not present

## 2018-03-02 DIAGNOSIS — I129 Hypertensive chronic kidney disease with stage 1 through stage 4 chronic kidney disease, or unspecified chronic kidney disease: Secondary | ICD-10-CM | POA: Diagnosis not present

## 2018-03-02 DIAGNOSIS — Z510 Encounter for antineoplastic radiation therapy: Secondary | ICD-10-CM | POA: Diagnosis not present

## 2018-03-02 DIAGNOSIS — N182 Chronic kidney disease, stage 2 (mild): Secondary | ICD-10-CM | POA: Diagnosis not present

## 2018-03-02 MED ORDER — ROSUVASTATIN CALCIUM 40 MG PO TABS: 40.0000 mg | ORAL_TABLET | Freq: Every day | ORAL | 4 refills | Status: DC

## 2018-03-03 ENCOUNTER — Ambulatory Visit
Admission: RE | Admit: 2018-03-03 | Discharge: 2018-03-03 | Disposition: A | Discharge status: Home / Self Care | Payer: Medicare Other | Source: Ambulatory Visit | Attending: Radiation Oncology | Admitting: Radiation Oncology

## 2018-03-03 DIAGNOSIS — Z510 Encounter for antineoplastic radiation therapy: Secondary | ICD-10-CM | POA: Diagnosis not present

## 2018-03-03 DIAGNOSIS — N182 Chronic kidney disease, stage 2 (mild): Secondary | ICD-10-CM | POA: Diagnosis not present

## 2018-03-03 DIAGNOSIS — Q6410 Exstrophy of urinary bladder, unspecified: Secondary | ICD-10-CM | POA: Diagnosis not present

## 2018-03-03 DIAGNOSIS — C678 Malignant neoplasm of overlapping sites of bladder: Secondary | ICD-10-CM | POA: Diagnosis not present

## 2018-03-03 DIAGNOSIS — I2510 Atherosclerotic heart disease of native coronary artery without angina pectoris: Secondary | ICD-10-CM | POA: Diagnosis not present

## 2018-03-03 DIAGNOSIS — C679 Malignant neoplasm of bladder, unspecified: Secondary | ICD-10-CM | POA: Diagnosis not present

## 2018-03-03 DIAGNOSIS — I129 Hypertensive chronic kidney disease with stage 1 through stage 4 chronic kidney disease, or unspecified chronic kidney disease: Secondary | ICD-10-CM | POA: Diagnosis not present

## 2018-03-06 ENCOUNTER — Encounter: Payer: Self-pay | Admitting: Radiation Oncology

## 2018-03-06 NOTE — Progress Notes (Signed)
  Radiation Oncology         [phone]) [phone] ________________________________  Name: Andrew Joyce MRN: [id number]  Date: 03/06/2018  DOB: 1960/02/15  End of Treatment Note  Diagnosis:   58 y.o. male with stage IVa High Grade, T2 urothelial carcinoma of the bladder with sarcomatoid features, locally advancingwith suprapubic skin erosiondespite systemic treatment     Indication for treatment:  Palliative       Radiation treatment dates:   01/01/2018 - 03/03/2018  Site/dose:   Total of 64.8 Gy 1. Bladder / 45 Gy delivered in 25 fractions of 1.8 Gy 2. Boost / 19.8 Gy delivered in 11 fractions of 1.8 Gy  Beams/energy:    1. 3D, photons / 15X 2. Complex Isodose, photons / 15X, 6X  Narrative: The patient tolerated radiation treatment relatively well. Patient with nephrostomy tubes which continued to drain appropriately throughout treatment. He reported nausea and vomiting around week 3 that resolved by the end of treatment. He denied any hematuria or issues with his bowels. He reported mild pain/discomfort around his nephrostomy tube site, fatigue, and occasional dysuria.  Plan: The patient has completed radiation treatment. The patient will return to radiation oncology clinic for routine followup in one month. I advised him to call or return sooner if he has any questions or concerns related to his recovery or treatment. ________________________________  Sheral Apley. Tammi Klippel, M.D.   This document serves as a record of services personally performed by Tyler Pita, MD. It was created on his behalf by Wilburn Mylar, a trained medical scribe. The creation of this record is based on the scribe's personal observations and the provider's statements to them. This document has been checked and approved by the attending provider.

## 2018-03-11 DIAGNOSIS — G893 Neoplasm related pain (acute) (chronic): Secondary | ICD-10-CM | POA: Diagnosis not present

## 2018-03-11 DIAGNOSIS — G894 Chronic pain syndrome: Secondary | ICD-10-CM | POA: Diagnosis not present

## 2018-03-11 DIAGNOSIS — N3289 Other specified disorders of bladder: Secondary | ICD-10-CM | POA: Diagnosis not present

## 2018-03-16 ENCOUNTER — Inpatient Hospital Stay: Payer: Medicare Other | Attending: Oncology

## 2018-03-21 ENCOUNTER — Emergency Department (HOSPITAL_COMMUNITY): Payer: Medicare Other

## 2018-03-21 ENCOUNTER — Encounter (HOSPITAL_COMMUNITY): Payer: Self-pay

## 2018-03-21 ENCOUNTER — Inpatient Hospital Stay (HOSPITAL_COMMUNITY)
Admission: EM | Admit: 2018-03-21 | Discharge: 2018-03-28 | DRG: 698 | Disposition: A | Discharge status: Home / Self Care | Payer: Medicare Other | Attending: Internal Medicine | Admitting: Internal Medicine

## 2018-03-21 ENCOUNTER — Other Ambulatory Visit: Payer: Self-pay

## 2018-03-21 DIAGNOSIS — G8929 Other chronic pain: Secondary | ICD-10-CM | POA: Diagnosis present

## 2018-03-21 DIAGNOSIS — Y732 Prosthetic and other implants, materials and accessory gastroenterology and urology devices associated with adverse incidents: Secondary | ICD-10-CM | POA: Diagnosis present

## 2018-03-21 DIAGNOSIS — R509 Fever, unspecified: Secondary | ICD-10-CM | POA: Diagnosis not present

## 2018-03-21 DIAGNOSIS — E1122 Type 2 diabetes mellitus with diabetic chronic kidney disease: Secondary | ICD-10-CM | POA: Diagnosis present

## 2018-03-21 DIAGNOSIS — I129 Hypertensive chronic kidney disease with stage 1 through stage 4 chronic kidney disease, or unspecified chronic kidney disease: Secondary | ICD-10-CM | POA: Diagnosis present

## 2018-03-21 DIAGNOSIS — C689 Malignant neoplasm of urinary organ, unspecified: Secondary | ICD-10-CM | POA: Diagnosis present

## 2018-03-21 DIAGNOSIS — F329 Major depressive disorder, single episode, unspecified: Secondary | ICD-10-CM | POA: Diagnosis present

## 2018-03-21 DIAGNOSIS — N183 Chronic kidney disease, stage 3 unspecified: Secondary | ICD-10-CM

## 2018-03-21 DIAGNOSIS — T83092A Other mechanical complication of nephrostomy catheter, initial encounter: Principal | ICD-10-CM | POA: Diagnosis present

## 2018-03-21 DIAGNOSIS — E039 Hypothyroidism, unspecified: Secondary | ICD-10-CM | POA: Diagnosis present

## 2018-03-21 DIAGNOSIS — Z87440 Personal history of urinary (tract) infections: Secondary | ICD-10-CM

## 2018-03-21 DIAGNOSIS — T83022A Displacement of nephrostomy catheter, initial encounter: Secondary | ICD-10-CM | POA: Diagnosis not present

## 2018-03-21 DIAGNOSIS — N136 Pyonephrosis: Secondary | ICD-10-CM | POA: Diagnosis present

## 2018-03-21 DIAGNOSIS — R000 Tachycardia, unspecified: Secondary | ICD-10-CM | POA: Diagnosis not present

## 2018-03-21 DIAGNOSIS — Z79899 Other long term (current) drug therapy: Secondary | ICD-10-CM

## 2018-03-21 DIAGNOSIS — N1339 Other hydronephrosis: Secondary | ICD-10-CM

## 2018-03-21 DIAGNOSIS — N179 Acute kidney failure, unspecified: Secondary | ICD-10-CM | POA: Diagnosis present

## 2018-03-21 DIAGNOSIS — A4159 Other Gram-negative sepsis: Secondary | ICD-10-CM | POA: Diagnosis present

## 2018-03-21 DIAGNOSIS — R109 Unspecified abdominal pain: Secondary | ICD-10-CM

## 2018-03-21 DIAGNOSIS — Z85528 Personal history of other malignant neoplasm of kidney: Secondary | ICD-10-CM

## 2018-03-21 DIAGNOSIS — I2510 Atherosclerotic heart disease of native coronary artery without angina pectoris: Secondary | ICD-10-CM | POA: Diagnosis present

## 2018-03-21 DIAGNOSIS — Z87442 Personal history of urinary calculi: Secondary | ICD-10-CM

## 2018-03-21 DIAGNOSIS — E785 Hyperlipidemia, unspecified: Secondary | ICD-10-CM | POA: Diagnosis present

## 2018-03-21 DIAGNOSIS — Q6410 Exstrophy of urinary bladder, unspecified: Secondary | ICD-10-CM

## 2018-03-21 DIAGNOSIS — N12 Tubulo-interstitial nephritis, not specified as acute or chronic: Secondary | ICD-10-CM | POA: Diagnosis present

## 2018-03-21 DIAGNOSIS — T83098A Other mechanical complication of other indwelling urethral catheter, initial encounter: Secondary | ICD-10-CM

## 2018-03-21 DIAGNOSIS — Z8249 Family history of ischemic heart disease and other diseases of the circulatory system: Secondary | ICD-10-CM

## 2018-03-21 DIAGNOSIS — Z859 Personal history of malignant neoplasm, unspecified: Secondary | ICD-10-CM | POA: Diagnosis not present

## 2018-03-21 DIAGNOSIS — D631 Anemia in chronic kidney disease: Secondary | ICD-10-CM | POA: Diagnosis present

## 2018-03-21 DIAGNOSIS — K632 Fistula of intestine: Secondary | ICD-10-CM | POA: Diagnosis present

## 2018-03-21 DIAGNOSIS — M109 Gout, unspecified: Secondary | ICD-10-CM | POA: Diagnosis present

## 2018-03-21 DIAGNOSIS — Z791 Long term (current) use of non-steroidal anti-inflammatories (NSAID): Secondary | ICD-10-CM

## 2018-03-21 DIAGNOSIS — Z79890 Hormone replacement therapy (postmenopausal): Secondary | ICD-10-CM

## 2018-03-21 DIAGNOSIS — D649 Anemia, unspecified: Secondary | ICD-10-CM

## 2018-03-21 DIAGNOSIS — N322 Vesical fistula, not elsewhere classified: Secondary | ICD-10-CM | POA: Diagnosis present

## 2018-03-21 DIAGNOSIS — I252 Old myocardial infarction: Secondary | ICD-10-CM | POA: Diagnosis not present

## 2018-03-21 DIAGNOSIS — N1330 Unspecified hydronephrosis: Secondary | ICD-10-CM | POA: Diagnosis not present

## 2018-03-21 DIAGNOSIS — B961 Klebsiella pneumoniae [K. pneumoniae] as the cause of diseases classified elsewhere: Secondary | ICD-10-CM | POA: Diagnosis present

## 2018-03-21 DIAGNOSIS — Z923 Personal history of irradiation: Secondary | ICD-10-CM

## 2018-03-21 DIAGNOSIS — Z8349 Family history of other endocrine, nutritional and metabolic diseases: Secondary | ICD-10-CM

## 2018-03-21 DIAGNOSIS — Z951 Presence of aortocoronary bypass graft: Secondary | ICD-10-CM | POA: Diagnosis not present

## 2018-03-21 DIAGNOSIS — F419 Anxiety disorder, unspecified: Secondary | ICD-10-CM | POA: Diagnosis present

## 2018-03-21 DIAGNOSIS — R102 Pelvic and perineal pain: Secondary | ICD-10-CM | POA: Diagnosis not present

## 2018-03-21 DIAGNOSIS — E119 Type 2 diabetes mellitus without complications: Secondary | ICD-10-CM

## 2018-03-21 DIAGNOSIS — A419 Sepsis, unspecified organism: Secondary | ICD-10-CM

## 2018-03-21 LAB — COMPREHENSIVE METABOLIC PANEL
ALT: 10 U/L (ref 0–44)
AST: 16 U/L (ref 15–41)
Albumin: 3.4 g/dL — ABNORMAL LOW (ref 3.5–5.0)
Alkaline Phosphatase: 56 U/L (ref 38–126)
Anion gap: 10 (ref 5–15)
BUN: 28 mg/dL — ABNORMAL HIGH (ref 6–20)
CO2: 26 mmol/L (ref 22–32)
Calcium: 9.5 mg/dL (ref 8.9–10.3)
Chloride: 103 mmol/L (ref 98–111)
Creatinine, Ser: 1.56 mg/dL — ABNORMAL HIGH (ref 0.61–1.24)
GFR calc Af Amer: 56 mL/min — ABNORMAL LOW (ref >60)
GFR calc non Af Amer: 48 mL/min — ABNORMAL LOW (ref >60)
GLUCOSE: 168 mg/dL — AB (ref 70–99)
Potassium: 4.2 mmol/L (ref 3.5–5.1)
Sodium: 139 mmol/L (ref 135–145)
TOTAL PROTEIN: 7.8 g/dL (ref 6.5–8.1)
Total Bilirubin: 0.3 mg/dL (ref 0.3–1.2)

## 2018-03-21 LAB — CBC WITH DIFFERENTIAL/PLATELET
Abs Immature Granulocytes: 0.02 10*3/uL (ref 0.00–0.07)
BASOS ABS: 0 10*3/uL (ref 0.0–0.1)
Basophils Relative: 0 %
Eosinophils Absolute: 0 10*3/uL (ref 0.0–0.5)
Eosinophils Relative: 1 %
HEMATOCRIT: 25.6 % — AB (ref 39.0–52.0)
Hemoglobin: 7.5 g/dL — ABNORMAL LOW (ref 13.0–17.0)
Immature Granulocytes: 0 %
LYMPHS ABS: 0.4 10*3/uL — AB (ref 0.7–4.0)
Lymphocytes Relative: 7 %
MCH: 28.7 pg (ref 26.0–34.0)
MCHC: 29.3 g/dL — ABNORMAL LOW (ref 30.0–36.0)
MCV: 98.1 fL (ref 80.0–100.0)
Monocytes Absolute: 0.5 10*3/uL (ref 0.1–1.0)
Monocytes Relative: 7 %
Neutro Abs: 5.7 10*3/uL (ref 1.7–7.7)
Neutrophils Relative %: 85 %
Platelets: 219 10*3/uL (ref 150–400)
RBC: 2.61 MIL/uL — ABNORMAL LOW (ref 4.22–5.81)
RDW: 19.6 % — ABNORMAL HIGH (ref 11.5–15.5)
WBC: 6.6 10*3/uL (ref 4.0–10.5)
nRBC: 0 % (ref 0.0–0.2)

## 2018-03-21 LAB — URINALYSIS, ROUTINE W REFLEX MICROSCOPIC
Bilirubin Urine: NEGATIVE
Glucose, UA: NEGATIVE mg/dL
KETONES UR: NEGATIVE mg/dL
Nitrite: NEGATIVE
Protein, ur: 30 mg/dL — AB
Specific Gravity, Urine: 1.01 (ref 1.005–1.030)
WBC, UA: >50 WBC/hpf — ABNORMAL HIGH (ref 0–5)
pH: 9 — ABNORMAL HIGH (ref 5.0–8.0)

## 2018-03-21 LAB — PROTIME-INR
INR: 1.1 (ref 0.8–1.2)
Prothrombin Time: 14.5 seconds (ref 11.4–15.2)

## 2018-03-21 LAB — LACTIC ACID, PLASMA: Lactic Acid, Venous: 1.9 mmol/L (ref 0.5–1.9)

## 2018-03-21 MED ORDER — ACETAMINOPHEN 325 MG PO TABS
650.0000 mg | ORAL_TABLET | Freq: Four times a day (QID) | ORAL | Status: DC | PRN
  Administered 2018-03-22: 650 mg via ORAL
  Filled 2018-03-21: qty 2

## 2018-03-21 MED ORDER — SODIUM CHLORIDE 0.9 % IV BOLUS
1000.0000 mL | Freq: Once | INTRAVENOUS | Status: AC
  Administered 2018-03-21: 1000 mL via INTRAVENOUS

## 2018-03-21 MED ORDER — MORPHINE SULFATE (PF) 2 MG/ML IV SOLN
1.0000 mg | INTRAVENOUS | Status: DC | PRN
  Administered 2018-03-21 – 2018-03-26 (×10): 1 mg via INTRAVENOUS
  Filled 2018-03-21 (×10): qty 1

## 2018-03-21 MED ORDER — DULOXETINE HCL 20 MG PO CPEP
20.0000 mg | ORAL_CAPSULE | Freq: Every day | ORAL | Status: DC
  Administered 2018-03-21 – 2018-03-28 (×7): 20 mg via ORAL
  Filled 2018-03-21 (×7): qty 1

## 2018-03-21 MED ORDER — ROSUVASTATIN CALCIUM 20 MG PO TABS
40.0000 mg | ORAL_TABLET | Freq: Every day | ORAL | Status: DC
  Administered 2018-03-22 – 2018-03-28 (×7): 40 mg via ORAL
  Filled 2018-03-21 (×8): qty 2

## 2018-03-21 MED ORDER — ACETAMINOPHEN 325 MG PO TABS
650.0000 mg | ORAL_TABLET | Freq: Once | ORAL | Status: AC | PRN
  Administered 2018-03-21: 650 mg via ORAL
  Filled 2018-03-21: qty 2

## 2018-03-21 MED ORDER — SODIUM CHLORIDE 0.9 % IV SOLN
2.0000 g | Freq: Once | INTRAVENOUS | Status: AC
  Administered 2018-03-21: 2 g via INTRAVENOUS
  Filled 2018-03-21: qty 2

## 2018-03-21 MED ORDER — ACETAMINOPHEN 650 MG RE SUPP: 650.0000 mg | Freq: Four times a day (QID) | RECTAL | Status: DC | PRN

## 2018-03-21 MED ORDER — SODIUM CHLORIDE 0.9 % IV BOLUS
500.0000 mL | Freq: Once | INTRAVENOUS | Status: AC
  Administered 2018-03-21: 500 mL via INTRAVENOUS

## 2018-03-21 MED ORDER — ROSUVASTATIN CALCIUM 40 MG PO TABS
40.0000 mg | ORAL_TABLET | Freq: Every day | ORAL | Status: DC
  Administered 2018-03-21: 40 mg via ORAL
  Filled 2018-03-21: qty 1

## 2018-03-21 MED ORDER — LEVOTHYROXINE SODIUM 25 MCG PO TABS
25.0000 ug | ORAL_TABLET | Freq: Every day | ORAL | Status: DC
  Administered 2018-03-22 – 2018-03-28 (×6): 25 ug via ORAL
  Filled 2018-03-21 (×6): qty 1

## 2018-03-21 MED ORDER — FENOFIBRATE 160 MG PO TABS
160.0000 mg | ORAL_TABLET | Freq: Every day | ORAL | Status: DC
  Administered 2018-03-21 – 2018-03-28 (×8): 160 mg via ORAL
  Filled 2018-03-21 (×8): qty 1

## 2018-03-21 MED ORDER — CARBAMAZEPINE 200 MG PO TABS
200.0000 mg | ORAL_TABLET | Freq: Two times a day (BID) | ORAL | Status: DC
  Administered 2018-03-21 – 2018-03-28 (×14): 200 mg via ORAL
  Filled 2018-03-21 (×14): qty 1

## 2018-03-21 MED ORDER — ALLOPURINOL 100 MG PO TABS
100.0000 mg | ORAL_TABLET | Freq: Every day | ORAL | Status: DC
  Administered 2018-03-21 – 2018-03-28 (×8): 100 mg via ORAL
  Filled 2018-03-21 (×8): qty 1

## 2018-03-21 MED ORDER — ROPINIROLE HCL 0.25 MG PO TABS
0.2500 mg | ORAL_TABLET | Freq: Every day | ORAL | Status: DC
  Administered 2018-03-21 – 2018-03-28 (×7): 0.25 mg via ORAL
  Filled 2018-03-21 (×8): qty 1

## 2018-03-21 MED ORDER — INSULIN ASPART 100 UNIT/ML ~~LOC~~ SOLN: 0.0000 [IU] | Freq: Three times a day (TID) | SUBCUTANEOUS | Status: DC

## 2018-03-21 MED ORDER — BACLOFEN 20 MG PO TABS
20.0000 mg | ORAL_TABLET | Freq: Three times a day (TID) | ORAL | Status: DC
  Administered 2018-03-21 – 2018-03-28 (×20): 20 mg via ORAL
  Filled 2018-03-21 (×20): qty 1

## 2018-03-21 MED ORDER — SODIUM CHLORIDE 0.9% FLUSH: 3.0000 mL | Freq: Once | INTRAVENOUS | Status: DC

## 2018-03-21 NOTE — ED Notes (Signed)
Pt cleaned due to incont of urine, pt able to stand for bed to be changed, he will be admitted here.

## 2018-03-21 NOTE — ED Provider Notes (Signed)
[place] DEPT Provider Note   CSN: [id number] Arrival date & time: 03/21/18  1436    History   Chief Complaint Chief Complaint  Patient presents with   Fever   Nephrostomy Tube clogged    HPI Shykeem Resurreccion is a 58 y.o. male with history of CAD s/op CABG, NSTEMI, nephrolithiasis, high-grade urothelial renal carcinoma, bilateral nephrostomy tubes status post radiation October 2019 with POD on imaging, recurrent nephrostomy tube malfunction, chronic pain, constipation, is here for evaluation of fever.  Onset this morning.  Associated with increasing, sudden, severe left flank pain that began last night.  Also notes this morning he noticed less urine output from his nephrostomy tubes.  Last took his oxycodone last night without significant relief.  No alleviating or aggravating factors.  Pt has chronic flank and bladder pain, managed by pain clinic, but states left flank pain is significantly worse since last night. Wife reports multiple nephrostomy tube malfunctions that have required replacement but never with a fever.  He denies any other infectious symptoms such as headache, URI symptoms, cough, new or worsening abdominal pain, changes to bowel movements.  Has not noticed any cloudiness, odor, blood in his urine output.  His primary  Oncology and urology teams are through [facility].     HPI  Past Medical History:  Diagnosis Date   Bladder exstrophy     Congenital- had multiple surgeries as a child   Chronic kidney disease (CKD), stage II (mild)    Congenital birth defect    Coronary artery disease    a. s/p CABG in 2008; b.  s/p cath in 2011 for med rx.;  c.  NSTEMI (02/2011):  LHC (02/2011):  LAD occluded, distal LAD filled via left to left collaterals, distal CFX 40-50%, proximal RCA occluded (right to right collaterals), distal RCA occluded, S-RCA occluded proximally, S-OM1/OM2 occluded (new from 2011 - culprit), L-LAD ok, ant apical HK, EF  45-50% - med Rx.   Dyslipidemia    History of renal stone    Hx of cardiovascular stress test    Nuclear (06/2009):  EF 71%, small area of infarct/ischemia in inferior wall-unchanged from prior   Hx of echocardiogram    Echo (05/2013):  EF 55-60%, inf HK, mild AI, normal RVSF, RVSP 28 mmHg   Hypertension    Pancreatitis    Recurrent UTI     Patient Active Problem List   Diagnosis Date Noted   Insomnia 02/25/2018   Port-A-Cath in place 09/23/2017   Malignant neoplasm of urinary bladder ([place]) 09/04/2017   Diabetes mellitus ([place]) 08/08/2017   Healthcare maintenance 01/21/2017   Benign fasciculation-cramp syndrome 08/22/2015   Chronic thoracic back pain 03/20/2015   Fasciculation of lower extremity 11/18/2014   Bilateral thoracic back pain 09/28/2014   Nonallopathic lesion-rib cage 07/14/2014   Nonallopathic lesion of thoracic region 07/14/2014   Nonallopathic lesion of cervical region 07/14/2014   Posture imbalance 04/13/2014   Chronic kidney disease, stage 3 ([place]) 06/01/2013   Angina pectoris ([place]) 09/18/2011   Dyspnea 06/13/2011   NSTEMI (non-ST elevated myocardial infarction) ([place]) 02/27/2011   CAD (coronary artery disease) 02/06/2011   HTN (hypertension) 02/06/2011   Hyperlipidemia 02/06/2011    Past Surgical History:  Procedure Laterality Date   CARDIAC CATHETERIZATION  07/28/2009   EF 60%; Grafts patent except for SVG to the AM and PD. Native RCA is occluded.   CARDIAC CATHETERIZATION  03/20/2006   EF 60-65%   CARDIOVASCULAR STRESS TEST  06/12/2009  EF 71%, SMALL AREA OF INFARCT/ISCHEMIA IN INFERIOR WALL; FELT TO NOT BE CHANGED.   CORONARY ARTERY BYPASS GRAFT  03/2006   LIMA GRAFT TO LAD, SAPHENOUS VEIN GRAFT TO THE ACUTE MARGINAL BRANCH THE RIGHT CORONARY, SAPHENOUS VEIN GRAFT TO THE PDA, SEQUENTIAL VEIN GRAFT TO THE FIRST AND SECOND OBTUSE MARGINAL VESSELS   IR IMAGING GUIDED PORT INSERTION  09/19/2017   KIDNEY SURGERY     LEFT HEART  CATHETERIZATION WITH CORONARY ANGIOGRAM N/A 02/26/2011   Procedure: LEFT HEART CATHETERIZATION WITH CORONARY ANGIOGRAM;  Surgeon: Thayer Headings, MD;  Location: [facility] CATH LAB;  Service: Cardiovascular;  Laterality: N/A;        Home Medications    Prior to Admission medications   Medication Sig Start Date End Date Taking? Authorizing Provider  allopurinol (ZYLOPRIM) 100 MG tablet Take 1 tablet (100 mg total) by mouth daily. 08/08/16  Yes Lyndal Pulley, DO  amLODipine (NORVASC) 5 MG tablet Take 1 tablet (5 mg total) by mouth daily. 09/17/13  Yes Martinique, Peter M, MD  amoxicillin (AMOXIL) 250 MG capsule Take 250 mg by mouth daily. 08/13/17  Yes [provider]  baclofen (LIORESAL) 10 MG tablet TAKE 2 TABLETS BY MOUTH THREE TIMES DAILY Patient taking differently: Take 20 mg by mouth 3 (three) times daily.  02/03/18  Yes Patel, Donika K, DO  carbamazepine (TEGRETOL) 200 MG tablet TAKE 1 TABLET BY MOUTH TWICE DAILY Patient taking differently: Take 200 mg by mouth 2 (two) times daily.  08/18/17  Yes Patel, Donika K, DO  DULoxetine (CYMBALTA) 20 MG capsule Take 20 mg by mouth at bedtime. For Sleep Aide 09/17/17  Yes [provider]  fenofibrate 160 MG tablet TAKE 1 TABLET BY MOUTH ONCE DAILY Patient taking differently: Take 160 mg by mouth daily.  10/14/17  Yes Martinique, Peter M, MD  fentaNYL (DURAGESIC - DOSED MCG/HR) 12 MCG/HR Place onto the skin. 12/08/17  Yes [provider]  gabapentin (NEURONTIN) 300 MG capsule Take 300 mg by mouth at bedtime as needed for pain. 03/11/18  Yes [provider]  glucose blood (ACCU-CHEK GUIDE) test strip Use to check blood sugars 2 times daily. Dx Code: E11.9 08/11/17  Yes [name], NP  isosorbide mononitrate (IMDUR) 60 MG 24 hr tablet TAKE 1 & 1/2 (ONE & ONE-HALF) TABLETS BY MOUTH ONCE DAILY Patient taking differently: Take 60-90 mg by mouth daily.  06/11/17  Yes Martinique, Peter M, MD  JANUVIA 50 MG tablet TAKE 1 TABLET BY MOUTH ONCE  DAILY 01/13/18  Yes [name], NP  levothyroxine (SYNTHROID, LEVOTHROID) 25 MCG tablet Take 1 tablet (25 mcg total) by mouth daily. 08/08/16  Yes Lyndal Pulley, DO  lidocaine-prilocaine (EMLA) cream Apply 1 application topically as needed. 09/04/17  Yes Wyatt Portela, MD  losartan (COZAAR) 100 MG tablet TAKE 1 TABLET BY MOUTH ONCE DAILY 12/10/17  Yes [place]ique, Peter M, MD  meloxicam (MOBIC) 15 MG tablet Take 1 tablet (15 mg total) by mouth daily. 10/29/16  Yes Gardiner Barefoot, DPM  metoprolol tartrate (LOPRESSOR) 50 MG tablet Take 1 tablet (50 mg total) by mouth 2 (two) times daily. 09/09/17  Yes [place], Peter M, MD  nitroGLYCERIN (NITROSTAT) 0.4 MG SL tablet Place 1 tablet (0.4 mg total) under the tongue every 5 (five) minutes as needed. For chest pain 06/20/16  Yes [place]ique, Peter M, MD  Oxycodone HCl 10 MG TABS Take 10 mg by mouth daily.  12/08/17  Yes [provider]  rOPINIRole (REQUIP)  0.25 MG tablet TAKE 1 TABLET BY MOUTH 3 HOURS BEFORE BEDTIME Patient taking differently: Take 0.25 mg by mouth at bedtime. TAKE 1 TABLET BY MOUTH 3 HOURS BEFORE BEDTIME 11/03/17  Yes Patel, Donika K, DO  rosuvastatin (CRESTOR) 40 MG tablet Take 1 tablet (40 mg total) by mouth daily. 03/02/18 05/31/18 Yes [place]ique, Peter M, MD  sodium chloride flush 0.9 % SOLN injection Flush 10 mL of saline into each nephrostomy tube daily. 11/28/17  Yes [provider]  methenamine (MANDELAMINE) 1 g tablet Take 1,000 mg by mouth 3 (three) times daily.  12/21/15   [provider]  prochlorperazine (COMPAZINE) 10 MG tablet TAKE 1 TABLET BY MOUTH EVERY 6 HOURS AS NEEDED FOR NAUSEA FOR VOMITING Patient not taking: Reported on 03/21/2018 02/05/18   Bruning, Ashlyn, PA-C  prochlorperazine (COMPAZINE) 10 MG tablet TAKE 1 TABLET BY MOUTH EVERY 6 HOURS AS NEEDED FOR NAUSEA OR FOR VOMITING Patient not taking: Reported on 03/21/2018 02/05/18   Bruning, Ashlyn, PA-C  traZODone (DESYREL) 50 MG tablet TAKE 1/2 TO 1  (ONE-HALF TO ONE) TABLET BY MOUTH AT BEDTIME AS NEEDED FOR SLEEP Patient not taking: Reported on 02/25/2018 12/17/17   [name], NP    Family History Family History  Problem Relation Age of Onset   Hyperlipidemia Mother        Living, 55   Hypertension Mother    Hyperlipidemia Father        Living 65   Hypertension Father    Healthy Sister    Healthy Brother     Social History Social History   Tobacco Use   Smoking status: Never Smoker   Smokeless tobacco: Never Used  Substance Use Topics   Alcohol use: No    Alcohol/week: 0.0 standard drinks   Drug use: No     Allergies   Patient has no known allergies.   Review of Systems Review of Systems  Constitutional: Positive for fever.  Genitourinary: Positive for flank pain.   Physical Exam Updated Vital Signs BP 103/67 Comment: Temp 102.1 Orally   Pulse 95 Comment: Temp 102.1 Orally   Temp (!) 103.2 F (39.6 C) (Oral)    Resp 19 Comment: Temp 102.1 Orally   SpO2 98% Comment: Temp 102.1 Orally  Physical Exam Vitals signs and nursing note reviewed.  Constitutional:      Appearance: He is well-developed.     Comments: Nontoxic.  HENT:     Head: Normocephalic and atraumatic.     Nose: Nose normal.  Eyes:     Conjunctiva/sclera: Conjunctivae normal.     Pupils: Pupils are equal, round, and reactive to light.  Neck:     Musculoskeletal: Normal range of motion.  Cardiovascular:     Rate and Rhythm: Normal rate and regular rhythm.     Heart sounds: Normal heart sounds.     Comments: 1+ radial and DP pulses bilaterally  Pulmonary:     Effort: Pulmonary effort is normal.     Breath sounds: Normal breath sounds.  Abdominal:     General: Bowel sounds are normal.     Palpations: Abdomen is soft.     Tenderness: There is abdominal tenderness.     Comments: Left CVA tenderness.  No right CVA tenderness.  Mild suprapubic tenderness with mild guarding.  No rigidity.  No distention.  Negative Murphy's  and McBurney's.  Musculoskeletal: Normal range of motion.  Skin:    General: Skin is warm and dry.     Capillary  Refill: Capillary refill takes less than 2 seconds.     Comments: Skin to the suprapubic area with mild erythema, breakdown.  Patient states this has significantly improved since radiation.  Skin around nephrostomy tubes flanks without erythema, drainage, warmth, tenderness.  Neurological:     Mental Status: He is alert and oriented to person, place, and time.  Psychiatric:        Behavior: Behavior normal.        Thought Content: Thought content normal.        Judgment: Judgment normal.        ED Treatments / Results  Labs (all labs ordered are listed, but only abnormal results are displayed) Labs Reviewed  COMPREHENSIVE METABOLIC PANEL - Abnormal; Notable for the following components:      Result Value   Glucose, Bld 168 (*)    BUN 28 (*)    Creatinine, Ser 1.56 (*)    Albumin 3.4 (*)    GFR calc non Af Amer 48 (*)    GFR calc Af Amer 56 (*)    All other components within normal limits  CBC WITH DIFFERENTIAL/PLATELET - Abnormal; Notable for the following components:   RBC 2.61 (*)    Hemoglobin 7.5 (*)    HCT 25.6 (*)    MCHC 29.3 (*)    RDW 19.6 (*)    Lymphs Abs 0.4 (*)    All other components within normal limits  URINALYSIS, ROUTINE W REFLEX MICROSCOPIC - Abnormal; Notable for the following components:   APPearance HAZY (*)    pH 9.0 (*)    Hgb urine dipstick SMALL (*)    Protein, ur 30 (*)    Leukocytes,Ua LARGE (*)    WBC, UA >50 (*)    Bacteria, UA RARE (*)    All other components within normal limits  CULTURE, BLOOD (ROUTINE X 2)  CULTURE, BLOOD (ROUTINE X 2)  URINE CULTURE  LACTIC ACID, PLASMA  PROTIME-INR  LACTIC ACID, PLASMA    EKG EKG Interpretation  Date/Time:  Saturday March 21 2018 15:33:39 EDT Ventricular Rate:  109 PR Interval:    QRS Duration: 134 QT Interval:  338 QTC Calculation: 456 R Axis:   14 Text Interpretation:   Sinus tachycardia IVCD, consider atypical LBBB Since prior ECG in 2013, IVCD is new Confirmed by Gareth Morgan [phone]) on 03/21/2018 3:37:53 PM   Radiology Ct Renal Stone Study  Result Date: 03/21/2018 CLINICAL DATA:  History of bladder exstrophy. Pelvic pain and fever. Reported obstruction of right nephrostomy catheter EXAM: CT ABDOMEN AND PELVIS WITHOUT CONTRAST TECHNIQUE: Multidetector CT imaging of the abdomen and pelvis was performed following the standard protocol without oral or IV contrast. COMPARISON:  November 15, 2015 FINDINGS: Lower chest: There is bibasilar atelectasis. There is no lung base edema or consolidation. Hepatobiliary: No focal liver lesions are appreciable on this noncontrast enhanced study. Gallbladder wall is not appreciably thickened. There is no biliary duct dilatation. Pancreas: No pancreatic mass or inflammatory focus. Spleen: No focal splenic lesions.  Small splenules noted medially. Adrenals/Urinary Tract: Adrenals bilaterally appear normal. There are bilateral nephrostomy catheters. There is severe dilatation of each collecting system, somewhat more severe on the right than on the left. A small amount of air is noted within an upper pole calyx on the left, likely secondary to the nephrostomy placement. There is a calculus in the lower pole of the left kidney measuring 7 x 5 mm. There is a 1 mm calculus in the lower pole left  kidney. There is a cyst arising from the upper pole of the right kidney medially measuring 2.7 x 1.8 cm. There is no appreciable perinephric fluid. There is persistent marked dilatation of each ureter, a stable finding. The ureters insert ectopically along the urinary bladder on each side, likely secondary to the history of bladder exstrophy. No ureteral mass or calculus is demonstrable. There is diffuse opacification in the urinary bladder, likely hemorrhage. 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 There is diffuse soft  tissue thickening in this area. Question inflammatory change versus neoplasm involving the urinary bladder. Stomach/Bowel: There is moderate stool in the colon. There is no appreciable bowel wall or mesenteric thickening. No evident bowel obstruction. There is no free air or portal venous air. Vascular/Lymphatic: There is no abdominal aortic aneurysm. There are foci of arterial vascular calcification in the aorta and proximal right common iliac artery. There is a prominent left inguinal lymph node measuring 1.4 x 1.4 cm. No other adenopathy is appreciable. Reproductive: Prostate and seminal vesicles appear grossly normal on this noncontrast enhanced study. Other: No appendiceal inflammation evident. No frank abscess or ascites appreciable in the abdomen or pelvis. Musculoskeletal: Marked widening of the pubic symphysis is noted consistent with history of bladder exstrophy. No blastic or lytic bone lesions are identified. There is disc narrowing with arthropathy at L5-S1. No intramuscular lesions are evident. IMPRESSION: 1. 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 Diffuse opacity in urinary bladder is noted as well as areas of air in the bladder, likely due to the fistula. Suspect hemorrhage within the bladder, although there may be neoplasm superimposed. Question abdominal wall thickening due to inflammation versus neoplasm. Both entities may be present. Clinical assessment of this area advised. A well-defined abscess is not seen in this area, although there does appear to be some loculated fluid in the rectus muscle immediately adjacent to the fistula. Note that inflammation extends inferiorly to the level of the base of the penis. Neoplastic involvement in this area cannot be excluded on this noncontrast enhanced study. 2. Nephrostomies catheters bilaterally. There is severe hydronephrosis despite  presence of the nephrostomy catheters. There is marked ureterectasis bilaterally with ectopic insertions of each ureter into the bladder consistent with history of exstrophy. No ureteral calculi or ureteral masses evident. There are nonobstructing calculi in the left kidney. There is no perinephric fluid on either side. 3. No evident bowel obstruction. No appreciable abscess in the abdomen or pelvis. Appendix region appears normal. 4. Prominent left inguinal lymph node. This lymph node enlargement could have either inflammatory or neoplastic etiology. 5. Marked widening of the pubic symphysis, a finding felt to be indicative of the known bladder exstrophy. 6.  Aortic atherosclerosis. Electronically Signed   By: Lowella Grip III M.D.   On: 03/21/2018 17:07    Procedures .Critical Care Performed by: Kinnie Feil, PA-C Authorized by: Kinnie Feil, PA-C   Critical care provider statement:    Critical care time (minutes):  45   Critical care was necessary to treat or prevent imminent or life-threatening deterioration of the following conditions: SIRS, emergent procedure needed.   Critical care was time spent personally by me on the following activities:  Discussions with consultants, evaluation of patient's response to treatment, examination of patient, ordering and performing treatments and interventions, ordering and review of laboratory studies, ordering and review of radiographic studies,  pulse oximetry, re-evaluation of patient's condition, obtaining history from patient or surrogate and review of old charts   I assumed direction of critical care for this patient from another provider in my specialty: no     (including critical care time)  Medications Ordered in ED Medications  sodium chloride flush (NS) 0.9 % injection 3 mL (3 mLs Intravenous Not Given 03/21/18 1731)  acetaminophen (TYLENOL) tablet 650 mg (650 mg Oral Given 03/21/18 1535)  ceFEPIme (MAXIPIME) 2 g in sodium  chloride 0.9 % 100 mL IVPB (0 g Intravenous Stopped 03/21/18 1729)  sodium chloride 0.9 % bolus 500 mL (0 mLs Intravenous Stopped 03/21/18 1600)  sodium chloride 0.9 % bolus 1,000 mL (0 mLs Intravenous Stopped 03/21/18 1730)     Initial Impression / Assessment and Plan / ED Course  I have reviewed the triage vital signs and the nursing notes.  Pertinent labs & imaging results that were available during my care of the patient were reviewed by me and considered in my medical decision making (see chart for details).  Clinical Course as of Mar 20 1848  Sat Mar 21, 2018  1652 baseline  Hemoglobin(!): 7.5 [CG]  1652 Improved from previous  Creatinine(!): 1.56 [CG]  1652 WBC: 6.6 [CG]  1653 Lactic Acid, Venous: 1.9 [CG]  1722 1. 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 Diffuse opacity in urinary bladder is noted as well as areas of air in the bladder, likely due to the fistula. Suspect hemorrhage within the bladder, although there may be neoplasm superimposed. Question abdominal wall thickening due to inflammation versus neoplasm. Both entities may be present. Clinical assessment of this area advised. A well-defined abscess is not seen in this area, although there does appear to be some loculated fluid in the rectus muscle immediately adjacent to the fistula. Note that inflammation extends inferiorly to the level of the base of the penis. Neoplastic involvement in this area cannot be excluded on this noncontrast enhanced study.  2. Nephrostomies catheters bilaterally. There is severe hydronephrosis despite presence of the nephrostomy catheters. There is marked ureterectasis bilaterally with ectopic insertions of each ureter into the bladder consistent with history of exstrophy. No ureteral calculi or ureteral masses evident. There are nonobstructing calculi in the left kidney.  There is no perinephric fluid on either side.  3. No evident bowel obstruction. No appreciable abscess in the abdomen or pelvis. Appendix region appears normal.  4. Prominent left inguinal lymph node. This lymph node enlargement could have either inflammatory or neoplastic etiology.  5. Marked widening of the pubic symphysis, a finding felt to be indicative of the known bladder exstrophy.  6. Aortic atherosclerosis  CT Renal Stone Study [CG]    Clinical Course User Index [CG] Kinnie Feil, PA-C      Highest on differential is nephrostomy tube malfunction vs superimposed pyelonephritis vs nephrolithiasis.  Doubt AAA, dissection, he has no pulse deficit, extremity weakness, CP, SOB, HD instability.  No GI or respiratory symptoms to explain fever otherwise.  No overt cellulitis around nephrostomy tubes.  He has a known bladder/abd wall fistula that pt reports has improved in appearance, he continues to leak urine through abdominal wall.  Patient is febrile and tachycardic with source likely GU system however he is nontoxic-appearing, hemodynamically stable.  We will defer sepsis code at this time.  Likely tachycardia from fever.  1800: No leukocytosis, lactic acidosis.  Baseline hemoglobin.  Creatinine has improved.  Urine and blood cultures pending.  Patient is given IV antibiotics and 1.5 L IV fluids with improvement in fever and heart rate.  I have spoken to [facility] urology who has briefly reviewed CT renal, it appears patient's hydronephrosis has worsened since previous available CT possibly from tube obstruction vs infection.  Recommending consulting patient's primary urology team.  Page to [facility] urology pending.  1848: Spoke to [facility] urology resident Bryson Ha who unfortunately unable to access patient's chart. Based on history and CT here anticipate pt will need emergent exchange of tubes, recommending IR getting involved.  Due to coronavirus [facility] is limiting transfers unless services not  available in referring hospital.  Spoke to pt and explained situation, he is agreeable to stay at [facility] for procedure/admission. He will be able to f/u with [facility] in [place] at discharge. Pending IR and hospitalist consults.  Final Clinical Impressions(s) / ED Diagnoses   Final diagnoses:  Fever in adult  Malfunction of nephrostomy tube [facility])  History of cancer    ED Discharge Orders    None       Kinnie Feil, PA-C 03/21/18 1952    Gareth Morgan, MD 03/23/18 2359

## 2018-03-21 NOTE — Progress Notes (Signed)
Pharmacy Note   A consult was received from an ED physician for cefepime per pharmacy dosing.    The patient's profile has been reviewed for ht/wt/allergies/indication/available labs.    A one time order has been placed for cefepime 2 gr IV x1 .  Further antibiotics/pharmacy consults should be ordered by admitting physician if indicated.                       Thank you,  Royetta Asal, PharmD, BCPS Pager [phone] 03/21/2018 3:37 PM

## 2018-03-21 NOTE — ED Notes (Signed)
Pt resting with family. Provider at bedside flushing nephrostomy tubes. Pt tolerating well. No acute distress at this time

## 2018-03-21 NOTE — ED Notes (Signed)
ED TO INPATIENT HANDOFF REPORT  ED Nurse Name and Phone #: Tomi Likens RN [phone]  S Name/Age/Gender Andrew Joyce 58 y.o. male Room/Bed: WA17/WA17  Code Status   Code Status: Full Code  Home/SNF/Other Home Patient oriented to: self, place, time and situation Is this baseline? Yes   Triage Complete: Triage complete  Chief Complaint Clogged Nephrectomy Tube / Port Needs Cleaning/ Kidney Pain   Triage Note Pt presents with c/o fever and nephrostomy tube clogged. Pt reports that the tube has been clogged since last night, pain starting last night.   Allergies No Known Allergies  Level of Care/Admitting Diagnosis ED Disposition    ED Disposition Condition Comment   Admit  Hospital Area: [facility] [[id number]]  Level of Care: Telemetry [5]  Admit to tele based on following criteria: Other see comments  Comments: Monitor hemodynamics  Diagnosis: Pyelonephritis [034742]  Admitting Physician: Shela Leff [[id number]]  Attending Physician: Shela Leff [[id number]]  PT Class (Do Not Modify): Observation [104]  PT Acc Code (Do Not Modify): Observation [10022]       B Medical/Surgery History Past Medical History:  Diagnosis Date  . 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 Chronic kidney disease (CKD), stage II (mild)   . Congenital birth defect   . Coronary artery disease    a. s/p CABG in 2008; b.  s/p cath in 2011 for med rx.;  c.  NSTEMI (02/2011):  LHC (02/2011):  LAD occluded, distal LAD filled via left to left collaterals, distal CFX 40-50%, proximal RCA occluded (right to right collaterals), distal RCA occluded, S-RCA occluded proximally, S-OM1/OM2 occluded (new from 2011 - culprit), L-LAD ok, ant apical HK, EF 45-50% - med Rx.  [name] Dyslipidemia   . History of renal stone   . Hx of cardiovascular stress test    Nuclear (06/2009):  EF 71%, small area of infarct/ischemia in inferior wall-unchanged from prior  .  Hx of echocardiogram    Echo (05/2013):  EF 55-60%, inf HK, mild AI, normal RVSF, RVSP 28 mmHg  . Hypertension   . Pancreatitis   . Recurrent UTI    Past Surgical History:  Procedure Laterality Date  . CARDIAC CATHETERIZATION  07/28/2009   EF 60%; Grafts patent except for SVG to the AM and PD. Native RCA is occluded.  [name] CARDIAC CATHETERIZATION  03/20/2006   EF 60-65%  . CARDIOVASCULAR STRESS TEST  06/12/2009   EF 71%, SMALL AREA OF INFARCT/ISCHEMIA IN INFERIOR WALL; FELT TO NOT BE CHANGED.  [name] CORONARY ARTERY BYPASS GRAFT  03/2006   LIMA GRAFT TO LAD, SAPHENOUS VEIN GRAFT TO THE ACUTE MARGINAL BRANCH THE RIGHT CORONARY, SAPHENOUS VEIN GRAFT TO THE PDA, SEQUENTIAL VEIN GRAFT TO THE FIRST AND SECOND OBTUSE MARGINAL VESSELS  . IR IMAGING GUIDED PORT INSERTION  09/19/2017  . KIDNEY SURGERY    . LEFT HEART CATHETERIZATION WITH CORONARY ANGIOGRAM N/A 02/26/2011   Procedure: LEFT HEART CATHETERIZATION WITH CORONARY ANGIOGRAM;  Surgeon: Thayer Headings, MD;  Location: [facility] CATH LAB;  Service: Cardiovascular;  Laterality: N/A;     A IV Location/Drains/Wounds Patient Lines/Drains/Airways Status   Active Line/Drains/Airways    Name:   Placement date:   Placement time:   Site:   Days:   Implanted Port 09/19/17 Right Chest   09/19/17    1501    Chest   183   Peripheral IV 03/21/18 Right Forearm   03/21/18    1523  Forearm   less than 1          Intake/Output Last 24 hours  Intake/Output Summary (Last 24 hours) at 03/21/2018 2121 Last data filed at 03/21/2018 1730 Gross per 24 hour  Intake 1600 ml  Output -  Net 1600 ml    Labs/Imaging Results for orders placed or performed during the hospital encounter of 03/21/18 (from the past 48 hour(s))  Comprehensive metabolic panel     Status: Abnormal   Collection Time: 03/21/18  2:47 PM  Result Value Ref Range   Sodium 139 135 - 145 mmol/L   Potassium 4.2 3.5 - 5.1 mmol/L   Chloride 103 98 - 111 mmol/L   CO2 26 22 - 32 mmol/L   Glucose, Bld 168  (H) 70 - 99 mg/dL   BUN 28 (H) 6 - 20 mg/dL   Creatinine, Ser 1.56 (H) 0.61 - 1.24 mg/dL   Calcium 9.5 8.9 - 10.3 mg/dL   Total Protein 7.8 6.5 - 8.1 g/dL   Albumin 3.4 (L) 3.5 - 5.0 g/dL   AST 16 15 - 41 U/L   ALT 10 0 - 44 U/L   Alkaline Phosphatase 56 38 - 126 U/L   Total Bilirubin 0.3 0.3 - 1.2 mg/dL   GFR calc non Af Amer 48 (L) >60 mL/min   GFR calc Af Amer 56 (L) >60 mL/min   Anion gap 10 5 - 15    Comment: Performed at [facility], [place] [address] [place], [place] [address]  Lactic acid, plasma     Status: None   Collection Time: 03/21/18  2:47 PM  Result Value Ref Range   Lactic Acid, Venous 1.9 0.5 - 1.9 mmol/L    Comment: Performed at [facility], [place] [address], [place], [place] [address]  CBC with Differential     Status: Abnormal   Collection Time: 03/21/18  2:47 PM  Result Value Ref Range   WBC 6.6 4.0 - 10.5 K/uL   RBC 2.61 (L) 4.22 - 5.81 MIL/uL   Hemoglobin 7.5 (L) 13.0 - 17.0 g/dL   HCT 25.6 (L) 39.0 - 52.0 %   MCV 98.1 80.0 - 100.0 fL   MCH 28.7 26.0 - 34.0 pg   MCHC 29.3 (L) 30.0 - 36.0 g/dL   RDW 19.6 (H) 11.5 - 15.5 %   Platelets 219 150 - 400 K/uL   nRBC 0.0 0.0 - 0.2 %   Neutrophils Relative % 85 %   Neutro Abs 5.7 1.7 - 7.7 K/uL   Lymphocytes Relative 7 %   Lymphs Abs 0.4 (L) 0.7 - 4.0 K/uL   Monocytes Relative 7 %   Monocytes Absolute 0.5 0.1 - 1.0 K/uL   Eosinophils Relative 1 %   Eosinophils Absolute 0.0 0.0 - 0.5 K/uL   Basophils Relative 0 %   Basophils Absolute 0.0 0.0 - 0.1 K/uL   Immature Granulocytes 0 %   Abs Immature Granulocytes 0.02 0.00 - 0.07 K/uL    Comment: Performed at [facility], [place] [address], [place], [place] [address]  Protime-INR     Status: None   Collection Time: 03/21/18  2:47 PM  Result Value Ref Range   Prothrombin Time 14.5 11.4 - 15.2 seconds   INR 1.1 0.8 - 1.2    Comment: (NOTE) INR goal varies based on device and disease states. Performed at  [facility], [place] [address], [place],  [address]   Urinalysis, Routine w reflex  microscopic     Status: Abnormal   Collection Time: 03/21/18  4:01 PM  Result Value Ref Range   Color, Urine YELLOW YELLOW   APPearance HAZY (A) CLEAR   Specific Gravity, Urine 1.010 1.005 - 1.030   pH 9.0 (H) 5.0 - 8.0   Glucose, UA NEGATIVE NEGATIVE mg/dL   Hgb urine dipstick SMALL (A) NEGATIVE   Bilirubin Urine NEGATIVE NEGATIVE   Ketones, ur NEGATIVE NEGATIVE mg/dL   Protein, ur 30 (A) NEGATIVE mg/dL   Nitrite NEGATIVE NEGATIVE   Leukocytes,Ua LARGE (A) NEGATIVE   RBC / HPF 6-10 0 - 5 RBC/hpf   WBC, UA >50 (H) 0 - 5 WBC/hpf   Bacteria, UA RARE (A) NONE SEEN   WBC Clumps PRESENT    Mucus PRESENT     Comment: Performed at [facility], [place] [address], [place], [place] [address]   Ct Renal Stone Study  Result Date: 03/21/2018 CLINICAL DATA:  History of bladder exstrophy. Pelvic pain and fever. Reported obstruction of right nephrostomy catheter EXAM: CT ABDOMEN AND PELVIS WITHOUT CONTRAST TECHNIQUE: Multidetector CT imaging of the abdomen and pelvis was performed following the standard protocol without oral or IV contrast. COMPARISON:  November 15, 2015 FINDINGS: Lower chest: There is bibasilar atelectasis. There is no lung base edema or consolidation. Hepatobiliary: No focal liver lesions are appreciable on this noncontrast enhanced study. Gallbladder wall is not appreciably thickened. There is no biliary duct dilatation. Pancreas: No pancreatic mass or inflammatory focus. Spleen: No focal splenic lesions.  Small splenules noted medially. Adrenals/Urinary Tract: Adrenals bilaterally appear normal. There are bilateral nephrostomy catheters. There is severe dilatation of each collecting system, somewhat more severe on the right than on the left. A small amount of air is noted within an upper pole calyx on the left, likely secondary to the nephrostomy placement. There  is a calculus in the lower pole of the left kidney measuring 7 x 5 mm. There is a 1 mm calculus in the lower pole left kidney. There is a cyst arising from the upper pole of the right kidney medially measuring 2.7 x 1.8 cm. There is no appreciable perinephric fluid. There is persistent marked dilatation of each ureter, a stable finding. The ureters insert ectopically along the urinary bladder on each side, likely secondary to the history of bladder exstrophy. No ureteral mass or calculus is demonstrable. There is diffuse opacification in the urinary bladder, likely hemorrhage. 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 There is diffuse soft tissue thickening in this area. Question inflammatory change versus neoplasm involving the urinary bladder. Stomach/Bowel: There is moderate stool in the colon. There is no appreciable bowel wall or mesenteric thickening. No evident bowel obstruction. There is no free air or portal venous air. Vascular/Lymphatic: There is no abdominal aortic aneurysm. There are foci of arterial vascular calcification in the aorta and proximal right common iliac artery. There is a prominent left inguinal lymph node measuring 1.4 x 1.4 cm. No other adenopathy is appreciable. Reproductive: Prostate and seminal vesicles appear grossly normal on this noncontrast enhanced study. Other: No appendiceal inflammation evident. No frank abscess or ascites appreciable in the abdomen or pelvis. Musculoskeletal: Marked widening of the pubic symphysis is noted consistent with history of bladder exstrophy. No blastic or lytic bone lesions are identified. There is disc narrowing with arthropathy at L5-S1. No intramuscular lesions are evident. IMPRESSION: 1. 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 Diffuse opacity in urinary bladder is noted as well as areas of air in  the bladder, likely due to the fistula. Suspect hemorrhage within the bladder, although there may be neoplasm superimposed. Question abdominal wall thickening due to inflammation versus neoplasm. Both entities may be present. Clinical assessment of this area advised. A well-defined abscess is not seen in this area, although there does appear to be some loculated fluid in the rectus muscle immediately adjacent to the fistula. Note that inflammation extends inferiorly to the level of the base of the penis. Neoplastic involvement in this area cannot be excluded on this noncontrast enhanced study. 2. Nephrostomies catheters bilaterally. There is severe hydronephrosis despite presence of the nephrostomy catheters. There is marked ureterectasis bilaterally with ectopic insertions of each ureter into the bladder consistent with history of exstrophy. No ureteral calculi or ureteral masses evident. There are nonobstructing calculi in the left kidney. There is no perinephric fluid on either side. 3. No evident bowel obstruction. No appreciable abscess in the abdomen or pelvis. Appendix region appears normal. 4. Prominent left inguinal lymph node. This lymph node enlargement could have either inflammatory or neoplastic etiology. 5. Marked widening of the pubic symphysis, a finding felt to be indicative of the known bladder exstrophy. 6.  Aortic atherosclerosis. Electronically Signed   By: Lowella Grip III M.D.   On: 03/21/2018 17:07    Pending Labs Unresulted Labs (From admission, onward)    Start     Ordered   03/22/18 0500  Hemoglobin A1c  Tomorrow morning,   R    Comments:  To assess prior glycemic control    03/21/18 2107   03/21/18 2104  HIV antibody (Routine Testing)  Once,   R     03/21/18 2107   03/21/18 1528  Urine culture  ONCE - STAT,   STAT     03/21/18 1529   03/21/18 1447  Culture, blood (Routine x 2)  BLOOD CULTURE X 2,   STAT     03/21/18 1446          Vitals/Pain Today's Vitals    03/21/18 1930 03/21/18 1945 03/21/18 2036 03/21/18 2115  BP: 108/74 108/74 116/80 106/75  Pulse: (!) 111 69 (!) 104 (!) 103  Resp: 20 11 (!) 21 11  Temp:   (!) 102.9 F (39.4 C)   TempSrc:   Oral   SpO2: 100% 95% 98% 97%  PainSc:        Isolation Precautions No active isolations  Medications Medications  sodium chloride flush (NS) 0.9 % injection 3 mL (3 mLs Intravenous Not Given 03/21/18 1731)  allopurinol (ZYLOPRIM) tablet 100 mg (has no administration in time range)  fenofibrate tablet 160 mg (has no administration in time range)  rosuvastatin (CRESTOR) tablet 40 mg (has no administration in time range)  DULoxetine (CYMBALTA) DR capsule 20 mg (has no administration in time range)  levothyroxine (SYNTHROID, LEVOTHROID) tablet 25 mcg (has no administration in time range)  baclofen (LIORESAL) tablet 20 mg (has no administration in time range)  carbamazepine (TEGRETOL) tablet 200 mg (has no administration in time range)  rOPINIRole (REQUIP) tablet 0.25 mg (has no administration in time range)  acetaminophen (TYLENOL) tablet 650 mg (has no administration in time range)    Or  acetaminophen (TYLENOL) suppository 650 mg (has no administration in time range)  morphine 2 MG/ML injection 1 mg (has no administration in time range)  insulin aspart (novoLOG) injection  0-9 Units (has no administration in time range)  acetaminophen (TYLENOL) tablet 650 mg (650 mg Oral Given 03/21/18 1535)  ceFEPIme (MAXIPIME) 2 g in sodium chloride 0.9 % 100 mL IVPB (0 g Intravenous Stopped 03/21/18 1729)  sodium chloride 0.9 % bolus 500 mL (0 mLs Intravenous Stopped 03/21/18 1600)  sodium chloride 0.9 % bolus 1,000 mL (0 mLs Intravenous Stopped 03/21/18 1730)  acetaminophen (TYLENOL) tablet 650 mg (650 mg Oral Given 03/21/18 2053)    Mobility walks Low fall risk   Focused Assessments Renal Assessment Handoff:     Restricted appendage: N/A    R Recommendations: See Admitting Provider Note  Report  given to:   Additional Notes:

## 2018-03-21 NOTE — Progress Notes (Signed)
Patients 68mcg Fentanyl patch removed from chest and wasted in sharps container with Elvis Coil, RN.

## 2018-03-21 NOTE — H&P (Signed)
History and Physical    Cruzito Standre MIW:[id number] DOB: Apr 16, 1960 DOA: 03/21/2018  PCP: [name], NP Patient coming from: Home  Chief Complaint: Clogged nephrostomy tube, fever  HPI: Andrew Joyce is a 58 y.o. male with medical history significant of CAD status post CABG, nephrolithiasis, high-grade urothelial renal carcinoma, bilateral nephrostomy tubes, recurrent nephrostomy tube malfunction, urinary bladder-abdominal wall fistula, chronic pain presenting to the hospital for evaluation of clogged nephrostomy tube and fever.  Patient states for the past 1 day his left nephrostomy tube has been blocked and the right tube has been partially blocked.  He has noticed a lot of urine leaking from his chronic bladder-abdominal wall fistula.  Reports having bilateral flank pain, fevers, and chills.  States his nephrostomy tubes were initially placed in July 2019 and have been replaced multiple times since then.  He is followed by urology at [facility].  His left nephrostomy tube was most recently replaced in February 2020.  Review of Systems: As per HPI otherwise 10 point review of systems negative.  Past Medical History:  Diagnosis Date   Bladder exstrophy     Congenital- had multiple surgeries as a child   Chronic kidney disease (CKD), stage II (mild)    Congenital birth defect    Coronary artery disease    a. s/p CABG in 2008; b.  s/p cath in 2011 for med rx.;  c.  NSTEMI (02/2011):  LHC (02/2011):  LAD occluded, distal LAD filled via left to left collaterals, distal CFX 40-50%, proximal RCA occluded (right to right collaterals), distal RCA occluded, S-RCA occluded proximally, S-OM1/OM2 occluded (new from 2011 - culprit), L-LAD ok, ant apical HK, EF 45-50% - med Rx.   Dyslipidemia    History of renal stone    Hx of cardiovascular stress test    Nuclear (06/2009):  EF 71%, small area of infarct/ischemia in inferior wall-unchanged from prior   Hx of echocardiogram    Echo (05/2013):  EF 55-60%, inf HK, mild AI, normal RVSF, RVSP 28 mmHg   Hypertension    Pancreatitis    Recurrent UTI     Past Surgical History:  Procedure Laterality Date   CARDIAC CATHETERIZATION  07/28/2009   EF 60%; Grafts patent except for SVG to the AM and PD. Native RCA is occluded.   CARDIAC CATHETERIZATION  03/20/2006   EF 60-65%   CARDIOVASCULAR STRESS TEST  06/12/2009   EF 71%, SMALL AREA OF INFARCT/ISCHEMIA IN INFERIOR WALL; FELT TO NOT BE CHANGED.   CORONARY ARTERY BYPASS GRAFT  03/2006   LIMA GRAFT TO LAD, SAPHENOUS VEIN GRAFT TO THE ACUTE MARGINAL BRANCH THE RIGHT CORONARY, SAPHENOUS VEIN GRAFT TO THE PDA, SEQUENTIAL VEIN GRAFT TO THE FIRST AND SECOND OBTUSE MARGINAL VESSELS   IR IMAGING GUIDED PORT INSERTION  09/19/2017   KIDNEY SURGERY     LEFT HEART CATHETERIZATION WITH CORONARY ANGIOGRAM N/A 02/26/2011   Procedure: LEFT HEART CATHETERIZATION WITH CORONARY ANGIOGRAM;  Surgeon: Thayer Headings, MD;  Location: [facility] CATH LAB;  Service: Cardiovascular;  Laterality: N/A;     reports that he has never smoked. He has never used smokeless tobacco. He reports that he does not drink alcohol or use drugs.  No Known Allergies  Family History  Problem Relation Age of Onset   Hyperlipidemia Mother        Living, 15   Hypertension Mother    Hyperlipidemia Father        Living 32   Hypertension Father  Healthy Sister    Healthy Brother     Prior to Admission medications   Medication Sig Start Date End Date Taking? Authorizing Provider  allopurinol (ZYLOPRIM) 100 MG tablet Take 1 tablet (100 mg total) by mouth daily. 08/08/16  Yes Lyndal Pulley, DO  amLODipine (NORVASC) 5 MG tablet Take 1 tablet (5 mg total) by mouth daily. 09/17/13  Yes Martinique, Peter M, MD  amoxicillin (AMOXIL) 250 MG capsule Take 250 mg by mouth daily. 08/13/17  Yes [provider]  baclofen (LIORESAL) 10 MG tablet TAKE 2 TABLETS BY MOUTH THREE TIMES DAILY Patient taking differently:  Take 20 mg by mouth 3 (three) times daily.  02/03/18  Yes Patel, Donika K, DO  carbamazepine (TEGRETOL) 200 MG tablet TAKE 1 TABLET BY MOUTH TWICE DAILY Patient taking differently: Take 200 mg by mouth 2 (two) times daily.  08/18/17  Yes Patel, Donika K, DO  DULoxetine (CYMBALTA) 20 MG capsule Take 20 mg by mouth at bedtime. For Sleep Aide 09/17/17  Yes [provider]  fenofibrate 160 MG tablet TAKE 1 TABLET BY MOUTH ONCE DAILY Patient taking differently: Take 160 mg by mouth daily.  10/14/17  Yes Martinique, Peter M, MD  fentaNYL (DURAGESIC - DOSED MCG/HR) 12 MCG/HR Place onto the skin. 12/08/17  Yes [provider]  gabapentin (NEURONTIN) 300 MG capsule Take 300 mg by mouth at bedtime as needed for pain. 03/11/18  Yes [provider]  glucose blood (ACCU-CHEK GUIDE) test strip Use to check blood sugars 2 times daily. Dx Code: E11.9 08/11/17  Yes [name], NP  isosorbide mononitrate (IMDUR) 60 MG 24 hr tablet TAKE 1 & 1/2 (ONE & ONE-HALF) TABLETS BY MOUTH ONCE DAILY Patient taking differently: Take 60-90 mg by mouth daily.  06/11/17  Yes Martinique, Peter M, MD  JANUVIA 50 MG tablet TAKE 1 TABLET BY MOUTH ONCE DAILY 01/13/18  Yes [name], NP  levothyroxine (SYNTHROID, LEVOTHROID) 25 MCG tablet Take 1 tablet (25 mcg total) by mouth daily. 08/08/16  Yes Lyndal Pulley, DO  lidocaine-prilocaine (EMLA) cream Apply 1 application topically as needed. 09/04/17  Yes Wyatt Portela, MD  losartan (COZAAR) 100 MG tablet TAKE 1 TABLET BY MOUTH ONCE DAILY 12/10/17  Yes Martinique, Peter M, MD  meloxicam (MOBIC) 15 MG tablet Take 1 tablet (15 mg total) by mouth daily. 10/29/16  Yes Gardiner Barefoot, DPM  metoprolol tartrate (LOPRESSOR) 50 MG tablet Take 1 tablet (50 mg total) by mouth 2 (two) times daily. 09/09/17  Yes Martinique, Peter M, MD  nitroGLYCERIN (NITROSTAT) 0.4 MG SL tablet Place 1 tablet (0.4 mg total) under the tongue every 5 (five) minutes as needed. For chest pain 06/20/16  Yes  Martinique, Peter M, MD  Oxycodone HCl 10 MG TABS Take 10 mg by mouth daily.  12/08/17  Yes [provider]  rOPINIRole (REQUIP) 0.25 MG tablet TAKE 1 TABLET BY MOUTH 3 HOURS BEFORE BEDTIME Patient taking differently: Take 0.25 mg by mouth at bedtime. TAKE 1 TABLET BY MOUTH 3 HOURS BEFORE BEDTIME 11/03/17  Yes Patel, Donika K, DO  rosuvastatin (CRESTOR) 40 MG tablet Take 1 tablet (40 mg total) by mouth daily. 03/02/18 05/31/18 Yes Martinique, Peter M, MD  sodium chloride flush 0.9 % SOLN injection Flush 10 mL of saline into each nephrostomy tube daily. 11/28/17  Yes [provider]  methenamine (MANDELAMINE) 1 g tablet Take 1,000 mg by mouth 3 (three) times daily.  12/21/15   [provider]  prochlorperazine (  COMPAZINE) 10 MG tablet TAKE 1 TABLET BY MOUTH EVERY 6 HOURS AS NEEDED FOR NAUSEA FOR VOMITING Patient not taking: Reported on 03/21/2018 02/05/18   Bruning, Ashlyn, PA-C  prochlorperazine (COMPAZINE) 10 MG tablet TAKE 1 TABLET BY MOUTH EVERY 6 HOURS AS NEEDED FOR NAUSEA OR FOR VOMITING Patient not taking: Reported on 03/21/2018 02/05/18   Bruning, Ashlyn, PA-C  traZODone (DESYREL) 50 MG tablet TAKE 1/2 TO 1 (ONE-HALF TO ONE) TABLET BY MOUTH AT BEDTIME AS NEEDED FOR SLEEP Patient not taking: Reported on 02/25/2018 12/17/17   [name], NP    Physical Exam: Vitals:   03/21/18 2130 03/21/18 2132 03/21/18 2155 03/21/18 2158  BP: 105/64  128/67   Pulse: 100  96   Resp: (!) 26  20   Temp:  (!) 101.9 F (38.8 C) (!) 100.6 F (38.1 C)   TempSrc:  Oral    SpO2: 99%  98%   Weight:    62.8 kg  Height:    5\' 10"  (1.778 m)    Physical Exam  Constitutional: He is oriented to person, place, and time. No distress.  HENT:  Head: Normocephalic.  Mouth/Throat: Oropharynx is clear and moist.  Eyes: Right eye exhibits no discharge. Left eye exhibits no discharge.  Cardiovascular: Regular rhythm and intact distal pulses.  Tachycardic  Pulmonary/Chest: Effort normal and  breath sounds normal. No respiratory distress. He has no wheezes. He has no rales.  Abdominal: Soft. Bowel sounds are normal. He exhibits no distension. There is no abdominal tenderness. There is no guarding.  Musculoskeletal:        General: No edema.  Neurological: He is alert and oriented to person, place, and time.  Skin: Skin is warm and dry. He is not diaphoretic.  Sites of nephrostomy tube insertion appears clean, dry, and no purulent drainage. Suprapubic region: Skin with mild erythema, breakdown.  Per patient, this appearance is chronic.        Labs on Admission: I have personally reviewed following labs and imaging studies  CBC: Recent Labs  Lab 03/21/18 1447  WBC 6.6  NEUTROABS 5.7  HGB 7.5*  HCT 25.6*  MCV 98.1  PLT 322   Basic Metabolic Panel: Recent Labs  Lab 03/21/18 1447  NA 139  K 4.2  CL 103  CO2 26  GLUCOSE 168*  BUN 28*  CREATININE 1.56*  CALCIUM 9.5   GFR: Estimated Creatinine Clearance: 45.3 mL/min (A) (by C-G formula based on SCr of 1.56 mg/dL (H)). Liver Function Tests: Recent Labs  Lab 03/21/18 1447  AST 16  ALT 10  ALKPHOS 56  BILITOT 0.3  PROT 7.8  ALBUMIN 3.4*   No results for input(s): LIPASE, AMYLASE in the last 168 hours. No results for input(s): AMMONIA in the last 168 hours. Coagulation Profile: Recent Labs  Lab 03/21/18 1447  INR 1.1   Cardiac Enzymes: No results for input(s): CKTOTAL, CKMB, CKMBINDEX, TROPONINI in the last 168 hours. BNP (last 3 results) No results for input(s): PROBNP in the last 8760 hours. HbA1C: No results for input(s): HGBA1C in the last 72 hours. CBG: No results for input(s): GLUCAP in the last 168 hours. Lipid Profile: No results for input(s): CHOL, HDL, LDLCALC, TRIG, CHOLHDL, LDLDIRECT in the last 72 hours. Thyroid Function Tests: No results for input(s): TSH, T4TOTAL, FREET4, T3FREE, THYROIDAB in the last 72 hours. Anemia Panel: No results for input(s): VITAMINB12, FOLATE, FERRITIN,  TIBC, IRON, RETICCTPCT in the last 72 hours. Urine analysis:    Component Value  Date/Time   COLORURINE YELLOW 03/21/2018 1601   APPEARANCEUR HAZY (A) 03/21/2018 1601   LABSPEC 1.010 03/21/2018 1601   PHURINE 9.0 (H) 03/21/2018 1601   GLUCOSEU NEGATIVE 03/21/2018 1601   HGBUR SMALL (A) 03/21/2018 1601   BILIRUBINUR NEGATIVE 03/21/2018 1601   KETONESUR NEGATIVE 03/21/2018 1601   PROTEINUR 30 (A) 03/21/2018 1601   UROBILINOGEN 0.2 02/26/2011 0439   NITRITE NEGATIVE 03/21/2018 1601   LEUKOCYTESUR LARGE (A) 03/21/2018 1601    Radiological Exams on Admission: Ct Renal Stone Study  Result Date: 03/21/2018 CLINICAL DATA:  History of bladder exstrophy. Pelvic pain and fever. Reported obstruction of right nephrostomy catheter EXAM: CT ABDOMEN AND PELVIS WITHOUT CONTRAST TECHNIQUE: Multidetector CT imaging of the abdomen and pelvis was performed following the standard protocol without oral or IV contrast. COMPARISON:  November 15, 2015 FINDINGS: Lower chest: There is bibasilar atelectasis. There is no lung base edema or consolidation. Hepatobiliary: No focal liver lesions are appreciable on this noncontrast enhanced study. Gallbladder wall is not appreciably thickened. There is no biliary duct dilatation. Pancreas: No pancreatic mass or inflammatory focus. Spleen: No focal splenic lesions.  Small splenules noted medially. Adrenals/Urinary Tract: Adrenals bilaterally appear normal. There are bilateral nephrostomy catheters. There is severe dilatation of each collecting system, somewhat more severe on the right than on the left. A small amount of air is noted within an upper pole calyx on the left, likely secondary to the nephrostomy placement. There is a calculus in the lower pole of the left kidney measuring 7 x 5 mm. There is a 1 mm calculus in the lower pole left kidney. There is a cyst arising from the upper pole of the right kidney medially measuring 2.7 x 1.8 cm. There is no appreciable perinephric  fluid. There is persistent marked dilatation of each ureter, a stable finding. The ureters insert ectopically along the urinary bladder on each side, likely secondary to the history of bladder exstrophy. No ureteral mass or calculus is demonstrable. There is diffuse opacification in the urinary bladder, likely hemorrhage. 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 There is diffuse soft tissue thickening in this area. Question inflammatory change versus neoplasm involving the urinary bladder. Stomach/Bowel: There is moderate stool in the colon. There is no appreciable bowel wall or mesenteric thickening. No evident bowel obstruction. There is no free air or portal venous air. Vascular/Lymphatic: There is no abdominal aortic aneurysm. There are foci of arterial vascular calcification in the aorta and proximal right common iliac artery. There is a prominent left inguinal lymph node measuring 1.4 x 1.4 cm. No other adenopathy is appreciable. Reproductive: Prostate and seminal vesicles appear grossly normal on this noncontrast enhanced study. Other: No appendiceal inflammation evident. No frank abscess or ascites appreciable in the abdomen or pelvis. Musculoskeletal: Marked widening of the pubic symphysis is noted consistent with history of bladder exstrophy. No blastic or lytic bone lesions are identified. There is disc narrowing with arthropathy at L5-S1. No intramuscular lesions are evident. IMPRESSION: 1. 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 Diffuse opacity in urinary bladder is noted as well as areas of air in the bladder, likely due to the fistula. Suspect hemorrhage within the bladder, although there may be neoplasm superimposed. Question abdominal wall thickening due to inflammation versus neoplasm. Both entities may be present. Clinical assessment of this area  advised. A well-defined abscess is not  seen in this area, although there does appear to be some loculated fluid in the rectus muscle immediately adjacent to the fistula. Note that inflammation extends inferiorly to the level of the base of the penis. Neoplastic involvement in this area cannot be excluded on this noncontrast enhanced study. 2. Nephrostomies catheters bilaterally. There is severe hydronephrosis despite presence of the nephrostomy catheters. There is marked ureterectasis bilaterally with ectopic insertions of each ureter into the bladder consistent with history of exstrophy. No ureteral calculi or ureteral masses evident. There are nonobstructing calculi in the left kidney. There is no perinephric fluid on either side. 3. No evident bowel obstruction. No appreciable abscess in the abdomen or pelvis. Appendix region appears normal. 4. Prominent left inguinal lymph node. This lymph node enlargement could have either inflammatory or neoplastic etiology. 5. Marked widening of the pubic symphysis, a finding felt to be indicative of the known bladder exstrophy. 6.  Aortic atherosclerosis. Electronically Signed   By: Lowella Grip III M.D.   On: 03/21/2018 17:07    EKG: Independently reviewed.  Sinus tachycardia, IVCD, atypical LBBB.  Assessment/Plan Principal Problem:   Pyelonephritis Active Problems:   CKD (chronic kidney disease) stage 3, GFR 30-59 ml/min (HCC)   Hydronephrosis   Sepsis (HCC)   Anemia   Type 2 diabetes mellitus (HCC)   Sepsis secondary to pyelonephritis, severe bilateral hydronephrosis secondary to nephrostomy tube malfunction -Patient with history of high-grade urothelial renal carcinoma, bilateral nephrostomy tubes, presenting with a 1 day history of fevers, chills, bilateral flank pain, and complaints of complete left nephrostomy tube blockage and partial right nephrostomy tube blockage. -Febrile, tachycardic, and tachypneic.  Not hypotensive.  No leukocytosis.   Lactic acid normal.  Renal function at baseline. -UA with negative nitrite, large amount of leukocytes, greater than 50 WBCs, 6-10 RBCs, and rare bacteria. -CT showing severe bilateral hydronephrosis and marked ureterectasis bilaterally.  No ureteral calculi or ureteral mass.  Nonobstructing calculi in the left kidney. CT also showing urinary bladder-abdominal wall fistula, possible hemorrhage within the bladder versus superimposed neoplasm.  Showing loculated fluid in the rectus muscle adjacent to the fistula and inflammation extending inferiorly to the level of the base of the penis, neoplastic involvement cannot be excluded.  Prominent left inguinal lymph node.   -ED provider spoke to [facility] urology who reviewed the renal CT and thought the patient's hydronephrosis had worsened since previous available CT possibly from tube obstruction versus infection. Recommendation was to consult patient's primary urology team at [facility].  -ED provider discussed the case with [facility] urology who recommended IR involvement here in [place]. Due to coronavirus [facility] is limiting transfers unless services are not available in referring hospital. -ED provider then spoke to IR. Nephrostomy tubes were flushed in the ED and deemed patent. -I also called and discussed the case with IR. Patient will be seen in the morning and nephrostomy exchange will be done. -Continue cefepime -Received 1.5 L fluid boluses.  Tachycardia has resolved.  Nephrostomy tubes now functioning, monitor urine output. -IV morphine 1 mg every 3 hours as needed for pain -Tylenol PRN -Blood culture x2 -Urine culture  Chronic normocytic anemia -Hemoglobin 7.5 at baseline. -Continue to monitor CBC  CKD 3 -BUN 28 and creatinine 1.5.  Renal function at baseline. -Continue to monitor BMP and urine output via nephrostomy tubes  Type 2 diabetes -Last A1c 7.7 in July 2019.  Repeat A1c.  Sliding scale insulin and CBG checks.  Gout -Continue  allopurinol  Hypothyroidism -Continue Synthroid  Hyperlipidemia -  Continue Crestor  DVT prophylaxis: SCDs Code Status: Full code Family Communication: Wife at bedside. Disposition Plan: Anticipate discharge after clinical improvement. Consults called: IR (Dr. Pascal Lux) Admission status: It is my clinical opinion that admission to INPATIENT is reasonable and necessary in this 58 y.o. male  presenting with symptoms of fevers, chills, bilateral flank pain, concerning for sepsis secondary to pyelonephritis and severe bilateral hydronephrosis secondary to malfunctioning nephrostomy tubes  in the context of PMH including: High-grade urothelial renal carcinoma and bilateral nephrostomy tubes  with pertinent positives on physical exam including: Fever, tachycardia  and pertinent positives on radiographic and laboratory data including: CT with evidence of severe bilateral hydronephrosis.  Workup and treatment include IV antibiotic, exchange of nephrostomy tubes.  Given the aforementioned, the predictability of an adverse outcome is felt to be significant. I expect that the patient will require at least 2 midnights in the hospital to treat this condition.    Shela Leff MD [facility] Pager [phone]  If 7PM-7AM, please contact night-coverage [email or web address] Password [facility]  03/22/2018, 12:04 AM

## 2018-03-21 NOTE — ED Notes (Signed)
Pt resting. Temp is back up so medication given per [facility]. No acute distress.

## 2018-03-21 NOTE — ED Triage Notes (Signed)
Pt presents with c/o fever and nephrostomy tube clogged. Pt reports that the tube has been clogged since last night, pain starting last night.

## 2018-03-22 ENCOUNTER — Encounter (HOSPITAL_COMMUNITY): Payer: Self-pay | Admitting: Interventional Radiology

## 2018-03-22 ENCOUNTER — Inpatient Hospital Stay (HOSPITAL_COMMUNITY): Payer: Medicare Other

## 2018-03-22 DIAGNOSIS — N183 Chronic kidney disease, stage 3 (moderate): Secondary | ICD-10-CM

## 2018-03-22 DIAGNOSIS — A419 Sepsis, unspecified organism: Secondary | ICD-10-CM

## 2018-03-22 DIAGNOSIS — E119 Type 2 diabetes mellitus without complications: Secondary | ICD-10-CM

## 2018-03-22 DIAGNOSIS — D649 Anemia, unspecified: Secondary | ICD-10-CM

## 2018-03-22 HISTORY — PX: IR NEPHROSTOMY EXCHANGE LEFT: IMG6069

## 2018-03-22 HISTORY — PX: IR NEPHROSTOMY EXCHANGE RIGHT: IMG6070

## 2018-03-22 LAB — CBC
HCT: 22.7 % — ABNORMAL LOW (ref 39.0–52.0)
HCT: 26.7 % — ABNORMAL LOW (ref 39.0–52.0)
Hemoglobin: 6.8 g/dL — CL (ref 13.0–17.0)
Hemoglobin: 8 g/dL — ABNORMAL LOW (ref 13.0–17.0)
MCH: 29.2 pg (ref 26.0–34.0)
MCH: 28.4 pg (ref 26.0–34.0)
MCHC: 30 g/dL (ref 30.0–36.0)
MCHC: 30 g/dL (ref 30.0–36.0)
MCV: 97.4 fL (ref 80.0–100.0)
MCV: 94.7 fL (ref 80.0–100.0)
Platelets: 180 10*3/uL (ref 150–400)
Platelets: 212 10*3/uL (ref 150–400)
RBC: 2.33 MIL/uL — ABNORMAL LOW (ref 4.22–5.81)
RBC: 2.82 MIL/uL — ABNORMAL LOW (ref 4.22–5.81)
RDW: 19.4 % — ABNORMAL HIGH (ref 11.5–15.5)
RDW: 19.9 % — AB (ref 11.5–15.5)
WBC: 8.9 10*3/uL (ref 4.0–10.5)
WBC: 8.5 10*3/uL (ref 4.0–10.5)
nRBC: 0 % (ref 0.0–0.2)
nRBC: 0 % (ref 0.0–0.2)

## 2018-03-22 LAB — GLUCOSE, CAPILLARY
Glucose-Capillary: 205 mg/dL — ABNORMAL HIGH (ref 70–99)
Glucose-Capillary: 220 mg/dL — ABNORMAL HIGH (ref 70–99)
Glucose-Capillary: 142 mg/dL — ABNORMAL HIGH (ref 70–99)

## 2018-03-22 LAB — BASIC METABOLIC PANEL
Anion gap: 9 (ref 5–15)
BUN: 27 mg/dL — ABNORMAL HIGH (ref 6–20)
CO2: 23 mmol/L (ref 22–32)
CREATININE: 1.52 mg/dL — AB (ref 0.61–1.24)
Calcium: 8.8 mg/dL — ABNORMAL LOW (ref 8.9–10.3)
Chloride: 103 mmol/L (ref 98–111)
GFR calc Af Amer: 57 mL/min — ABNORMAL LOW (ref >60)
GFR calc non Af Amer: 49 mL/min — ABNORMAL LOW (ref >60)
Glucose, Bld: 232 mg/dL — ABNORMAL HIGH (ref 70–99)
Potassium: 3.8 mmol/L (ref 3.5–5.1)
Sodium: 135 mmol/L (ref 135–145)

## 2018-03-22 LAB — BLOOD CULTURE ID PANEL (REFLEXED)
Acinetobacter baumannii: NOT DETECTED
CANDIDA PARAPSILOSIS: NOT DETECTED
Candida albicans: NOT DETECTED
Candida glabrata: NOT DETECTED
Candida krusei: NOT DETECTED
Candida tropicalis: NOT DETECTED
Carbapenem resistance: NOT DETECTED
Enterobacter cloacae complex: NOT DETECTED
Enterobacteriaceae species: DETECTED — AB
Enterococcus species: NOT DETECTED
Escherichia coli: NOT DETECTED
Haemophilus influenzae: NOT DETECTED
Klebsiella oxytoca: NOT DETECTED
Klebsiella pneumoniae: DETECTED — AB
Listeria monocytogenes: NOT DETECTED
Neisseria meningitidis: NOT DETECTED
PROTEUS SPECIES: NOT DETECTED
Pseudomonas aeruginosa: NOT DETECTED
Serratia marcescens: NOT DETECTED
Staphylococcus aureus (BCID): NOT DETECTED
Staphylococcus species: NOT DETECTED
Streptococcus agalactiae: NOT DETECTED
Streptococcus pneumoniae: NOT DETECTED
Streptococcus pyogenes: NOT DETECTED
Streptococcus species: NOT DETECTED

## 2018-03-22 LAB — HEMOGLOBIN A1C
Hgb A1c MFr Bld: 5.8 % — ABNORMAL HIGH (ref 4.8–5.6)
Mean Plasma Glucose: 119.76 mg/dL

## 2018-03-22 LAB — PREPARE RBC (CROSSMATCH)

## 2018-03-22 LAB — ABO/RH: ABO/RH(D): O POS

## 2018-03-22 MED ORDER — SODIUM CHLORIDE 0.9 % IV SOLN
2.0000 g | Freq: Two times a day (BID) | INTRAVENOUS | Status: DC
  Administered 2018-03-22 – 2018-03-24 (×5): 2 g via INTRAVENOUS
  Filled 2018-03-22 (×5): qty 2

## 2018-03-22 MED ORDER — SODIUM CHLORIDE 0.9% FLUSH
5.0000 mL | Freq: Three times a day (TID) | INTRAVENOUS | Status: DC
  Administered 2018-03-22 – 2018-03-28 (×16): 5 mL

## 2018-03-22 MED ORDER — SODIUM CHLORIDE 0.9 % IV SOLN
INTRAVENOUS | Status: DC
  Administered 2018-03-22 – 2018-03-25 (×5): via INTRAVENOUS

## 2018-03-22 MED ORDER — LIDOCAINE HCL 1 % IJ SOLN
INTRAMUSCULAR | Status: AC
  Filled 2018-03-22: qty 20

## 2018-03-22 MED ORDER — SODIUM CHLORIDE 0.9% IV SOLUTION
Freq: Once | INTRAVENOUS | Status: AC
  Administered 2018-03-22: 11:00:00 via INTRAVENOUS

## 2018-03-22 MED ORDER — LIDOCAINE HCL (PF) 1 % IJ SOLN
INTRAMUSCULAR | Status: AC | PRN
  Administered 2018-03-22: 10 mL

## 2018-03-22 MED ORDER — IOHEXOL 300 MG/ML  SOLN
50.0000 mL | Freq: Once | INTRAMUSCULAR | Status: AC | PRN
  Administered 2018-03-22: 20 mL via INTRAVENOUS

## 2018-03-22 NOTE — Procedures (Signed)
Pre Procedure Dx: Hydronephrosis Post Procedure Dx: Same  Successful bilateral PCN exchange and upsizing to 10 Fr.   EBL: None   No immediate complications.   Ronny Bacon, MD Pager #: [phone]

## 2018-03-22 NOTE — Progress Notes (Signed)
PHARMACY - PHYSICIAN COMMUNICATION CRITICAL VALUE ALERT - BLOOD CULTURE IDENTIFICATION (BCID)  Andrew Joyce is an 58 y.o. male who presented to [facility] on 03/21/2018 with a chief complaint of Clogged nephrostomy tube, fever  Assessment:  Sepsis secondary to pyelonephritis, severe bilateral hydronephrosis secondary to nephrostomy tube malfunction   Name of physician (or Provider) Contacted: Dr. Waldron [facility]  Current antibiotics: Cefepime 2 gr IV q12h   Changes to prescribed antibiotics recommended:  Response not received from provider;  current antibiotics are likely to cover the isolated organism.  Consider de-escalation soon.  Results for orders placed or performed during the hospital encounter of 03/21/18  Blood Culture ID Panel (Reflexed) (Collected: 03/21/2018  2:47 PM)  Result Value Ref Range   Enterococcus species NOT DETECTED NOT DETECTED   Listeria monocytogenes NOT DETECTED NOT DETECTED   Staphylococcus species NOT DETECTED NOT DETECTED   Staphylococcus aureus (BCID) NOT DETECTED NOT DETECTED   Streptococcus species NOT DETECTED NOT DETECTED   Streptococcus agalactiae NOT DETECTED NOT DETECTED   Streptococcus pneumoniae NOT DETECTED NOT DETECTED   Streptococcus pyogenes NOT DETECTED NOT DETECTED   Acinetobacter baumannii NOT DETECTED NOT DETECTED   Enterobacteriaceae species DETECTED (A) NOT DETECTED   Enterobacter cloacae complex NOT DETECTED NOT DETECTED   Escherichia coli NOT DETECTED NOT DETECTED   Klebsiella oxytoca NOT DETECTED NOT DETECTED   Klebsiella pneumoniae DETECTED (A) NOT DETECTED   Proteus species NOT DETECTED NOT DETECTED   Serratia marcescens NOT DETECTED NOT DETECTED   Carbapenem resistance NOT DETECTED NOT DETECTED   Haemophilus influenzae NOT DETECTED NOT DETECTED   Neisseria meningitidis NOT DETECTED NOT DETECTED   Pseudomonas aeruginosa NOT DETECTED NOT DETECTED   Candida albicans NOT DETECTED NOT DETECTED   Candida glabrata NOT DETECTED  NOT DETECTED   Candida krusei NOT DETECTED NOT DETECTED   Candida parapsilosis NOT DETECTED NOT DETECTED   Candida tropicalis NOT DETECTED NOT DETECTED    Royetta Asal, PharmD, BCPS Pager [phone] 03/22/2018 12:42 PM

## 2018-03-22 NOTE — Progress Notes (Signed)
.  CRITICAL VALUE ALERT  Critical Value:  Hemoglobin 6.8  Date & Time Notied:  3/15 0645  Provider Notified: on-call NP  Orders Received/Actions taken: type and screen, prepare rbc, obtain consent to transfuse

## 2018-03-22 NOTE — Progress Notes (Signed)
Pt has chronic bladder abdominal wall fistula that continuously leaks urine. MD was made are. Pt will not allow nursing staff to clean around the area. Only allowing for staff to change pads once they have become soiled. Will continue to monitor pt.

## 2018-03-22 NOTE — Progress Notes (Signed)
PROGRESS NOTE                                                                                                                                                                                                             Patient Demographics:    Andrew Joyce, is a 58 y.o. male, DOB - 12/15/1960, EYC:[id number]  Admit date - 03/21/2018   Admitting Physician Shela Leff, MD  Outpatient Primary MD for the patient is [name], NP  LOS - 1   Chief Complaint  Patient presents with  . Fever  . Nephrostomy Tube clogged       Brief Narrative    Andrew Joyce is a 58 y.o. male with medical history significant of CAD status post CABG, nephrolithiasis, high-grade urothelial renal carcinoma, bilateral nephrostomy tubes, recurrent nephrostomy tube malfunction, urinary bladder-abdominal wall fistula, chronic pain presenting to the hospital for evaluation of clogged nephrostomy tube and fever.  Patient states for the past 1 day his left nephrostomy tube has been blocked and the right tube has been partially blocked.  He has noticed a lot of urine leaking from his chronic bladder-abdominal wall fistula.  Reports having bilateral flank pain, fevers, and chills.  States his nephrostomy tubes were initially placed in July 2019 and have been replaced multiple times since then.  He is followed by urology at [facility].  His left nephrostomy tube was most recently replaced in February 2020.   Subjective:    Andrew Joyce today reports he is feeling better, flank pain has improved,    Assessment  & Plan :    Principal Problem:   Pyelonephritis Active Problems:   CKD (chronic kidney disease) stage 3, GFR 30-59 ml/min (HCC)   Hydronephrosis   Sepsis ([place])   Anemia   Type 2 diabetes mellitus (HCC)   Sepsis secondary to bacteremia/pyelonephritis, from severe bilateral hydronephrosis secondary to nephrostomy tube malfunction -Sepsis present on  admission, as patient is febrile, 3.2, tachycardic 128, tachypneic at 27. -Patient with history of high-grade urothelial renal carcinoma, bilateral nephrostomy tubes, most of his urology care at [facility]. presenting with a 1 day history of fevers, chills, bilateral flank pain, and complaints of complete left nephrostomy tube blockage and partial right nephrostomy tube blockage. -WA, started empirically on cefepime, now blood cultures are positive as well, following sensitivity and susceptibility, for  now continue with IV cefepime, if remains afebrile, improved can narrow to IV Rocephin tomorrow. -With history of renal calculi, causing frequent obstruction of his nephrostomies per patient, tender to coronavirus emergency, [facility] not accepting any patients, IR consulted, they will exchange his bilateral nephrostomy tubes today. -IV morphine 1 mg every 3 hours as needed for pain -Tylenol PRN  Chronic normocytic anemia -Hemoglobin 6.8 this morning, transfuse 1 unit PRBC  CKD 3 -BUN 28 and creatinine 1.5.  Renal function at baseline. -Continue to monitor BMP and urine output via nephrostomy tubes  Type 2 diabetes -Last A1c 7.7 in July 2019.  Repeat A1c.  Sliding scale insulin and CBG checks.  Gout -Continue allopurinol  Hypothyroidism -Continue Synthroid  Hyperlipidemia -Continue Crestor    Code Status : Full  Family Communication  : None at bedside  Disposition Plan  : home when stable  Barriers For Discharge : Bacteremia, on IV antibiotics,  Consults  :  IR  Procedures  : none  DVT Prophylaxis  :  SCD  Lab Results  Component Value Date   PLT 180 03/22/2018    Antibiotics  :    Anti-infectives (From admission, onward)   Start     Dose/Rate Route Frequency Ordered Stop   03/22/18 0600  ceFEPIme (MAXIPIME) 2 g in sodium chloride 0.9 % 100 mL IVPB     2 g 200 mL/hr over 30 Minutes Intravenous Every 12 hours 03/22/18 0202     03/21/18 1545  ceFEPIme (MAXIPIME) 2 g in  sodium chloride 0.9 % 100 mL IVPB     2 g 200 mL/hr over 30 Minutes Intravenous  Once 03/21/18 1530 03/21/18 1729        Objective:   Vitals:   03/22/18 1013 03/22/18 1058 03/22/18 1256 03/22/18 1351  BP: (!) 100/53 (!) 102/52 114/66 126/67  Pulse: 75 76 76 77  Resp: 16 20 18 14   Temp: 98.3 F (36.8 C) 98.5 F (36.9 C) 99.1 F (37.3 C) 98.9 F (37.2 C)  TempSrc: Oral Oral Oral Oral  SpO2: 100% 100% 100% 100%  Weight:      Height:        Wt Readings from Last 3 Encounters:  03/21/18 62.8 kg  02/25/18 60.8 kg  12/17/17 66.6 kg     Intake/Output Summary (Last 24 hours) at 03/22/2018 1458 Last data filed at 03/22/2018 1345 Gross per 24 hour  Intake 2044.67 ml  Output 700 ml  Net 1344.67 ml     Physical Exam  Awake Alert, Oriented X 3, No new F.N deficits, Normal affect Symmetrical Chest wall movement, Good air movement bilaterally, CTAB RRR,No Gallops,Rubs or new Murmurs, No Parasternal Heave +ve B.Sounds, Abd Soft, with suprapubic mass involving the abdominal wall, urine discharge or surrounding erythema could be noted, has bilateral nephrostomy tubes. No Cyanosis, Clubbing or edema, No new Rash or bruise     Data Review:    CBC Recent Labs  Lab 03/21/18 1447 03/22/18 0527  WBC 6.6 8.9  HGB 7.5* 6.8*  HCT 25.6* 22.7*  PLT 219 180  MCV 98.1 97.4  MCH 28.7 29.2  MCHC 29.3* 30.0  RDW 19.6* 19.4*  LYMPHSABS 0.4*  --   MONOABS 0.5  --   EOSABS 0.0  --   BASOSABS 0.0  --     Chemistries  Recent Labs  Lab 03/21/18 1447 03/22/18 0527  NA 139 135  K 4.2 3.8  CL 103 103  CO2 26 23  GLUCOSE 168* 232*  BUN 28* 27*  CREATININE 1.56* 1.52*  CALCIUM 9.5 8.8*  AST 16  --   ALT 10  --   ALKPHOS 56  --   BILITOT 0.3  --    ------------------------------------------------------------------------------------------------------------------ No results for input(s): CHOL, HDL, LDLCALC, TRIG, CHOLHDL, LDLDIRECT in the last 72 hours.  Lab Results   Component Value Date   HGBA1C 5.8 (H) 03/22/2018   ------------------------------------------------------------------------------------------------------------------ No results for input(s): TSH, T4TOTAL, T3FREE, THYROIDAB in the last 72 hours.  Invalid input(s): FREET3 ------------------------------------------------------------------------------------------------------------------ No results for input(s): VITAMINB12, FOLATE, FERRITIN, TIBC, IRON, RETICCTPCT in the last 72 hours.  Coagulation profile Recent Labs  Lab 03/21/18 1447  INR 1.1    No results for input(s): DDIMER in the last 72 hours.  Cardiac Enzymes No results for input(s): CKMB, TROPONINI, MYOGLOBIN in the last 168 hours.  Invalid input(s): CK ------------------------------------------------------------------------------------------------------------------ No results found for: BNP  Inpatient Medications  Scheduled Meds: . allopurinol  100 mg Oral Daily  . baclofen  20 mg Oral TID  . carbamazepine  200 mg Oral BID  . DULoxetine  20 mg Oral QHS  . fenofibrate  160 mg Oral Daily  . insulin aspart  0-9 Units Subcutaneous TID WC  . levothyroxine  25 mcg Oral Q0600  . rOPINIRole  0.25 mg Oral QHS  . rosuvastatin  40 mg Oral Daily  . sodium chloride flush  3 mL Intravenous Once   Continuous Infusions: . ceFEPime (MAXIPIME) IV 2 g (03/22/18 0501)   PRN Meds:.acetaminophen **OR** acetaminophen, morphine injection  Micro Results Recent Results (from the past 240 hour(s))  Culture, blood (Routine x 2)     Status: None (Preliminary result)   Collection Time: 03/21/18  2:47 PM  Result Value Ref Range Status   Specimen Description   Final    RIGHT ANTECUBITAL Performed at [facility] [address], [place], [place] [address]    Special Requests   Final    BOTTLES DRAWN AEROBIC AND ANAEROBIC Blood Culture results may not be optimal due to an excessive volume of blood received in culture  bottles Performed at [facility] [address] [place], [place] [address]    Culture  Setup Time   Final    GRAM NEGATIVE RODS ANAEROBIC BOTTLE ONLY Organism ID to follow Performed at [facility], [place] [address], [place], [place] [address]    Culture GRAM NEGATIVE RODS  Final   Report Status PENDING  Incomplete  Blood Culture ID Panel (Reflexed)     Status: Abnormal   Collection Time: 03/21/18  2:47 PM  Result Value Ref Range Status   Enterococcus species NOT DETECTED NOT DETECTED Final   Listeria monocytogenes NOT DETECTED NOT DETECTED Final   Staphylococcus species NOT DETECTED NOT DETECTED Final   Staphylococcus aureus (BCID) NOT DETECTED NOT DETECTED Final   Streptococcus species NOT DETECTED NOT DETECTED Final   Streptococcus agalactiae NOT DETECTED NOT DETECTED Final   Streptococcus pneumoniae NOT DETECTED NOT DETECTED Final   Streptococcus pyogenes NOT DETECTED NOT DETECTED Final   Acinetobacter baumannii NOT DETECTED NOT DETECTED Final   Enterobacteriaceae species DETECTED (A) NOT DETECTED Final    Comment: Enterobacteriaceae represent a large family of gram-negative bacteria, not a single organism. CRITICAL RESULT CALLED TO, READ BACK BY AND VERIFIED WITH: M. BELL, PHARMD ([place]) AT 4580 ON 03/22/18 BY C. JESSUP, MLT.    Enterobacter cloacae complex NOT DETECTED NOT DETECTED Final   Escherichia coli NOT DETECTED NOT DETECTED Final  Klebsiella oxytoca NOT DETECTED NOT DETECTED Final   Klebsiella pneumoniae DETECTED (A) NOT DETECTED Final    Comment: CRITICAL RESULT CALLED TO, READ BACK BY AND VERIFIED WITH: M. BELL, PHARMD ([facility]) AT 2992 ON 03/22/18 BY C. JESSUP, MLT.    Proteus species NOT DETECTED NOT DETECTED Final   Serratia marcescens NOT DETECTED NOT DETECTED Final   Carbapenem resistance NOT DETECTED NOT DETECTED Final   Haemophilus influenzae NOT DETECTED NOT DETECTED Final   Neisseria meningitidis NOT DETECTED NOT DETECTED Final    Pseudomonas aeruginosa NOT DETECTED NOT DETECTED Final   Candida albicans NOT DETECTED NOT DETECTED Final   Candida glabrata NOT DETECTED NOT DETECTED Final   Candida krusei NOT DETECTED NOT DETECTED Final   Candida parapsilosis NOT DETECTED NOT DETECTED Final   Candida tropicalis NOT DETECTED NOT DETECTED Final    Comment: Performed at [facility], [place] [address], [place], [place] [address]  Culture, blood (Routine x 2)     Status: None (Preliminary result)   Collection Time: 03/21/18  2:52 PM  Result Value Ref Range Status   Specimen Description   Final    BLOOD RIGHT FOREARM Performed at [facility] [address] [place], [place] [address]    Special Requests   Final    BOTTLES DRAWN AEROBIC AND ANAEROBIC Blood Culture adequate volume Performed at [facility] [address], [place], [place] [address]    Culture  Setup Time   Final    GRAM NEGATIVE RODS IN BOTH AEROBIC AND ANAEROBIC BOTTLES    Culture   Final    NO GROWTH < 24 HOURS Performed at [facility], [place] [address], [place], [place] [address]    Report Status PENDING  Incomplete    Radiology Reports Ct Renal Stone Study  Result Date: 03/21/2018 CLINICAL DATA:  History of bladder exstrophy. Pelvic pain and fever. Reported obstruction of right nephrostomy catheter EXAM: CT ABDOMEN AND PELVIS WITHOUT CONTRAST TECHNIQUE: Multidetector CT imaging of the abdomen and pelvis was performed following the standard protocol without oral or IV contrast. COMPARISON:  November 15, 2015 FINDINGS: Lower chest: There is bibasilar atelectasis. There is no lung base edema or consolidation. Hepatobiliary: No focal liver lesions are appreciable on this noncontrast enhanced study. Gallbladder wall is not appreciably thickened. There is no biliary duct dilatation. Pancreas: No pancreatic mass or inflammatory focus. Spleen: No focal splenic lesions.  Small splenules noted medially.  Adrenals/Urinary Tract: Adrenals bilaterally appear normal. There are bilateral nephrostomy catheters. There is severe dilatation of each collecting system, somewhat more severe on the right than on the left. A small amount of air is noted within an upper pole calyx on the left, likely secondary to the nephrostomy placement. There is a calculus in the lower pole of the left kidney measuring 7 x 5 mm. There is a 1 mm calculus in the lower pole left kidney. There is a cyst arising from the upper pole of the right kidney medially measuring 2.7 x 1.8 cm. There is no appreciable perinephric fluid. There is persistent marked dilatation of each ureter, a stable finding. The ureters insert ectopically along the urinary bladder on each side, likely secondary to the history of bladder exstrophy. No ureteral mass or calculus is demonstrable. There is diffuse opacification in the urinary bladder, likely hemorrhage. 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 There is diffuse soft tissue thickening in this area. Question inflammatory change versus  neoplasm involving the urinary bladder. Stomach/Bowel: There is moderate stool in the colon. There is no appreciable bowel wall or mesenteric thickening. No evident bowel obstruction. There is no free air or portal venous air. Vascular/Lymphatic: There is no abdominal aortic aneurysm. There are foci of arterial vascular calcification in the aorta and proximal right common iliac artery. There is a prominent left inguinal lymph node measuring 1.4 x 1.4 cm. No other adenopathy is appreciable. Reproductive: Prostate and seminal vesicles appear grossly normal on this noncontrast enhanced study. Other: No appendiceal inflammation evident. No frank abscess or ascites appreciable in the abdomen or pelvis. Musculoskeletal: Marked widening of the pubic symphysis is noted consistent with history of bladder exstrophy. No blastic or lytic bone lesions are identified.  There is disc narrowing with arthropathy at L5-S1. No intramuscular lesions are evident. IMPRESSION: 1. 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 Diffuse opacity in urinary bladder is noted as well as areas of air in the bladder, likely due to the fistula. Suspect hemorrhage within the bladder, although there may be neoplasm superimposed. Question abdominal wall thickening due to inflammation versus neoplasm. Both entities may be present. Clinical assessment of this area advised. A well-defined abscess is not seen in this area, although there does appear to be some loculated fluid in the rectus muscle immediately adjacent to the fistula. Note that inflammation extends inferiorly to the level of the base of the penis. Neoplastic involvement in this area cannot be excluded on this noncontrast enhanced study. 2. Nephrostomies catheters bilaterally. There is severe hydronephrosis despite presence of the nephrostomy catheters. There is marked ureterectasis bilaterally with ectopic insertions of each ureter into the bladder consistent with history of exstrophy. No ureteral calculi or ureteral masses evident. There are nonobstructing calculi in the left kidney. There is no perinephric fluid on either side. 3. No evident bowel obstruction. No appreciable abscess in the abdomen or pelvis. Appendix region appears normal. 4. Prominent left inguinal lymph node. This lymph node enlargement could have either inflammatory or neoplastic etiology. 5. Marked widening of the pubic symphysis, a finding felt to be indicative of the known bladder exstrophy. 6.  Aortic atherosclerosis. Electronically Signed   By: Lowella Grip III M.D.   On: 03/21/2018 17:07     Phillips Climes M.D on 03/22/2018 at 2:58 PM  Between 7am to 7pm - Pager - [phone]  After 7pm go to [email or web address] - password [facility]  [facility] -  Office  [phone]

## 2018-03-22 NOTE — Progress Notes (Signed)
Pharmacy Antibiotic Note  Andrew Joyce is a 58 y.o. male admitted on 03/21/2018 with UTI.  Pharmacy has been consulted for Cefepime dosing.  Plan: Cefepime 2gm iv q12hr  Height: 5\' 10"  (177.8 cm) Weight: 138 lb 7.2 oz (62.8 kg) IBW/kg (Calculated) : 73  Temp (24hrs), Avg:102.2 F (39 C), Min:100.6 F (38.1 C), Max:103.2 F (39.6 C)  Recent Labs  Lab 03/21/18 1447  WBC 6.6  CREATININE 1.56*  LATICACIDVEN 1.9    Estimated Creatinine Clearance: 45.3 mL/min (A) (by C-G formula based on SCr of 1.56 mg/dL (H)).    No Known Allergies  Antimicrobials this admission: Cefepime 03/21/2018 >>   Dose adjustments this admission: -  Microbiology results: -  Thank you for allowing pharmacy to be a part of this patient's care.  Nani Skillern Crowford 03/22/2018 2:03 AM

## 2018-03-23 DIAGNOSIS — Z859 Personal history of malignant neoplasm, unspecified: Secondary | ICD-10-CM

## 2018-03-23 DIAGNOSIS — N1330 Unspecified hydronephrosis: Secondary | ICD-10-CM

## 2018-03-23 LAB — BPAM RBC
Blood Product Expiration Date: 202004112359
ISSUE DATE / TIME: 202003151040
Unit Type and Rh: 5100

## 2018-03-23 LAB — GLUCOSE, CAPILLARY
Glucose-Capillary: 126 mg/dL — ABNORMAL HIGH (ref 70–99)
Glucose-Capillary: 176 mg/dL — ABNORMAL HIGH (ref 70–99)
Glucose-Capillary: 121 mg/dL — ABNORMAL HIGH (ref 70–99)

## 2018-03-23 LAB — TYPE AND SCREEN
ABO/RH(D): O POS
Antibody Screen: NEGATIVE
UNIT DIVISION: 0

## 2018-03-23 LAB — HIV ANTIBODY (ROUTINE TESTING W REFLEX): HIV Screen 4th Generation wRfx: NONREACTIVE

## 2018-03-23 NOTE — Progress Notes (Signed)
PROGRESS NOTE                                                                                                                                                                                                             Patient Demographics:    Andrew Joyce, is a 58 y.o. male, DOB - 1960/09/11, XMI:[id number]  Admit date - 03/21/2018   Admitting Physician Shela Leff, MD  Outpatient Primary MD for the patient is [name], NP  LOS - 2   Chief Complaint  Patient presents with   Fever   Nephrostomy Tube clogged       Brief Narrative    Andrew Joyce is a 58 y.o. male with medical history significant of CAD status post CABG, nephrolithiasis, high-grade urothelial renal carcinoma, bilateral nephrostomy tubes, recurrent nephrostomy tube malfunction, urinary bladder-abdominal wall fistula, chronic pain presenting to the hospital for evaluation of clogged nephrostomy tube and fever.  Patient states for the past 1 day his left nephrostomy tube has been blocked and the right tube has been partially blocked.  He has noticed a lot of urine leaking from his chronic bladder-abdominal wall fistula.  Reports having bilateral flank pain, fevers, and chills.  States his nephrostomy tubes were initially placed in July 2019 and have been replaced multiple times since then.  He is followed by urology at [facility].  His left nephrostomy tube was most recently replaced in February 2020.  Patient with known history of recurrent renal stones nephrostomy tube obstruction, nephrostomy tubes were exchanged by IR 03/22/2018, work-up significant for bacteremia   Subjective:    Andrew Joyce today denies any abdominal pain, flank pain, fever or chills    Assessment  & Plan :    Principal Problem:   Pyelonephritis Active Problems:   CKD (chronic kidney disease) stage 3, GFR 30-59 ml/min (HCC)   Hydronephrosis   Sepsis ([place])   Anemia   Type 2 diabetes  mellitus ([place])   Sepsis secondary to bacteremia/pyelonephritis, from severe bilateral hydronephrosis secondary to nephrostomy tube malfunction -Sepsis present on admission, as patient is febrile, 3.2, tachycardic 128, tachypneic at 27. -Patient with history of high-grade urothelial renal carcinoma, bilateral nephrostomy tubes, most of his urology care at [facility]. presenting with a 1 day history of fevers, chills, bilateral flank pain, and complaints of complete left nephrostomy tube blockage  and partial right nephrostomy tube blockage.  Reports with history of recurrent renal stone causing such incident in the past. - started empirically on cefepime, has bacteremia, significant for Klebsiella pneumonia, follow on drug sensitivity . -With history of renal calculi, causing frequent obstruction of his nephrostomies per patient, tender to coronavirus emergency, [facility] not accepting any patients, IR consulted, they will exchange his bilateral nephrostomy tubes today. -IV morphine 1 mg every 3 hours as needed for pain -Tylenol PRN  Invasive urothelial carcinoma  -In the setting of congenital bladder exstrophy, this has been followed by his physician at [facility] urology/oncology. -With minimal amount of purulent discharge today on physical exam, have discussed findings with Dr. Abigail Butts from urology, who reviewed his CT abdomen pelvis, there is no evidence of fluid collection need any drainage, just needing antibiotic coverage should be sufficient.  Chronic normocytic anemia -Hemoglobin 6.8 this morning, transfuse 1 unit PRBC  CKD 3 -BUN 28 and creatinine 1.5.  Renal function at baseline. -Continue to monitor BMP and urine output via nephrostomy tubes  Type 2 diabetes -Last A1c 7.7 in July 2019.  Repeat A1c.  Sliding scale insulin and CBG checks.  Gout -Continue allopurinol  Hypothyroidism -Continue Synthroid  Hyperlipidemia -Continue Crestor    Code Status : Full  Family Communication  : None  at bedside  Disposition Plan  : home when stable  Barriers For Discharge : Bacteremia, on IV antibiotics,  Consults  :  IR  Procedures  : none  DVT Prophylaxis  :  SCD, he was encouraged to ambulate  Lab Results  Component Value Date   PLT 212 03/22/2018    Antibiotics  :    Anti-infectives (From admission, onward)   Start     Dose/Rate Route Frequency Ordered Stop   03/22/18 0600  ceFEPIme (MAXIPIME) 2 g in sodium chloride 0.9 % 100 mL IVPB     2 g 200 mL/hr over 30 Minutes Intravenous Every 12 hours 03/22/18 0202     03/21/18 1545  ceFEPIme (MAXIPIME) 2 g in sodium chloride 0.9 % 100 mL IVPB     2 g 200 mL/hr over 30 Minutes Intravenous  Once 03/21/18 1530 03/21/18 1729        Objective:   Vitals:   03/22/18 2105 03/23/18 0045 03/23/18 0650 03/23/18 1315  BP: 120/67 123/70 115/75 112/71  Pulse: 82 80 70 68  Resp: 16 16 16 16   Temp: 99.8 F (37.7 C) 99.8 F (37.7 C) 98.6 F (37 C) 98.5 F (36.9 C)  TempSrc: Oral Oral Oral Oral  SpO2: 100% 97% 99% 100%  Weight:      Height:        Wt Readings from Last 3 Encounters:  03/21/18 62.8 kg  02/25/18 60.8 kg  12/17/17 66.6 kg     Intake/Output Summary (Last 24 hours) at 03/23/2018 1431 Last data filed at 03/23/2018 1210 Gross per 24 hour  Intake 1230.59 ml  Output 1450 ml  Net -219.41 ml     Physical Exam  Awake Alert, Oriented X 3, No new F.N deficits, Normal affect Symmetrical Chest wall movement, Good air movement bilaterally, CTAB RRR,No Gallops,Rubs or new Murmurs, No Parasternal Heave +ve B.Sounds, Abd Soft, bilateral nephrostomy tubes draining light yellow-colored urine, patient with pubic mass with very minimal discharge once squeezed, patient reports this is at baseline No Cyanosis, Clubbing or edema, No new Rash or bruise     CBC Recent Labs  Lab 03/21/18 1447 03/22/18 0527 03/22/18 1831  WBC  6.6 8.9 8.5  HGB 7.5* 6.8* 8.0*  HCT 25.6* 22.7* 26.7*  PLT 219 180 212  MCV 98.1 97.4 94.7    MCH 28.7 29.2 28.4  MCHC 29.3* 30.0 30.0  RDW 19.6* 19.4* 19.9*  LYMPHSABS 0.4*  --   --   MONOABS 0.5  --   --   EOSABS 0.0  --   --   BASOSABS 0.0  --   --     Chemistries  Recent Labs  Lab 03/21/18 1447 03/22/18 0527  NA 139 135  K 4.2 3.8  CL 103 103  CO2 26 23  GLUCOSE 168* 232*  BUN 28* 27*  CREATININE 1.56* 1.52*  CALCIUM 9.5 8.8*  AST 16  --   ALT 10  --   ALKPHOS 56  --   BILITOT 0.3  --    ------------------------------------------------------------------------------------------------------------------ No results for input(s): CHOL, HDL, LDLCALC, TRIG, CHOLHDL, LDLDIRECT in the last 72 hours.  Lab Results  Component Value Date   HGBA1C 5.8 (H) 03/22/2018   ------------------------------------------------------------------------------------------------------------------ No results for input(s): TSH, T4TOTAL, T3FREE, THYROIDAB in the last 72 hours.  Invalid input(s): FREET3 ------------------------------------------------------------------------------------------------------------------ No results for input(s): VITAMINB12, FOLATE, FERRITIN, TIBC, IRON, RETICCTPCT in the last 72 hours.  Coagulation profile Recent Labs  Lab 03/21/18 1447  INR 1.1    No results for input(s): DDIMER in the last 72 hours.  Cardiac Enzymes No results for input(s): CKMB, TROPONINI, MYOGLOBIN in the last 168 hours.  Invalid input(s): CK ------------------------------------------------------------------------------------------------------------------ No results found for: BNP  Inpatient Medications  Scheduled Meds:  allopurinol  100 mg Oral Daily   baclofen  20 mg Oral TID   carbamazepine  200 mg Oral BID   DULoxetine  20 mg Oral QHS   fenofibrate  160 mg Oral Daily   insulin aspart  0-9 Units Subcutaneous TID WC   levothyroxine  25 mcg Oral Q0600   rOPINIRole  0.25 mg Oral QHS   rosuvastatin  40 mg Oral Daily   sodium chloride flush  3 mL Intravenous  Once   sodium chloride flush  5 mL Intracatheter Q8H   Continuous Infusions:  sodium chloride 75 mL/hr at 03/23/18 1200   ceFEPime (MAXIPIME) IV 2 g (03/23/18 0458)   PRN Meds:.acetaminophen **OR** acetaminophen, morphine injection  Micro Results Recent Results (from the past 240 hour(s))  Culture, blood (Routine x 2)     Status: Abnormal (Preliminary result)   Collection Time: 03/21/18  2:47 PM  Result Value Ref Range Status   Specimen Description   Final    RIGHT ANTECUBITAL Performed at [facility], [place] [address], [place], [place] [address]    Special Requests   Final    BOTTLES DRAWN AEROBIC AND ANAEROBIC Blood Culture results may not be optimal due to an excessive volume of blood received in culture bottles Performed at [place] [address], [place], [place] [address]    Culture  Setup Time GRAM NEGATIVE RODS ANAEROBIC BOTTLE ONLY   Final   Culture KLEBSIELLA PNEUMONIAE (A)  Final   Report Status PENDING  Incomplete  Blood Culture ID Panel (Reflexed)     Status: Abnormal   Collection Time: 03/21/18  2:47 PM  Result Value Ref Range Status   Enterococcus species NOT DETECTED NOT DETECTED Final   Listeria monocytogenes NOT DETECTED NOT DETECTED Final   Staphylococcus species NOT DETECTED NOT DETECTED Final   Staphylococcus aureus (BCID) NOT DETECTED NOT DETECTED Final   Streptococcus species NOT  DETECTED NOT DETECTED Final   Streptococcus agalactiae NOT DETECTED NOT DETECTED Final   Streptococcus pneumoniae NOT DETECTED NOT DETECTED Final   Streptococcus pyogenes NOT DETECTED NOT DETECTED Final   Acinetobacter baumannii NOT DETECTED NOT DETECTED Final   Enterobacteriaceae species DETECTED (A) NOT DETECTED Final    Comment: Enterobacteriaceae represent a large family of gram-negative bacteria, not a single organism. CRITICAL RESULT CALLED TO, READ BACK BY AND VERIFIED WITH: M. BELL, PHARMD (WL) AT 0086 ON 03/22/18 BY C.  JESSUP, MLT.    Enterobacter cloacae complex NOT DETECTED NOT DETECTED Final   Escherichia coli NOT DETECTED NOT DETECTED Final   Klebsiella oxytoca NOT DETECTED NOT DETECTED Final   Klebsiella pneumoniae DETECTED (A) NOT DETECTED Final    Comment: CRITICAL RESULT CALLED TO, READ BACK BY AND VERIFIED WITH: M. BELL, PHARMD (WL) AT 1105 ON 03/22/18 BY C. JESSUP, MLT.    Proteus species NOT DETECTED NOT DETECTED Final   Serratia marcescens NOT DETECTED NOT DETECTED Final   Carbapenem resistance NOT DETECTED NOT DETECTED Final   Haemophilus influenzae NOT DETECTED NOT DETECTED Final   Neisseria meningitidis NOT DETECTED NOT DETECTED Final   Pseudomonas aeruginosa NOT DETECTED NOT DETECTED Final   Candida albicans NOT DETECTED NOT DETECTED Final   Candida glabrata NOT DETECTED NOT DETECTED Final   Candida krusei NOT DETECTED NOT DETECTED Final   Candida parapsilosis NOT DETECTED NOT DETECTED Final   Candida tropicalis NOT DETECTED NOT DETECTED Final    Comment: Performed at [facility], [place] [address] [place], [place] [address]  Culture, blood (Routine x 2)     Status: Abnormal (Preliminary result)   Collection Time: 03/21/18  2:52 PM  Result Value Ref Range Status   Specimen Description   Final    BLOOD RIGHT FOREARM Performed at [place] [address], [place], [place] [address]    Special Requests   Final    BOTTLES DRAWN AEROBIC AND ANAEROBIC Blood Culture adequate volume Performed at [place] [address], [place], [place] [address]    Culture  Setup Time   Final    GRAM NEGATIVE RODS IN BOTH AEROBIC AND ANAEROBIC BOTTLES Performed at [facility], [place] [address], [place], [place] [address]    Culture KLEBSIELLA PNEUMONIAE (A)  Final   Report Status PENDING  Incomplete    Radiology Reports Ct Renal Stone Study  Result Date: 03/21/2018 CLINICAL DATA:  History of bladder exstrophy. Pelvic pain and fever.  Reported obstruction of right nephrostomy catheter EXAM: CT ABDOMEN AND PELVIS WITHOUT CONTRAST TECHNIQUE: Multidetector CT imaging of the abdomen and pelvis was performed following the standard protocol without oral or IV contrast. COMPARISON:  November 15, 2015 FINDINGS: Lower chest: There is bibasilar atelectasis. There is no lung base edema or consolidation. Hepatobiliary: No focal liver lesions are appreciable on this noncontrast enhanced study. Gallbladder wall is not appreciably thickened. There is no biliary duct dilatation. Pancreas: No pancreatic mass or inflammatory focus. Spleen: No focal splenic lesions.  Small splenules noted medially. Adrenals/Urinary Tract: Adrenals bilaterally appear normal. There are bilateral nephrostomy catheters. There is severe dilatation of each collecting system, somewhat more severe on the right than on the left. A small amount of air is noted within an upper pole calyx on the left, likely secondary to the nephrostomy placement. There is a calculus in the lower pole of the left kidney measuring 7 x 5 mm. There is a 1 mm calculus in  the lower pole left kidney. There is a cyst arising from the upper pole of the right kidney medially measuring 2.7 x 1.8 cm. There is no appreciable perinephric fluid. There is persistent marked dilatation of each ureter, a stable finding. The ureters insert ectopically along the urinary bladder on each side, likely secondary to the history of bladder exstrophy. No ureteral mass or calculus is demonstrable. There is diffuse opacification in the urinary bladder, likely hemorrhage. 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 There is diffuse soft tissue thickening in this area. Question inflammatory change versus neoplasm involving the urinary bladder. Stomach/Bowel: There is moderate stool in the colon. There is no appreciable bowel wall or mesenteric thickening. No evident bowel obstruction. There is no free air or  portal venous air. Vascular/Lymphatic: There is no abdominal aortic aneurysm. There are foci of arterial vascular calcification in the aorta and proximal right common iliac artery. There is a prominent left inguinal lymph node measuring 1.4 x 1.4 cm. No other adenopathy is appreciable. Reproductive: Prostate and seminal vesicles appear grossly normal on this noncontrast enhanced study. Other: No appendiceal inflammation evident. No frank abscess or ascites appreciable in the abdomen or pelvis. Musculoskeletal: Marked widening of the pubic symphysis is noted consistent with history of bladder exstrophy. No blastic or lytic bone lesions are identified. There is disc narrowing with arthropathy at L5-S1. No intramuscular lesions are evident. IMPRESSION: 1. 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 Diffuse opacity in urinary bladder is noted as well as areas of air in the bladder, likely due to the fistula. Suspect hemorrhage within the bladder, although there may be neoplasm superimposed. Question abdominal wall thickening due to inflammation versus neoplasm. Both entities may be present. Clinical assessment of this area advised. A well-defined abscess is not seen in this area, although there does appear to be some loculated fluid in the rectus muscle immediately adjacent to the fistula. Note that inflammation extends inferiorly to the level of the base of the penis. Neoplastic involvement in this area cannot be excluded on this noncontrast enhanced study. 2. Nephrostomies catheters bilaterally. There is severe hydronephrosis despite presence of the nephrostomy catheters. There is marked ureterectasis bilaterally with ectopic insertions of each ureter into the bladder consistent with history of exstrophy. No ureteral calculi or ureteral masses evident. There are nonobstructing calculi in the left kidney.  There is no perinephric fluid on either side. 3. No evident bowel obstruction. No appreciable abscess in the abdomen or pelvis. Appendix region appears normal. 4. Prominent left inguinal lymph node. This lymph node enlargement could have either inflammatory or neoplastic etiology. 5. Marked widening of the pubic symphysis, a finding felt to be indicative of the known bladder exstrophy. 6.  Aortic atherosclerosis. Electronically Signed   By: Lowella Grip III M.D.   On: 03/21/2018 17:07   Ir Nephrostomy Exchange Left  Result Date: 03/22/2018 INDICATION: History of bladder exstrophy, now with chronic bilateral percutaneous nephrostomy catheters now with concern for bilateral percutaneous nephrostomy catheter extraction. Please perform fluoroscopic guided exchange and potential up sizing. EXAM: FLUOROSCOPIC GUIDED BILATERAL SIDED NEPHROSTOMY CATHETER EXCHANGE COMPARISON:  CT abdomen pelvis-03/21/2018; 11/15/2015 CONTRAST:  A total of 20 mL Isovue-300 administered was administered into both collecting systems FLUOROSCOPY TIME:  2 minutes, 12 seconds COMPLICATIONS: None immediate. TECHNIQUE: Informed written consent was obtained from the patient after a discussion of the risks, benefits and  alternatives to treatment. Questions regarding the procedure were encouraged and answered. A timeout was performed prior to the initiation of the procedure. The bilateral flanks and external portions of existing nephrostomy catheters were prepped and draped in the usual sterile fashion. A sterile drape was applied covering the operative field. Maximum barrier sterile technique with sterile gowns and gloves were used for the procedure. A timeout was performed prior to the initiation of the procedure. A pre procedural spot fluoroscopic image was obtained. Beginning with the left-sided nephrostomy, a small amount of contrast was injected via the existing left-sided nephrostomy catheter demonstrating appropriate positioning within  the renal pelvis. The existing nephrostomy catheter was cut and cannulated with a short Amplatz wire however note was made of grade difficulty advancing the Amplatz wire through the entirety of the nephrostomy catheter. Ultimately, a stiff glidewire was utilized to cannulate the entirety of the nephrostomy catheter. Under intermittent fluoroscopic guidance, the existing nephrostomy catheter was exchanged for a short Kumpe catheter. Contrast injection confirmed appropriate positioning. Next, under intermittent fluoroscopic guidance, the Kumpe catheter was exchanged for a new, slightly larger now 10.2 [place] all-purpose drainage catheter. Limited contrast injection confirmed appropriate positioning within the left renal pelvis and a post exchange fluoroscopic image was obtained. The catheter was locked, secured to the skin with an interrupted suture and reconnected to a gravity bag. The identical repeat procedure was repeated for the contralateral right-sided nephrostomy, ultimately allowing successful exchange of a new 10.2 [place] all-purpose drainage catheter with end coiled and locked within the right renal pelvis. Dressings were placed. The patient tolerated the above procedures well without immediate postprocedural complication. FINDINGS: Left-sided nephrostomy catheter is appropriately positioned though is nearly occluded with debris. After fluoroscopic guided exchange, the new, slightly larger now 10 French percutaneous drainage catheter is appropriately positioned with end coiled and locked within left renal pelvis. The right-sided nephrostomy catheter has been slightly retracted partially within an outside the right renal collecting system. After fluoroscopic guided exchange and up sizing, the new, slightly larger now 10 French percutaneous drainage catheter is appropriately positioned with end coiled and locked within the right renal pelvis. IMPRESSION: Successful fluoroscopic guided exchange and up sizing  of bilateral 10.2 French percutaneous nephrostomy catheters. Electronically Signed   By: Sandi Mariscal M.D.   On: 03/22/2018 19:30   Ir Nephrostomy Exchange Right  Result Date: 03/22/2018 INDICATION: History of bladder exstrophy, now with chronic bilateral percutaneous nephrostomy catheters now with concern for bilateral percutaneous nephrostomy catheter extraction. Please perform fluoroscopic guided exchange and potential up sizing. EXAM: FLUOROSCOPIC GUIDED BILATERAL SIDED NEPHROSTOMY CATHETER EXCHANGE COMPARISON:  CT abdomen pelvis-03/21/2018; 11/15/2015 CONTRAST:  A total of 20 mL Isovue-300 administered was administered into both collecting systems FLUOROSCOPY TIME:  2 minutes, 12 seconds COMPLICATIONS: None immediate. TECHNIQUE: Informed written consent was obtained from the patient after a discussion of the risks, benefits and alternatives to treatment. Questions regarding the procedure were encouraged and answered. A timeout was performed prior to the initiation of the procedure. The bilateral flanks and external portions of existing nephrostomy catheters were prepped and draped in the usual sterile fashion. A sterile drape was applied covering the operative field. Maximum barrier sterile technique with sterile gowns and gloves were used for the procedure. A timeout was performed prior to the initiation of the procedure. A pre procedural spot fluoroscopic image was obtained. Beginning with the left-sided nephrostomy, a small amount of contrast was injected via the existing left-sided nephrostomy catheter demonstrating appropriate positioning within the renal pelvis. The  existing nephrostomy catheter was cut and cannulated with a short Amplatz wire however note was made of grade difficulty advancing the Amplatz wire through the entirety of the nephrostomy catheter. Ultimately, a stiff glidewire was utilized to cannulate the entirety of the nephrostomy catheter. Under intermittent fluoroscopic guidance, the  existing nephrostomy catheter was exchanged for a short Kumpe catheter. Contrast injection confirmed appropriate positioning. Next, under intermittent fluoroscopic guidance, the Kumpe catheter was exchanged for a new, slightly larger now 10.2 [place] all-purpose drainage catheter. Limited contrast injection confirmed appropriate positioning within the left renal pelvis and a post exchange fluoroscopic image was obtained. The catheter was locked, secured to the skin with an interrupted suture and reconnected to a gravity bag. The identical repeat procedure was repeated for the contralateral right-sided nephrostomy, ultimately allowing successful exchange of a new 10.2 [place] all-purpose drainage catheter with end coiled and locked within the right renal pelvis. Dressings were placed. The patient tolerated the above procedures well without immediate postprocedural complication. FINDINGS: Left-sided nephrostomy catheter is appropriately positioned though is nearly occluded with debris. After fluoroscopic guided exchange, the new, slightly larger now 10 French percutaneous drainage catheter is appropriately positioned with end coiled and locked within left renal pelvis. The right-sided nephrostomy catheter has been slightly retracted partially within an outside the right renal collecting system. After fluoroscopic guided exchange and up sizing, the new, slightly larger now 10 French percutaneous drainage catheter is appropriately positioned with end coiled and locked within the right renal pelvis. IMPRESSION: Successful fluoroscopic guided exchange and up sizing of bilateral 10.2 French percutaneous nephrostomy catheters. Electronically Signed   By: Sandi Mariscal M.D.   On: 03/22/2018 19:30     Phillips Climes M.D on 03/23/2018 at 2:31 PM  Between 7am to 7pm - Pager - [phone]  After 7pm go to [email or web address] - password [facility]  [facility] -  Office  [phone]

## 2018-03-24 DIAGNOSIS — N1339 Other hydronephrosis: Secondary | ICD-10-CM

## 2018-03-24 LAB — BASIC METABOLIC PANEL
Anion gap: 8 (ref 5–15)
BUN: 26 mg/dL — ABNORMAL HIGH (ref 6–20)
CO2: 25 mmol/L (ref 22–32)
Calcium: 8.7 mg/dL — ABNORMAL LOW (ref 8.9–10.3)
Chloride: 105 mmol/L (ref 98–111)
Creatinine, Ser: 1.26 mg/dL — ABNORMAL HIGH (ref 0.61–1.24)
GFR calc Af Amer: >60 mL/min (ref >60)
GFR calc non Af Amer: >60 mL/min (ref >60)
Glucose, Bld: 138 mg/dL — ABNORMAL HIGH (ref 70–99)
Potassium: 3.5 mmol/L (ref 3.5–5.1)
Sodium: 138 mmol/L (ref 135–145)

## 2018-03-24 LAB — CBC
HCT: 25.3 % — ABNORMAL LOW (ref 39.0–52.0)
Hemoglobin: 7.6 g/dL — ABNORMAL LOW (ref 13.0–17.0)
MCH: 28.6 pg (ref 26.0–34.0)
MCHC: 30 g/dL (ref 30.0–36.0)
MCV: 95.1 fL (ref 80.0–100.0)
Platelets: 161 10*3/uL (ref 150–400)
RBC: 2.66 MIL/uL — ABNORMAL LOW (ref 4.22–5.81)
RDW: 19.8 % — ABNORMAL HIGH (ref 11.5–15.5)
WBC: 4.7 10*3/uL (ref 4.0–10.5)
nRBC: 0 % (ref 0.0–0.2)

## 2018-03-24 LAB — GLUCOSE, CAPILLARY: Glucose-Capillary: 132 mg/dL — ABNORMAL HIGH (ref 70–99)

## 2018-03-24 MED ORDER — SODIUM CHLORIDE 0.9 % IV SOLN
2.0000 g | INTRAVENOUS | Status: DC
  Administered 2018-03-24 – 2018-03-27 (×4): 2 g via INTRAVENOUS
  Filled 2018-03-24 (×4): qty 2
  Filled 2018-03-24: qty 20

## 2018-03-24 NOTE — Progress Notes (Signed)
PROGRESS NOTE                                                                                                                                                                                                             Patient Demographics:    Andrew Joyce, is a 58 y.o. male, DOB - 09-30-1960, YKZ:[id number]  Admit date - 03/21/2018   Admitting Physician Shela Leff, MD  Outpatient Primary MD for the patient is [name], NP  LOS - 3   Chief Complaint  Patient presents with   Fever   Nephrostomy Tube clogged       Brief Narrative    Andrew Joyce is a 58 y.o. male with medical history significant of CAD status post CABG, nephrolithiasis, high-grade urothelial renal carcinoma, bilateral nephrostomy tubes, recurrent nephrostomy tube malfunction, urinary bladder-abdominal wall fistula, chronic pain presenting to the hospital for evaluation of clogged nephrostomy tube and fever.  Patient states for the past 1 day his left nephrostomy tube has been blocked and the right tube has been partially blocked.  He has noticed a lot of urine leaking from his chronic bladder-abdominal wall fistula.  Reports having bilateral flank pain, fevers, and chills.  States his nephrostomy tubes were initially placed in July 2019 and have been replaced multiple times since then.  He is followed by urology at [facility].  His left nephrostomy tube was most recently replaced in February 2020.  Patient with known history of recurrent renal stones nephrostomy tube obstruction, nephrostomy tubes were exchanged by IR 03/22/2018, work-up significant for bacteremia   Subjective:    Andrew Joyce today denies any abdominal pain, ports minimal right flank pain on ambulation, denies fever or chills, reports he is feeling much better   Assessment  & Plan :    Principal Problem:   Pyelonephritis Active Problems:   CKD (chronic kidney disease) stage 3, GFR 30-59  ml/min (HCC)   Hydronephrosis   Sepsis ([place])   Anemia   Type 2 diabetes mellitus ([place])   Sepsis secondary to bacteremia/pyelonephritis, from severe bilateral hydronephrosis secondary to nephrostomy tube malfunction -Sepsis present on admission, as patient is febrile, 3.2, tachycardic 128, tachypneic at 27. -Patient with history of high-grade urothelial renal carcinoma, bilateral nephrostomy tubes, most of his urology care at [facility]. presenting with a 1 day history of fevers, chills,  bilateral flank pain, and complaints of complete left nephrostomy tube blockage and partial right nephrostomy tube blockage.  Reports with history of recurrent renal stone causing such incident in the past. - started empirically on cefepime, has bacteremia, significant for Klebsiella pneumonia, he has been changed to Rocephin 03/24/2018. -With history of renal calculi, causing frequent obstruction of his nephrostomies per patient, tender to coronavirus emergency, [facility] not accepting any patients, IR consulted, did trend bilateral nephrostomy tubes 03/22/2018, left nephrostomy tube output is much higher than the right, patient reports this has been his baseline,. -IV morphine 1 mg every 3 hours as needed for pain -Tylenol PRN  Invasive urothelial carcinoma  -In the setting of congenital bladder exstrophy, this has been followed by his physician at [facility] urology/oncology. -With minimal amount of purulent discharge today on physical exam, have discussed findings with Dr. Abigail Butts from urology, who reviewed his CT abdomen pelvis, there is no evidence of fluid collection need any drainage, just needing antibiotic coverage should be sufficient.  Chronic normocytic anemia -Hemoglobin 6.8 this morning, transfuse 1 unit PRBC  CKD 3 -BUN 28 and creatinine 1.5.  Renal function at baseline. -Continue to monitor BMP and urine output via nephrostomy tubes  Type 2 diabetes -Last A1c 7.7 in July 2019.  Repeat A1c.  Sliding scale insulin  and CBG checks.  Gout -Continue allopurinol  Hypothyroidism -Continue Synthroid  Hyperlipidemia -Continue Crestor    Code Status : Full  Family Communication  : None at bedside  Disposition Plan  : home when stable  Barriers For Discharge : Bacteremia, on IV antibiotics,  Consults  :  IR  Procedures  : Lateral nephrostomy tube change by IR 03/22/2018  DVT Prophylaxis  :  SCD, he was encouraged to ambulate, he is ambulating multiple times in the hallway every day  Lab Results  Component Value Date   PLT 161 03/24/2018    Antibiotics  :    Anti-infectives (From admission, onward)   Start     Dose/Rate Route Frequency Ordered Stop   03/24/18 1800  cefTRIAXone (ROCEPHIN) 2 g in sodium chloride 0.9 % 100 mL IVPB     2 g 200 mL/hr over 30 Minutes Intravenous Every 24 hours 03/24/18 1136     03/22/18 0600  ceFEPIme (MAXIPIME) 2 g in sodium chloride 0.9 % 100 mL IVPB  Status:  Discontinued     2 g 200 mL/hr over 30 Minutes Intravenous Every 12 hours 03/22/18 0202 03/24/18 1136   03/21/18 1545  ceFEPIme (MAXIPIME) 2 g in sodium chloride 0.9 % 100 mL IVPB     2 g 200 mL/hr over 30 Minutes Intravenous  Once 03/21/18 1530 03/21/18 1729        Objective:   Vitals:   03/23/18 1315 03/23/18 2112 03/24/18 0518 03/24/18 1449  BP: 112/71 119/71 126/77 127/80  Pulse: 68 62 65 (!) 59  Resp: 16 18 18 16   Temp: 98.5 F (36.9 C) 98 F (36.7 C) 98.1 F (36.7 C) 98.4 F (36.9 C)  TempSrc: Oral  Oral Oral  SpO2: 100% 100% 97% 100%  Weight:      Height:        Wt Readings from Last 3 Encounters:  03/21/18 62.8 kg  02/25/18 60.8 kg  12/17/17 66.6 kg     Intake/Output Summary (Last 24 hours) at 03/24/2018 1614 Last data filed at 03/24/2018 1500 Gross per 24 hour  Intake 1272.5 ml  Output 2975 ml  Net -1702.5 ml  Physical Exam  Awake Alert, Oriented X 3, No new F.N deficits, Normal affect Symmetrical Chest wall movement, Good air movement bilaterally,  CTAB RRR,No Gallops,Rubs or new Murmurs, No Parasternal Heave +ve B.Sounds, Abd Soft, bilateral nephrostomy tubes draining light yellow-colored urine, (800 in  the left, 100 in the right )patient with pubic mass , patient reports this is at baseline No Cyanosis, Clubbing or edema, No new Rash or bruise     CBC Recent Labs  Lab 03/21/18 1447 03/22/18 0527 03/22/18 1831 03/24/18 0527  WBC 6.6 8.9 8.5 4.7  HGB 7.5* 6.8* 8.0* 7.6*  HCT 25.6* 22.7* 26.7* 25.3*  PLT 219 180 212 161  MCV 98.1 97.4 94.7 95.1  MCH 28.7 29.2 28.4 28.6  MCHC 29.3* 30.0 30.0 30.0  RDW 19.6* 19.4* 19.9* 19.8*  LYMPHSABS 0.4*  --   --   --   MONOABS 0.5  --   --   --   EOSABS 0.0  --   --   --   BASOSABS 0.0  --   --   --     Chemistries  Recent Labs  Lab 03/21/18 1447 03/22/18 0527 03/24/18 0527  NA 139 135 138  K 4.2 3.8 3.5  CL 103 103 105  CO2 26 23 25   GLUCOSE 168* 232* 138*  BUN 28* 27* 26*  CREATININE 1.56* 1.52* 1.26*  CALCIUM 9.5 8.8* 8.7*  AST 16  --   --   ALT 10  --   --   ALKPHOS 56  --   --   BILITOT 0.3  --   --    ------------------------------------------------------------------------------------------------------------------ No results for input(s): CHOL, HDL, LDLCALC, TRIG, CHOLHDL, LDLDIRECT in the last 72 hours.  Lab Results  Component Value Date   HGBA1C 5.8 (H) 03/22/2018   ------------------------------------------------------------------------------------------------------------------ No results for input(s): TSH, T4TOTAL, T3FREE, THYROIDAB in the last 72 hours.  Invalid input(s): FREET3 ------------------------------------------------------------------------------------------------------------------ No results for input(s): VITAMINB12, FOLATE, FERRITIN, TIBC, IRON, RETICCTPCT in the last 72 hours.  Coagulation profile Recent Labs  Lab 03/21/18 1447  INR 1.1    No results for input(s): DDIMER in the last 72 hours.  Cardiac Enzymes No results for  input(s): CKMB, TROPONINI, MYOGLOBIN in the last 168 hours.  Invalid input(s): CK ------------------------------------------------------------------------------------------------------------------ No results found for: BNP  Inpatient Medications  Scheduled Meds:  allopurinol  100 mg Oral Daily   baclofen  20 mg Oral TID   carbamazepine  200 mg Oral BID   DULoxetine  20 mg Oral QHS   fenofibrate  160 mg Oral Daily   levothyroxine  25 mcg Oral Q0600   rOPINIRole  0.25 mg Oral QHS   rosuvastatin  40 mg Oral Daily   sodium chloride flush  3 mL Intravenous Once   sodium chloride flush  5 mL Intracatheter Q8H   Continuous Infusions:  sodium chloride 75 mL/hr at 03/24/18 0527   cefTRIAXone (ROCEPHIN)  IV     PRN Meds:.acetaminophen **OR** acetaminophen, morphine injection  Micro Results Recent Results (from the past 240 hour(s))  Culture, blood (Routine x 2)     Status: Abnormal (Preliminary result)   Collection Time: 03/21/18  2:47 PM  Result Value Ref Range Status   Specimen Description   Final    RIGHT ANTECUBITAL Performed at [facility], [place] [address], [place], [place] [address]    Special Requests   Final    BOTTLES DRAWN AEROBIC AND ANAEROBIC Blood Culture results may not be optimal due to  an excessive volume of blood received in culture bottles Performed at [facility] [address], [place], [place] [address]    Culture  Setup Time   Final    GRAM NEGATIVE RODS IN BOTH AEROBIC AND ANAEROBIC BOTTLES CRITICAL RESULT CALLED TO, READ BACK BY AND VERIFIED WITH: M. BELL, PHARMD (WL) AT 1914 ON 03/22/18 BY C. JESSUP, MLT.    Culture (A)  Final    KLEBSIELLA PNEUMONIAE CULTURE REINCUBATED FOR BETTER GROWTH Performed at [facility], [place] [address] [place], [place] [address]    Report Status PENDING  Incomplete   Organism ID, Bacteria KLEBSIELLA PNEUMONIAE  Final      Susceptibility   Klebsiella pneumoniae -  MIC*    AMPICILLIN >=32 RESISTANT Resistant     CEFAZOLIN <=4 SENSITIVE Sensitive     CEFEPIME <=1 SENSITIVE Sensitive     CEFTAZIDIME <=1 SENSITIVE Sensitive     CEFTRIAXONE <=1 SENSITIVE Sensitive     CIPROFLOXACIN <=0.25 SENSITIVE Sensitive     GENTAMICIN <=1 SENSITIVE Sensitive     IMIPENEM <=0.25 SENSITIVE Sensitive     TRIMETH/SULFA <=20 SENSITIVE Sensitive     AMPICILLIN/SULBACTAM 16 INTERMEDIATE Intermediate     PIP/TAZO <=4 SENSITIVE Sensitive     Extended ESBL NEGATIVE Sensitive     * KLEBSIELLA PNEUMONIAE  Blood Culture ID Panel (Reflexed)     Status: Abnormal   Collection Time: 03/21/18  2:47 PM  Result Value Ref Range Status   Enterococcus species NOT DETECTED NOT DETECTED Final   Listeria monocytogenes NOT DETECTED NOT DETECTED Final   Staphylococcus species NOT DETECTED NOT DETECTED Final   Staphylococcus aureus (BCID) NOT DETECTED NOT DETECTED Final   Streptococcus species NOT DETECTED NOT DETECTED Final   Streptococcus agalactiae NOT DETECTED NOT DETECTED Final   Streptococcus pneumoniae NOT DETECTED NOT DETECTED Final   Streptococcus pyogenes NOT DETECTED NOT DETECTED Final   Acinetobacter baumannii NOT DETECTED NOT DETECTED Final   Enterobacteriaceae species DETECTED (A) NOT DETECTED Final    Comment: Enterobacteriaceae represent a large family of gram-negative bacteria, not a single organism. CRITICAL RESULT CALLED TO, READ BACK BY AND VERIFIED WITH: M. BELL, PHARMD (WL) AT 6213 ON 03/22/18 BY C. JESSUP, MLT.    Enterobacter cloacae complex NOT DETECTED NOT DETECTED Final   Escherichia coli NOT DETECTED NOT DETECTED Final   Klebsiella oxytoca NOT DETECTED NOT DETECTED Final   Klebsiella pneumoniae DETECTED (A) NOT DETECTED Final    Comment: CRITICAL RESULT CALLED TO, READ BACK BY AND VERIFIED WITH: M. BELL, PHARMD (WL) AT 1105 ON 03/22/18 BY C. JESSUP, MLT.    Proteus species NOT DETECTED NOT DETECTED Final   Serratia marcescens NOT DETECTED NOT DETECTED Final    Carbapenem resistance NOT DETECTED NOT DETECTED Final   Haemophilus influenzae NOT DETECTED NOT DETECTED Final   Neisseria meningitidis NOT DETECTED NOT DETECTED Final   Pseudomonas aeruginosa NOT DETECTED NOT DETECTED Final   Candida albicans NOT DETECTED NOT DETECTED Final   Candida glabrata NOT DETECTED NOT DETECTED Final   Candida krusei NOT DETECTED NOT DETECTED Final   Candida parapsilosis NOT DETECTED NOT DETECTED Final   Candida tropicalis NOT DETECTED NOT DETECTED Final    Comment: Performed at [facility], [place] [address] [place], [place] [address]  Culture, blood (Routine x 2)     Status: Abnormal (Preliminary result)   Collection Time: 03/21/18  2:52 PM  Result Value Ref Range Status   Specimen Description   Final  BLOOD RIGHT FOREARM Performed at [facility] [address] [place], [place] [address]    Special Requests   Final    BOTTLES DRAWN AEROBIC AND ANAEROBIC Blood Culture adequate volume Performed at [facility] [address], [place], [place] [address]    Culture  Setup Time   Final    GRAM NEGATIVE RODS IN BOTH AEROBIC AND ANAEROBIC BOTTLES    Culture (A)  Final    KLEBSIELLA PNEUMONIAE SUSCEPTIBILITIES PERFORMED ON PREVIOUS CULTURE WITHIN THE LAST 5 DAYS. CULTURE REINCUBATED FOR BETTER GROWTH Performed at [facility], [place] [address] [place], [place] [address]    Report Status PENDING  Incomplete  Urine culture     Status: Abnormal (Preliminary result)   Collection Time: 03/21/18  3:28 PM  Result Value Ref Range Status   Specimen Description   Final    URINE, RANDOM Performed at [facility] [address] [place], [place] [address]    Special Requests   Final    NONE Performed at [facility], [place] [address] [place], [place] [address]    Culture (A)  Final    >=100,000 COLONIES/mL GRAM NEGATIVE RODS IDENTIFICATION AND SUSCEPTIBILITIES TO  FOLLOW Performed at [facility], [place] [address] [place], [place] [address]    Report Status PENDING  Incomplete    Radiology Reports Ct Renal Stone Study  Result Date: 03/21/2018 CLINICAL DATA:  History of bladder exstrophy. Pelvic pain and fever. Reported obstruction of right nephrostomy catheter EXAM: CT ABDOMEN AND PELVIS WITHOUT CONTRAST TECHNIQUE: Multidetector CT imaging of the abdomen and pelvis was performed following the standard protocol without oral or IV contrast. COMPARISON:  November 15, 2015 FINDINGS: Lower chest: There is bibasilar atelectasis. There is no lung base edema or consolidation. Hepatobiliary: No focal liver lesions are appreciable on this noncontrast enhanced study. Gallbladder wall is not appreciably thickened. There is no biliary duct dilatation. Pancreas: No pancreatic mass or inflammatory focus. Spleen: No focal splenic lesions.  Small splenules noted medially. Adrenals/Urinary Tract: Adrenals bilaterally appear normal. There are bilateral nephrostomy catheters. There is severe dilatation of each collecting system, somewhat more severe on the right than on the left. A small amount of air is noted within an upper pole calyx on the left, likely secondary to the nephrostomy placement. There is a calculus in the lower pole of the left kidney measuring 7 x 5 mm. There is a 1 mm calculus in the lower pole left kidney. There is a cyst arising from the upper pole of the right kidney medially measuring 2.7 x 1.8 cm. There is no appreciable perinephric fluid. There is persistent marked dilatation of each ureter, a stable finding. The ureters insert ectopically along the urinary bladder on each side, likely secondary to the history of bladder exstrophy. No ureteral mass or calculus is demonstrable. There is diffuse opacification in the urinary bladder, likely hemorrhage. 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 There is diffuse soft tissue  thickening in this area. Question inflammatory change versus neoplasm involving the urinary bladder. Stomach/Bowel: There is moderate stool in the colon. There is no appreciable bowel wall or mesenteric thickening. No evident bowel obstruction. There is no free air or portal venous air. Vascular/Lymphatic: There is no abdominal aortic aneurysm. There are foci of arterial vascular calcification in the aorta and proximal right common iliac artery. There is a prominent left inguinal lymph node measuring 1.4 x  1.4 cm. No other adenopathy is appreciable. Reproductive: Prostate and seminal vesicles appear grossly normal on this noncontrast enhanced study. Other: No appendiceal inflammation evident. No frank abscess or ascites appreciable in the abdomen or pelvis. Musculoskeletal: Marked widening of the pubic symphysis is noted consistent with history of bladder exstrophy. No blastic or lytic bone lesions are identified. There is disc narrowing with arthropathy at L5-S1. No intramuscular lesions are evident. IMPRESSION: 1. 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 Diffuse opacity in urinary bladder is noted as well as areas of air in the bladder, likely due to the fistula. Suspect hemorrhage within the bladder, although there may be neoplasm superimposed. Question abdominal wall thickening due to inflammation versus neoplasm. Both entities may be present. Clinical assessment of this area advised. A well-defined abscess is not seen in this area, although there does appear to be some loculated fluid in the rectus muscle immediately adjacent to the fistula. Note that inflammation extends inferiorly to the level of the base of the penis. Neoplastic involvement in this area cannot be excluded on this noncontrast enhanced study. 2. Nephrostomies catheters bilaterally. There is severe hydronephrosis despite presence  of the nephrostomy catheters. There is marked ureterectasis bilaterally with ectopic insertions of each ureter into the bladder consistent with history of exstrophy. No ureteral calculi or ureteral masses evident. There are nonobstructing calculi in the left kidney. There is no perinephric fluid on either side. 3. No evident bowel obstruction. No appreciable abscess in the abdomen or pelvis. Appendix region appears normal. 4. Prominent left inguinal lymph node. This lymph node enlargement could have either inflammatory or neoplastic etiology. 5. Marked widening of the pubic symphysis, a finding felt to be indicative of the known bladder exstrophy. 6.  Aortic atherosclerosis. Electronically Signed   By: Lowella Grip III M.D.   On: 03/21/2018 17:07   Ir Nephrostomy Exchange Left  Result Date: 03/22/2018 INDICATION: History of bladder exstrophy, now with chronic bilateral percutaneous nephrostomy catheters now with concern for bilateral percutaneous nephrostomy catheter extraction. Please perform fluoroscopic guided exchange and potential up sizing. EXAM: FLUOROSCOPIC GUIDED BILATERAL SIDED NEPHROSTOMY CATHETER EXCHANGE COMPARISON:  CT abdomen pelvis-03/21/2018; 11/15/2015 CONTRAST:  A total of 20 mL Isovue-300 administered was administered into both collecting systems FLUOROSCOPY TIME:  2 minutes, 12 seconds COMPLICATIONS: None immediate. TECHNIQUE: Informed written consent was obtained from the patient after a discussion of the risks, benefits and alternatives to treatment. Questions regarding the procedure were encouraged and answered. A timeout was performed prior to the initiation of the procedure. The bilateral flanks and external portions of existing nephrostomy catheters were prepped and draped in the usual sterile fashion. A sterile drape was applied covering the operative field. Maximum barrier sterile technique with sterile gowns and gloves were used for the procedure. A timeout was performed prior  to the initiation of the procedure. A pre procedural spot fluoroscopic image was obtained. Beginning with the left-sided nephrostomy, a small amount of contrast was injected via the existing left-sided nephrostomy catheter demonstrating appropriate positioning within the renal pelvis. The existing nephrostomy catheter was cut and cannulated with a short Amplatz wire however note was made of grade difficulty advancing the Amplatz wire through the entirety of the nephrostomy catheter. Ultimately, a stiff glidewire was utilized to cannulate the entirety of the nephrostomy catheter. Under intermittent fluoroscopic guidance, the existing nephrostomy catheter was exchanged for a short Kumpe catheter. Contrast injection confirmed  appropriate positioning. Next, under intermittent fluoroscopic guidance, the Kumpe catheter was exchanged for a new, slightly larger now 10.2 [place] all-purpose drainage catheter. Limited contrast injection confirmed appropriate positioning within the left renal pelvis and a post exchange fluoroscopic image was obtained. The catheter was locked, secured to the skin with an interrupted suture and reconnected to a gravity bag. The identical repeat procedure was repeated for the contralateral right-sided nephrostomy, ultimately allowing successful exchange of a new 10.2 [place] all-purpose drainage catheter with end coiled and locked within the right renal pelvis. Dressings were placed. The patient tolerated the above procedures well without immediate postprocedural complication. FINDINGS: Left-sided nephrostomy catheter is appropriately positioned though is nearly occluded with debris. After fluoroscopic guided exchange, the new, slightly larger now 10 French percutaneous drainage catheter is appropriately positioned with end coiled and locked within left renal pelvis. The right-sided nephrostomy catheter has been slightly retracted partially within an outside the right renal collecting system. After  fluoroscopic guided exchange and up sizing, the new, slightly larger now 10 French percutaneous drainage catheter is appropriately positioned with end coiled and locked within the right renal pelvis. IMPRESSION: Successful fluoroscopic guided exchange and up sizing of bilateral 10.2 French percutaneous nephrostomy catheters. Electronically Signed   By: Sandi Mariscal M.D.   On: 03/22/2018 19:30   Ir Nephrostomy Exchange Right  Result Date: 03/22/2018 INDICATION: History of bladder exstrophy, now with chronic bilateral percutaneous nephrostomy catheters now with concern for bilateral percutaneous nephrostomy catheter extraction. Please perform fluoroscopic guided exchange and potential up sizing. EXAM: FLUOROSCOPIC GUIDED BILATERAL SIDED NEPHROSTOMY CATHETER EXCHANGE COMPARISON:  CT abdomen pelvis-03/21/2018; 11/15/2015 CONTRAST:  A total of 20 mL Isovue-300 administered was administered into both collecting systems FLUOROSCOPY TIME:  2 minutes, 12 seconds COMPLICATIONS: None immediate. TECHNIQUE: Informed written consent was obtained from the patient after a discussion of the risks, benefits and alternatives to treatment. Questions regarding the procedure were encouraged and answered. A timeout was performed prior to the initiation of the procedure. The bilateral flanks and external portions of existing nephrostomy catheters were prepped and draped in the usual sterile fashion. A sterile drape was applied covering the operative field. Maximum barrier sterile technique with sterile gowns and gloves were used for the procedure. A timeout was performed prior to the initiation of the procedure. A pre procedural spot fluoroscopic image was obtained. Beginning with the left-sided nephrostomy, a small amount of contrast was injected via the existing left-sided nephrostomy catheter demonstrating appropriate positioning within the renal pelvis. The existing nephrostomy catheter was cut and cannulated with a short Amplatz  wire however note was made of grade difficulty advancing the Amplatz wire through the entirety of the nephrostomy catheter. Ultimately, a stiff glidewire was utilized to cannulate the entirety of the nephrostomy catheter. Under intermittent fluoroscopic guidance, the existing nephrostomy catheter was exchanged for a short Kumpe catheter. Contrast injection confirmed appropriate positioning. Next, under intermittent fluoroscopic guidance, the Kumpe catheter was exchanged for a new, slightly larger now 10.2 [place] all-purpose drainage catheter. Limited contrast injection confirmed appropriate positioning within the left renal pelvis and a post exchange fluoroscopic image was obtained. The catheter was locked, secured to the skin with an interrupted suture and reconnected to a gravity bag. The identical repeat procedure was repeated for the contralateral right-sided nephrostomy, ultimately allowing successful exchange of a new 10.2 [place] all-purpose drainage catheter with end coiled and locked within the right renal pelvis. Dressings were placed. The patient tolerated the above procedures well without immediate postprocedural complication. FINDINGS: Left-sided  nephrostomy catheter is appropriately positioned though is nearly occluded with debris. After fluoroscopic guided exchange, the new, slightly larger now 10 French percutaneous drainage catheter is appropriately positioned with end coiled and locked within left renal pelvis. The right-sided nephrostomy catheter has been slightly retracted partially within an outside the right renal collecting system. After fluoroscopic guided exchange and up sizing, the new, slightly larger now 10 French percutaneous drainage catheter is appropriately positioned with end coiled and locked within the right renal pelvis. IMPRESSION: Successful fluoroscopic guided exchange and up sizing of bilateral 10.2 French percutaneous nephrostomy catheters. Electronically Signed   By: Sandi Mariscal M.D.   On: 03/22/2018 19:30     Phillips Climes M.D on 03/24/2018 at 4:14 PM  Between 7am to 7pm - Pager - [phone]  After 7pm go to [email or web address] - password [facility]  [facility] -  Office  [phone]

## 2018-03-24 NOTE — Care Management Important Message (Signed)
Important Message  Patient Details  Name: Andrew Joyce MRN: [id number] Date of Birth: 19-Aug-1960   Medicare Important Message Given:  Yes    Kerin Salen 03/24/2018, 10:17 AMImportant Message  Patient Details  Name: Andrew Joyce MRN: [id number] Date of Birth: [birth year]   Medicare Important Message Given:  Yes    Kerin Salen 03/24/2018, 10:17 AM

## 2018-03-25 LAB — CULTURE, BLOOD (ROUTINE X 2): Special Requests: ADEQUATE

## 2018-03-25 MED ORDER — HEPARIN SODIUM (PORCINE) 5000 UNIT/ML IJ SOLN
5000.0000 [IU] | Freq: Three times a day (TID) | INTRAMUSCULAR | Status: DC
  Filled 2018-03-25 (×3): qty 1

## 2018-03-25 MED ORDER — FENTANYL 12 MCG/HR TD PT72
1.0000 | MEDICATED_PATCH | TRANSDERMAL | Status: DC
  Administered 2018-03-25: 1 via TRANSDERMAL
  Filled 2018-03-25: qty 1

## 2018-03-25 MED ORDER — AMLODIPINE BESYLATE 5 MG PO TABS: 5.0000 mg | ORAL_TABLET | Freq: Every day | ORAL | Status: DC

## 2018-03-25 NOTE — Progress Notes (Signed)
VAST RN assessed PIV; informed unit RN that IV tubing is not dated and needs to be corrected. She verbalized understanding.

## 2018-03-25 NOTE — Progress Notes (Signed)
Patient & spouse report that right groin area is worsening,  increased redness and firmness. When assessed this am pt & spouse reported no changes in wound. Will report to provider.

## 2018-03-25 NOTE — Progress Notes (Addendum)
PROGRESS NOTE    Andrew Joyce  QQI:[id number] DOB: 12-12-1960 DOA: 03/21/2018 PCP: [name], NP   Brief Narrative:  58 year old with past medical history relevant for history of bladder exstrophy status post surgery, urothelial renal cell carcinoma complicated by urinary bladder abdominal wall fistula, status post bilateral PCN tubes complicated by recurrent obstruction, nephrolithiasis, coronary artery disease status post CABG, hypothyroidism, gout, depression/anxiety, chronic pain who was admitted for sepsis and found to have Klebsiella bacteremia from a urinary source likely due to PCN tube malfunction as well as post renal AKI.   Assessment & Plan:   Principal Problem:   Pyelonephritis Active Problems:   CKD (chronic kidney disease) stage 3, GFR 30-59 ml/min (HCC)   Hydronephrosis   Sepsis ([employer])   Anemia   Type 2 diabetes mellitus ([place])   #) Sepsis due to Klebsiella bacteremia from urinary source: Sepsis is resolved with IV fluids, IV antibiotics - Continue IV ceftriaxone started 03/24/2018 - From 03/21/2018 growing a Klebsiella pneumonia sensitive to ceftriaxone  #) History of bladder exstrophy/urothelial cell cancer status post PCN tubes: Patient has had multiple problems with recurrent malfunctions of PCN tubes requiring exchange -Most recently exchanged on 03/22/2018 by interventional radiology  #) Bladder/abdominal wall fistula: This was thought to be inflammatory and likely secondary to possible cancer.  Patient reports this is improved with radiation -IV antibiotics per above -Per urology no need for any additional treatment at this time as patient is followed at [facility]  #) Post renal AKI: Resolved after exchanging PCN tubes and IV fluids  #) Type 2 diabetes: -Hold sitagliptin -Sliding scale insulin, AC at bedtime  #) Acute on chronic anemia: This is been a longstanding issue for the patient.  Suspect a combination of possible CKD as well as underlying  anemia of chronic disease. -Status post 1 packed red blood cell on 03/22/2018  #) Hypertension/hyperlipidemia/coronary artery disease status post CABG: -Hold isosorbide mononitrate -Hold losartan 100 mg daily - Continue rosuvastatin 40 mg daily - Hold amlodipine 5 mg daily -Continue fenofibrate 160 mg daily -Hold metoprolol tartrate 25 mg twice daily  #) Gout: -Continue allopurinol 100 mg daily  #) Chronic pain/psych: - Continue gabapentin 300 mg nightly - Continue 20 mg baclofen 3 times daily -Continue carbamazepine 200 mg twice daily -Continue duloxetine 20 mg nightly -Continue fentanyl patch  #) Hypothyroidism: -Continue levothyroxine 25 mcg  Fluids: Gentle IV fluids Electrolytes: Monitor and supplement Nutrition: Heart healthy carb restricted diet  Prophylaxis: Subcu heparin   Disposition: Pending improved clinical stability  Full code   Consultants:   Interventional radiology  Urology  Procedures:   Bilateral PCN tube exchange 03/22/2018  Antimicrobials:   IV ceftriaxone 03/23/2020 ongoing  IV cefepime 03/21/2020 to 03/24/2018   Subjective: This morning the patient reports that he has some suprapubic pain at the site of the fistulous connection between his bladder and his abdomen.  Otherwise he denies any nausea, vomiting, diarrhea, cough, congestion, rhinorrhea.  Objective: Vitals:   03/24/18 0518 03/24/18 1449 03/24/18 2118 03/25/18 0456  BP: 126/77 127/80 131/84 129/81  Pulse: 65 (!) 59 66 (!) 59  Resp: 18 16 18 18   Temp: 98.1 F (36.7 C) 98.4 F (36.9 C) 98.6 F (37 C) 98.7 F (37.1 C)  TempSrc: Oral Oral Oral Oral  SpO2: 97% 100% 99% 100%  Weight:      Height:        Intake/Output Summary (Last 24 hours) at 03/25/2018 1247 Last data filed at 03/25/2018 0500 Gross per  24 hour  Intake 620 ml  Output 3255 ml  Net -2635 ml   Filed Weights   03/21/18 2158  Weight: 62.8 kg    Examination:  General exam: Appears calm and comfortable   Respiratory system: Clear to auscultation. Respiratory effort normal. Cardiovascular system: Regular rate and rhythm, no murmurs Gastrointestinal system: Soft, nondistended, no rebound or guarding, plus bowel sounds. Central nervous system: Alert and oriented.  Grossly intact, moving all extremities Extremities: No lower extremity edema Skin: Well-healed abdominal incisions, suprapubic fistula site with some drainage and erythema surrounding it as well as some macerations, PCN tube sites are clean dry and intact Psychiatry: Judgement and insight appear normal. Mood & affect appropriate.     Data Reviewed: I have personally reviewed following labs and imaging studies  CBC: Recent Labs  Lab 03/21/18 1447 03/22/18 0527 03/22/18 1831 03/24/18 0527  WBC 6.6 8.9 8.5 4.7  NEUTROABS 5.7  --   --   --   HGB 7.5* 6.8* 8.0* 7.6*  HCT 25.6* 22.7* 26.7* 25.3*  MCV 98.1 97.4 94.7 95.1  PLT 219 180 212 811   Basic Metabolic Panel: Recent Labs  Lab 03/21/18 1447 03/22/18 0527 03/24/18 0527  NA 139 135 138  K 4.2 3.8 3.5  CL 103 103 105  CO2 26 23 25   GLUCOSE 168* 232* 138*  BUN 28* 27* 26*  CREATININE 1.56* 1.52* 1.26*  CALCIUM 9.5 8.8* 8.7*   GFR: Estimated Creatinine Clearance: 56.1 mL/min (A) (by C-G formula based on SCr of 1.26 mg/dL (H)). Liver Function Tests: Recent Labs  Lab 03/21/18 1447  AST 16  ALT 10  ALKPHOS 56  BILITOT 0.3  PROT 7.8  ALBUMIN 3.4*   No results for input(s): LIPASE, AMYLASE in the last 168 hours. No results for input(s): AMMONIA in the last 168 hours. Coagulation Profile: Recent Labs  Lab 03/21/18 1447  INR 1.1   Cardiac Enzymes: No results for input(s): CKTOTAL, CKMB, CKMBINDEX, TROPONINI in the last 168 hours. BNP (last 3 results) No results for input(s): PROBNP in the last 8760 hours. HbA1C: No results for input(s): HGBA1C in the last 72 hours. CBG: Recent Labs  Lab 03/22/18 2104 03/23/18 0759 03/23/18 1207 03/23/18 1636  03/24/18 0816  GLUCAP 142* 126* 176* 121* 132*   Lipid Profile: No results for input(s): CHOL, HDL, LDLCALC, TRIG, CHOLHDL, LDLDIRECT in the last 72 hours. Thyroid Function Tests: No results for input(s): TSH, T4TOTAL, FREET4, T3FREE, THYROIDAB in the last 72 hours. Anemia Panel: No results for input(s): VITAMINB12, FOLATE, FERRITIN, TIBC, IRON, RETICCTPCT in the last 72 hours. Sepsis Labs: Recent Labs  Lab 03/21/18 1447  LATICACIDVEN 1.9    Recent Results (from the past 240 hour(s))  Culture, blood (Routine x 2)     Status: Abnormal (Preliminary result)   Collection Time: 03/21/18  2:47 PM  Result Value Ref Range Status   Specimen Description   Final    RIGHT ANTECUBITAL Performed at [facility] [address], [place], [place] [address]    Special Requests   Final    BOTTLES DRAWN AEROBIC AND ANAEROBIC Blood Culture results may not be optimal due to an excessive volume of blood received in culture bottles Performed at [facility] [address], [place], [place] [address]    Culture  Setup Time   Final    GRAM NEGATIVE RODS IN BOTH AEROBIC AND ANAEROBIC BOTTLES CRITICAL RESULT CALLED TO, READ BACK BY AND VERIFIED WITH: Colin Rhein, PHARMD (  [facility]) AT 1105 ON 03/22/18 BY C. JESSUP, MLT.    Culture (A)  Final    KLEBSIELLA PNEUMONIAE HOLDING FOR POSSIBLE ANAEROBE Performed at [facility], [place] [address], [place], [place] [address]    Report Status PENDING  Incomplete   Organism ID, Bacteria KLEBSIELLA PNEUMONIAE  Final      Susceptibility   Klebsiella pneumoniae - MIC*    AMPICILLIN >=32 RESISTANT Resistant     CEFAZOLIN <=4 SENSITIVE Sensitive     CEFEPIME <=1 SENSITIVE Sensitive     CEFTAZIDIME <=1 SENSITIVE Sensitive     CEFTRIAXONE <=1 SENSITIVE Sensitive     CIPROFLOXACIN <=0.25 SENSITIVE Sensitive     GENTAMICIN <=1 SENSITIVE Sensitive     IMIPENEM <=0.25 SENSITIVE Sensitive     TRIMETH/SULFA <=20 SENSITIVE Sensitive      AMPICILLIN/SULBACTAM 16 INTERMEDIATE Intermediate     PIP/TAZO <=4 SENSITIVE Sensitive     Extended ESBL NEGATIVE Sensitive     * KLEBSIELLA PNEUMONIAE  Blood Culture ID Panel (Reflexed)     Status: Abnormal   Collection Time: 03/21/18  2:47 PM  Result Value Ref Range Status   Enterococcus species NOT DETECTED NOT DETECTED Final   Listeria monocytogenes NOT DETECTED NOT DETECTED Final   Staphylococcus species NOT DETECTED NOT DETECTED Final   Staphylococcus aureus (BCID) NOT DETECTED NOT DETECTED Final   Streptococcus species NOT DETECTED NOT DETECTED Final   Streptococcus agalactiae NOT DETECTED NOT DETECTED Final   Streptococcus pneumoniae NOT DETECTED NOT DETECTED Final   Streptococcus pyogenes NOT DETECTED NOT DETECTED Final   Acinetobacter baumannii NOT DETECTED NOT DETECTED Final   Enterobacteriaceae species DETECTED (A) NOT DETECTED Final    Comment: Enterobacteriaceae represent a large family of gram-negative bacteria, not a single organism. CRITICAL RESULT CALLED TO, READ BACK BY AND VERIFIED WITH: M. BELL, PHARMD ([facility]) AT 7858 ON 03/22/18 BY C. JESSUP, MLT.    Enterobacter cloacae complex NOT DETECTED NOT DETECTED Final   Escherichia coli NOT DETECTED NOT DETECTED Final   Klebsiella oxytoca NOT DETECTED NOT DETECTED Final   Klebsiella pneumoniae DETECTED (A) NOT DETECTED Final    Comment: CRITICAL RESULT CALLED TO, READ BACK BY AND VERIFIED WITH: M. BELL, PHARMD ([facility]) AT 1105 ON 03/22/18 BY C. JESSUP, MLT.    Proteus species NOT DETECTED NOT DETECTED Final   Serratia marcescens NOT DETECTED NOT DETECTED Final   Carbapenem resistance NOT DETECTED NOT DETECTED Final   Haemophilus influenzae NOT DETECTED NOT DETECTED Final   Neisseria meningitidis NOT DETECTED NOT DETECTED Final   Pseudomonas aeruginosa NOT DETECTED NOT DETECTED Final   Candida albicans NOT DETECTED NOT DETECTED Final   Candida glabrata NOT DETECTED NOT DETECTED Final   Candida krusei NOT DETECTED NOT DETECTED  Final   Candida parapsilosis NOT DETECTED NOT DETECTED Final   Candida tropicalis NOT DETECTED NOT DETECTED Final    Comment: Performed at [facility], [place] [address], [place], [place] [address]  Culture, blood (Routine x 2)     Status: Abnormal (Preliminary result)   Collection Time: 03/21/18  2:52 PM  Result Value Ref Range Status   Specimen Description   Final    BLOOD RIGHT FOREARM Performed at [facility] [address] [place], [place] [address]    Special Requests   Final    BOTTLES DRAWN AEROBIC AND ANAEROBIC Blood Culture adequate volume Performed at [facility] [address] [place] [address], [place], [place] [address]    Culture  Setup  Time   Final    GRAM NEGATIVE RODS IN BOTH AEROBIC AND ANAEROBIC BOTTLES CRITICAL VALUE NOTED.  VALUE IS CONSISTENT WITH PREVIOUSLY REPORTED AND CALLED VALUE.    Culture (A)  Final    KLEBSIELLA PNEUMONIAE SUSCEPTIBILITIES PERFORMED ON PREVIOUS CULTURE WITHIN THE LAST 5 DAYS. HOLDING FOR POSSIBLE ANAEROBE Performed at [facility], [place] [address], [place], [place] [address]    Report Status PENDING  Incomplete  Urine culture     Status: Abnormal (Preliminary result)   Collection Time: 03/21/18  3:28 PM  Result Value Ref Range Status   Specimen Description   Final    URINE, RANDOM Performed at [facility] [address], [place], [place] [address]    Special Requests   Final    NONE Performed at [facility], [place] [address], [place], [place] [address]    Culture (A)  Final    >=100,000 COLONIES/mL ALCALIGENES FAECALIS >=100,000 COLONIES/mL PROVIDENCIA RETTGERI    Report Status PENDING  Incomplete         Radiology Studies: No results found.      Scheduled Meds: . allopurinol  100 mg Oral Daily  . baclofen  20 mg Oral TID  . carbamazepine  200 mg Oral BID  . DULoxetine  20 mg Oral QHS  . fenofibrate  160 mg Oral Daily  . levothyroxine   25 mcg Oral Q0600  . rOPINIRole  0.25 mg Oral QHS  . rosuvastatin  40 mg Oral Daily  . sodium chloride flush  5 mL Intracatheter Q8H   Continuous Infusions: . sodium chloride 75 mL/hr at 03/25/18 0615  . cefTRIAXone (ROCEPHIN)  IV 2 g (03/24/18 1804)     LOS: 4 days    Time spent: [place], MD [facility]   If 7PM-7AM, please contact night-coverage [email or web address] Password [facility] 03/25/2018, 12:47 PM

## 2018-03-25 NOTE — Consult Note (Signed)
[place] [facility] Nurse wound consult note Patient receiving care in [place]. Spouse present Reason for Consult: care of vesicocutaneous fistula drainage.  Patient has been caring for the area (just above the mons pubis) for years with pads in his underwear.  Patient was not interested in using a pouching system for drainage containment.  He was receptive to trying a 4 x 4 foam dressing and this was provided. If he chooses to continue with the foam dressing, he is estimating he would need to change it twice daily. Thank you for the consult.  Discussed plan of care with the patient and bedside nurse.  [facility] nurse will not follow at this time.  Please re-consult the [facility] team if needed.  Val Riles, RN, MSN, CWOCN, CNS-BC, pager [phone]

## 2018-03-26 ENCOUNTER — Inpatient Hospital Stay (HOSPITAL_COMMUNITY): Payer: Medicare Other

## 2018-03-26 LAB — URINE CULTURE

## 2018-03-26 LAB — BASIC METABOLIC PANEL
Anion gap: 10 (ref 5–15)
BUN: 24 mg/dL — ABNORMAL HIGH (ref 6–20)
CO2: 25 mmol/L (ref 22–32)
Calcium: 8.8 mg/dL — ABNORMAL LOW (ref 8.9–10.3)
Chloride: 102 mmol/L (ref 98–111)
Creatinine, Ser: 1.1 mg/dL (ref 0.61–1.24)
GFR calc Af Amer: >60 mL/min (ref >60)
GFR calc non Af Amer: >60 mL/min (ref >60)
Glucose, Bld: 174 mg/dL — ABNORMAL HIGH (ref 70–99)
Potassium: 3.7 mmol/L (ref 3.5–5.1)

## 2018-03-26 LAB — CBC
HCT: 26.5 % — ABNORMAL LOW (ref 39.0–52.0)
Hemoglobin: 8.1 g/dL — ABNORMAL LOW (ref 13.0–17.0)
MCH: 28.5 pg (ref 26.0–34.0)
MCHC: 30.6 g/dL (ref 30.0–36.0)
MCV: 93.3 fL (ref 80.0–100.0)
Platelets: 178 10*3/uL (ref 150–400)
RBC: 2.84 MIL/uL — ABNORMAL LOW (ref 4.22–5.81)
RDW: 18.6 % — ABNORMAL HIGH (ref 11.5–15.5)
WBC: 3.8 10*3/uL — ABNORMAL LOW (ref 4.0–10.5)
nRBC: 0 % (ref 0.0–0.2)

## 2018-03-26 LAB — MAGNESIUM: Magnesium: 1.8 mg/dL (ref 1.7–2.4)

## 2018-03-26 LAB — BASIC METABOLIC PANEL WITH GFR: Sodium: 137 mmol/L (ref 135–145)

## 2018-03-26 MED ORDER — MORPHINE SULFATE (PF) 2 MG/ML IV SOLN
2.0000 mg | INTRAVENOUS | Status: DC | PRN
  Administered 2018-03-26 – 2018-03-28 (×11): 2 mg via INTRAVENOUS
  Filled 2018-03-26 (×11): qty 1

## 2018-03-26 NOTE — Progress Notes (Signed)
PROGRESS NOTE    Andrew Joyce  JJK:[id number] DOB: 1960-10-29 DOA: 03/21/2018 PCP: [name], NP   Brief Narrative:  58 year old with past medical history relevant for history of bladder exstrophy status post surgery, urothelial renal cell carcinoma complicated by urinary bladder abdominal wall fistula, status post bilateral PCN tubes complicated by recurrent obstruction, nephrolithiasis, coronary artery disease status post CABG, hypothyroidism, gout, depression/anxiety, chronic pain who was admitted for sepsis and found to have Klebsiella bacteremia from a urinary source likely due to PCN tube malfunction as well as post renal AKI.   Assessment & Plan:   Principal Problem:   Pyelonephritis Active Problems:   CKD (chronic kidney disease) stage 3, GFR 30-59 ml/min (HCC)   Hydronephrosis   Sepsis ([place])   Anemia   Type 2 diabetes mellitus ([place])   #) Sepsis due to Klebsiella bacteremia from urinary source: Sepsis is resolved with IV fluids, IV antibiotics - Continue IV ceftriaxone started 03/24/2018 - From 03/21/2018 growing a Klebsiella pneumonia sensitive to ceftriaxone  #) History of bladder exstrophy/urothelial cell cancer status post PCN tubes: Patient has had multiple problems with recurrent malfunctions of PCN tubes requiring exchange -Most recently exchanged on 03/22/2018 by interventional radiology  #) Bladder/abdominal wall fistula: This was thought to be inflammatory and likely secondary to possible cancer.  Patient reports pain and increasing redness and induration is area that extends down to groin. -We will obtain ultrasound soft tissue to look for fluid collection -IV antibiotics per above -Urology notified, patient is followed by urology at [facility]  #) Post renal AKI: Resolved after exchanging PCN tubes and IV fluids  #) Type 2 diabetes: -Hold sitagliptin -Sliding scale insulin, AC at bedtime  #) Acute on chronic anemia: This is been a longstanding  issue for the patient.  Suspect a combination of possible CKD as well as underlying anemia of chronic disease. -Status post 1 packed red blood cell on 03/22/2018  #) Hypertension/hyperlipidemia/coronary artery disease status post CABG: -Hold isosorbide mononitrate -Hold losartan 100 mg daily - Continue rosuvastatin 40 mg daily - Hold amlodipine 5 mg daily -Continue fenofibrate 160 mg daily -Hold metoprolol tartrate 25 mg twice daily  #) Gout: -Continue allopurinol 100 mg daily  #) Chronic pain/psych: - Continue gabapentin 300 mg nightly - Continue 20 mg baclofen 3 times daily -Continue carbamazepine 200 mg twice daily -Continue duloxetine 20 mg nightly -Continue fentanyl patch  #) Hypothyroidism: -Continue levothyroxine 25 mcg  Fluids: Tolerating p.o. Electrolytes: Monitor and supplement Nutrition: Heart healthy carb restricted diet  Prophylaxis: Subcu heparin   Disposition: Pending ultrasound of abdomen near fistula site  Full code   Consultants:   Interventional radiology  Urology  Procedures:   Bilateral PCN tube exchange 03/22/2018  Antimicrobials:   IV ceftriaxone 03/23/2020 ongoing  IV cefepime 03/21/2020 to 03/24/2018   Subjective: This morning the patient continued to report pain at the site of his suprapubic fistula.  He reports this pain is new and getting worse.  He reports it radiates down to his groin. Objective: Vitals:   03/25/18 0456 03/25/18 1350 03/25/18 2148 03/26/18 0511  BP: 129/81 123/79 139/77 (!) 148/88  Pulse: (!) 59 67 (!) 53 66  Resp: 18 17 18 18   Temp: 98.7 F (37.1 C) 98.1 F (36.7 C) 98.3 F (36.8 C) 98.3 F (36.8 C)  TempSrc: Oral Oral Oral Oral  SpO2: 100% 100% 100% 100%  Weight:      Height:        Intake/Output Summary (Last 24  hours) at 03/26/2018 0913 Last data filed at 03/26/2018 0523 Gross per 24 hour  Intake 1875.64 ml  Output 2945 ml  Net -1069.36 ml   Filed Weights   03/21/18 2158  Weight: 62.8 kg     Examination:  General exam: Appears calm and comfortable  Respiratory system: Clear to auscultation. Respiratory effort normal. Cardiovascular system: Regular rate and rhythm, no murmurs Gastrointestinal system: Soft, nondistended, no rebound or guarding, plus bowel sounds. Central nervous system: Alert and oriented.  Grossly intact, moving all extremities Extremities: No lower extremity edema Skin: Well-healed abdominal incisions, suprapubic fistula site with some drainage and erythema surrounding it as well as some macerations, some induration but no clear fluid collection noted, PCN tube sites are clean dry and intact Psychiatry: Judgement and insight appear normal. Mood & affect appropriate.     Data Reviewed: I have personally reviewed following labs and imaging studies  CBC: Recent Labs  Lab 03/21/18 1447 03/22/18 0527 03/22/18 1831 03/24/18 0527 03/26/18 0455  WBC 6.6 8.9 8.5 4.7 3.8*  NEUTROABS 5.7  --   --   --   --   HGB 7.5* 6.8* 8.0* 7.6* 8.1*  HCT 25.6* 22.7* 26.7* 25.3* 26.5*  MCV 98.1 97.4 94.7 95.1 93.3  PLT 219 180 212 161 440   Basic Metabolic Panel: Recent Labs  Lab 03/21/18 1447 03/22/18 0527 03/24/18 0527 03/26/18 0455  NA 139 135 138 137  K 4.2 3.8 3.5 3.7  CL 103 103 105 102  CO2 26 23 25 25   GLUCOSE 168* 232* 138* 174*  BUN 28* 27* 26* 24*  CREATININE 1.56* 1.52* 1.26* 1.10  CALCIUM 9.5 8.8* 8.7* 8.8*  MG  --   --   --  1.8   GFR: Estimated Creatinine Clearance: 64.2 mL/min (by C-G formula based on SCr of 1.1 mg/dL). Liver Function Tests: Recent Labs  Lab 03/21/18 1447  AST 16  ALT 10  ALKPHOS 56  BILITOT 0.3  PROT 7.8  ALBUMIN 3.4*   No results for input(s): LIPASE, AMYLASE in the last 168 hours. No results for input(s): AMMONIA in the last 168 hours. Coagulation Profile: Recent Labs  Lab 03/21/18 1447  INR 1.1   Cardiac Enzymes: No results for input(s): CKTOTAL, CKMB, CKMBINDEX, TROPONINI in the last 168 hours. BNP (last  3 results) No results for input(s): PROBNP in the last 8760 hours. HbA1C: No results for input(s): HGBA1C in the last 72 hours. CBG: Recent Labs  Lab 03/22/18 2104 03/23/18 0759 03/23/18 1207 03/23/18 1636 03/24/18 0816  GLUCAP 142* 126* 176* 121* 132*   Lipid Profile: No results for input(s): CHOL, HDL, LDLCALC, TRIG, CHOLHDL, LDLDIRECT in the last 72 hours. Thyroid Function Tests: No results for input(s): TSH, T4TOTAL, FREET4, T3FREE, THYROIDAB in the last 72 hours. Anemia Panel: No results for input(s): VITAMINB12, FOLATE, FERRITIN, TIBC, IRON, RETICCTPCT in the last 72 hours. Sepsis Labs: Recent Labs  Lab 03/21/18 1447  LATICACIDVEN 1.9    Recent Results (from the past 240 hour(s))  Culture, blood (Routine x 2)     Status: Abnormal   Collection Time: 03/21/18  2:47 PM  Result Value Ref Range Status   Specimen Description   Final    RIGHT ANTECUBITAL Performed at [facility] [address] [place], [place] [address]    Special Requests   Final    BOTTLES DRAWN AEROBIC AND ANAEROBIC Blood Culture results may not be optimal due to an excessive volume of blood received in culture bottles  Performed at [facility], [place] [address] [place], [place] [address]    Culture  Setup Time   Final    GRAM NEGATIVE RODS IN BOTH AEROBIC AND ANAEROBIC BOTTLES CRITICAL RESULT CALLED TO, READ BACK BY AND VERIFIED WITH: M. BELL, PHARMD ([place]) AT 0102 ON 03/22/18 BY C. JESSUP, MLT. Performed at [facility], [place] [address] [place], [place] [address]    Culture KLEBSIELLA PNEUMONIAE (A)  Final   Report Status 03/25/2018 FINAL  Final   Organism ID, Bacteria KLEBSIELLA PNEUMONIAE  Final      Susceptibility   Klebsiella pneumoniae - MIC*    AMPICILLIN >=32 RESISTANT Resistant     CEFAZOLIN <=4 SENSITIVE Sensitive     CEFEPIME <=1 SENSITIVE Sensitive     CEFTAZIDIME <=1 SENSITIVE Sensitive     CEFTRIAXONE <=1 SENSITIVE Sensitive      CIPROFLOXACIN <=0.25 SENSITIVE Sensitive     GENTAMICIN <=1 SENSITIVE Sensitive     IMIPENEM <=0.25 SENSITIVE Sensitive     TRIMETH/SULFA <=20 SENSITIVE Sensitive     AMPICILLIN/SULBACTAM 16 INTERMEDIATE Intermediate     PIP/TAZO <=4 SENSITIVE Sensitive     Extended ESBL NEGATIVE Sensitive     * KLEBSIELLA PNEUMONIAE  Blood Culture ID Panel (Reflexed)     Status: Abnormal   Collection Time: 03/21/18  2:47 PM  Result Value Ref Range Status   Enterococcus species NOT DETECTED NOT DETECTED Final   Listeria monocytogenes NOT DETECTED NOT DETECTED Final   Staphylococcus species NOT DETECTED NOT DETECTED Final   Staphylococcus aureus (BCID) NOT DETECTED NOT DETECTED Final   Streptococcus species NOT DETECTED NOT DETECTED Final   Streptococcus agalactiae NOT DETECTED NOT DETECTED Final   Streptococcus pneumoniae NOT DETECTED NOT DETECTED Final   Streptococcus pyogenes NOT DETECTED NOT DETECTED Final   Acinetobacter baumannii NOT DETECTED NOT DETECTED Final   Enterobacteriaceae species DETECTED (A) NOT DETECTED Final    Comment: Enterobacteriaceae represent a large family of gram-negative bacteria, not a single organism. CRITICAL RESULT CALLED TO, READ BACK BY AND VERIFIED WITH: M. BELL, PHARMD ([place]) AT 6440 ON 03/22/18 BY C. JESSUP, MLT.    Enterobacter cloacae complex NOT DETECTED NOT DETECTED Final   Escherichia coli NOT DETECTED NOT DETECTED Final   Klebsiella oxytoca NOT DETECTED NOT DETECTED Final   Klebsiella pneumoniae DETECTED (A) NOT DETECTED Final    Comment: CRITICAL RESULT CALLED TO, READ BACK BY AND VERIFIED WITH: M. BELL, PHARMD ([place]) AT 1105 ON 03/22/18 BY C. JESSUP, MLT.    Proteus species NOT DETECTED NOT DETECTED Final   Serratia marcescens NOT DETECTED NOT DETECTED Final   Carbapenem resistance NOT DETECTED NOT DETECTED Final   Haemophilus influenzae NOT DETECTED NOT DETECTED Final   Neisseria meningitidis NOT DETECTED NOT DETECTED Final   Pseudomonas aeruginosa NOT  DETECTED NOT DETECTED Final   Candida albicans NOT DETECTED NOT DETECTED Final   Candida glabrata NOT DETECTED NOT DETECTED Final   Candida krusei NOT DETECTED NOT DETECTED Final   Candida parapsilosis NOT DETECTED NOT DETECTED Final   Candida tropicalis NOT DETECTED NOT DETECTED Final    Comment: Performed at [facility], [place] [address] [place], [place] [address]  Culture, blood (Routine x 2)     Status: Abnormal   Collection Time: 03/21/18  2:52 PM  Result Value Ref Range Status   Specimen Description   Final    BLOOD RIGHT FOREARM Performed at [facility] [address], [place], [place] [address]  Special Requests   Final    BOTTLES DRAWN AEROBIC AND ANAEROBIC Blood Culture adequate volume Performed at [facility] [address] [place], [place] [address]    Culture  Setup Time   Final    GRAM NEGATIVE RODS IN BOTH AEROBIC AND ANAEROBIC BOTTLES CRITICAL VALUE NOTED.  VALUE IS CONSISTENT WITH PREVIOUSLY REPORTED AND CALLED VALUE.    Culture (A)  Final    KLEBSIELLA PNEUMONIAE SUSCEPTIBILITIES PERFORMED ON PREVIOUS CULTURE WITHIN THE LAST 5 DAYS. Performed at [facility], [place] [address], [place], [place] [address]    Report Status 03/25/2018 FINAL  Final  Urine culture     Status: Abnormal   Collection Time: 03/21/18  3:28 PM  Result Value Ref Range Status   Specimen Description   Final    URINE, RANDOM Performed at [facility] [address], [place], [place] [address]    Special Requests   Final    NONE Performed at [facility], [place] [address] [place], [place] [address]    Culture (A)  Final    >=100,000 COLONIES/mL ALCALIGENES FAECALIS >=100,000 COLONIES/mL PROVIDENCIA RETTGERI    Report Status 03/26/2018 FINAL  Final   Organism ID, Bacteria ALCALIGENES FAECALIS (A)  Final   Organism ID, Bacteria PROVIDENCIA RETTGERI (A)  Final      Susceptibility   Alcaligenes  faecalis - MIC*    CEFEPIME 4 SENSITIVE Sensitive     CEFAZOLIN 16 SENSITIVE Sensitive     GENTAMICIN <=1 SENSITIVE Sensitive     CIPROFLOXACIN 1 SENSITIVE Sensitive     IMIPENEM 0.5 SENSITIVE Sensitive     TRIMETH/SULFA <=20 SENSITIVE Sensitive     * >=100,000 COLONIES/mL ALCALIGENES FAECALIS   Providencia rettgeri - MIC*    AMPICILLIN >=32 RESISTANT Resistant     CEFAZOLIN >=64 RESISTANT Resistant     CEFTRIAXONE <=1 SENSITIVE Sensitive     CIPROFLOXACIN <=0.25 SENSITIVE Sensitive     GENTAMICIN <=1 SENSITIVE Sensitive     IMIPENEM 2 SENSITIVE Sensitive     NITROFURANTOIN 256 RESISTANT Resistant     TRIMETH/SULFA <=20 SENSITIVE Sensitive     AMPICILLIN/SULBACTAM <=2 SENSITIVE Sensitive     PIP/TAZO <=4 SENSITIVE Sensitive     * >=100,000 COLONIES/mL PROVIDENCIA RETTGERI         Radiology Studies: US Pelvis Limited (transabdominal Only)  Result Date: 03/26/2018 CLINICAL DATA:  Pubic pain. EXAM: LIMITED ULTRASOUND OF PELVIS TECHNIQUE: Limited transabdominal ultrasound examination of the pelvis was performed. COMPARISON:  03/21/2018. FINDINGS: Ultrasound in the region of clinical concern superior to the mons pubis reveals a 2.2 x 1.9 x 1.9 cm thickly septated cystic structure. This complex fluid collection could represent a abscess. Cystic tumor would be much less likely. IMPRESSION: 2.2 x 1.9 x 1.9 cm thickening septated cystic structure in the region of clinical concern. An abscess could present in this fashion. Electronically Signed   By: Marcello Moores  Register   On: 03/26/2018 09:00        Scheduled Meds: . allopurinol  100 mg Oral Daily  . baclofen  20 mg Oral TID  . carbamazepine  200 mg Oral BID  . DULoxetine  20 mg Oral QHS  . fenofibrate  160 mg Oral Daily  . fentaNYL  1 patch Transdermal Q72H  . heparin injection (subcutaneous)  5,000 Units Subcutaneous Q8H  . levothyroxine  25 mcg Oral Q0600  . rOPINIRole  0.25 mg Oral QHS  . rosuvastatin  40 mg Oral  Daily  . sodium  chloride flush  5 mL Intracatheter Q8H   Continuous Infusions: . cefTRIAXone (ROCEPHIN)  IV 2 g (03/25/18 1732)     LOS: 5 days    Time spent: [place], MD [facility]   If 7PM-7AM, please contact night-coverage [email or web address] Password [facility] 03/26/2018, 9:13 AM

## 2018-03-26 NOTE — Progress Notes (Signed)
Patient requesting that pain medication be increased.  Asking of it can be changed from 1 mg to 2 mg to help better control his pain.  Reports his pain stays 4/10 and he is unable to rest.  Dr. Herbert Moors notified via text page.

## 2018-03-27 ENCOUNTER — Inpatient Hospital Stay (HOSPITAL_COMMUNITY): Payer: Medicare Other

## 2018-03-27 LAB — BASIC METABOLIC PANEL
Anion gap: 9 (ref 5–15)
BUN: 21 mg/dL — ABNORMAL HIGH (ref 6–20)
CO2: 27 mmol/L (ref 22–32)
Calcium: 9.1 mg/dL (ref 8.9–10.3)
Creatinine, Ser: 1.09 mg/dL (ref 0.61–1.24)
GFR calc Af Amer: >60 mL/min (ref >60)
GFR calc non Af Amer: >60 mL/min (ref >60)
Glucose, Bld: 131 mg/dL — ABNORMAL HIGH (ref 70–99)
Potassium: 3.9 mmol/L (ref 3.5–5.1)
Sodium: 138 mmol/L (ref 135–145)

## 2018-03-27 LAB — BASIC METABOLIC PANEL WITH GFR: Chloride: 102 mmol/L (ref 98–111)

## 2018-03-27 LAB — CBC
HCT: 27.7 % — ABNORMAL LOW (ref 39.0–52.0)
Hemoglobin: 8.3 g/dL — ABNORMAL LOW (ref 13.0–17.0)
MCH: 28.5 pg (ref 26.0–34.0)
MCHC: 30 g/dL (ref 30.0–36.0)
MCV: 95.2 fL (ref 80.0–100.0)
Platelets: 225 10*3/uL (ref 150–400)
RBC: 2.91 MIL/uL — ABNORMAL LOW (ref 4.22–5.81)
RDW: 18.6 % — ABNORMAL HIGH (ref 11.5–15.5)
WBC: 4.9 10*3/uL (ref 4.0–10.5)
nRBC: 0 % (ref 0.0–0.2)

## 2018-03-27 MED ORDER — GADOBUTROL 1 MMOL/ML IV SOLN
7.0000 mL | Freq: Once | INTRAVENOUS | Status: AC | PRN
  Administered 2018-03-27: 7 mL via INTRAVENOUS

## 2018-03-27 NOTE — Consult Note (Signed)
Urology Consult  Referring physician: Dr. 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 He has a history of vesicocutaneous fistula. His nephrostomy tubes were exchanged. Urine growing klebsiella. He developed worsening suprpaubic pain and discomfort yesterday. No current fevers. The pain is dull, constant, severe, and nonraditing. No exacerbating/alleviaiting events. No other associated symptoms. THe fistula tract has been drainign purulent material for 3 days. Abdominal US shows cystic fluid collection likely the urinary bladder.   Past Medical History:  Diagnosis Date  . 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 Chronic kidney disease (CKD), stage II (mild)   . Congenital birth defect   . Coronary artery disease    a. s/p CABG in 2008; b.  s/p cath in 2011 for med rx.;  c.  NSTEMI (02/2011):  LHC (02/2011):  LAD occluded, distal LAD filled via left to left collaterals, distal CFX 40-50%, proximal RCA occluded (right to right collaterals), distal RCA occluded, S-RCA occluded proximally, S-OM1/OM2 occluded (new from 2011 - culprit), L-LAD ok, ant apical HK, EF 45-50% - med Rx.  [name] Dyslipidemia   . History of renal stone   . Hx of cardiovascular stress test    Nuclear (06/2009):  EF 71%, small area of infarct/ischemia in inferior wall-unchanged from prior  . Hx of echocardiogram    Echo (05/2013):  EF 55-60%, inf HK, mild AI, normal RVSF, RVSP 28 mmHg  . Hypertension   . Pancreatitis   . Recurrent UTI    Past Surgical History:  Procedure Laterality Date  . CARDIAC CATHETERIZATION  07/28/2009   EF 60%; Grafts patent except for SVG to the AM and PD. Native RCA is occluded.  [name] CARDIAC CATHETERIZATION   03/20/2006   EF 60-65%  . CARDIOVASCULAR STRESS TEST  06/12/2009   EF 71%, SMALL AREA OF INFARCT/ISCHEMIA IN INFERIOR WALL; FELT TO NOT BE CHANGED.  [name] CORONARY ARTERY BYPASS GRAFT  03/2006   LIMA GRAFT TO LAD, SAPHENOUS VEIN GRAFT TO THE ACUTE MARGINAL BRANCH THE RIGHT CORONARY, SAPHENOUS VEIN GRAFT TO THE PDA, SEQUENTIAL VEIN GRAFT TO THE FIRST AND SECOND OBTUSE MARGINAL VESSELS  . IR IMAGING GUIDED PORT INSERTION  09/19/2017  . IR NEPHROSTOMY EXCHANGE LEFT  03/22/2018  . IR NEPHROSTOMY EXCHANGE RIGHT  03/22/2018  . KIDNEY SURGERY    . LEFT HEART CATHETERIZATION WITH CORONARY ANGIOGRAM N/A 02/26/2011   Procedure: LEFT HEART CATHETERIZATION WITH CORONARY ANGIOGRAM;  Surgeon: Thayer Headings, MD;  Location: [facility] CATH LAB;  Service: Cardiovascular;  Laterality: N/A;    Medications: I have reviewed the patient's current medications. Allergies: No Known Allergies  Family History  Problem Relation Age of Onset  . Hyperlipidemia Mother        Living, 46  . Hypertension Mother   . Hyperlipidemia Father        Living 95  . Hypertension Father   . Healthy Sister   . Healthy Brother    Social History:  reports that he has never smoked. He has never used smokeless tobacco. He reports that he does not drink alcohol or use drugs.  Review of Systems  Gastrointestinal: Positive for abdominal pain.  All other systems reviewed and are negative.   Physical Exam:  Vital signs in  last 24 hours: Temp:  [98.3 F (36.8 C)-98.6 F (37 C)] 98.3 F (36.8 C) (03/20 0531) Pulse Rate:  [57-64] 64 (03/20 0531) Resp:  [11-24] 11 (03/20 0531) BP: (111-130)/(75-82) 127/82 (03/20 0531) SpO2:  [99 %-100 %] 99 % (03/20 0531) Physical Exam  Constitutional: He is oriented to person, place, and time. He appears well-developed and well-nourished. No distress.  HENT:  Head: Normocephalic and atraumatic.  Eyes: Pupils are equal, round, and reactive to light. EOM are normal.  Neck: Normal range of motion. No  thyromegaly present.  Cardiovascular: Normal rate and regular rhythm.  Respiratory: Effort normal. No respiratory distress.  GI: There is abdominal tenderness.  8cm suprapubic wound with multiple fistual tracts draining purulent material. Severe pubic bone tenderness.   Genitourinary:    Testes normal.     Genitourinary Comments: Small phallus with no urethral meatus.   Musculoskeletal: Normal range of motion.        General: No edema.  Neurological: He is alert and oriented to person, place, and time.  Skin: Skin is warm and dry.  Psychiatric: He has a normal mood and affect. His behavior is normal. Judgment and thought content normal.    Laboratory Data:  Results for orders placed or performed during the hospital encounter of 03/21/18 (from the past 72 hour(s))  Glucose, capillary     Status: Abnormal   Collection Time: 03/24/18  8:16 AM  Result Value Ref Range   Glucose-Capillary 132 (H) 70 - 99 mg/dL  CBC     Status: Abnormal   Collection Time: 03/26/18  4:55 AM  Result Value Ref Range   WBC 3.8 (L) 4.0 - 10.5 K/uL   RBC 2.84 (L) 4.22 - 5.81 MIL/uL   Hemoglobin 8.1 (L) 13.0 - 17.0 g/dL   HCT 26.5 (L) 39.0 - 52.0 %   MCV 93.3 80.0 - 100.0 fL   MCH 28.5 26.0 - 34.0 pg   MCHC 30.6 30.0 - 36.0 g/dL   RDW 18.6 (H) 11.5 - 15.5 %   Platelets 178 150 - 400 K/uL   nRBC 0.0 0.0 - 0.2 %    Comment: Performed at [facility], [place] [address], [place], [place] [address]  Basic metabolic panel     Status: Abnormal   Collection Time: 03/26/18  4:55 AM  Result Value Ref Range   Sodium 137 135 - 145 mmol/L   Potassium 3.7 3.5 - 5.1 mmol/L   Chloride 102 98 - 111 mmol/L   CO2 25 22 - 32 mmol/L   Glucose, Bld 174 (H) 70 - 99 mg/dL   BUN 24 (H) 6 - 20 mg/dL   Creatinine, Ser 1.10 0.61 - 1.24 mg/dL   Calcium 8.8 (L) 8.9 - 10.3 mg/dL   GFR calc non Af Amer >60 >60 mL/min   GFR calc Af Amer >60 >60 mL/min   Anion gap 10 5 - 15    Comment: Performed at [facility], [place] [address], [place], [place] [address]  Magnesium     Status: None   Collection Time: 03/26/18  4:55 AM  Result Value Ref Range   Magnesium 1.8 1.7 - 2.4 mg/dL    Comment: Performed at [facility], [place] [address], [place], [place] [address]  CBC     Status: Abnormal   Collection Time: 03/27/18  5:08 AM  Result Value Ref Range   WBC 4.9 4.0 - 10.5 K/uL   RBC 2.91 (L) 4.22 - 5.81  MIL/uL   Hemoglobin 8.3 (L) 13.0 - 17.0 g/dL   HCT 27.7 (L) 39.0 - 52.0 %   MCV 95.2 80.0 - 100.0 fL   MCH 28.5 26.0 - 34.0 pg   MCHC 30.0 30.0 - 36.0 g/dL   RDW 18.6 (H) 11.5 - 15.5 %   Platelets 225 150 - 400 K/uL   nRBC 0.0 0.0 - 0.2 %    Comment: Performed at [facility], [place] [address], [place], [place] [address]  Basic metabolic panel     Status: Abnormal   Collection Time: 03/27/18  5:08 AM  Result Value Ref Range   Sodium 138 135 - 145 mmol/L   Potassium 3.9 3.5 - 5.1 mmol/L   Chloride 102 98 - 111 mmol/L   CO2 27 22 - 32 mmol/L   Glucose, Bld 131 (H) 70 - 99 mg/dL   BUN 21 (H) 6 - 20 mg/dL   Creatinine, Ser 1.09 0.61 - 1.24 mg/dL   Calcium 9.1 8.9 - 10.3 mg/dL   GFR calc non Af Amer >60 >60 mL/min   GFR calc Af Amer >60 >60 mL/min   Anion gap 9 5 - 15    Comment: Performed at [facility], [place] [address], [place], [place] [address]   Recent Results (from the past 240 hour(s))  Culture, blood (Routine x 2)     Status: Abnormal   Collection Time: 03/21/18  2:47 PM  Result Value Ref Range Status   Specimen Description   Final    RIGHT ANTECUBITAL Performed at [facility] [address], [place], [place] [address]    Special Requests   Final    BOTTLES DRAWN AEROBIC AND ANAEROBIC Blood Culture results may not be optimal due to an excessive volume of blood received in culture bottles Performed at [facility] [address], [place], [place] [address]    Culture  Setup  Time   Final    GRAM NEGATIVE RODS IN BOTH AEROBIC AND ANAEROBIC BOTTLES CRITICAL RESULT CALLED TO, READ BACK BY AND VERIFIED WITH: M. BELL, PHARMD (WL) AT 3532 ON 03/22/18 BY C. JESSUP, MLT. Performed at [facility], [place] [address], [place],  [address]    Culture KLEBSIELLA PNEUMONIAE (A)  Final   Report Status 03/25/2018 FINAL  Final   Organism ID, Bacteria KLEBSIELLA PNEUMONIAE  Final      Susceptibility   Klebsiella pneumoniae - MIC*    AMPICILLIN >=32 RESISTANT Resistant     CEFAZOLIN <=4 SENSITIVE Sensitive     CEFEPIME <=1 SENSITIVE Sensitive     CEFTAZIDIME <=1 SENSITIVE Sensitive     CEFTRIAXONE <=1 SENSITIVE Sensitive     CIPROFLOXACIN <=0.25 SENSITIVE Sensitive     GENTAMICIN <=1 SENSITIVE Sensitive     IMIPENEM <=0.25 SENSITIVE Sensitive     TRIMETH/SULFA <=20 SENSITIVE Sensitive     AMPICILLIN/SULBACTAM 16 INTERMEDIATE Intermediate     PIP/TAZO <=4 SENSITIVE Sensitive     Extended ESBL NEGATIVE Sensitive     * KLEBSIELLA PNEUMONIAE  Blood Culture ID Panel (Reflexed)     Status: Abnormal   Collection Time: 03/21/18  2:47 PM  Result Value Ref Range Status   Enterococcus species NOT DETECTED NOT DETECTED Final   Listeria monocytogenes NOT DETECTED NOT DETECTED Final   Staphylococcus species NOT DETECTED NOT DETECTED Final   Staphylococcus aureus (BCID) NOT DETECTED NOT DETECTED Final   Streptococcus species NOT DETECTED NOT DETECTED Final   Streptococcus  agalactiae NOT DETECTED NOT DETECTED Final   Streptococcus pneumoniae NOT DETECTED NOT DETECTED Final   Streptococcus pyogenes NOT DETECTED NOT DETECTED Final   Acinetobacter baumannii NOT DETECTED NOT DETECTED Final   Enterobacteriaceae species DETECTED (A) NOT DETECTED Final    Comment: Enterobacteriaceae represent a large family of gram-negative bacteria, not a single organism. CRITICAL RESULT CALLED TO, READ BACK BY AND VERIFIED WITH: M. BELL, PHARMD (WL) AT 7939 ON 03/22/18 BY C. JESSUP, MLT.     Enterobacter cloacae complex NOT DETECTED NOT DETECTED Final   Escherichia coli NOT DETECTED NOT DETECTED Final   Klebsiella oxytoca NOT DETECTED NOT DETECTED Final   Klebsiella pneumoniae DETECTED (A) NOT DETECTED Final    Comment: CRITICAL RESULT CALLED TO, READ BACK BY AND VERIFIED WITH: M. BELL, PHARMD (WL) AT 1105 ON 03/22/18 BY C. JESSUP, MLT.    Proteus species NOT DETECTED NOT DETECTED Final   Serratia marcescens NOT DETECTED NOT DETECTED Final   Carbapenem resistance NOT DETECTED NOT DETECTED Final   Haemophilus influenzae NOT DETECTED NOT DETECTED Final   Neisseria meningitidis NOT DETECTED NOT DETECTED Final   Pseudomonas aeruginosa NOT DETECTED NOT DETECTED Final   Candida albicans NOT DETECTED NOT DETECTED Final   Candida glabrata NOT DETECTED NOT DETECTED Final   Candida krusei NOT DETECTED NOT DETECTED Final   Candida parapsilosis NOT DETECTED NOT DETECTED Final   Candida tropicalis NOT DETECTED NOT DETECTED Final    Comment: Performed at [facility], [place] [address], [place], [place] [address]  Culture, blood (Routine x 2)     Status: Abnormal   Collection Time: 03/21/18  2:52 PM  Result Value Ref Range Status   Specimen Description   Final    BLOOD RIGHT FOREARM Performed at [facility] [address] [place], [place] [address]    Special Requests   Final    BOTTLES DRAWN AEROBIC AND ANAEROBIC Blood Culture adequate volume Performed at [facility] [address], [place], [place] [address]    Culture  Setup Time   Final    GRAM NEGATIVE RODS IN BOTH AEROBIC AND ANAEROBIC BOTTLES CRITICAL VALUE NOTED.  VALUE IS CONSISTENT WITH PREVIOUSLY REPORTED AND CALLED VALUE.    Culture (A)  Final    KLEBSIELLA PNEUMONIAE SUSCEPTIBILITIES PERFORMED ON PREVIOUS CULTURE WITHIN THE LAST 5 DAYS. Performed at [facility], [place] [address], [place], [place] [address]    Report Status 03/25/2018 FINAL  Final  Urine culture      Status: Abnormal   Collection Time: 03/21/18  3:28 PM  Result Value Ref Range Status   Specimen Description   Final    URINE, RANDOM Performed at [facility] [address], [place], [place] [address]    Special Requests   Final    NONE Performed at [facility], [place] [address], [place], [place] [address]    Culture (A)  Final    >=100,000 COLONIES/mL ALCALIGENES FAECALIS >=100,000 COLONIES/mL PROVIDENCIA RETTGERI    Report Status 03/26/2018 FINAL  Final   Organism ID, Bacteria ALCALIGENES FAECALIS (A)  Final   Organism ID, Bacteria PROVIDENCIA RETTGERI (A)  Final      Susceptibility   Alcaligenes faecalis - MIC*    CEFEPIME 4 SENSITIVE Sensitive     CEFAZOLIN 16 SENSITIVE Sensitive     GENTAMICIN <=1 SENSITIVE Sensitive     CIPROFLOXACIN 1 SENSITIVE Sensitive     IMIPENEM 0.5 SENSITIVE Sensitive  TRIMETH/SULFA <=20 SENSITIVE Sensitive     * >=100,000 COLONIES/mL ALCALIGENES FAECALIS   Providencia rettgeri - MIC*    AMPICILLIN >=32 RESISTANT Resistant     CEFAZOLIN >=64 RESISTANT Resistant     CEFTRIAXONE <=1 SENSITIVE Sensitive     CIPROFLOXACIN <=0.25 SENSITIVE Sensitive     GENTAMICIN <=1 SENSITIVE Sensitive     IMIPENEM 2 SENSITIVE Sensitive     NITROFURANTOIN 256 RESISTANT Resistant     TRIMETH/SULFA <=20 SENSITIVE Sensitive     AMPICILLIN/SULBACTAM <=2 SENSITIVE Sensitive     PIP/TAZO <=4 SENSITIVE Sensitive     * >=100,000 COLONIES/mL PROVIDENCIA RETTGERI   Creatinine: Recent Labs    03/21/18 1447 03/22/18 0527 03/24/18 0527 03/26/18 0455 03/27/18 0508  CREATININE 1.56* 1.52* 1.26* 1.10 1.09   Baseline Creatinine: 1.2  Impression/Assessment:  57yo with vesicocutaneous fistula and concern for necrosis of the abdominal wall surround fistula tract and surrounding structures including possible osteomylitis.    Plan:  I discussed the management of this complex wound/fistula with the patient and his wife. Due to the  complexity of the patients wound and the concern for underlying necrosis of his abdominal wall, bladder and the concern for osteomyelitis of his pubic bone this patient will benefit from transfer to a tertiary care facility,.   Nicolette Bang 03/27/2018, 6:43 AM

## 2018-03-27 NOTE — Progress Notes (Signed)
PROGRESS NOTE    Andrew Joyce  MVH:[id number] DOB: 17-Jul-1960 DOA: 03/21/2018 PCP: [name], NP   Brief Narrative:  58 year old with past medical history relevant for history of bladder exstrophy status post surgery, urothelial renal cell carcinoma complicated by urinary bladder abdominal wall fistula, status post bilateral PCN tubes complicated by recurrent obstruction, nephrolithiasis, coronary artery disease status post CABG, hypothyroidism, gout, depression/anxiety, chronic pain who was admitted for sepsis and found to have Klebsiella bacteremia from a urinary source likely due to PCN tube malfunction as well as post renal AKI.   Assessment & Plan:   Principal Problem:   Pyelonephritis Active Problems:   CKD (chronic kidney disease) stage 3, GFR 30-59 ml/min (HCC)   Hydronephrosis   Sepsis ([place])   Anemia   Type 2 diabetes mellitus ([place])  #) Bladder/abdominal wall fistula: Ultrasound shows cystic structure concerning for soft tissue fluid collection.  Discussed with urology Dr. Alyson Ingles who felt that this could represent necrotic tissue that could possibly involve the pubic bone as well as part of the bowel.  He recommended discussing with [facility] urology about transfer as well as obtaining an MRI of the abdomen and pelvis with and without contrast. -Pending MRI with and without contrast of abdomen pelvis -IV antibiotics per above -Urology notified, -We will discuss with [facility] urology about possible transfer  #) Sepsis due to Klebsiella bacteremia from urinary source: Sepsis is resolved with IV fluids, IV antibiotics - Continue IV ceftriaxone started 03/24/2018 - From 03/21/2018 growing a Klebsiella pneumonia sensitive to ceftriaxone  #) History of bladder exstrophy/urothelial cell cancer status post PCN tubes: Patient has had multiple problems with recurrent malfunctions of PCN tubes requiring exchange -Most recently exchanged on 03/22/2018 by interventional radiology   #) Post renal AKI: Resolved after exchanging PCN tubes and IV fluids  #) Type 2 diabetes: -Hold sitagliptin -Sliding scale insulin, AC at bedtime  #) Acute on chronic anemia: This is been a longstanding issue for the patient.  Suspect a combination of possible CKD as well as underlying anemia of chronic disease. -Status post 1 packed red blood cell on 03/22/2018  #) Hypertension/hyperlipidemia/coronary artery disease status post CABG: -Hold isosorbide mononitrate -Hold losartan 100 mg daily - Continue rosuvastatin 40 mg daily - Hold amlodipine 5 mg daily -Continue fenofibrate 160 mg daily -Hold metoprolol tartrate 25 mg twice daily  #) Gout: -Continue allopurinol 100 mg daily  #) Chronic pain/psych: - Continue gabapentin 300 mg nightly - Continue 20 mg baclofen 3 times daily -Continue carbamazepine 200 mg twice daily -Continue duloxetine 20 mg nightly -Continue fentanyl patch  #) Hypothyroidism: -Continue levothyroxine 25 mcg  Fluids: Tolerating p.o. Electrolytes: Monitor and supplement Nutrition: Heart healthy carb restricted diet  Prophylaxis: Subcu heparin   Disposition: Pending ultrasound of abdomen near fistula site  Full code   Consultants:   Interventional radiology  Urology  Procedures:   Bilateral PCN tube exchange 03/22/2018  Antimicrobials:   IV ceftriaxone 03/23/2020 ongoing  IV cefepime 03/21/2020 to 03/24/2018   Subjective: This morning the patient reports continued pain on the right side of his groin.  He reports that this is somewhat better controlled on the increasing dose of IV morphine.  He denies any nausea, vomiting, diarrhea, cough, congestion, rhinorrhea, chest pain. Objective: Vitals:   03/26/18 0511 03/26/18 1343 03/26/18 2231 03/27/18 0531  BP: (!) 148/88 111/75 130/79 127/82  Pulse: 66 (!) 57 (!) 57 64  Resp: 18 (!) 24 14 11   Temp: 98.3 F (36.8 C) 98.5  F (36.9 C) 98.6 F (37 C) 98.3 F (36.8 C)  TempSrc: Oral Oral Oral  Oral  SpO2: 100% 100% 100% 99%  Weight:      Height:        Intake/Output Summary (Last 24 hours) at 03/27/2018 1017 Last data filed at 03/27/2018 0631 Gross per 24 hour  Intake 500 ml  Output 3100 ml  Net -2600 ml   Filed Weights   03/21/18 2158  Weight: 62.8 kg    Examination:  General exam: Appears calm and comfortable  Respiratory system: Clear to auscultation. Respiratory effort normal. Cardiovascular system: Regular rate and rhythm, no murmurs Gastrointestinal system: Soft, nondistended, no rebound or guarding, plus bowel sounds. Central nervous system: Alert and oriented.  Grossly intact, moving all extremities Extremities: No lower extremity edema Skin: Well-healed abdominal incisions, suprapubic fistula site with some drainage and erythema surrounding it as well as some macerations, some induration but no clear fluid collection noted, PCN tube sites are clean dry and intact Psychiatry: Judgement and insight appear normal. Mood & affect appropriate.     Data Reviewed: I have personally reviewed following labs and imaging studies  CBC: Recent Labs  Lab 03/21/18 1447 03/22/18 0527 03/22/18 1831 03/24/18 0527 03/26/18 0455 03/27/18 0508  WBC 6.6 8.9 8.5 4.7 3.8* 4.9  NEUTROABS 5.7  --   --   --   --   --   HGB 7.5* 6.8* 8.0* 7.6* 8.1* 8.3*  HCT 25.6* 22.7* 26.7* 25.3* 26.5* 27.7*  MCV 98.1 97.4 94.7 95.1 93.3 95.2  PLT 219 180 212 161 178 510   Basic Metabolic Panel: Recent Labs  Lab 03/21/18 1447 03/22/18 0527 03/24/18 0527 03/26/18 0455 03/27/18 0508  NA 139 135 138 137 138  K 4.2 3.8 3.5 3.7 3.9  CL 103 103 105 102 102  CO2 26 23 25 25 27   GLUCOSE 168* 232* 138* 174* 131*  BUN 28* 27* 26* 24* 21*  CREATININE 1.56* 1.52* 1.26* 1.10 1.09  CALCIUM 9.5 8.8* 8.7* 8.8* 9.1  MG  --   --   --  1.8  --    GFR: Estimated Creatinine Clearance: 64.8 mL/min (by C-G formula based on SCr of 1.09 mg/dL). Liver Function Tests: Recent Labs  Lab 03/21/18  1447  AST 16  ALT 10  ALKPHOS 56  BILITOT 0.3  PROT 7.8  ALBUMIN 3.4*   No results for input(s): LIPASE, AMYLASE in the last 168 hours. No results for input(s): AMMONIA in the last 168 hours. Coagulation Profile: Recent Labs  Lab 03/21/18 1447  INR 1.1   Cardiac Enzymes: No results for input(s): CKTOTAL, CKMB, CKMBINDEX, TROPONINI in the last 168 hours. BNP (last 3 results) No results for input(s): PROBNP in the last 8760 hours. HbA1C: No results for input(s): HGBA1C in the last 72 hours. CBG: Recent Labs  Lab 03/22/18 2104 03/23/18 0759 03/23/18 1207 03/23/18 1636 03/24/18 0816  GLUCAP 142* 126* 176* 121* 132*   Lipid Profile: No results for input(s): CHOL, HDL, LDLCALC, TRIG, CHOLHDL, LDLDIRECT in the last 72 hours. Thyroid Function Tests: No results for input(s): TSH, T4TOTAL, FREET4, T3FREE, THYROIDAB in the last 72 hours. Anemia Panel: No results for input(s): VITAMINB12, FOLATE, FERRITIN, TIBC, IRON, RETICCTPCT in the last 72 hours. Sepsis Labs: Recent Labs  Lab 03/21/18 1447  LATICACIDVEN 1.9    Recent Results (from the past 240 hour(s))  Culture, blood (Routine x 2)     Status: Abnormal   Collection Time: 03/21/18  2:47 PM  Result Value Ref Range Status   Specimen Description   Final    RIGHT ANTECUBITAL Performed at [facility] [address], [place], [place] [address]    Special Requests   Final    BOTTLES DRAWN AEROBIC AND ANAEROBIC Blood Culture results may not be optimal due to an excessive volume of blood received in culture bottles Performed at [facility] [address], [place], [place] [address]    Culture  Setup Time   Final    GRAM NEGATIVE RODS IN BOTH AEROBIC AND ANAEROBIC BOTTLES CRITICAL RESULT CALLED TO, READ BACK BY AND VERIFIED WITH: M. BELL, PHARMD (WL) AT 6073 ON 03/22/18 BY C. JESSUP, MLT. Performed at [facility], [place] [address] [place], [place] [address]    Culture  KLEBSIELLA PNEUMONIAE (A)  Final   Report Status 03/25/2018 FINAL  Final   Organism ID, Bacteria KLEBSIELLA PNEUMONIAE  Final      Susceptibility   Klebsiella pneumoniae - MIC*    AMPICILLIN >=32 RESISTANT Resistant     CEFAZOLIN <=4 SENSITIVE Sensitive     CEFEPIME <=1 SENSITIVE Sensitive     CEFTAZIDIME <=1 SENSITIVE Sensitive     CEFTRIAXONE <=1 SENSITIVE Sensitive     CIPROFLOXACIN <=0.25 SENSITIVE Sensitive     GENTAMICIN <=1 SENSITIVE Sensitive     IMIPENEM <=0.25 SENSITIVE Sensitive     TRIMETH/SULFA <=20 SENSITIVE Sensitive     AMPICILLIN/SULBACTAM 16 INTERMEDIATE Intermediate     PIP/TAZO <=4 SENSITIVE Sensitive     Extended ESBL NEGATIVE Sensitive     * KLEBSIELLA PNEUMONIAE  Blood Culture ID Panel (Reflexed)     Status: Abnormal   Collection Time: 03/21/18  2:47 PM  Result Value Ref Range Status   Enterococcus species NOT DETECTED NOT DETECTED Final   Listeria monocytogenes NOT DETECTED NOT DETECTED Final   Staphylococcus species NOT DETECTED NOT DETECTED Final   Staphylococcus aureus (BCID) NOT DETECTED NOT DETECTED Final   Streptococcus species NOT DETECTED NOT DETECTED Final   Streptococcus agalactiae NOT DETECTED NOT DETECTED Final   Streptococcus pneumoniae NOT DETECTED NOT DETECTED Final   Streptococcus pyogenes NOT DETECTED NOT DETECTED Final   Acinetobacter baumannii NOT DETECTED NOT DETECTED Final   Enterobacteriaceae species DETECTED (A) NOT DETECTED Final    Comment: Enterobacteriaceae represent a large family of gram-negative bacteria, not a single organism. CRITICAL RESULT CALLED TO, READ BACK BY AND VERIFIED WITH: M. BELL, PHARMD (WL) AT 6948 ON 03/22/18 BY C. JESSUP, MLT.    Enterobacter cloacae complex NOT DETECTED NOT DETECTED Final   Escherichia coli NOT DETECTED NOT DETECTED Final   Klebsiella oxytoca NOT DETECTED NOT DETECTED Final   Klebsiella pneumoniae DETECTED (A) NOT DETECTED Final    Comment: CRITICAL RESULT CALLED TO, READ BACK BY AND  VERIFIED WITH: M. BELL, PHARMD (WL) AT 1105 ON 03/22/18 BY C. JESSUP, MLT.    Proteus species NOT DETECTED NOT DETECTED Final   Serratia marcescens NOT DETECTED NOT DETECTED Final   Carbapenem resistance NOT DETECTED NOT DETECTED Final   Haemophilus influenzae NOT DETECTED NOT DETECTED Final   Neisseria meningitidis NOT DETECTED NOT DETECTED Final   Pseudomonas aeruginosa NOT DETECTED NOT DETECTED Final   Candida albicans NOT DETECTED NOT DETECTED Final   Candida glabrata NOT DETECTED NOT DETECTED Final   Candida krusei NOT DETECTED NOT DETECTED Final   Candida parapsilosis NOT DETECTED NOT DETECTED Final   Candida tropicalis NOT DETECTED NOT DETECTED Final    Comment: Performed  at [facility], [place] [address] [place], [place] [address]  Culture, blood (Routine x 2)     Status: Abnormal   Collection Time: 03/21/18  2:52 PM  Result Value Ref Range Status   Specimen Description   Final    BLOOD RIGHT FOREARM Performed at [facility] [address], [place], [place] [address]    Special Requests   Final    BOTTLES DRAWN AEROBIC AND ANAEROBIC Blood Culture adequate volume Performed at [facility] [address], [place], [place] [address]    Culture  Setup Time   Final    GRAM NEGATIVE RODS IN BOTH AEROBIC AND ANAEROBIC BOTTLES CRITICAL VALUE NOTED.  VALUE IS CONSISTENT WITH PREVIOUSLY REPORTED AND CALLED VALUE.    Culture (A)  Final    KLEBSIELLA PNEUMONIAE SUSCEPTIBILITIES PERFORMED ON PREVIOUS CULTURE WITHIN THE LAST 5 DAYS. Performed at [facility], [place] [address] [place], [place] [address]    Report Status 03/25/2018 FINAL  Final  Urine culture     Status: Abnormal   Collection Time: 03/21/18  3:28 PM  Result Value Ref Range Status   Specimen Description   Final    URINE, RANDOM Performed at [facility] [address], [place], [place] [address]    Special Requests   Final    NONE Performed at  [facility], [place] [address] [place],  [address]    Culture (A)  Final    >=100,000 COLONIES/mL ALCALIGENES FAECALIS >=100,000 COLONIES/mL PROVIDENCIA RETTGERI    Report Status 03/26/2018 FINAL  Final   Organism ID, Bacteria ALCALIGENES FAECALIS (A)  Final   Organism ID, Bacteria PROVIDENCIA RETTGERI (A)  Final      Susceptibility   Alcaligenes faecalis - MIC*    CEFEPIME 4 SENSITIVE Sensitive     CEFAZOLIN 16 SENSITIVE Sensitive     GENTAMICIN <=1 SENSITIVE Sensitive     CIPROFLOXACIN 1 SENSITIVE Sensitive     IMIPENEM 0.5 SENSITIVE Sensitive     TRIMETH/SULFA <=20 SENSITIVE Sensitive     * >=100,000 COLONIES/mL ALCALIGENES FAECALIS   Providencia rettgeri - MIC*    AMPICILLIN >=32 RESISTANT Resistant     CEFAZOLIN >=64 RESISTANT Resistant     CEFTRIAXONE <=1 SENSITIVE Sensitive     CIPROFLOXACIN <=0.25 SENSITIVE Sensitive     GENTAMICIN <=1 SENSITIVE Sensitive     IMIPENEM 2 SENSITIVE Sensitive     NITROFURANTOIN 256 RESISTANT Resistant     TRIMETH/SULFA <=20 SENSITIVE Sensitive     AMPICILLIN/SULBACTAM <=2 SENSITIVE Sensitive     PIP/TAZO <=4 SENSITIVE Sensitive     * >=100,000 COLONIES/mL PROVIDENCIA RETTGERI         Radiology Studies: US Pelvis Limited (transabdominal Only)  Result Date: 03/26/2018 CLINICAL DATA:  Pubic pain. EXAM: LIMITED ULTRASOUND OF PELVIS TECHNIQUE: Limited transabdominal ultrasound examination of the pelvis was performed. COMPARISON:  03/21/2018. FINDINGS: Ultrasound in the region of clinical concern superior to the mons pubis reveals a 2.2 x 1.9 x 1.9 cm thickly septated cystic structure. This complex fluid collection could represent a abscess. Cystic tumor would be much less likely. IMPRESSION: 2.2 x 1.9 x 1.9 cm thickening septated cystic structure in the region of clinical concern. An abscess could present in this fashion. Electronically Signed   By: Marcello Moores  Register   On: 03/26/2018 09:00        Scheduled Meds:  . allopurinol  100 mg Oral Daily  . baclofen  20  mg Oral TID  . carbamazepine  200 mg Oral BID  . DULoxetine  20 mg Oral QHS  . fenofibrate  160 mg Oral Daily  . fentaNYL  1 patch Transdermal Q72H  . heparin injection (subcutaneous)  5,000 Units Subcutaneous Q8H  . levothyroxine  25 mcg Oral Q0600  . rOPINIRole  0.25 mg Oral QHS  . rosuvastatin  40 mg Oral Daily  . sodium chloride flush  5 mL Intracatheter Q8H   Continuous Infusions: . cefTRIAXone (ROCEPHIN)  IV 2 g (03/26/18 1811)     LOS: 6 days    Time spent: [place], MD [facility]   If 7PM-7AM, please contact night-coverage [email or web address] Password [facility] 03/27/2018, 10:17 AM

## 2018-03-28 ENCOUNTER — Inpatient Hospital Stay (HOSPITAL_COMMUNITY): Payer: Medicare Other

## 2018-03-28 MED ORDER — CEFDINIR 300 MG PO CAPS: 300.0000 mg | ORAL_CAPSULE | Freq: Two times a day (BID) | ORAL | 0 refills | Status: AC

## 2018-03-28 NOTE — Progress Notes (Signed)
Pt states he got anxious and claustrophobic during MRI attempt. Pt refused to complete the exam. Will pass along to oncoming RN.

## 2018-03-28 NOTE — Discharge Instructions (Signed)
Cefdinir capsules What is this medicine? CEFDINIR (SEF di ner) is a cephalosporin antibiotic. It is used to treat certain kinds of bacterial infections. It will not work for colds, flu, or other viral infections. This medicine may be used for other purposes; ask your health care provider or pharmacist if you have questions. COMMON BRAND NAME(S): Omnicef What should I tell my health care provider before I take this medicine? They need to know if you have any of these conditions: -bleeding problems -kidney disease -stomach or intestine problems (especially colitis) -an unusual or allergic reaction to cefdinir, other cephalosporin antibiotics, penicillin, penicillamine, other foods, dyes or preservatives -pregnant or trying to get pregnant -breast-feeding How should I use this medicine? Take this medicine by mouth. Swallow it with a drink of water. Follow the directions on the prescription label. You can take it with or without food. If it upsets your stomach it may help to take it with food. Take your doses at regular intervals. Do not take it more often than directed. Finish all the medicine you are prescribed even if you think your infection is better. Talk to your pediatrician regarding the use of this medicine in children. Special care may be needed. Overdosage: If you think you have taken too much of this medicine contact a poison control center or emergency room at once. NOTE: This medicine is only for you. Do not share this medicine with others. What if I miss a dose? If you miss a dose, take it as soon as you can. If it is almost time for your next dose, take only that dose. Do not take double or extra doses. What may interact with this medicine? -antacids that contain aluminum or magnesium -iron supplements -other antibiotics -probenecid This list may not describe all possible interactions. Give your health care provider a list of all the medicines, herbs, non-prescription drugs, or  dietary supplements you use. Also tell them if you smoke, drink alcohol, or use illegal drugs. Some items may interact with your medicine. What should I watch for while using this medicine? Tell your doctor or health care professional if your symptoms do not get better in a few days. If you are diabetic you may get a false-positive result for sugar in your urine. Check with your doctor or health care professional before you change your diet or the dose of your diabetes medicine. What side effects may I notice from receiving this medicine? Side effects that you should report to your doctor or health care professional as soon as possible: -allergic reactions like skin rash, itching or hives, swelling of the face, lips, or tongue -bloody or watery diarrhea -breathing problems -fever -redness, blistering, peeling or loosening of the skin, including inside the mouth -seizures -trouble passing urine or change in the amount of urine -unusual bleeding or bruising -unusually weak or tired Side effects that usually do not require medical attention (report to your doctor or health care professional if they continue or are bothersome): -constipation -diarrhea -dizziness -dry mouth -headache -loss of appetite -nausea, vomiting -stomach pain -stool discoloration -tiredness -vaginal discharge, itching, or odor in women This list may not describe all possible side effects. Call your doctor for medical advice about side effects. You may report side effects to FDA at 1-800-FDA-1088. Where should I keep my medicine? Keep out of the reach of children. Store at room temperature between 15 and 30 degrees C (59 and 86 degrees F). Throw the medicine away after the expiration date. NOTE: This sheet  is a summary. It may not cover all possible information. If you have questions about this medicine, talk to your doctor, pharmacist, or health care provider.  2019 Elsevier/Gold Standard (2015-05-01 15:52:44)

## 2018-03-28 NOTE — Discharge Summary (Signed)
Physician Discharge Summary  Andrew Joyce EGB:[id number] DOB: September 01, 1960 DOA: 03/21/2018  PCP: [name], NP  Admit date: 03/21/2018 Discharge date: 03/28/2018  Admitted From: Home Disposition:  Home  Recommendations for Outpatient Follow-up:  1. Follow up with PCP in 1-2 weeks 2. Please obtain BMP/CBC in one week   Home Health: No Equipment/Devices: b/l PCN tubes   Discharge Condition: stable CODE STATUS: FULL Diet recommendation: Heart Healthy / Carb Modified  Brief/Interim Summary:  #) Sepsis due to Klebsiella bacteremia from urinary source: Patient was admitted with sepsis and blood cultures grew out Klebsiella.  This was presumed to be from his urine secondary to dysfunctional PCN tubes.  PCN tubes were replaced on 03/22/2018.  Patient was kept on IV ceftriaxone and discharged home on oral cefdinir to complete a total of 2 weeks of antibiotics.  #) Bladder/abdominal wall fistula: Patient began to have complaints around the site of his vesico-abdominal fistula.  An ultrasound was performed that showed a cystic structure.  Urology was notified and they felt that this should be better managed [facility] where his primary urological care is managed.  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  Patient is pending CT scan on 03/30/2018 and further discussion with [facility] urology/oncology for management.  This was deferred to them.  This case was discussed with [facility] urology Dr. Clydene Laming who felt that this cystic structure can be deferred to as an outpatient.  Of note patient did not tolerate MRI of the abdomen pelvis.  #) History of bladder exstrophy/urothelial carcinoma status post PCN tubes: Patient was admitted with PCN tube dysfunction.  These were exchanged on 03/22/2018.  #) Post renal AKI: This resolved after exchange of PCN tubes and IV fluids.  #) Type diabetes: Patient's home  sitagliptin was held.  He was maintained on sliding scale insulin here.  #) Acute on chronic anemia: Patient is a longstanding history of chronic anemia that is thought to be anemia of chronic diseases possibly related to underlying CKD.  Patient received 1 unit packed red blood cells on 03/22/2018.  #) Hypertension/hyperlipidemia/coronary artery disease status post CABG: Patient had discontinued home losartan and metoprolol tartrate due to his low blood pressures.  These were discontinued.  He was continued on rosuvastatin and fenofibrate.  He was told to continue his home isosorbide mononitrate and to follow-up with cardiology as an outpatient for up titration of coronary artery disease medications.  #) Gout: Patient was continued on home allopurinol.  #) Hypothyroidism: Patient was continued on home levothyroxine.  #) Chronic pain/psych: Patient was continued on gabapentin, baclofen, carbamazepine, duloxetine, fentanyl patch.  Patient does have outpatient follow-up with pain clinic.    Discharge Diagnoses:  Principal Problem:   Pyelonephritis Active Problems:   CKD (chronic kidney disease) stage 3, GFR 30-59 ml/min (HCC)   Hydronephrosis   Sepsis ([place])   Anemia   Type 2 diabetes mellitus [facility])    Discharge Instructions  Discharge Instructions    Call MD for:  difficulty breathing, headache or visual disturbances   Complete by:  As directed    Call MD for:  persistant dizziness or light-headedness   Complete by:  As directed    Call MD for:  persistant nausea and vomiting   Complete by:  As directed    Call MD for:  redness, tenderness, or signs of infection (pain, swelling, redness, odor or green/yellow discharge around incision site)  Complete by:  As directed    Call MD for:  severe uncontrolled pain   Complete by:  As directed    Call MD for:  temperature >100.4   Complete by:  As directed    Diet - low sodium heart healthy   Complete by:  As directed    Discharge  instructions   Complete by:  As directed    Please follow-up with your primary care doctor in 1 week.  Please [facility] urologists as an outpatient with your regularly scheduled appointment on 03/30/2018.  Please take your antibiotics as prescribed.   Increase activity slowly   Complete by:  As directed      Allergies as of 03/28/2018   No Known Allergies     Medication List    STOP taking these medications   losartan 100 MG tablet Commonly known as:  COZAAR   metoprolol tartrate 50 MG tablet Commonly known as:  LOPRESSOR     TAKE these medications   allopurinol 100 MG tablet Commonly known as:  ZYLOPRIM Take 1 tablet (100 mg total) by mouth daily.   amLODipine 5 MG tablet Commonly known as:  NORVASC Take 1 tablet (5 mg total) by mouth daily.   amoxicillin 250 MG capsule Commonly known as:  AMOXIL Take 250 mg by mouth daily.   baclofen 10 MG tablet Commonly known as:  LIORESAL TAKE 2 TABLETS BY MOUTH THREE TIMES DAILY   carbamazepine 200 MG tablet Commonly known as:  TEGRETOL TAKE 1 TABLET BY MOUTH TWICE DAILY   cefdinir 300 MG capsule Commonly known as:  OMNICEF Take 1 capsule (300 mg total) by mouth 2 (two) times daily for 7 days.   DULoxetine 20 MG capsule Commonly known as:  CYMBALTA Take 20 mg by mouth at bedtime. For Sleep Aide   fenofibrate 160 MG tablet TAKE 1 TABLET BY MOUTH ONCE DAILY   fentaNYL 12 MCG/HR Commonly known as:  [place] onto the skin.   gabapentin 300 MG capsule Commonly known as:  NEURONTIN Take 300 mg by mouth at bedtime as needed for pain.   glucose blood test strip Commonly known as:  Accu-Chek Guide Use to check blood sugars 2 times daily. Dx Code: E11.9   isosorbide mononitrate 60 MG 24 hr tablet Commonly known as:  IMDUR TAKE 1 & 1/2 (ONE & ONE-HALF) TABLETS BY MOUTH ONCE DAILY What changed:  See the new instructions.   Januvia 50 MG tablet Generic drug:  sitaGLIPtin TAKE 1 TABLET BY MOUTH ONCE  DAILY   levothyroxine 25 MCG tablet Commonly known as:  SYNTHROID, LEVOTHROID Take 1 tablet (25 mcg total) by mouth daily.   lidocaine-prilocaine cream Commonly known as:  EMLA Apply 1 application topically as needed.   meloxicam 15 MG tablet Commonly known as:  MOBIC Take 1 tablet (15 mg total) by mouth daily.   methenamine 1 g tablet Commonly known as:  MANDELAMINE Take 1,000 mg by mouth 3 (three) times daily.   nitroGLYCERIN 0.4 MG SL tablet Commonly known as:  NITROSTAT Place 1 tablet (0.4 mg total) under the tongue every 5 (five) minutes as needed. For chest pain   Oxycodone HCl 10 MG Tabs Take 10 mg by mouth daily.   rOPINIRole 0.25 MG tablet Commonly known as:  REQUIP TAKE 1 TABLET BY MOUTH 3 HOURS BEFORE BEDTIME What changed:  See the new instructions.   rosuvastatin 40 MG tablet Commonly known as:  CRESTOR Take 1 tablet (40 mg total)  by mouth daily.   sodium chloride flush 0.9 % Soln injection Flush 10 mL of saline into each nephrostomy tube daily.       No Known Allergies  Consultations:  Interventional radiology  Urology   Procedures/Studies: US Pelvis Limited (transabdominal Only)  Result Date: 03/26/2018 CLINICAL DATA:  Pubic pain. EXAM: LIMITED ULTRASOUND OF PELVIS TECHNIQUE: Limited transabdominal ultrasound examination of the pelvis was performed. COMPARISON:  03/21/2018. FINDINGS: Ultrasound in the region of clinical concern superior to the mons pubis reveals a 2.2 x 1.9 x 1.9 cm thickly septated cystic structure. This complex fluid collection could represent a abscess. Cystic tumor would be much less likely. IMPRESSION: 2.2 x 1.9 x 1.9 cm thickening septated cystic structure in the region of clinical concern. An abscess could present in this fashion. Electronically Signed   By: Marcello Moores  Register   On: 03/26/2018 09:00   Ct Renal Stone Study  Result Date: 03/21/2018 CLINICAL DATA:  History of bladder exstrophy. Pelvic pain and fever. Reported  obstruction of right nephrostomy catheter EXAM: CT ABDOMEN AND PELVIS WITHOUT CONTRAST TECHNIQUE: Multidetector CT imaging of the abdomen and pelvis was performed following the standard protocol without oral or IV contrast. COMPARISON:  November 15, 2015 FINDINGS: Lower chest: There is bibasilar atelectasis. There is no lung base edema or consolidation. Hepatobiliary: No focal liver lesions are appreciable on this noncontrast enhanced study. Gallbladder wall is not appreciably thickened. There is no biliary duct dilatation. Pancreas: No pancreatic mass or inflammatory focus. Spleen: No focal splenic lesions.  Small splenules noted medially. Adrenals/Urinary Tract: Adrenals bilaterally appear normal. There are bilateral nephrostomy catheters. There is severe dilatation of each collecting system, somewhat more severe on the right than on the left. A small amount of air is noted within an upper pole calyx on the left, likely secondary to the nephrostomy placement. There is a calculus in the lower pole of the left kidney measuring 7 x 5 mm. There is a 1 mm calculus in the lower pole left kidney. There is a cyst arising from the upper pole of the right kidney medially measuring 2.7 x 1.8 cm. There is no appreciable perinephric fluid. There is persistent marked dilatation of each ureter, a stable finding. The ureters insert ectopically along the urinary bladder on each side, likely secondary to the history of bladder exstrophy. No ureteral mass or calculus is demonstrable. There is diffuse opacification in the urinary bladder, likely hemorrhage. 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 There is diffuse soft tissue thickening in this area. Question inflammatory change versus neoplasm involving the urinary bladder. Stomach/Bowel: There is moderate stool in the colon. There is no appreciable bowel wall or mesenteric thickening. No evident bowel obstruction. There is no free air or portal venous  air. Vascular/Lymphatic: There is no abdominal aortic aneurysm. There are foci of arterial vascular calcification in the aorta and proximal right common iliac artery. There is a prominent left inguinal lymph node measuring 1.4 x 1.4 cm. No other adenopathy is appreciable. Reproductive: Prostate and seminal vesicles appear grossly normal on this noncontrast enhanced study. Other: No appendiceal inflammation evident. No frank abscess or ascites appreciable in the abdomen or pelvis. Musculoskeletal: Marked widening of the pubic symphysis is noted consistent with history of bladder exstrophy. No blastic or lytic bone lesions are identified. There is disc narrowing with arthropathy at L5-S1. No intramuscular lesions are evident. IMPRESSION: 1. There is fistulous communication between the urinary bladder in the abdominal wall extending to  the skin surface with diffuse irregular opacity in this area. Diffuse opacity in urinary bladder is noted as well as areas of air in the bladder, likely due to the fistula. Suspect hemorrhage within the bladder, although there may be neoplasm superimposed. Question abdominal wall thickening due to inflammation versus neoplasm. Both entities may be present. Clinical assessment of this area advised. A well-defined abscess is not seen in this area, although there does appear to be some loculated fluid in the rectus muscle immediately adjacent to the fistula. Note that inflammation extends inferiorly to the level of the base of the penis. Neoplastic involvement in this area cannot be excluded on this noncontrast enhanced study. 2. Nephrostomies catheters bilaterally. There is severe hydronephrosis despite presence of the nephrostomy catheters. There is marked ureterectasis bilaterally with ectopic insertions of each ureter into the bladder consistent with history of exstrophy. No ureteral calculi or ureteral masses evident. There are nonobstructing calculi in the left kidney. There is no  perinephric fluid on either side. 3. No evident bowel obstruction. No appreciable abscess in the abdomen or pelvis. Appendix region appears normal. 4. Prominent left inguinal lymph node. This lymph node enlargement could have either inflammatory or neoplastic etiology. 5. Marked widening of the pubic symphysis, a finding felt to be indicative of the known bladder exstrophy. 6.  Aortic atherosclerosis. Electronically Signed   By: Lowella Grip III M.D.   On: 03/21/2018 17:07   Ir Nephrostomy Exchange Left  Result Date: 03/22/2018 INDICATION: History of bladder exstrophy, now with chronic bilateral percutaneous nephrostomy catheters now with concern for bilateral percutaneous nephrostomy catheter extraction. Please perform fluoroscopic guided exchange and potential up sizing. EXAM: FLUOROSCOPIC GUIDED BILATERAL SIDED NEPHROSTOMY CATHETER EXCHANGE COMPARISON:  CT abdomen pelvis-03/21/2018; 11/15/2015 CONTRAST:  A total of 20 mL Isovue-300 administered was administered into both collecting systems FLUOROSCOPY TIME:  2 minutes, 12 seconds COMPLICATIONS: None immediate. TECHNIQUE: Informed written consent was obtained from the patient after a discussion of the risks, benefits and alternatives to treatment. Questions regarding the procedure were encouraged and answered. A timeout was performed prior to the initiation of the procedure. The bilateral flanks and external portions of existing nephrostomy catheters were prepped and draped in the usual sterile fashion. A sterile drape was applied covering the operative field. Maximum barrier sterile technique with sterile gowns and gloves were used for the procedure. A timeout was performed prior to the initiation of the procedure. A pre procedural spot fluoroscopic image was obtained. Beginning with the left-sided nephrostomy, a small amount of contrast was injected via the existing left-sided nephrostomy catheter demonstrating appropriate positioning within the renal  pelvis. The existing nephrostomy catheter was cut and cannulated with a short Amplatz wire however note was made of grade difficulty advancing the Amplatz wire through the entirety of the nephrostomy catheter. Ultimately, a stiff glidewire was utilized to cannulate the entirety of the nephrostomy catheter. Under intermittent fluoroscopic guidance, the existing nephrostomy catheter was exchanged for a short Kumpe catheter. Contrast injection confirmed appropriate positioning. Next, under intermittent fluoroscopic guidance, the Kumpe catheter was exchanged for a new, slightly larger now 10.2 [place] all-purpose drainage catheter. Limited contrast injection confirmed appropriate positioning within the left renal pelvis and a post exchange fluoroscopic image was obtained. The catheter was locked, secured to the skin with an interrupted suture and reconnected to a gravity bag. The identical repeat procedure was repeated for the contralateral right-sided nephrostomy, ultimately allowing successful exchange of a new 10.2 [place] all-purpose drainage catheter with end coiled and locked  within the right renal pelvis. Dressings were placed. The patient tolerated the above procedures well without immediate postprocedural complication. FINDINGS: Left-sided nephrostomy catheter is appropriately positioned though is nearly occluded with debris. After fluoroscopic guided exchange, the new, slightly larger now 10 French percutaneous drainage catheter is appropriately positioned with end coiled and locked within left renal pelvis. The right-sided nephrostomy catheter has been slightly retracted partially within an outside the right renal collecting system. After fluoroscopic guided exchange and up sizing, the new, slightly larger now 10 French percutaneous drainage catheter is appropriately positioned with end coiled and locked within the right renal pelvis. IMPRESSION: Successful fluoroscopic guided exchange and up sizing of  bilateral 10.2 French percutaneous nephrostomy catheters. Electronically Signed   By: Sandi Mariscal M.D.   On: 03/22/2018 19:30   Ir Nephrostomy Exchange Right  Result Date: 03/22/2018 INDICATION: History of bladder exstrophy, now with chronic bilateral percutaneous nephrostomy catheters now with concern for bilateral percutaneous nephrostomy catheter extraction. Please perform fluoroscopic guided exchange and potential up sizing. EXAM: FLUOROSCOPIC GUIDED BILATERAL SIDED NEPHROSTOMY CATHETER EXCHANGE COMPARISON:  CT abdomen pelvis-03/21/2018; 11/15/2015 CONTRAST:  A total of 20 mL Isovue-300 administered was administered into both collecting systems FLUOROSCOPY TIME:  2 minutes, 12 seconds COMPLICATIONS: None immediate. TECHNIQUE: Informed written consent was obtained from the patient after a discussion of the risks, benefits and alternatives to treatment. Questions regarding the procedure were encouraged and answered. A timeout was performed prior to the initiation of the procedure. The bilateral flanks and external portions of existing nephrostomy catheters were prepped and draped in the usual sterile fashion. A sterile drape was applied covering the operative field. Maximum barrier sterile technique with sterile gowns and gloves were used for the procedure. A timeout was performed prior to the initiation of the procedure. A pre procedural spot fluoroscopic image was obtained. Beginning with the left-sided nephrostomy, a small amount of contrast was injected via the existing left-sided nephrostomy catheter demonstrating appropriate positioning within the renal pelvis. The existing nephrostomy catheter was cut and cannulated with a short Amplatz wire however note was made of grade difficulty advancing the Amplatz wire through the entirety of the nephrostomy catheter. Ultimately, a stiff glidewire was utilized to cannulate the entirety of the nephrostomy catheter. Under intermittent fluoroscopic guidance, the  existing nephrostomy catheter was exchanged for a short Kumpe catheter. Contrast injection confirmed appropriate positioning. Next, under intermittent fluoroscopic guidance, the Kumpe catheter was exchanged for a new, slightly larger now 10.2 [place] all-purpose drainage catheter. Limited contrast injection confirmed appropriate positioning within the left renal pelvis and a post exchange fluoroscopic image was obtained. The catheter was locked, secured to the skin with an interrupted suture and reconnected to a gravity bag. The identical repeat procedure was repeated for the contralateral right-sided nephrostomy, ultimately allowing successful exchange of a new 10.2 [place] all-purpose drainage catheter with end coiled and locked within the right renal pelvis. Dressings were placed. The patient tolerated the above procedures well without immediate postprocedural complication. FINDINGS: Left-sided nephrostomy catheter is appropriately positioned though is nearly occluded with debris. After fluoroscopic guided exchange, the new, slightly larger now 10 French percutaneous drainage catheter is appropriately positioned with end coiled and locked within left renal pelvis. The right-sided nephrostomy catheter has been slightly retracted partially within an outside the right renal collecting system. After fluoroscopic guided exchange and up sizing, the new, slightly larger now 10 French percutaneous drainage catheter is appropriately positioned with end coiled and locked within the right renal pelvis. IMPRESSION: Successful fluoroscopic guided  exchange and up sizing of bilateral 10.2 French percutaneous nephrostomy catheters. Electronically Signed   By: Sandi Mariscal M.D.   On: 03/22/2018 19:30       Subjective:   Discharge Exam: Vitals:   03/27/18 2041 03/28/18 0651  BP: 135/79 132/82  Pulse: 62 65  Resp: 16 14  Temp: 98.7 F (37.1 C) 98.3 F (36.8 C)  SpO2: 100% 98%   Vitals:   03/27/18 0531 03/27/18  1257 03/27/18 2041 03/28/18 0651  BP: 127/82 118/62 135/79 132/82  Pulse: 64 73 62 65  Resp: 11 20 16 14   Temp: 98.3 F (36.8 C) 98.4 F (36.9 C) 98.7 F (37.1 C) 98.3 F (36.8 C)  TempSrc: Oral Oral Oral Oral  SpO2: 99% 100% 100% 98%  Weight:      Height:        General exam: Appears calm and comfortable  Respiratory system: Clear to auscultation. Respiratory effort normal. Cardiovascular system: Regular rate and rhythm, no murmurs Gastrointestinal system: Soft, nondistended, no rebound or guarding, plus bowel sounds. Central nervous system: Alert and oriented.  Grossly intact, moving all extremities Extremities: No lower extremity edema Skin: Well-healed abdominal incisions, suprapubic fistula site with some drainage and erythema surrounding it as well as some macerations, some induration but no clear fluid collection noted, PCN tube sites are clean dry and intact Psychiatry: Judgement and insight appear normal. Mood & affect appropriate.    The results of significant diagnostics from this hospitalization (including imaging, microbiology, ancillary and laboratory) are listed below for reference.     Microbiology: Recent Results (from the past 240 hour(s))  Culture, blood (Routine x 2)     Status: Abnormal   Collection Time: 03/21/18  2:47 PM  Result Value Ref Range Status   Specimen Description   Final    RIGHT ANTECUBITAL Performed at [facility] [address], [place], [place] [address]    Special Requests   Final    BOTTLES DRAWN AEROBIC AND ANAEROBIC Blood Culture results may not be optimal due to an excessive volume of blood received in culture bottles Performed at [facility] [address] [place], [place] [address]    Culture  Setup Time   Final    GRAM NEGATIVE RODS IN BOTH AEROBIC AND ANAEROBIC BOTTLES CRITICAL RESULT CALLED TO, READ BACK BY AND VERIFIED WITH: M. BELL, PHARMD (WL) AT 5621 ON 03/22/18 BY C. JESSUP,  MLT. Performed at [facility], [place] [address] [place], [place] [address]    Culture KLEBSIELLA PNEUMONIAE (A)  Final   Report Status 03/25/2018 FINAL  Final   Organism ID, Bacteria KLEBSIELLA PNEUMONIAE  Final      Susceptibility   Klebsiella pneumoniae - MIC*    AMPICILLIN >=32 RESISTANT Resistant     CEFAZOLIN <=4 SENSITIVE Sensitive     CEFEPIME <=1 SENSITIVE Sensitive     CEFTAZIDIME <=1 SENSITIVE Sensitive     CEFTRIAXONE <=1 SENSITIVE Sensitive     CIPROFLOXACIN <=0.25 SENSITIVE Sensitive     GENTAMICIN <=1 SENSITIVE Sensitive     IMIPENEM <=0.25 SENSITIVE Sensitive     TRIMETH/SULFA <=20 SENSITIVE Sensitive     AMPICILLIN/SULBACTAM 16 INTERMEDIATE Intermediate     PIP/TAZO <=4 SENSITIVE Sensitive     Extended ESBL NEGATIVE Sensitive     * KLEBSIELLA PNEUMONIAE  Blood Culture ID Panel (Reflexed)     Status: Abnormal   Collection Time: 03/21/18  2:47 PM  Result Value Ref Range Status  Enterococcus species NOT DETECTED NOT DETECTED Final   Listeria monocytogenes NOT DETECTED NOT DETECTED Final   Staphylococcus species NOT DETECTED NOT DETECTED Final   Staphylococcus aureus (BCID) NOT DETECTED NOT DETECTED Final   Streptococcus species NOT DETECTED NOT DETECTED Final   Streptococcus agalactiae NOT DETECTED NOT DETECTED Final   Streptococcus pneumoniae NOT DETECTED NOT DETECTED Final   Streptococcus pyogenes NOT DETECTED NOT DETECTED Final   Acinetobacter baumannii NOT DETECTED NOT DETECTED Final   Enterobacteriaceae species DETECTED (A) NOT DETECTED Final    Comment: Enterobacteriaceae represent a large family of gram-negative bacteria, not a single organism. CRITICAL RESULT CALLED TO, READ BACK BY AND VERIFIED WITH: M. BELL, PHARMD (WL) AT 4098 ON 03/22/18 BY C. JESSUP, MLT.    Enterobacter cloacae complex NOT DETECTED NOT DETECTED Final   Escherichia coli NOT DETECTED NOT DETECTED Final   Klebsiella oxytoca NOT DETECTED NOT DETECTED Final   Klebsiella pneumoniae  DETECTED (A) NOT DETECTED Final    Comment: CRITICAL RESULT CALLED TO, READ BACK BY AND VERIFIED WITH: M. BELL, PHARMD (WL) AT 1105 ON 03/22/18 BY C. JESSUP, MLT.    Proteus species NOT DETECTED NOT DETECTED Final   Serratia marcescens NOT DETECTED NOT DETECTED Final   Carbapenem resistance NOT DETECTED NOT DETECTED Final   Haemophilus influenzae NOT DETECTED NOT DETECTED Final   Neisseria meningitidis NOT DETECTED NOT DETECTED Final   Pseudomonas aeruginosa NOT DETECTED NOT DETECTED Final   Candida albicans NOT DETECTED NOT DETECTED Final   Candida glabrata NOT DETECTED NOT DETECTED Final   Candida krusei NOT DETECTED NOT DETECTED Final   Candida parapsilosis NOT DETECTED NOT DETECTED Final   Candida tropicalis NOT DETECTED NOT DETECTED Final    Comment: Performed at [facility], [place] [address], [place], [place] [address]  Culture, blood (Routine x 2)     Status: Abnormal   Collection Time: 03/21/18  2:52 PM  Result Value Ref Range Status   Specimen Description   Final    BLOOD RIGHT FOREARM Performed at [facility] [address] [place], [place] [address]    Special Requests   Final    BOTTLES DRAWN AEROBIC AND ANAEROBIC Blood Culture adequate volume Performed at [facility] [address], [place], [place] [address]    Culture  Setup Time   Final    GRAM NEGATIVE RODS IN BOTH AEROBIC AND ANAEROBIC BOTTLES CRITICAL VALUE NOTED.  VALUE IS CONSISTENT WITH PREVIOUSLY REPORTED AND CALLED VALUE.    Culture (A)  Final    KLEBSIELLA PNEUMONIAE SUSCEPTIBILITIES PERFORMED ON PREVIOUS CULTURE WITHIN THE LAST 5 DAYS. Performed at [facility], [place] [address], [place], [place] [address]    Report Status 03/25/2018 FINAL  Final  Urine culture     Status: Abnormal   Collection Time: 03/21/18  3:28 PM  Result Value Ref Range Status   Specimen Description   Final    URINE, RANDOM Performed at [facility]  [address], [place], [place] [address]    Special Requests   Final    NONE Performed at [facility], [place] [address] [place], [place] [address]    Culture (A)  Final    >=100,000 COLONIES/mL ALCALIGENES FAECALIS >=100,000 COLONIES/mL PROVIDENCIA RETTGERI    Report Status 03/26/2018 FINAL  Final   Organism ID, Bacteria ALCALIGENES FAECALIS (A)  Final   Organism ID, Bacteria PROVIDENCIA RETTGERI (A)  Final      Susceptibility  Alcaligenes faecalis - MIC*    CEFEPIME 4 SENSITIVE Sensitive     CEFAZOLIN 16 SENSITIVE Sensitive     GENTAMICIN <=1 SENSITIVE Sensitive     CIPROFLOXACIN 1 SENSITIVE Sensitive     IMIPENEM 0.5 SENSITIVE Sensitive     TRIMETH/SULFA <=20 SENSITIVE Sensitive     * >=100,000 COLONIES/mL ALCALIGENES FAECALIS   Providencia rettgeri - MIC*    AMPICILLIN >=32 RESISTANT Resistant     CEFAZOLIN >=64 RESISTANT Resistant     CEFTRIAXONE <=1 SENSITIVE Sensitive     CIPROFLOXACIN <=0.25 SENSITIVE Sensitive     GENTAMICIN <=1 SENSITIVE Sensitive     IMIPENEM 2 SENSITIVE Sensitive     NITROFURANTOIN 256 RESISTANT Resistant     TRIMETH/SULFA <=20 SENSITIVE Sensitive     AMPICILLIN/SULBACTAM <=2 SENSITIVE Sensitive     PIP/TAZO <=4 SENSITIVE Sensitive     * >=100,000 COLONIES/mL PROVIDENCIA RETTGERI     Labs: BNP (last 3 results) No results for input(s): BNP in the last 8760 hours. Basic Metabolic Panel: Recent Labs  Lab 03/21/18 1447 03/22/18 0527 03/24/18 0527 03/26/18 0455 03/27/18 0508  NA 139 135 138 137 138  K 4.2 3.8 3.5 3.7 3.9  CL 103 103 105 102 102  CO2 26 23 25 25 27   GLUCOSE 168* 232* 138* 174* 131*  BUN 28* 27* 26* 24* 21*  CREATININE 1.56* 1.52* 1.26* 1.10 1.09  CALCIUM 9.5 8.8* 8.7* 8.8* 9.1  MG  --   --   --  1.8  --    Liver Function Tests: Recent Labs  Lab 03/21/18 1447  AST 16  ALT 10  ALKPHOS 56  BILITOT 0.3  PROT 7.8  ALBUMIN 3.4*   No results for input(s): LIPASE, AMYLASE in the last 168 hours. No  results for input(s): AMMONIA in the last 168 hours. CBC: Recent Labs  Lab 03/21/18 1447 03/22/18 0527 03/22/18 1831 03/24/18 0527 03/26/18 0455 03/27/18 0508  WBC 6.6 8.9 8.5 4.7 3.8* 4.9  NEUTROABS 5.7  --   --   --   --   --   HGB 7.5* 6.8* 8.0* 7.6* 8.1* 8.3*  HCT 25.6* 22.7* 26.7* 25.3* 26.5* 27.7*  MCV 98.1 97.4 94.7 95.1 93.3 95.2  PLT 219 180 212 161 178 225   Cardiac Enzymes: No results for input(s): CKTOTAL, CKMB, CKMBINDEX, TROPONINI in the last 168 hours. BNP: Invalid input(s): POCBNP CBG: Recent Labs  Lab 03/22/18 2104 03/23/18 0759 03/23/18 1207 03/23/18 1636 03/24/18 0816  GLUCAP 142* 126* 176* 121* 132*   D-Dimer No results for input(s): DDIMER in the last 72 hours. Hgb A1c No results for input(s): HGBA1C in the last 72 hours. Lipid Profile No results for input(s): CHOL, HDL, LDLCALC, TRIG, CHOLHDL, LDLDIRECT in the last 72 hours. Thyroid function studies No results for input(s): TSH, T4TOTAL, T3FREE, THYROIDAB in the last 72 hours.  Invalid input(s): FREET3 Anemia work up No results for input(s): VITAMINB12, FOLATE, FERRITIN, TIBC, IRON, RETICCTPCT in the last 72 hours. Urinalysis    Component Value Date/Time   COLORURINE YELLOW 03/21/2018 1601   APPEARANCEUR HAZY (A) 03/21/2018 1601   LABSPEC 1.010 03/21/2018 1601   PHURINE 9.0 (H) 03/21/2018 1601   GLUCOSEU NEGATIVE 03/21/2018 1601   HGBUR SMALL (A) 03/21/2018 1601   BILIRUBINUR NEGATIVE 03/21/2018 1601   KETONESUR NEGATIVE 03/21/2018 1601   PROTEINUR 30 (A) 03/21/2018 1601   UROBILINOGEN 0.2 02/26/2011 0439   NITRITE NEGATIVE 03/21/2018 1601   LEUKOCYTESUR LARGE (A) 03/21/2018 1601   Sepsis Labs  Invalid input(s): PROCALCITONIN,  WBC,  LACTICIDVEN Microbiology Recent Results (from the past 240 hour(s))  Culture, blood (Routine x 2)     Status: Abnormal   Collection Time: 03/21/18  2:47 PM  Result Value Ref Range Status   Specimen Description   Final    RIGHT ANTECUBITAL Performed  at [facility] [address], [place], [place] [address]    Special Requests   Final    BOTTLES DRAWN AEROBIC AND ANAEROBIC Blood Culture results may not be optimal due to an excessive volume of blood received in culture bottles Performed at [facility] [address], [place], [place] [address]    Culture  Setup Time   Final    GRAM NEGATIVE RODS IN BOTH AEROBIC AND ANAEROBIC BOTTLES CRITICAL RESULT CALLED TO, READ BACK BY AND VERIFIED WITH: M. BELL, PHARMD (WL) AT 7371 ON 03/22/18 BY C. JESSUP, MLT. Performed at [facility], [place] [address], [place], [place] [address]    Culture KLEBSIELLA PNEUMONIAE (A)  Final   Report Status 03/25/2018 FINAL  Final   Organism ID, Bacteria KLEBSIELLA PNEUMONIAE  Final      Susceptibility   Klebsiella pneumoniae - MIC*    AMPICILLIN >=32 RESISTANT Resistant     CEFAZOLIN <=4 SENSITIVE Sensitive     CEFEPIME <=1 SENSITIVE Sensitive     CEFTAZIDIME <=1 SENSITIVE Sensitive     CEFTRIAXONE <=1 SENSITIVE Sensitive     CIPROFLOXACIN <=0.25 SENSITIVE Sensitive     GENTAMICIN <=1 SENSITIVE Sensitive     IMIPENEM <=0.25 SENSITIVE Sensitive     TRIMETH/SULFA <=20 SENSITIVE Sensitive     AMPICILLIN/SULBACTAM 16 INTERMEDIATE Intermediate     PIP/TAZO <=4 SENSITIVE Sensitive     Extended ESBL NEGATIVE Sensitive     * KLEBSIELLA PNEUMONIAE  Blood Culture ID Panel (Reflexed)     Status: Abnormal   Collection Time: 03/21/18  2:47 PM  Result Value Ref Range Status   Enterococcus species NOT DETECTED NOT DETECTED Final   Listeria monocytogenes NOT DETECTED NOT DETECTED Final   Staphylococcus species NOT DETECTED NOT DETECTED Final   Staphylococcus aureus (BCID) NOT DETECTED NOT DETECTED Final   Streptococcus species NOT DETECTED NOT DETECTED Final   Streptococcus agalactiae NOT DETECTED NOT DETECTED Final   Streptococcus pneumoniae NOT DETECTED NOT DETECTED Final   Streptococcus pyogenes NOT DETECTED NOT  DETECTED Final   Acinetobacter baumannii NOT DETECTED NOT DETECTED Final   Enterobacteriaceae species DETECTED (A) NOT DETECTED Final    Comment: Enterobacteriaceae represent a large family of gram-negative bacteria, not a single organism. CRITICAL RESULT CALLED TO, READ BACK BY AND VERIFIED WITH: M. BELL, PHARMD (WL) AT 4854 ON 03/22/18 BY C. JESSUP, MLT.    Enterobacter cloacae complex NOT DETECTED NOT DETECTED Final   Escherichia coli NOT DETECTED NOT DETECTED Final   Klebsiella oxytoca NOT DETECTED NOT DETECTED Final   Klebsiella pneumoniae DETECTED (A) NOT DETECTED Final    Comment: CRITICAL RESULT CALLED TO, READ BACK BY AND VERIFIED WITH: M. BELL, PHARMD (WL) AT 1105 ON 03/22/18 BY C. JESSUP, MLT.    Proteus species NOT DETECTED NOT DETECTED Final   Serratia marcescens NOT DETECTED NOT DETECTED Final   Carbapenem resistance NOT DETECTED NOT DETECTED Final   Haemophilus influenzae NOT DETECTED NOT DETECTED Final   Neisseria meningitidis NOT DETECTED NOT DETECTED Final   Pseudomonas aeruginosa NOT DETECTED NOT DETECTED Final   Candida albicans NOT DETECTED NOT DETECTED Final   Candida glabrata NOT  DETECTED NOT DETECTED Final   Candida krusei NOT DETECTED NOT DETECTED Final   Candida parapsilosis NOT DETECTED NOT DETECTED Final   Candida tropicalis NOT DETECTED NOT DETECTED Final    Comment: Performed at [facility], [place] [address], [place], [place] [address]  Culture, blood (Routine x 2)     Status: Abnormal   Collection Time: 03/21/18  2:52 PM  Result Value Ref Range Status   Specimen Description   Final    BLOOD RIGHT FOREARM Performed at [facility] [address], [place], [place] [address]    Special Requests   Final    BOTTLES DRAWN AEROBIC AND ANAEROBIC Blood Culture adequate volume Performed at [facility] [address] [place], [place] [address]    Culture  Setup Time   Final    GRAM NEGATIVE RODS IN BOTH AEROBIC  AND ANAEROBIC BOTTLES CRITICAL VALUE NOTED.  VALUE IS CONSISTENT WITH PREVIOUSLY REPORTED AND CALLED VALUE.    Culture (A)  Final    KLEBSIELLA PNEUMONIAE SUSCEPTIBILITIES PERFORMED ON PREVIOUS CULTURE WITHIN THE LAST 5 DAYS. Performed at [facility], [place] [address], [place], [place] [address]    Report Status 03/25/2018 FINAL  Final  Urine culture     Status: Abnormal   Collection Time: 03/21/18  3:28 PM  Result Value Ref Range Status   Specimen Description   Final    URINE, RANDOM Performed at [facility] [address], [place], [place] [address]    Special Requests   Final    NONE Performed at [facility], [place] [address] [place],  [address]    Culture (A)  Final    >=100,000 COLONIES/mL ALCALIGENES FAECALIS >=100,000 COLONIES/mL PROVIDENCIA RETTGERI    Report Status 03/26/2018 FINAL  Final   Organism ID, Bacteria ALCALIGENES FAECALIS (A)  Final   Organism ID, Bacteria PROVIDENCIA RETTGERI (A)  Final      Susceptibility   Alcaligenes faecalis - MIC*    CEFEPIME 4 SENSITIVE Sensitive     CEFAZOLIN 16 SENSITIVE Sensitive     GENTAMICIN <=1 SENSITIVE Sensitive     CIPROFLOXACIN 1 SENSITIVE Sensitive     IMIPENEM 0.5 SENSITIVE Sensitive     TRIMETH/SULFA <=20 SENSITIVE Sensitive     * >=100,000 COLONIES/mL ALCALIGENES FAECALIS   Providencia rettgeri - MIC*    AMPICILLIN >=32 RESISTANT Resistant     CEFAZOLIN >=64 RESISTANT Resistant     CEFTRIAXONE <=1 SENSITIVE Sensitive     CIPROFLOXACIN <=0.25 SENSITIVE Sensitive     GENTAMICIN <=1 SENSITIVE Sensitive     IMIPENEM 2 SENSITIVE Sensitive     NITROFURANTOIN 256 RESISTANT Resistant     TRIMETH/SULFA <=20 SENSITIVE Sensitive     AMPICILLIN/SULBACTAM <=2 SENSITIVE Sensitive     PIP/TAZO <=4 SENSITIVE Sensitive     * >=100,000 COLONIES/mL PROVIDENCIA RETTGERI     Time coordinating discharge: 35  SIGNED:   Cristy Folks, MD  [facility] 03/28/2018,  10:07 AM   If 7PM-7AM, please contact night-coverage [email or web address] Password [email or web address]

## 2018-03-30 ENCOUNTER — Telehealth: Payer: Self-pay

## 2018-03-30 DIAGNOSIS — C678 Malignant neoplasm of overlapping sites of bladder: Secondary | ICD-10-CM | POA: Diagnosis not present

## 2018-03-30 DIAGNOSIS — R911 Solitary pulmonary nodule: Secondary | ICD-10-CM | POA: Diagnosis not present

## 2018-03-30 DIAGNOSIS — R590 Localized enlarged lymph nodes: Secondary | ICD-10-CM | POA: Diagnosis not present

## 2018-03-30 DIAGNOSIS — N131 Hydronephrosis with ureteral stricture, not elsewhere classified: Secondary | ICD-10-CM | POA: Diagnosis not present

## 2018-03-30 DIAGNOSIS — N3289 Other specified disorders of bladder: Secondary | ICD-10-CM | POA: Diagnosis not present

## 2018-03-30 DIAGNOSIS — N1330 Unspecified hydronephrosis: Secondary | ICD-10-CM | POA: Diagnosis not present

## 2018-03-30 DIAGNOSIS — R1900 Intra-abdominal and pelvic swelling, mass and lump, unspecified site: Secondary | ICD-10-CM | POA: Diagnosis not present

## 2018-03-30 NOTE — Telephone Encounter (Signed)
Pt is on TCM list after admission for sepsis. Hx of malfunctionof nephrostomy tube and pyelonephritis.  LVM for pt to call back as soon as possible.

## 2018-03-31 NOTE — Telephone Encounter (Signed)
Pt is on TCM list after admission for sepis.   lvm for pt to call back to complete TCM call.

## 2018-04-01 ENCOUNTER — Ambulatory Visit: Payer: Medicare Other | Admitting: Neurology

## 2018-04-02 DIAGNOSIS — C679 Malignant neoplasm of bladder, unspecified: Secondary | ICD-10-CM | POA: Diagnosis not present

## 2018-04-08 ENCOUNTER — Ambulatory Visit: Admission: RE | Admit: 2018-04-08 | Payer: Medicare Other | Source: Ambulatory Visit | Admitting: Urology

## 2018-04-08 DIAGNOSIS — N3289 Other specified disorders of bladder: Secondary | ICD-10-CM | POA: Diagnosis not present

## 2018-04-08 DIAGNOSIS — G894 Chronic pain syndrome: Secondary | ICD-10-CM | POA: Diagnosis not present

## 2018-04-08 DIAGNOSIS — G893 Neoplasm related pain (acute) (chronic): Secondary | ICD-10-CM | POA: Diagnosis not present

## 2018-04-09 ENCOUNTER — Telehealth: Payer: Self-pay | Admitting: Urology

## 2018-04-09 DIAGNOSIS — C679 Malignant neoplasm of bladder, unspecified: Secondary | ICD-10-CM

## 2018-04-09 NOTE — Telephone Encounter (Signed)
I spoke with the patient to conduct his routine scheduled 1 month follow up visit via telephone to spare the patient unnecessary potential exposure in the healthcare setting during the current COVID-19 pandemic.  The patient was notified in advance and gave permission to proceed with this visit format.   Radiation Oncology         [phone]) [phone] ________________________________  Name: Andrew Joyce MRN: [id number]  Date: 04/09/2018  DOB: [birth year]  Post Treatment Note  CC: [name], NP  No ref. provider found  Diagnosis:   58 y.o.male with stage IVa High Grade, T2 urothelial carcinoma of the bladder with sarcomatoid features, locally advancingwith suprapubic skin erosiondespite systemic treatment     Interval Since Last Radiation:  5 weeks  01/01/2018 - 03/03/2018:Total of 64.8 Gy 1. Bladder / 45 Gy delivered in 25 fractions of 1.8 Gy 2. Boost / 19.8 Gy delivered in 11 fractions of 1.8 Gy  Narrative:  The patient tolerated radiation treatment relatively well. Patient with nephrostomy tubes which continued to drain appropriately throughout treatment. He reported nausea and vomiting around week 3 that resolved by the end of treatment. He denied any hematuria or issues with his bowels. He reported mild pain/discomfort around his nephrostomy tube site, fatigue, and occasional dysuria.           On review of systems, the patient states that he is doing well overall.  Unfortunately, he required hospitalization approximately 3 weeks after completing his radiation treatments due to blocked nephrostomy tubes leading to sepsis secondary to pyelonephritis.  A CT on admission showed urinary bladder-abdominal wall fistula as well as loculated fluid in the rectus muscle adjacent to the fistula with inflammation extending inferiorly to the base of the penis and a prominent left inguinal lymph node.  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  His  nephrostomy tubes were changed during the admission and patent prior to discharge.  Upon discharge, he was advised to follow-up with his urology and oncology team at [facility] given the complexity of his disease.  Since the time of discharge, he has continued with achy pelvic pain but reports that his nephrostomy tubes are draining appropriately and denies any recent fever, chills or night sweats.  He denies any ill side effects associated with his recent radiation treatments.  After further discussion with his oncologist at [facility], Dr. Vito Berger, the recommendation is to proceed with immunotherapy with pembrolizumab due to evidence for progressive pelvic adenopathy on recent CT scans.  He prefers to proceed with any systemic treatments locally with Dr. Alen Blew.   ALLERGIES:  has No Known Allergies.  Meds: Current Outpatient Medications  Medication Sig Dispense Refill   allopurinol (ZYLOPRIM) 100 MG tablet Take 1 tablet (100 mg total) by mouth daily. 30 tablet 6   amLODipine (NORVASC) 5 MG tablet Take 1 tablet (5 mg total) by mouth daily. 30 tablet 6   amoxicillin (AMOXIL) 250 MG capsule Take 250 mg by mouth daily.  11   baclofen (LIORESAL) 10 MG tablet TAKE 2 TABLETS BY MOUTH THREE TIMES DAILY (Patient taking differently: Take 20 mg by mouth 3 (three) times daily. ) 180 each 5   carbamazepine (TEGRETOL) 200 MG tablet TAKE 1 TABLET BY MOUTH TWICE DAILY (Patient taking differently: Take 200 mg by mouth 2 (two) times daily. ) 60 tablet 5   DULoxetine (CYMBALTA) 20 MG capsule Take 20 mg by mouth at bedtime. For Sleep Aide  2   fenofibrate 160  MG tablet TAKE 1 TABLET BY MOUTH ONCE DAILY (Patient taking differently: Take 160 mg by mouth daily. ) 90 tablet 1   fentaNYL (DURAGESIC - DOSED MCG/HR) 12 MCG/HR Place onto the skin.     gabapentin (NEURONTIN) 300 MG capsule Take 300 mg by mouth at bedtime as needed for pain.     glucose blood (ACCU-CHEK GUIDE) test strip Use to check blood sugars 2 times daily. Dx  Code: E11.9 100 each 12   isosorbide mononitrate (IMDUR) 60 MG 24 hr tablet TAKE 1 & 1/2 (ONE & ONE-HALF) TABLETS BY MOUTH ONCE DAILY (Patient taking differently: Take 60-90 mg by mouth daily. ) 135 tablet 2   JANUVIA 50 MG tablet TAKE 1 TABLET BY MOUTH ONCE DAILY 90 tablet 0   levothyroxine (SYNTHROID, LEVOTHROID) 25 MCG tablet Take 1 tablet (25 mcg total) by mouth daily. 30 tablet 1   lidocaine-prilocaine (EMLA) cream Apply 1 application topically as needed. 30 g 0   meloxicam (MOBIC) 15 MG tablet Take 1 tablet (15 mg total) by mouth daily. 30 tablet 1   methenamine (MANDELAMINE) 1 g tablet Take 1,000 mg by mouth 3 (three) times daily.      nitroGLYCERIN (NITROSTAT) 0.4 MG SL tablet Place 1 tablet (0.4 mg total) under the tongue every 5 (five) minutes as needed. For chest pain 25 tablet 3   Oxycodone HCl 10 MG TABS Take 10 mg by mouth daily.      rOPINIRole (REQUIP) 0.25 MG tablet TAKE 1 TABLET BY MOUTH 3 HOURS BEFORE BEDTIME (Patient taking differently: Take 0.25 mg by mouth at bedtime. TAKE 1 TABLET BY MOUTH 3 HOURS BEFORE BEDTIME) 30 tablet 5   rosuvastatin (CRESTOR) 40 MG tablet Take 1 tablet (40 mg total) by mouth daily. 30 tablet 4   sodium chloride flush 0.9 % SOLN injection Flush 10 mL of saline into each nephrostomy tube daily.     No current facility-administered medications for this visit.     Physical Findings: Not performed due to telephone follow-up visit format.  Lab Findings: Lab Results  Component Value Date   WBC 4.9 03/27/2018   HGB 8.3 (L) 03/27/2018   HCT 27.7 (L) 03/27/2018   MCV 95.2 03/27/2018   PLT 225 03/27/2018    Radiographic Findings: Mr Pelvis Wo Contrast  Result Date: 03/28/2018 CLINICAL DATA:  58 year old male with history of bladder exstrophy complicated by transitional cell carcinoma of the bladder status post radiation therapy who presented to the emergency department with fever and poorly draining nephrostomy tubes. Vesicocutaneous  fistula. EXAM: MRI PELVIS WITHOUT CONTRAST TECHNIQUE: Multiplanar multisequence MR imaging of the pelvis was performed. No intravenous contrast was administered. COMPARISON:  No prior abdominal MRI. CT the abdomen and pelvis 03/21/2018. FINDINGS: Comment: Today's study is extremely limited both by lack of IV gadolinium, and by incomplete imaging (per report, after partial acquisition of images, the patient refused to continue with the examination). Urinary Tract: Urinary bladder appears decompressed, and is markedly thick-walled and irregular in appearance, suggesting diffusely infiltrative neoplasm throughout the urinary bladder wall (poorly evaluated on today's noncontrast examination). This infiltrative bladder mass is estimated to measure approximately 5.8 x 7.3 x 6.1 cm (axial image 13 of series 5 and sagittal image 18 of series 4). This appears to communicate with the skin surface anteriorly, compatible with the reported clinical history of vesicocutaneous fistula. Severe dilatation of the distal third of the left ureter. Distal right ureter appears decompressed. Bowel:  Visualized portions are unremarkable. Vascular/Lymphatic: No aneurysm identified  in the pelvic vasculature on today's noncontrast examination. No definite lymphadenopathy noted on today's examination. Reproductive: Prostate gland and seminal vesicles appear grossly unremarkable. Other: No significant volume of ascites noted in the visualized portions of the peritoneal cavity. Musculoskeletal: Chronic widening of the pubic symphysis related to history of bladder exstrophy. IMPRESSION: 1. 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 Electronically Signed   By: Vinnie Langton M.D.   On: 03/28/2018 10:24   US Pelvis Limited (transabdominal Only)  Result Date: 03/26/2018 CLINICAL DATA:  Pubic pain. EXAM: LIMITED ULTRASOUND OF PELVIS TECHNIQUE: Limited transabdominal  ultrasound examination of the pelvis was performed. COMPARISON:  03/21/2018. FINDINGS: Ultrasound in the region of clinical concern superior to the mons pubis reveals a 2.2 x 1.9 x 1.9 cm thickly septated cystic structure. This complex fluid collection could represent a abscess. Cystic tumor would be much less likely. IMPRESSION: 2.2 x 1.9 x 1.9 cm thickening septated cystic structure in the region of clinical concern. An abscess could present in this fashion. Electronically Signed   By: Marcello Moores  Register   On: 03/26/2018 09:00   Ct Renal Stone Study  Result Date: 03/21/2018 CLINICAL DATA:  History of bladder exstrophy. Pelvic pain and fever. Reported obstruction of right nephrostomy catheter EXAM: CT ABDOMEN AND PELVIS WITHOUT CONTRAST TECHNIQUE: Multidetector CT imaging of the abdomen and pelvis was performed following the standard protocol without oral or IV contrast. COMPARISON:  November 15, 2015 FINDINGS: Lower chest: There is bibasilar atelectasis. There is no lung base edema or consolidation. Hepatobiliary: No focal liver lesions are appreciable on this noncontrast enhanced study. Gallbladder wall is not appreciably thickened. There is no biliary duct dilatation. Pancreas: No pancreatic mass or inflammatory focus. Spleen: No focal splenic lesions.  Small splenules noted medially. Adrenals/Urinary Tract: Adrenals bilaterally appear normal. There are bilateral nephrostomy catheters. There is severe dilatation of each collecting system, somewhat more severe on the right than on the left. A small amount of air is noted within an upper pole calyx on the left, likely secondary to the nephrostomy placement. There is a calculus in the lower pole of the left kidney measuring 7 x 5 mm. There is a 1 mm calculus in the lower pole left kidney. There is a cyst arising from the upper pole of the right kidney medially measuring 2.7 x 1.8 cm. There is no appreciable perinephric fluid. There is persistent marked dilatation  of each ureter, a stable finding. The ureters insert ectopically along the urinary bladder on each side, likely secondary to the history of bladder exstrophy. No ureteral mass or calculus is demonstrable. There is diffuse opacification in the urinary bladder, likely hemorrhage. 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 There is diffuse soft tissue thickening in this area. Question inflammatory change versus neoplasm involving the urinary bladder. Stomach/Bowel: There is moderate stool in the colon. There is no appreciable bowel wall or mesenteric thickening. No evident bowel obstruction. There is no free air or portal venous air. Vascular/Lymphatic: There is no abdominal aortic aneurysm. There are foci of arterial vascular calcification in the aorta and proximal right common iliac artery. There is a prominent left inguinal lymph node measuring 1.4 x 1.4 cm. No other adenopathy is appreciable. Reproductive: Prostate and seminal vesicles appear grossly normal on this noncontrast enhanced study. Other: No appendiceal inflammation evident. No frank abscess or ascites appreciable in the abdomen or pelvis. Musculoskeletal: Marked widening of the pubic symphysis is  noted consistent with history of bladder exstrophy. No blastic or lytic bone lesions are identified. There is disc narrowing with arthropathy at L5-S1. No intramuscular lesions are evident. IMPRESSION: 1. 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 Diffuse opacity in urinary bladder is noted as well as areas of air in the bladder, likely due to the fistula. Suspect hemorrhage within the bladder, although there may be neoplasm superimposed. Question abdominal wall thickening due to inflammation versus neoplasm. Both entities may be present. Clinical assessment of this area advised. A well-defined abscess is not seen in  this area, although there does appear to be some loculated fluid in the rectus muscle immediately adjacent to the fistula. Note that inflammation extends inferiorly to the level of the base of the penis. Neoplastic involvement in this area cannot be excluded on this noncontrast enhanced study. 2. Nephrostomies catheters bilaterally. There is severe hydronephrosis despite presence of the nephrostomy catheters. There is marked ureterectasis bilaterally with ectopic insertions of each ureter into the bladder consistent with history of exstrophy. No ureteral calculi or ureteral masses evident. There are nonobstructing calculi in the left kidney. There is no perinephric fluid on either side. 3. No evident bowel obstruction. No appreciable abscess in the abdomen or pelvis. Appendix region appears normal. 4. Prominent left inguinal lymph node. This lymph node enlargement could have either inflammatory or neoplastic etiology. 5. Marked widening of the pubic symphysis, a finding felt to be indicative of the known bladder exstrophy. 6.  Aortic atherosclerosis. Electronically Signed   By: Lowella Grip III M.D.   On: 03/21/2018 17:07   Ir Nephrostomy Exchange Left  Result Date: 03/22/2018 INDICATION: History of bladder exstrophy, now with chronic bilateral percutaneous nephrostomy catheters now with concern for bilateral percutaneous nephrostomy catheter extraction. Please perform fluoroscopic guided exchange and potential up sizing. EXAM: FLUOROSCOPIC GUIDED BILATERAL SIDED NEPHROSTOMY CATHETER EXCHANGE COMPARISON:  CT abdomen pelvis-03/21/2018; 11/15/2015 CONTRAST:  A total of 20 mL Isovue-300 administered was administered into both collecting systems FLUOROSCOPY TIME:  2 minutes, 12 seconds COMPLICATIONS: None immediate. TECHNIQUE: Informed written consent was obtained from the patient after a discussion of the risks, benefits and alternatives to treatment. Questions regarding the procedure were encouraged and  answered. A timeout was performed prior to the initiation of the procedure. The bilateral flanks and external portions of existing nephrostomy catheters were prepped and draped in the usual sterile fashion. A sterile drape was applied covering the operative field. Maximum barrier sterile technique with sterile gowns and gloves were used for the procedure. A timeout was performed prior to the initiation of the procedure. A pre procedural spot fluoroscopic image was obtained. Beginning with the left-sided nephrostomy, a small amount of contrast was injected via the existing left-sided nephrostomy catheter demonstrating appropriate positioning within the renal pelvis. The existing nephrostomy catheter was cut and cannulated with a short Amplatz wire however note was made of grade difficulty advancing the Amplatz wire through the entirety of the nephrostomy catheter. Ultimately, a stiff glidewire was utilized to cannulate the entirety of the nephrostomy catheter. Under intermittent fluoroscopic guidance, the existing nephrostomy catheter was exchanged for a short Kumpe catheter. Contrast injection confirmed appropriate positioning. Next, under intermittent fluoroscopic guidance, the Kumpe catheter was exchanged for a new, slightly larger now 10.2 [place] all-purpose drainage catheter. Limited contrast injection confirmed appropriate positioning within the left renal pelvis and a post exchange fluoroscopic image was obtained. The catheter  was locked, secured to the skin with an interrupted suture and reconnected to a gravity bag. The identical repeat procedure was repeated for the contralateral right-sided nephrostomy, ultimately allowing successful exchange of a new 10.2 [place] all-purpose drainage catheter with end coiled and locked within the right renal pelvis. Dressings were placed. The patient tolerated the above procedures well without immediate postprocedural complication. FINDINGS: Left-sided nephrostomy catheter  is appropriately positioned though is nearly occluded with debris. After fluoroscopic guided exchange, the new, slightly larger now 10 French percutaneous drainage catheter is appropriately positioned with end coiled and locked within left renal pelvis. The right-sided nephrostomy catheter has been slightly retracted partially within an outside the right renal collecting system. After fluoroscopic guided exchange and up sizing, the new, slightly larger now 10 French percutaneous drainage catheter is appropriately positioned with end coiled and locked within the right renal pelvis. IMPRESSION: Successful fluoroscopic guided exchange and up sizing of bilateral 10.2 French percutaneous nephrostomy catheters. Electronically Signed   By: Sandi Mariscal M.D.   On: 03/22/2018 19:30   Ir Nephrostomy Exchange Right  Result Date: 03/22/2018 INDICATION: History of bladder exstrophy, now with chronic bilateral percutaneous nephrostomy catheters now with concern for bilateral percutaneous nephrostomy catheter extraction. Please perform fluoroscopic guided exchange and potential up sizing. EXAM: FLUOROSCOPIC GUIDED BILATERAL SIDED NEPHROSTOMY CATHETER EXCHANGE COMPARISON:  CT abdomen pelvis-03/21/2018; 11/15/2015 CONTRAST:  A total of 20 mL Isovue-300 administered was administered into both collecting systems FLUOROSCOPY TIME:  2 minutes, 12 seconds COMPLICATIONS: None immediate. TECHNIQUE: Informed written consent was obtained from the patient after a discussion of the risks, benefits and alternatives to treatment. Questions regarding the procedure were encouraged and answered. A timeout was performed prior to the initiation of the procedure. The bilateral flanks and external portions of existing nephrostomy catheters were prepped and draped in the usual sterile fashion. A sterile drape was applied covering the operative field. Maximum barrier sterile technique with sterile gowns and gloves were used for the procedure. A  timeout was performed prior to the initiation of the procedure. A pre procedural spot fluoroscopic image was obtained. Beginning with the left-sided nephrostomy, a small amount of contrast was injected via the existing left-sided nephrostomy catheter demonstrating appropriate positioning within the renal pelvis. The existing nephrostomy catheter was cut and cannulated with a short Amplatz wire however note was made of grade difficulty advancing the Amplatz wire through the entirety of the nephrostomy catheter. Ultimately, a stiff glidewire was utilized to cannulate the entirety of the nephrostomy catheter. Under intermittent fluoroscopic guidance, the existing nephrostomy catheter was exchanged for a short Kumpe catheter. Contrast injection confirmed appropriate positioning. Next, under intermittent fluoroscopic guidance, the Kumpe catheter was exchanged for a new, slightly larger now 10.2 [place] all-purpose drainage catheter. Limited contrast injection confirmed appropriate positioning within the left renal pelvis and a post exchange fluoroscopic image was obtained. The catheter was locked, secured to the skin with an interrupted suture and reconnected to a gravity bag. The identical repeat procedure was repeated for the contralateral right-sided nephrostomy, ultimately allowing successful exchange of a new 10.2 [place] all-purpose drainage catheter with end coiled and locked within the right renal pelvis. Dressings were placed. The patient tolerated the above procedures well without immediate postprocedural complication. FINDINGS: Left-sided nephrostomy catheter is appropriately positioned though is nearly occluded with debris. After fluoroscopic guided exchange, the new, slightly larger now 10 French percutaneous drainage catheter is appropriately positioned with end coiled and locked within left renal pelvis. The right-sided nephrostomy catheter has been slightly  retracted partially within an outside the right  renal collecting system. After fluoroscopic guided exchange and up sizing, the new, slightly larger now 10 French percutaneous drainage catheter is appropriately positioned with end coiled and locked within the right renal pelvis. IMPRESSION: Successful fluoroscopic guided exchange and up sizing of bilateral 10.2 French percutaneous nephrostomy catheters. Electronically Signed   By: Sandi Mariscal M.D.   On: 03/22/2018 19:30   Impression/Plan: 1. 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 He has recovered well from the effects of his recent palliative bladder radiation.  Based on recent discussion with his oncologist at [facility], Dr. Vito Berger, the recommendation is to proceed with immunotherapy with pembrolizumab due to evidence for progressive pelvic adenopathy on recent CT scans.  He prefers to proceed with any systemic treatments locally with Dr. Alen Blew.  He does not currently have a follow-up visit scheduled with Dr. Alen Blew or treatment start dates scheduled.  I advised that I will share this information with Dr. Alen Blew and encouraged him to reach back out to schedule a follow-up visit for further discussion.  We discussed that while we are happy to continue to participate in his care if clinically indicated, at this point, we will plan to see him back on an as-needed basis.  He knows to call at anytime with any questions or concerns related to his previous radiotherapy.  He appears to have a good understanding of our recommendations and is in agreement with the stated plan.      Nicholos Johns, PA-C

## 2018-04-10 NOTE — Telephone Encounter (Signed)
I will take care of it  Thanks

## 2018-04-13 ENCOUNTER — Telehealth: Payer: Self-pay | Admitting: Oncology

## 2018-04-13 NOTE — Telephone Encounter (Signed)
Spoke with patient re 4/7 f/u

## 2018-04-14 ENCOUNTER — Other Ambulatory Visit: Payer: Self-pay

## 2018-04-14 ENCOUNTER — Telehealth: Payer: Self-pay | Admitting: Oncology

## 2018-04-14 ENCOUNTER — Inpatient Hospital Stay: Payer: Medicare Other | Attending: Oncology | Admitting: Oncology

## 2018-04-14 VITALS — BP 132/78 | HR 94 | Temp 98.6°F | Resp 18 | Ht 70.0 in | Wt 137.6 lb

## 2018-04-14 DIAGNOSIS — R109 Unspecified abdominal pain: Secondary | ICD-10-CM

## 2018-04-14 DIAGNOSIS — Z95828 Presence of other vascular implants and grafts: Secondary | ICD-10-CM | POA: Diagnosis not present

## 2018-04-14 DIAGNOSIS — Z7189 Other specified counseling: Secondary | ICD-10-CM

## 2018-04-14 DIAGNOSIS — C679 Malignant neoplasm of bladder, unspecified: Secondary | ICD-10-CM | POA: Diagnosis not present

## 2018-04-14 DIAGNOSIS — N189 Chronic kidney disease, unspecified: Secondary | ICD-10-CM | POA: Insufficient documentation

## 2018-04-14 DIAGNOSIS — Z5112 Encounter for antineoplastic immunotherapy: Secondary | ICD-10-CM | POA: Insufficient documentation

## 2018-04-14 DIAGNOSIS — C775 Secondary and unspecified malignant neoplasm of intrapelvic lymph nodes: Secondary | ICD-10-CM | POA: Insufficient documentation

## 2018-04-14 DIAGNOSIS — Z79899 Other long term (current) drug therapy: Secondary | ICD-10-CM | POA: Insufficient documentation

## 2018-04-14 NOTE — Progress Notes (Signed)
DISCONTINUE ON PATHWAY REGIMEN - Bladder     A cycle is every 21 days:     Carboplatin      Gemcitabine   **Always confirm dose/schedule in your pharmacy ordering system**  REASON: Disease Progression PRIOR TREATMENT: [id number]: Carboplatin AUC=5 D1 + Gemcitabine 1,000 mg/m2 D1, 8 q21 Days for a Maximum of 6 Cycles TREATMENT RESPONSE: Progressive Disease (PD)  START ON PATHWAY REGIMEN - Bladder     A cycle is 21 days:     Pembrolizumab   **Always confirm dose/schedule in your pharmacy ordering system**  Patient Characteristics: Metastatic Disease, Second Line, FGFR2/FGFR3 Mutation Negative or Unknown Therapeutic Status: Metastatic Disease Line of Therapy: Second Line FGFR2/FGFR3 Mutation Status: Negative Intent of Therapy: Curative Intent, Discussed with Patient

## 2018-04-14 NOTE — Telephone Encounter (Signed)
Scheduled appt per 4/7 sch message. ° °Patient aware of appt date and time. °

## 2018-04-14 NOTE — Progress Notes (Signed)
Hematology and Oncology Follow Up Visit  Andrew Joyce [id number] [birth year] 58 y.o. 04/14/2018 9:23 AM [name], NPShambley, Delphia Grates, NP   Principle Diagnosis: 58 year old man with stage IV bladder cancer diagnosed in July 2019.  He was found to have pelvic adenopathy and high-grade urothelial carcinoma of the bladder.  Prior Therapy:  He is status post TURBT on July 23, 2017 which showed high-grade urothelial carcinoma with sarcomatoid feature.  Systemic chemotherapy utilizing gemcitabine and cisplatin cycle 1 started on September 16, 2017.  He completed 3 cycles of therapy in October 2019.  He is status post definitive therapy with radiation completed to the pelvis on March 03, 2018.  Current therapy: Under evaluation for salvage therapy.    Interim History: Andrew Joyce is here for a follow-up visit.  Since the last visit, he completed 3 cycles of chemotherapy with progression of disease and received palliative radiation therapy to the bladder.  Imaging studies obtained March 23 of 2020 showed central necrosis of his urinary bladder with interval enlargement of left inguinal and external iliac lymph node concerning for metastatic disease.  Clinically he reports no major complaints at this time.  He does report some mild abdominal discomfort but no hematuria or dysuria.  His appetite is excellent and his performance status is not changed.  Continues to ambulate and drive without any difficulties.   He denied any alteration mental status, neuropathy, confusion or dizziness.  Denies any headaches or lethargy.  Denies any night sweats, weight loss or changes in appetite.  Denied orthopnea, dyspnea on exertion or chest discomfort.  Denies shortness of breath, difficulty breathing hemoptysis or cough.  Denies any abdominal distention, nausea, early satiety or dyspepsia.  Denies any hematuria, frequency, dysuria or nocturia.  Denies any skin irritation, dryness or rash.   Denies any ecchymosis or petechiae.  Denies any lymphadenopathy or clotting.  Denies any heat or cold intolerance.  Denies any anxiety or depression.  Remaining review of system is negative.        Medications: I have reviewed the patient's current medications.  Current Outpatient Medications  Medication Sig Dispense Refill  . allopurinol (ZYLOPRIM) 100 MG tablet Take 1 tablet (100 mg total) by mouth daily. 30 tablet 6  . amLODipine (NORVASC) 5 MG tablet Take 1 tablet (5 mg total) by mouth daily. 30 tablet 6  . amoxicillin (AMOXIL) 250 MG capsule Take 250 mg by mouth daily.  11  . baclofen (LIORESAL) 10 MG tablet TAKE 2 TABLETS BY MOUTH THREE TIMES DAILY (Patient taking differently: Take 20 mg by mouth 3 (three) times daily. ) 180 each 5  . carbamazepine (TEGRETOL) 200 MG tablet TAKE 1 TABLET BY MOUTH TWICE DAILY (Patient taking differently: Take 200 mg by mouth 2 (two) times daily. ) 60 tablet 5  . DULoxetine (CYMBALTA) 20 MG capsule Take 20 mg by mouth at bedtime. For Sleep Aide  2  . fenofibrate 160 MG tablet TAKE 1 TABLET BY MOUTH ONCE DAILY (Patient taking differently: Take 160 mg by mouth daily. ) 90 tablet 1  . fentaNYL (DURAGESIC - DOSED MCG/HR) 12 MCG/HR Place onto the skin.    [name] gabapentin (NEURONTIN) 300 MG capsule Take 300 mg by mouth at bedtime as needed for pain.    [name] glucose blood (ACCU-CHEK GUIDE) test strip Use to check blood sugars 2 times daily. Dx Code: E11.9 100 each 12  . isosorbide mononitrate (IMDUR) 60 MG 24 hr tablet TAKE 1 & 1/2 (ONE & ONE-HALF) TABLETS BY  MOUTH ONCE DAILY (Patient taking differently: Take 60-90 mg by mouth daily. ) 135 tablet 2  . JANUVIA 50 MG tablet TAKE 1 TABLET BY MOUTH ONCE DAILY 90 tablet 0  . levothyroxine (SYNTHROID, LEVOTHROID) 25 MCG tablet Take 1 tablet (25 mcg total) by mouth daily. 30 tablet 1  . lidocaine-prilocaine (EMLA) cream Apply 1 application topically as needed. 30 g 0  . meloxicam (MOBIC) 15 MG tablet Take 1 tablet (15 mg  total) by mouth daily. 30 tablet 1  . methenamine (MANDELAMINE) 1 g tablet Take 1,000 mg by mouth 3 (three) times daily.     . nitroGLYCERIN (NITROSTAT) 0.4 MG SL tablet Place 1 tablet (0.4 mg total) under the tongue every 5 (five) minutes as needed. For chest pain 25 tablet 3  . Oxycodone HCl 10 MG TABS Take 10 mg by mouth daily.     [name] rOPINIRole (REQUIP) 0.25 MG tablet TAKE 1 TABLET BY MOUTH 3 HOURS BEFORE BEDTIME (Patient taking differently: Take 0.25 mg by mouth at bedtime. TAKE 1 TABLET BY MOUTH 3 HOURS BEFORE BEDTIME) 30 tablet 5  . rosuvastatin (CRESTOR) 40 MG tablet Take 1 tablet (40 mg total) by mouth daily. 30 tablet 4  . sodium chloride flush 0.9 % SOLN injection Flush 10 mL of saline into each nephrostomy tube daily.     No current facility-administered medications for this visit.      Allergies: No Known Allergies  Past Medical History, Surgical history, Social history, and Family History were reviewed and updated.    Physical Exam: Blood pressure 132/78, pulse 94, temperature 98.6 F (37 C), temperature source Oral, resp. rate 18, height 5\' 10"  (1.778 m), weight 137 lb 9.6 oz (62.4 kg), SpO2 99 %.    ECOG: 0   General appearance: Alert, awake without any distress. Head: Atraumatic without abnormalities Oropharynx: Without any thrush or ulcers. Eyes: No scleral icterus. Lymph nodes: No lymphadenopathy noted in the cervical, supraclavicular, or axillary nodes Heart:regular rate and rhythm, without any murmurs or gallops.   Lung: Clear to auscultation without any rhonchi, wheezes or dullness to percussion. Abdomin: Soft, nontender without any shifting dullness or ascites. Musculoskeletal: No clubbing or cyanosis. Neurological: No motor or sensory deficits. Skin: No rashes or lesions.      Lab Results: Lab Results  Component Value Date   WBC 4.9 03/27/2018   HGB 8.3 (L) 03/27/2018   HCT 27.7 (L) 03/27/2018   MCV 95.2 03/27/2018   PLT 225 03/27/2018      Chemistry      Component Value Date/Time   NA 138 03/27/2018 0508   NA 141 07/24/2017 0942   K 3.9 03/27/2018 0508   CL 102 03/27/2018 0508   CO2 27 03/27/2018 0508   BUN 21 (H) 03/27/2018 0508   BUN 35 (H) 07/24/2017 0942   CREATININE 1.09 03/27/2018 0508   CREATININE 2.03 (H) 11/04/2017 0755   CREATININE 1.38 (H) 03/01/2015 0942      Component Value Date/Time   CALCIUM 9.1 03/27/2018 0508   ALKPHOS 56 03/21/2018 1447   AST 16 03/21/2018 1447   AST 31 11/04/2017 0755   ALT 10 03/21/2018 1447   ALT 28 11/04/2017 0755   BILITOT 0.3 03/21/2018 1447   BILITOT 0.3 11/04/2017 0755     IMPRESSION: 1. 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   Impression and Plan:  58 year old man with the following:  1.  Stage IV bladder cancer diagnosed in  July 2019.  His disease predominantly within the bladder and pelvic adenopathy without any visceral involvement.  He is status post treatment outlined above with recent imaging studies in March 2020 showed progression of disease predominantly lymphadenopathy.  Treatment options were reviewed today which include Pembrolizumab versus different salvage chemotherapy as well as Padcev.  Complication associated with immunotherapy were reviewed today.  These include nausea, fatigue, rash, pruritus, thyroid disease as well as pneumonitis and colitis which can be severe.  After discussion today is agreeable to proceed with Pembrolizumab which will be received every 3 weeks with repeat imaging after 3 cycles.  Plan to start in the near future.    2.  IV access: Port-A-Cath remains in place without any issues or complications.  Will be used for subsequent chemotherapy.  3.  Antiemetics: Antiemetics are available to him.  4.  Chronic renal insufficiency: Creatinine relatively stable at this time.  5.  Goals of care: Therapy is palliative at this time.  His performance  status is excellent and aggressive therapy is warranted.  6.  Anemia: Related to malignancy and chronic disease.  Hemoglobin relatively stable.  7.  Follow-up: In the immediate future to start Pembrolizumab and will have repeat evaluation in 3 weeks.  25 minutes was spent with the patient face-to-face today.  More than 50% of time was spent on reviewing his disease status, treatment options and complications related therapy.    Zola Button, MD 4/7/20209:23 AM

## 2018-04-16 ENCOUNTER — Telehealth: Payer: Self-pay | Admitting: *Deleted

## 2018-04-16 ENCOUNTER — Encounter: Payer: Self-pay | Admitting: *Deleted

## 2018-04-16 ENCOUNTER — Telehealth: Payer: Self-pay | Admitting: Cardiology

## 2018-04-16 NOTE — Telephone Encounter (Signed)
LVM to schedule. 04-16-18 ST

## 2018-04-16 NOTE — Telephone Encounter (Signed)
   Cardiac Questionnaire:    Since your last visit or hospitalization:    1. Have you been having new or worsening chest pain? NO   2. Have you been having new or worsening shortness of breath? NO 3. Have you been having new or worsening leg swelling, wt gain, or increase in abdominal girth (pants fitting more tightly)? NO   4. Have you had any passing out spells? NO    *A YES to any of these questions would result in the appointment being kept. *If all the answers to these questions are NO, we should indicate that given the current situation regarding the worldwide coronarvirus pandemic, at the recommendation of the CDC, we are looking to limit gatherings in our waiting area, and thus will reschedule their appointment beyond four weeks from today.   _____________   [id number] Pre-Screening Questions:  . Do you currently have a fever? NO . Have you recently travelled on a cruise, internationally, or to [place], [place], [place], [place], [place], or [place], [place] [employer])? NO . Have you been in contact with someone that is currently pending confirmation of Covid19 testing or has been confirmed to have the [place] virus? NO Are you currently experiencing fatigue or cough? NO     Spoke with patient and he would like to reschedule his appointment for Monday April 20, 2018 to a later date after this covid19 crisis is over. We went over the patient's medications, pharmacy, and history.

## 2018-04-17 ENCOUNTER — Telehealth: Payer: Self-pay | Admitting: Oncology

## 2018-04-17 ENCOUNTER — Other Ambulatory Visit: Payer: Self-pay

## 2018-04-17 ENCOUNTER — Inpatient Hospital Stay: Payer: Medicare Other

## 2018-04-17 DIAGNOSIS — C679 Malignant neoplasm of bladder, unspecified: Secondary | ICD-10-CM

## 2018-04-17 DIAGNOSIS — Z5112 Encounter for antineoplastic immunotherapy: Secondary | ICD-10-CM | POA: Diagnosis not present

## 2018-04-17 DIAGNOSIS — Z95828 Presence of other vascular implants and grafts: Secondary | ICD-10-CM

## 2018-04-17 DIAGNOSIS — C775 Secondary and unspecified malignant neoplasm of intrapelvic lymph nodes: Secondary | ICD-10-CM | POA: Diagnosis not present

## 2018-04-17 DIAGNOSIS — N189 Chronic kidney disease, unspecified: Secondary | ICD-10-CM | POA: Diagnosis not present

## 2018-04-17 DIAGNOSIS — Z79899 Other long term (current) drug therapy: Secondary | ICD-10-CM | POA: Diagnosis not present

## 2018-04-17 LAB — CMP (CANCER CENTER ONLY)
ALT: 7 U/L (ref 0–44)
AST: 10 U/L — ABNORMAL LOW (ref 15–41)
Albumin: 3.1 g/dL — ABNORMAL LOW (ref 3.5–5.0)
Alkaline Phosphatase: 69 U/L (ref 38–126)
Anion gap: 12 (ref 5–15)
BUN: 25 mg/dL — ABNORMAL HIGH (ref 6–20)
CO2: 25 mmol/L (ref 22–32)
Calcium: 9.6 mg/dL (ref 8.9–10.3)
Chloride: 102 mmol/L (ref 98–111)
Creatinine: 1.55 mg/dL — ABNORMAL HIGH (ref 0.61–1.24)
GFR, Est AFR Am: 56 mL/min — ABNORMAL LOW (ref >60)
GFR, Estimated: 48 mL/min — ABNORMAL LOW (ref >60)
Glucose, Bld: 163 mg/dL — ABNORMAL HIGH (ref 70–99)
Potassium: 4 mmol/L (ref 3.5–5.1)
Sodium: 139 mmol/L (ref 135–145)
Total Bilirubin: 0.2 mg/dL — ABNORMAL LOW (ref 0.3–1.2)
Total Protein: 8 g/dL (ref 6.5–8.1)

## 2018-04-17 LAB — CBC WITH DIFFERENTIAL (CANCER CENTER ONLY)
Abs Immature Granulocytes: 0.03 10*3/uL (ref 0.00–0.07)
Basophils Absolute: 0 10*3/uL (ref 0.0–0.1)
Basophils Relative: 1 %
Eosinophils Absolute: 0.2 10*3/uL (ref 0.0–0.5)
Eosinophils Relative: 2 %
HCT: 29.7 % — ABNORMAL LOW (ref 39.0–52.0)
Hemoglobin: 9.3 g/dL — ABNORMAL LOW (ref 13.0–17.0)
Immature Granulocytes: 0 %
Lymphocytes Relative: 10 %
Lymphs Abs: 0.7 10*3/uL (ref 0.7–4.0)
MCH: 29.2 pg (ref 26.0–34.0)
MCHC: 31.3 g/dL (ref 30.0–36.0)
MCV: 93.1 fL (ref 80.0–100.0)
Monocytes Absolute: 0.7 10*3/uL (ref 0.1–1.0)
Monocytes Relative: 9 %
Neutro Abs: 5.4 10*3/uL (ref 1.7–7.7)
Neutrophils Relative %: 78 %
Platelet Count: 243 10*3/uL (ref 150–400)
RBC: 3.19 MIL/uL — ABNORMAL LOW (ref 4.22–5.81)
RDW: 15.3 % (ref 11.5–15.5)
WBC Count: 7 10*3/uL (ref 4.0–10.5)
nRBC: 0 % (ref 0.0–0.2)

## 2018-04-17 LAB — TSH: TSH: 2.605 u[IU]/mL (ref 0.320–4.118)

## 2018-04-17 MED ORDER — SODIUM CHLORIDE 0.9% FLUSH
10.0000 mL | Freq: Once | INTRAVENOUS | Status: AC
  Administered 2018-04-17: 10:00:00 10 mL
  Filled 2018-04-17: qty 10

## 2018-04-17 MED ORDER — SODIUM CHLORIDE 0.9% FLUSH
10.0000 mL | Freq: Once | INTRAVENOUS | Status: AC
  Administered 2018-04-17: 10 mL
  Filled 2018-04-17: qty 10

## 2018-04-17 MED ORDER — HEPARIN SOD (PORK) LOCK FLUSH 100 UNIT/ML IV SOLN
500.0000 [IU] | Freq: Once | INTRAVENOUS | Status: AC
  Administered 2018-04-17: 500 [IU]
  Filled 2018-04-17: qty 5

## 2018-04-17 NOTE — Telephone Encounter (Signed)
R/s treatment per 4/10 sch message - pt aware of appt date and time

## 2018-04-17 NOTE — Progress Notes (Signed)
Pt states that he did not know he was supposed to get infusion today - he thought appt was for lab/flush only.  Says he cannot stay for infusion today d/t other appts. Per Dr Alen Blew ok to reschedule for infusion next week and no need to repeat labs. High priority msg sent to scheduling.

## 2018-04-20 ENCOUNTER — Ambulatory Visit: Payer: Medicare Other | Admitting: Cardiology

## 2018-04-21 ENCOUNTER — Other Ambulatory Visit: Payer: Self-pay

## 2018-04-21 ENCOUNTER — Emergency Department (HOSPITAL_COMMUNITY): Payer: Medicare Other

## 2018-04-21 ENCOUNTER — Encounter (HOSPITAL_COMMUNITY): Payer: Self-pay | Admitting: Emergency Medicine

## 2018-04-21 ENCOUNTER — Emergency Department (HOSPITAL_COMMUNITY)
Admission: EM | Admit: 2018-04-21 | Discharge: 2018-04-22 | Disposition: A | Discharge status: Home / Self Care | Payer: Medicare Other | Attending: Emergency Medicine | Admitting: Emergency Medicine

## 2018-04-21 DIAGNOSIS — Z7984 Long term (current) use of oral hypoglycemic drugs: Secondary | ICD-10-CM | POA: Insufficient documentation

## 2018-04-21 DIAGNOSIS — Z79899 Other long term (current) drug therapy: Secondary | ICD-10-CM | POA: Diagnosis not present

## 2018-04-21 DIAGNOSIS — Z951 Presence of aortocoronary bypass graft: Secondary | ICD-10-CM | POA: Insufficient documentation

## 2018-04-21 DIAGNOSIS — I252 Old myocardial infarction: Secondary | ICD-10-CM | POA: Insufficient documentation

## 2018-04-21 DIAGNOSIS — I129 Hypertensive chronic kidney disease with stage 1 through stage 4 chronic kidney disease, or unspecified chronic kidney disease: Secondary | ICD-10-CM | POA: Diagnosis not present

## 2018-04-21 DIAGNOSIS — R109 Unspecified abdominal pain: Secondary | ICD-10-CM | POA: Diagnosis not present

## 2018-04-21 DIAGNOSIS — I447 Left bundle-branch block, unspecified: Secondary | ICD-10-CM | POA: Diagnosis not present

## 2018-04-21 DIAGNOSIS — T83128A Displacement of other urinary devices and implants, initial encounter: Secondary | ICD-10-CM | POA: Diagnosis not present

## 2018-04-21 DIAGNOSIS — N183 Chronic kidney disease, stage 3 (moderate): Secondary | ICD-10-CM | POA: Diagnosis not present

## 2018-04-21 DIAGNOSIS — T83022A Displacement of nephrostomy catheter, initial encounter: Secondary | ICD-10-CM

## 2018-04-21 DIAGNOSIS — I2510 Atherosclerotic heart disease of native coronary artery without angina pectoris: Secondary | ICD-10-CM | POA: Insufficient documentation

## 2018-04-21 DIAGNOSIS — R1030 Lower abdominal pain, unspecified: Secondary | ICD-10-CM | POA: Diagnosis present

## 2018-04-21 DIAGNOSIS — E1122 Type 2 diabetes mellitus with diabetic chronic kidney disease: Secondary | ICD-10-CM | POA: Diagnosis not present

## 2018-04-21 DIAGNOSIS — N1330 Unspecified hydronephrosis: Secondary | ICD-10-CM

## 2018-04-21 DIAGNOSIS — T83012A Breakdown (mechanical) of nephrostomy catheter, initial encounter: Secondary | ICD-10-CM | POA: Diagnosis not present

## 2018-04-21 DIAGNOSIS — Y828 Other medical devices associated with adverse incidents: Secondary | ICD-10-CM | POA: Insufficient documentation

## 2018-04-21 LAB — COMPREHENSIVE METABOLIC PANEL
ALT: 10 U/L (ref 0–44)
AST: 11 U/L — ABNORMAL LOW (ref 15–41)
Albumin: 3.3 g/dL — ABNORMAL LOW (ref 3.5–5.0)
Alkaline Phosphatase: 67 U/L (ref 38–126)
Anion gap: 10 (ref 5–15)
BUN: 20 mg/dL (ref 6–20)
CO2: 25 mmol/L (ref 22–32)
Calcium: 9.5 mg/dL (ref 8.9–10.3)
Chloride: 103 mmol/L (ref 98–111)
Creatinine, Ser: 1.41 mg/dL — ABNORMAL HIGH (ref 0.61–1.24)
GFR calc Af Amer: >60 mL/min (ref >60)
GFR calc non Af Amer: 54 mL/min — ABNORMAL LOW (ref >60)
Glucose, Bld: 140 mg/dL — ABNORMAL HIGH (ref 70–99)
Potassium: 4 mmol/L (ref 3.5–5.1)
Sodium: 138 mmol/L (ref 135–145)
Total Bilirubin: 0.3 mg/dL (ref 0.3–1.2)
Total Protein: 8.1 g/dL (ref 6.5–8.1)

## 2018-04-21 LAB — CBC WITH DIFFERENTIAL/PLATELET
Abs Immature Granulocytes: 0.03 10*3/uL (ref 0.00–0.07)
Basophils Absolute: 0 10*3/uL (ref 0.0–0.1)
Basophils Relative: 0 %
Eosinophils Absolute: 0.1 10*3/uL (ref 0.0–0.5)
Eosinophils Relative: 2 %
HCT: 33.2 % — ABNORMAL LOW (ref 39.0–52.0)
Hemoglobin: 10.2 g/dL — ABNORMAL LOW (ref 13.0–17.0)
Immature Granulocytes: 0 %
Lymphocytes Relative: 9 %
Lymphs Abs: 0.7 10*3/uL (ref 0.7–4.0)
MCH: 28.8 pg (ref 26.0–34.0)
MCHC: 30.7 g/dL (ref 30.0–36.0)
MCV: 93.8 fL (ref 80.0–100.0)
Monocytes Absolute: 0.6 10*3/uL (ref 0.1–1.0)
Monocytes Relative: 8 %
Neutro Abs: 5.9 10*3/uL (ref 1.7–7.7)
Neutrophils Relative %: 81 %
Platelets: 264 10*3/uL (ref 150–400)
RBC: 3.54 MIL/uL — ABNORMAL LOW (ref 4.22–5.81)
RDW: 15 % (ref 11.5–15.5)
WBC: 7.3 10*3/uL (ref 4.0–10.5)
nRBC: 0 % (ref 0.0–0.2)

## 2018-04-21 LAB — URINALYSIS, ROUTINE W REFLEX MICROSCOPIC
Bilirubin Urine: NEGATIVE
Glucose, UA: NEGATIVE mg/dL
Ketones, ur: NEGATIVE mg/dL
Nitrite: POSITIVE — AB
Protein, ur: 100 mg/dL — AB
Specific Gravity, Urine: 1.01 (ref 1.005–1.030)
WBC, UA: >50 WBC/hpf — ABNORMAL HIGH (ref 0–5)
pH: 8 (ref 5.0–8.0)

## 2018-04-21 LAB — LACTIC ACID, PLASMA
Lactic Acid, Venous: 0.9 mmol/L (ref 0.5–1.9)
Lactic Acid, Venous: 0.8 mmol/L (ref 0.5–1.9)

## 2018-04-21 MED ORDER — METHENAMINE MANDELATE 0.5 G PO TABS
1000.0000 mg | ORAL_TABLET | Freq: Three times a day (TID) | ORAL | Status: DC
  Administered 2018-04-22: 1000 mg via ORAL
  Filled 2018-04-21 (×2): qty 2

## 2018-04-21 MED ORDER — SODIUM CHLORIDE 0.9 % IV SOLN
1.0000 g | Freq: Once | INTRAVENOUS | Status: AC
  Administered 2018-04-22: 1 g via INTRAVENOUS
  Filled 2018-04-21: qty 10

## 2018-04-21 MED ORDER — MORPHINE SULFATE (PF) 4 MG/ML IV SOLN
4.0000 mg | INTRAVENOUS | Status: DC | PRN
  Administered 2018-04-22 (×3): 4 mg via INTRAVENOUS
  Filled 2018-04-21 (×3): qty 1

## 2018-04-21 MED ORDER — OXYCODONE HCL 5 MG PO TABS: 10.0000 mg | ORAL_TABLET | Freq: Four times a day (QID) | ORAL | Status: DC | PRN

## 2018-04-21 MED ORDER — FENTANYL CITRATE (PF) 100 MCG/2ML IJ SOLN
100.0000 ug | Freq: Once | INTRAMUSCULAR | Status: AC
  Administered 2018-04-21: 100 ug via INTRAVENOUS
  Filled 2018-04-21: qty 2

## 2018-04-21 MED ORDER — BACLOFEN 10 MG PO TABS
20.0000 mg | ORAL_TABLET | Freq: Three times a day (TID) | ORAL | Status: DC
  Administered 2018-04-22: 20 mg via ORAL
  Filled 2018-04-21: qty 2

## 2018-04-21 MED ORDER — MORPHINE SULFATE (PF) 4 MG/ML IV SOLN
4.0000 mg | Freq: Once | INTRAVENOUS | Status: AC
  Administered 2018-04-21: 4 mg via INTRAVENOUS
  Filled 2018-04-21: qty 1

## 2018-04-21 MED ORDER — CARBAMAZEPINE 200 MG PO TABS
200.0000 mg | ORAL_TABLET | Freq: Two times a day (BID) | ORAL | Status: DC
  Administered 2018-04-22: 200 mg via ORAL
  Filled 2018-04-21: qty 1

## 2018-04-21 MED ORDER — DULOXETINE HCL 20 MG PO CPEP
20.0000 mg | ORAL_CAPSULE | Freq: Every day | ORAL | Status: DC
  Administered 2018-04-22: 20 mg via ORAL
  Filled 2018-04-21: qty 1

## 2018-04-21 MED ORDER — ROPINIROLE HCL 0.25 MG PO TABS
0.2500 mg | ORAL_TABLET | Freq: Every day | ORAL | Status: DC
  Administered 2018-04-22: 0.25 mg via ORAL
  Filled 2018-04-21: qty 1

## 2018-04-21 NOTE — ED Provider Notes (Addendum)
[facility] DEPT Provider Note   CSN: [id number] Arrival date & time: 04/21/18  1942    History   Chief Complaint Chief Complaint  Patient presents with  . Flank Pain    HPI Andrew Joyce is a 58 y.o. male.     The history is provided by the patient and medical records. No language interpreter was used.  Flank Pain    Andrew Joyce is a 58 y.o. male who presents to the Emergency Department complaining of flank pain. He presents to the emergency department complaining of flank pain that began about 2 PM today. He has a history of bilateral nephrostomy tubes and has been flushing these every evening with saline. He was able to flush them easily last evening. Around 2 PM today he developed pain in his left flank with decreased output from the left nephrostomy tube. He attempted to flush it and cannot flush it today. He has associated fever at home to 100.2. He denies any chest pain, shortness of breath, nausea, vomiting, diarrhea. Symptoms are severe and constant nature. It is similar episode about a month ago was admitted to the hospital for nephrectomy tube exchange and sepsis. Past Medical History:  Diagnosis Date  . 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 Chronic kidney disease (CKD), stage II (mild)   . Congenital birth defect   . Coronary artery disease    a. s/p CABG in 2008; b.  s/p cath in 2011 for med rx.;  c.  NSTEMI (02/2011):  LHC (02/2011):  LAD occluded, distal LAD filled via left to left collaterals, distal CFX 40-50%, proximal RCA occluded (right to right collaterals), distal RCA occluded, S-RCA occluded proximally, S-OM1/OM2 occluded (new from 2011 - culprit), L-LAD ok, ant apical HK, EF 45-50% - med Rx.  [name] Dyslipidemia   . History of renal stone   . Hx of cardiovascular stress test    Nuclear (06/2009):  EF 71%, small area of infarct/ischemia in inferior wall-unchanged from prior  . Hx of  echocardiogram    Echo (05/2013):  EF 55-60%, inf HK, mild AI, normal RVSF, RVSP 28 mmHg  . Hypertension   . Pancreatitis   . Recurrent UTI     Patient Active Problem List   Diagnosis Date Noted  . Sepsis ([place]) 03/22/2018  . Anemia 03/22/2018  . Type 2 diabetes mellitus ([place]) 03/22/2018  . Pyelonephritis 03/21/2018  . Hydronephrosis 03/21/2018  . Insomnia 02/25/2018  . Port-A-Cath in place 09/23/2017  . Malignant neoplasm of urinary bladder ([place]) 09/04/2017  . Diabetes mellitus ([place]) 08/08/2017  . Goals of care, counseling/discussion 01/21/2017  . Benign fasciculation-cramp syndrome 08/22/2015  . Chronic thoracic back pain 03/20/2015  . Fasciculation of lower extremity 11/18/2014  . Bilateral thoracic back pain 09/28/2014  . Nonallopathic lesion-rib cage 07/14/2014  . Nonallopathic lesion of thoracic region 07/14/2014  . Nonallopathic lesion of cervical region 07/14/2014  . Posture imbalance 04/13/2014  . CKD (chronic kidney disease) stage 3, GFR 30-59 ml/min (HCC) 06/01/2013  . Angina pectoris ([place]) 09/18/2011  . Dyspnea 06/13/2011  . NSTEMI (non-ST elevated myocardial infarction) ([place]) 02/27/2011  . CAD (coronary artery disease) 02/06/2011  . HTN (hypertension) 02/06/2011  . Hyperlipidemia 02/06/2011    Past Surgical History:  Procedure Laterality Date  . CARDIAC CATHETERIZATION  07/28/2009   EF 60%; Grafts patent except for SVG to the AM and PD. Native RCA is occluded.  [name] CARDIAC CATHETERIZATION  03/20/2006  EF 60-65%  . CARDIOVASCULAR STRESS TEST  06/12/2009   EF 71%, SMALL AREA OF INFARCT/ISCHEMIA IN INFERIOR WALL; FELT TO NOT BE CHANGED.  [name] CORONARY ARTERY BYPASS GRAFT  03/2006   LIMA GRAFT TO LAD, SAPHENOUS VEIN GRAFT TO THE ACUTE MARGINAL BRANCH THE RIGHT CORONARY, SAPHENOUS VEIN GRAFT TO THE PDA, SEQUENTIAL VEIN GRAFT TO THE FIRST AND SECOND OBTUSE MARGINAL VESSELS  . IR IMAGING GUIDED PORT INSERTION  09/19/2017  . IR NEPHROSTOMY EXCHANGE LEFT  03/22/2018  . IR  NEPHROSTOMY EXCHANGE RIGHT  03/22/2018  . KIDNEY SURGERY    . LEFT HEART CATHETERIZATION WITH CORONARY ANGIOGRAM N/A 02/26/2011   Procedure: LEFT HEART CATHETERIZATION WITH CORONARY ANGIOGRAM;  Surgeon: Thayer Headings, MD;  Location: [facility] CATH LAB;  Service: Cardiovascular;  Laterality: N/A;        Home Medications    Prior to Admission medications   Medication Sig Start Date End Date Taking? Authorizing Provider  allopurinol (ZYLOPRIM) 100 MG tablet Take 1 tablet (100 mg total) by mouth daily. 08/08/16  Yes Lyndal Pulley, DO  amLODipine (NORVASC) 5 MG tablet Take 1 tablet (5 mg total) by mouth daily. 09/17/13  Yes [place], Peter M, MD  baclofen (LIORESAL) 10 MG tablet TAKE 2 TABLETS BY MOUTH THREE TIMES DAILY Patient taking differently: Take 20 mg by mouth 3 (three) times daily.  02/03/18  Yes Patel, Donika K, DO  carbamazepine (TEGRETOL) 200 MG tablet TAKE 1 TABLET BY MOUTH TWICE DAILY Patient taking differently: Take 200 mg by mouth 2 (two) times daily.  08/18/17  Yes Patel, Donika K, DO  DULoxetine (CYMBALTA) 20 MG capsule Take 20 mg by mouth at bedtime. For Sleep Aide 09/17/17  Yes [provider]  fenofibrate 160 MG tablet TAKE 1 TABLET BY MOUTH ONCE DAILY Patient taking differently: Take 160 mg by mouth daily.  10/14/17  Yes [place], Peter M, MD  isosorbide mononitrate (IMDUR) 60 MG 24 hr tablet TAKE 1 & 1/2 (ONE & ONE-HALF) TABLETS BY MOUTH ONCE DAILY Patient taking differently: Take 60 mg by mouth daily.  06/11/17  Yes [place], Peter M, MD  JANUVIA 50 MG tablet TAKE 1 TABLET BY MOUTH ONCE DAILY 01/13/18  Yes [name], NP  levothyroxine (SYNTHROID, LEVOTHROID) 25 MCG tablet Take 1 tablet (25 mcg total) by mouth daily. 08/08/16  Yes Lyndal Pulley, DO  meloxicam (MOBIC) 15 MG tablet Take 1 tablet (15 mg total) by mouth daily. 10/29/16  Yes Gardiner Barefoot, DPM  methenamine (MANDELAMINE) 1 g tablet Take 1,000 mg by mouth 3 (three) times daily.  12/21/15  Yes [provider]  Oxycodone HCl 10 MG TABS Take 10 mg by mouth every 6 (six) hours as needed (pain).  12/08/17  Yes [provider]  rOPINIRole (REQUIP) 0.25 MG tablet TAKE 1 TABLET BY MOUTH 3 HOURS BEFORE BEDTIME Patient taking differently: Take 0.25 mg by mouth at bedtime. TAKE 1 TABLET BY MOUTH 3 HOURS BEFORE BEDTIME 11/03/17  Yes Patel, Donika K, DO  rosuvastatin (CRESTOR) 40 MG tablet Take 1 tablet (40 mg total) by mouth daily. 03/02/18 05/31/18 Yes [place], Peter M, MD  fentaNYL (DURAGESIC - DOSED MCG/HR) 12 MCG/HR Place 1 patch onto the skin every 3 (three) days.  12/08/17   [provider]  gabapentin (NEURONTIN) 300 MG capsule Take 300 mg by mouth at bedtime as needed for pain. 03/11/18   [provider]  glucose blood (ACCU-CHEK GUIDE) test strip Use to check blood sugars 2 times daily. Dx  Code: E11.9 08/11/17   [name], NP  lidocaine-prilocaine (EMLA) cream Apply 1 application topically as needed. 09/04/17   Wyatt Portela, MD  nitroGLYCERIN (NITROSTAT) 0.4 MG SL tablet Place 1 tablet (0.4 mg total) under the tongue every 5 (five) minutes as needed. For chest pain 06/20/16   [place], Peter M, MD  sodium chloride flush 0.9 % SOLN injection Flush 10 mL of saline into each nephrostomy tube daily. 11/28/17   [provider]    Family History Family History  Problem Relation Age of Onset  . Hyperlipidemia Mother        Living, 8  . Hypertension Mother   . Hyperlipidemia Father        Living 52  . Hypertension Father   . Healthy Sister   . Healthy Brother     Social History Social History   Tobacco Use  . Smoking status: Never Smoker  . Smokeless tobacco: Never Used  Substance Use Topics  . Alcohol use: No    Alcohol/week: 0.0 standard drinks  . Drug use: No     Allergies   Patient has no known allergies.   Review of Systems Review of Systems  Genitourinary: Positive for flank pain.  All other systems reviewed and are negative.     Physical Exam Updated Vital Signs BP 135/88   Pulse 91   Temp 99 F (37.2 C)   Resp 14   Ht 5\' 10"  (1.778 m)   Wt 64 kg   SpO2 100%   BMI 20.23 kg/m   Physical Exam Vitals signs and nursing note reviewed.  Constitutional:      Appearance: He is well-developed.  HENT:     Head: Normocephalic and atraumatic.  Cardiovascular:     Rate and Rhythm: Normal rate and regular rhythm.  Pulmonary:     Effort: Pulmonary effort is normal. No respiratory distress.  Abdominal:     Tenderness: There is abdominal tenderness. There is no guarding or rebound.     Comments: Left CVA tenderness. Bilateral nephrostomy tubes in placed with dressings c/d/i  Musculoskeletal:        General: No swelling or tenderness.  Skin:    General: Skin is warm and dry.     Capillary Refill: Capillary refill takes less than 2 seconds.  Neurological:     Mental Status: He is alert and oriented to person, place, and time.  Psychiatric:        Mood and Affect: Mood normal.        Behavior: Behavior normal.      ED Treatments / Results  Labs (all labs ordered are listed, but only abnormal results are displayed) Labs Reviewed  COMPREHENSIVE METABOLIC PANEL - Abnormal; Notable for the following components:      Result Value   Glucose, Bld 140 (*)    Creatinine, Ser 1.41 (*)    Albumin 3.3 (*)    AST 11 (*)    GFR calc non Af Amer 54 (*)    All other components within normal limits  CBC WITH DIFFERENTIAL/PLATELET - Abnormal; Notable for the following components:   RBC 3.54 (*)    Hemoglobin 10.2 (*)    HCT 33.2 (*)    All other components within normal limits  URINALYSIS, ROUTINE W REFLEX MICROSCOPIC - Abnormal; Notable for the following components:   APPearance CLOUDY (*)    Hgb urine dipstick SMALL (*)    Protein, ur 100 (*)    Nitrite POSITIVE (*)  Leukocytes,Ua LARGE (*)    WBC, UA >50 (*)    Bacteria, UA RARE (*)    All other components within normal limits  CULTURE, BLOOD (ROUTINE X  2)  CULTURE, BLOOD (ROUTINE X 2)  URINE CULTURE  LACTIC ACID, PLASMA  LACTIC ACID, PLASMA    EKG None  Radiology Dg Abdomen 1 View  Result Date: 04/21/2018 CLINICAL DATA:  Decreased output from left nephrostomy catheter EXAM: ABDOMEN - 1 VIEW COMPARISON:  CT from earlier in the same day. FINDINGS: Bilateral nephrostomy catheters are again identified similar to that seen on prior CT examination. Scattered large and small bowel gas is noted. Widening of the pubic symphysis is again noted. Mild degenerative changes of lumbar spine are seen. Scattered tiny left renal calculi are again seen. IMPRESSION: Stable appearing nephrostomy catheters bilaterally. Tiny nonobstructing left renal calculi. No other focal abnormality is seen. Electronically Signed   By: Inez Catalina M.D.   On: 04/21/2018 23:04   Ct Renal Stone Study  Result Date: 04/21/2018 CLINICAL DATA:  58 y/o M; left flank pain and decreased left nephrostomy tube output. History of bladder tumor post radiation. History of bladder exstrophy. EXAM: CT ABDOMEN AND PELVIS WITHOUT CONTRAST TECHNIQUE: Multidetector CT imaging of the abdomen and pelvis was performed following the standard protocol without IV contrast. COMPARISON:  None. FINDINGS: Lower chest: No acute abnormality. Hepatobiliary: No focal liver abnormality is seen. No gallstones, gallbladder wall thickening, or biliary dilatation. Pancreas: Unremarkable. No pancreatic ductal dilatation or surrounding inflammatory changes. Spleen: Normal in size without focal abnormality. Adrenals/Urinary Tract: Normal adrenal glands. Right-sided percutaneous nephrostomy tube is stable in position coiling within the renal pelvis. The right renal collecting system is decompressed when compared with the prior CT of abdomen and pelvis. Stable exophytic cyst arising from the upper pole of the right kidney. The left-sided percutaneous nephrostomy tube as slightly migrated downstream and is coiling within the  proximal ureter just beyond the ureteropelvic junction. There is mild-to-moderate left-sided pelvicaliectasis which is mildly increased when compared with prior CT of abdomen and pelvis. Stable small nonobstructing stones within the lower pole of left kidney. There is increased wall thickening of the dilated left mid and lower ureter as well as mild surrounding fat stranding. Bladder exstrophy. Bladder mass invading the surrounding anterior abdominal wall with cutaneous fistula again noted, grossly stable in comparison with prior studies, suboptimal assessment given absence of intravenous contrast. Air and fluid-filled pockets within the mass may reflect necrosis and/or infection. Stomach/Bowel: Stomach is within normal limits. Appendix appears normal. No evidence of bowel wall thickening, distention, or inflammatory changes. Vascular/Lymphatic: Increased size of left external iliac nodes measuring up to 13 x 15 mm (series 2, image 69). Reproductive: Stable prostate and seminal vesicles. Other: No abdominal wall hernia or abnormality. No abdominopelvic ascites. Musculoskeletal: Diastasis of pubic symphysis, likely on congenital basis and related to history of bladder exstrophy. No acute osseous abnormality is evident. IMPRESSION: 1. Left-sided percutaneous nephrostomy tube has migrated downstream and is coiled within the proximal ureter just beyond the ureteropelvic junction. Increased mild-to-moderate left-sided pelvicaliectasis. 2. Stable right percutaneous nephrostomy tube and interval decompression of the right renal collecting system. 3. Increased wall thickening of the dilated left mid and lower ureter with surrounding fat stranding, suspected pyelitis. 4. Bladder exstrophy and bladder mass invading the surrounding abdominal wall is grossly stable. Persistent pockets of air and fluid within the mass may represent infection or necrosis. 5. New left external iliac lymphadenopathy measuring up to 13 mm short  axis. Electronically Signed   By: Kristine Garbe M.D.   On: 04/21/2018 21:17    Procedures Procedures (including critical care time)  Medications Ordered in ED Medications  baclofen (LIORESAL) tablet 20 mg (has no administration in time range)  carbamazepine (TEGRETOL) tablet 200 mg (has no administration in time range)  DULoxetine (CYMBALTA) DR capsule 20 mg (has no administration in time range)  oxyCODONE (Oxy IR/ROXICODONE) immediate release tablet 10 mg (has no administration in time range)  rOPINIRole (REQUIP) tablet 0.25 mg (has no administration in time range)  methenamine (MANDELAMINE) tablet 1,000 mg (has no administration in time range)  morphine 4 MG/ML injection 4 mg (has no administration in time range)  cefTRIAXone (ROCEPHIN) 1 g in sodium chloride 0.9 % 100 mL IVPB (has no administration in time range)  fentaNYL (SUBLIMAZE) injection 100 mcg (100 mcg Intravenous Given 04/21/18 2022)  morphine 4 MG/ML injection 4 mg (4 mg Intravenous Given 04/21/18 2212)     Initial Impression / Assessment and Plan / ED Course  I have reviewed the triage vital signs and the nursing notes.  Pertinent labs & imaging results that were available during my care of the patient were reviewed by me and considered in my medical decision making (see chart for details).  Clinical Course as of Apr 22 1215  Wed Apr 22, 2018  0838 Patient being taken up to IR.   [MB]  4356 Patient said they had no problem with exchanging his tube.  He is ready for discharge.   [MB]  1016 IMPRESSION: 1. Completely occluded left-sided 10 French percutaneous nephrostomy tube secondary to dense internal mineralization which could not be crossed with a wire. 2. Successful rescue with up size to 12 [place] and changed to a smaller pigtail Bettey Mare style drainage catheter. 3. Will schedule patient for return to IR for tube check and exchange in 4 weeks in an effort to prevent repeated occlusion.      [MB]    Clinical Course User Index [MB] Hayden Rasmussen, MD       Patient here with flank pain, left nephrostomy tube is not draining properly.  CT demonstrates advancement of the tube.  D/w Dr. Barbie Banner with IR.  Attempted to retract the tube 3cm.  After retraction unable to flush with saline.  Repeat film shows similar positioning.  D/w Dr. Barbie Banner - plan to replace in am.  Pt care transferred pending tube replacement.  Pt updated of plan and he is in agreement.    Current clinical picture is not c/w sepsis.    Final Clinical Impressions(s) / ED Diagnoses   Final diagnoses:  Nephrostomy tube displaced [facility])  Flank pain    ED Discharge Orders    None       Quintella Reichert, MD 04/21/18 2352    Quintella Reichert, MD 04/22/18 1217

## 2018-04-21 NOTE — ED Triage Notes (Signed)
Patient reports decreased output from left nephrostomy tube since this afternoon. Reports left flank pain. States tumor in bladder. Completed radiation. Chemo scheduled to begin tomorrow.

## 2018-04-22 ENCOUNTER — Encounter (HOSPITAL_COMMUNITY): Payer: Self-pay | Admitting: Interventional Radiology

## 2018-04-22 ENCOUNTER — Telehealth: Payer: Self-pay

## 2018-04-22 ENCOUNTER — Inpatient Hospital Stay: Payer: Medicare Other

## 2018-04-22 ENCOUNTER — Emergency Department (HOSPITAL_COMMUNITY): Payer: Medicare Other

## 2018-04-22 DIAGNOSIS — T83012A Breakdown (mechanical) of nephrostomy catheter, initial encounter: Secondary | ICD-10-CM | POA: Diagnosis not present

## 2018-04-22 HISTORY — PX: IR NEPHROSTOMY TUBE CHANGE: IMG1442

## 2018-04-22 LAB — URINE CULTURE

## 2018-04-22 MED ORDER — LIDOCAINE HCL 1 % IJ SOLN
INTRAMUSCULAR | Status: AC
  Filled 2018-04-22: qty 20

## 2018-04-22 MED ORDER — IOHEXOL 300 MG/ML  SOLN
25.0000 mL | Freq: Once | INTRAMUSCULAR | Status: AC | PRN
  Administered 2018-04-22: 15 mL

## 2018-04-22 NOTE — ED Notes (Signed)
Pt transported to endoscopy 

## 2018-04-22 NOTE — ED Notes (Signed)
Pt returned from IR.

## 2018-04-22 NOTE — ED Provider Notes (Signed)
Signout from Dr. Roxanne Mins.  58 year old male with nephrostomy tube here with flank pain and found to have a malpositioned nephrostomy tube. Physical Exam  BP (!) 146/92   Pulse 75   Temp 99 F (37.2 C)   Resp 16   Ht 5\' 10"  (1.778 m)   Wt 64 kg   SpO2 96%   BMI 20.23 kg/m   Physical Exam  ED Course/Procedures   Clinical Course as of Apr 22 1527  Wed Apr 22, 2018  0838 Patient being taken up to IR.   [MB]  3343 Patient said they had no problem with exchanging his tube.  He is ready for discharge.   [MB]  1016 IMPRESSION: 1. Completely occluded left-sided 10 French percutaneous nephrostomy tube secondary to dense internal mineralization which could not be crossed with a wire. 2. Successful rescue with up size to 12 [place] and changed to a smaller pigtail Bettey Mare style drainage catheter. 3. Will schedule patient for return to IR for tube check and exchange in 4 weeks in an effort to prevent repeated occlusion.     [MB]    Clinical Course User Index [MB] Hayden Rasmussen, MD    Procedures  MDM  Plan is for interventional radiology to reposition or replace the tube.  Anticipate discharge after procedure.       Hayden Rasmussen, MD 04/22/18 [phone]

## 2018-04-22 NOTE — Telephone Encounter (Signed)
Received call from Infusion RN that patient has missed infusion appointment and appears to be in the ED. Contacted patient spouse and communicated that the infusion appointment can be changed to Thursday 4/16 at 3pm and she was appreciative of this. Scheduling message sent and spouse stated that she would make the patient aware of the change and will call with any other questions or concerns.

## 2018-04-22 NOTE — ED Provider Notes (Signed)
58 year old male needs nephrostomy tube to be replaced, care assumed from [place].  He was observed overnight in the ED without incident.  He is to go to interventional radiology for replacement of nephrostomy tube.   Delora Fuel, MD 41/28/20 [phone]

## 2018-04-23 ENCOUNTER — Inpatient Hospital Stay: Payer: Medicare Other

## 2018-04-23 ENCOUNTER — Other Ambulatory Visit: Payer: Self-pay

## 2018-04-23 ENCOUNTER — Ambulatory Visit: Payer: Medicare Other

## 2018-04-23 VITALS — BP 113/66 | HR 93 | Resp 18

## 2018-04-23 DIAGNOSIS — N189 Chronic kidney disease, unspecified: Secondary | ICD-10-CM | POA: Diagnosis not present

## 2018-04-23 DIAGNOSIS — Z79899 Other long term (current) drug therapy: Secondary | ICD-10-CM | POA: Diagnosis not present

## 2018-04-23 DIAGNOSIS — C679 Malignant neoplasm of bladder, unspecified: Secondary | ICD-10-CM | POA: Diagnosis not present

## 2018-04-23 DIAGNOSIS — C775 Secondary and unspecified malignant neoplasm of intrapelvic lymph nodes: Secondary | ICD-10-CM | POA: Diagnosis not present

## 2018-04-23 DIAGNOSIS — Z5112 Encounter for antineoplastic immunotherapy: Secondary | ICD-10-CM | POA: Diagnosis not present

## 2018-04-23 MED ORDER — SODIUM CHLORIDE 0.9% FLUSH
10.0000 mL | INTRAVENOUS | Status: DC | PRN
  Administered 2018-04-23: 10 mL
  Filled 2018-04-23: qty 10

## 2018-04-23 MED ORDER — SODIUM CHLORIDE 0.9 % IV SOLN
200.0000 mg | Freq: Once | INTRAVENOUS | Status: AC
  Administered 2018-04-23: 200 mg via INTRAVENOUS
  Filled 2018-04-23: qty 8

## 2018-04-23 MED ORDER — SODIUM CHLORIDE 0.9 % IV SOLN
Freq: Once | INTRAVENOUS | Status: AC
  Administered 2018-04-23: 15:00:00 via INTRAVENOUS
  Filled 2018-04-23: qty 250

## 2018-04-23 MED ORDER — HEPARIN SOD (PORK) LOCK FLUSH 100 UNIT/ML IV SOLN
500.0000 [IU] | Freq: Once | INTRAVENOUS | Status: AC | PRN
  Administered 2018-04-23: 16:00:00 500 [IU]
  Filled 2018-04-23: qty 5

## 2018-04-23 NOTE — Patient Instructions (Signed)
Pembrolizumab injection  What is this medicine?  PEMBROLIZUMAB (pem broe liz ue mab) is a monoclonal antibody. It is used to treat cervical cancer, esophageal cancer, head and neck cancer, hepatocellular cancer, Hodgkin lymphoma, kidney cancer, lymphoma, melanoma, Merkel cell carcinoma, lung cancer, stomach cancer, urothelial cancer, and cancers that have a certain genetic condition.  This medicine may be used for other purposes; ask your health care provider or pharmacist if you have questions.  COMMON BRAND NAME(S): Keytruda  What should I tell my health care provider before I take this medicine?  They need to know if you have any of these conditions:  -diabetes  -immune system problems  -inflammatory bowel disease  -liver disease  -lung or breathing disease  -lupus  -received or scheduled to receive an organ transplant or a stem-cell transplant that uses donor stem cells  -an unusual or allergic reaction to pembrolizumab, other medicines, foods, dyes, or preservatives  -pregnant or trying to get pregnant  -breast-feeding  How should I use this medicine?  This medicine is for infusion into a vein. It is given by a health care professional in a hospital or clinic setting.  A special MedGuide will be given to you before each treatment. Be sure to read this information carefully each time.  Talk to your pediatrician regarding the use of this medicine in children. While this drug may be prescribed for selected conditions, precautions do apply.  Overdosage: If you think you have taken too much of this medicine contact a poison control center or emergency room at once.  NOTE: This medicine is only for you. Do not share this medicine with others.  What if I miss a dose?  It is important not to miss your dose. Call your doctor or health care professional if you are unable to keep an appointment.  What may interact with this medicine?  Interactions have not been studied.  Give your health care provider a list of all the  medicines, herbs, non-prescription drugs, or dietary supplements you use. Also tell them if you smoke, drink alcohol, or use illegal drugs. Some items may interact with your medicine.  This list may not describe all possible interactions. Give your health care provider a list of all the medicines, herbs, non-prescription drugs, or dietary supplements you use. Also tell them if you smoke, drink alcohol, or use illegal drugs. Some items may interact with your medicine.  What should I watch for while using this medicine?  Your condition will be monitored carefully while you are receiving this medicine.  You may need blood work done while you are taking this medicine.  Do not become pregnant while taking this medicine or for 4 months after stopping it. Women should inform their doctor if they wish to become pregnant or think they might be pregnant. There is a potential for serious side effects to an unborn child. Talk to your health care professional or pharmacist for more information. Do not breast-feed an infant while taking this medicine or for 4 months after the last dose.  What side effects may I notice from receiving this medicine?  Side effects that you should report to your doctor or health care professional as soon as possible:  -allergic reactions like skin rash, itching or hives, swelling of the face, lips, or tongue  -bloody or black, tarry  -breathing problems  -changes in vision  -chest pain  -chills  -confusion  -constipation  -cough  -diarrhea  -dizziness or feeling faint or lightheaded  -  fast or irregular heartbeat  -fever  -flushing  -hair loss  -joint pain  -low blood counts - this medicine may decrease the number of white blood cells, red blood cells and platelets. You may be at increased risk for infections and bleeding.  -muscle pain  -muscle weakness  -persistent headache  -redness, blistering, peeling or loosening of the skin, including inside the mouth  -signs and symptoms of high blood sugar  such as dizziness; dry mouth; dry skin; fruity breath; nausea; stomach pain; increased hunger or thirst; increased urination  -signs and symptoms of kidney injury like trouble passing urine or change in the amount of urine  -signs and symptoms of liver injury like dark urine, light-colored stools, loss of appetite, nausea, right upper belly pain, yellowing of the eyes or skin  -sweating  -swollen lymph nodes  -weight loss  Side effects that usually do not require medical attention (report to your doctor or health care professional if they continue or are bothersome):  -decreased appetite  -muscle pain  -tiredness  This list may not describe all possible side effects. Call your doctor for medical advice about side effects. You may report side effects to FDA at 1-800-FDA-1088.  Where should I keep my medicine?  This drug is given in a hospital or clinic and will not be stored at home.  NOTE: This sheet is a summary. It may not cover all possible information. If you have questions about this medicine, talk to your doctor, pharmacist, or health care provider.   2019 Elsevier/Gold Standard (2017-08-07 15:06:10)

## 2018-04-24 ENCOUNTER — Telehealth: Payer: Self-pay

## 2018-04-24 NOTE — Telephone Encounter (Signed)
Contacted patient to follow up after 1st time Keytruda. Patient stated that he has felt felt and has no new concerns related to the [facility] but stated that he is constipated and has not had a BM in 8 days. Recommended he start a daily regimen of Senna-S and have Miralax on hand for prn use if he has not had a BM in 3 days. AT this point recommended that he try Mag citrate 1/2 bottle and if not effective to drink the other half. Explained that if he starts having abdominal pain or symptoms worsen he may need to go to the ED otherwise to call back on Monday if still no BM. Patient verbalized understanding and agreeable with plan.

## 2018-04-24 NOTE — Telephone Encounter (Signed)
-----   Message from Lillia Corporal sent at 04/23/2018  3:42 PM EDT ----- Regarding: Andrew Joyce first time chemo First time [facility], has had prior chemo. Tolerated well. Dr. Alen Joyce

## 2018-04-26 LAB — CULTURE, BLOOD (ROUTINE X 2)
Culture: NO GROWTH
Culture: NO GROWTH
Special Requests: ADEQUATE
Special Requests: ADEQUATE

## 2018-05-04 ENCOUNTER — Other Ambulatory Visit (HOSPITAL_COMMUNITY): Payer: Self-pay | Admitting: Interventional Radiology

## 2018-05-04 DIAGNOSIS — N1339 Other hydronephrosis: Secondary | ICD-10-CM

## 2018-05-05 ENCOUNTER — Telehealth: Payer: Self-pay | Admitting: Neurology

## 2018-05-05 ENCOUNTER — Other Ambulatory Visit: Payer: Self-pay | Admitting: Family

## 2018-05-05 DIAGNOSIS — E1129 Type 2 diabetes mellitus with other diabetic kidney complication: Secondary | ICD-10-CM

## 2018-05-05 MED ORDER — SITAGLIPTIN PHOSPHATE 50 MG PO TABS: 50.0000 mg | ORAL_TABLET | Freq: Every day | ORAL | 0 refills | Status: AC

## 2018-05-08 ENCOUNTER — Emergency Department (HOSPITAL_COMMUNITY)
Admission: EM | Admit: 2018-05-08 | Discharge: 2018-05-08 | Disposition: A | Discharge status: Home / Self Care | Payer: Medicare Other | Attending: Emergency Medicine | Admitting: Emergency Medicine

## 2018-05-08 ENCOUNTER — Other Ambulatory Visit: Payer: Self-pay

## 2018-05-08 ENCOUNTER — Inpatient Hospital Stay: Payer: Medicare Other

## 2018-05-08 ENCOUNTER — Encounter (HOSPITAL_COMMUNITY): Payer: Self-pay

## 2018-05-08 ENCOUNTER — Telehealth: Payer: Self-pay | Admitting: Oncology

## 2018-05-08 ENCOUNTER — Emergency Department (HOSPITAL_COMMUNITY): Payer: Medicare Other

## 2018-05-08 ENCOUNTER — Inpatient Hospital Stay (HOSPITAL_BASED_OUTPATIENT_CLINIC_OR_DEPARTMENT_OTHER): Payer: Medicare Other | Admitting: Oncology

## 2018-05-08 ENCOUNTER — Inpatient Hospital Stay: Payer: Medicare Other | Attending: Oncology

## 2018-05-08 VITALS — BP 108/77 | HR 89 | Temp 98.5°F | Resp 18 | Ht 70.0 in | Wt 136.9 lb

## 2018-05-08 DIAGNOSIS — E1122 Type 2 diabetes mellitus with diabetic chronic kidney disease: Secondary | ICD-10-CM | POA: Insufficient documentation

## 2018-05-08 DIAGNOSIS — Z95828 Presence of other vascular implants and grafts: Secondary | ICD-10-CM

## 2018-05-08 DIAGNOSIS — I2510 Atherosclerotic heart disease of native coronary artery without angina pectoris: Secondary | ICD-10-CM | POA: Insufficient documentation

## 2018-05-08 DIAGNOSIS — N183 Chronic kidney disease, stage 3 (moderate): Secondary | ICD-10-CM | POA: Insufficient documentation

## 2018-05-08 DIAGNOSIS — Y828 Other medical devices associated with adverse incidents: Secondary | ICD-10-CM | POA: Diagnosis not present

## 2018-05-08 DIAGNOSIS — C679 Malignant neoplasm of bladder, unspecified: Secondary | ICD-10-CM

## 2018-05-08 DIAGNOSIS — N189 Chronic kidney disease, unspecified: Secondary | ICD-10-CM | POA: Diagnosis not present

## 2018-05-08 DIAGNOSIS — R109 Unspecified abdominal pain: Secondary | ICD-10-CM | POA: Diagnosis not present

## 2018-05-08 DIAGNOSIS — Z8551 Personal history of malignant neoplasm of bladder: Secondary | ICD-10-CM | POA: Insufficient documentation

## 2018-05-08 DIAGNOSIS — I129 Hypertensive chronic kidney disease with stage 1 through stage 4 chronic kidney disease, or unspecified chronic kidney disease: Secondary | ICD-10-CM | POA: Diagnosis not present

## 2018-05-08 DIAGNOSIS — C775 Secondary and unspecified malignant neoplasm of intrapelvic lymph nodes: Secondary | ICD-10-CM

## 2018-05-08 DIAGNOSIS — T83092A Other mechanical complication of nephrostomy catheter, initial encounter: Secondary | ICD-10-CM

## 2018-05-08 DIAGNOSIS — Z951 Presence of aortocoronary bypass graft: Secondary | ICD-10-CM | POA: Diagnosis not present

## 2018-05-08 DIAGNOSIS — G893 Neoplasm related pain (acute) (chronic): Secondary | ICD-10-CM | POA: Diagnosis not present

## 2018-05-08 DIAGNOSIS — Z7189 Other specified counseling: Secondary | ICD-10-CM

## 2018-05-08 DIAGNOSIS — N132 Hydronephrosis with renal and ureteral calculous obstruction: Secondary | ICD-10-CM

## 2018-05-08 DIAGNOSIS — D6481 Anemia due to antineoplastic chemotherapy: Secondary | ICD-10-CM

## 2018-05-08 DIAGNOSIS — D630 Anemia in neoplastic disease: Secondary | ICD-10-CM

## 2018-05-08 DIAGNOSIS — Z79899 Other long term (current) drug therapy: Secondary | ICD-10-CM | POA: Diagnosis not present

## 2018-05-08 HISTORY — PX: IR NEPHROSTOMY EXCHANGE LEFT: IMG6069

## 2018-05-08 LAB — CBC WITH DIFFERENTIAL/PLATELET
Abs Immature Granulocytes: 0.01 10*3/uL (ref 0.00–0.07)
Basophils Absolute: 0 10*3/uL (ref 0.0–0.1)
Basophils Relative: 1 %
Eosinophils Absolute: 0.2 10*3/uL (ref 0.0–0.5)
Eosinophils Relative: 3 %
HCT: 29.3 % — ABNORMAL LOW (ref 39.0–52.0)
Hemoglobin: 9 g/dL — ABNORMAL LOW (ref 13.0–17.0)
Immature Granulocytes: 0 %
Lymphocytes Relative: 10 %
Lymphs Abs: 0.6 10*3/uL — ABNORMAL LOW (ref 0.7–4.0)
MCH: 29.2 pg (ref 26.0–34.0)
MCHC: 30.7 g/dL (ref 30.0–36.0)
MCV: 95.1 fL (ref 80.0–100.0)
Monocytes Absolute: 0.6 10*3/uL (ref 0.1–1.0)
Monocytes Relative: 11 %
Neutro Abs: 4.1 10*3/uL (ref 1.7–7.7)
Neutrophils Relative %: 75 %
Platelets: 242 10*3/uL (ref 150–400)
RBC: 3.08 MIL/uL — ABNORMAL LOW (ref 4.22–5.81)
RDW: 14.6 % (ref 11.5–15.5)
WBC: 5.5 10*3/uL (ref 4.0–10.5)
nRBC: 0 % (ref 0.0–0.2)

## 2018-05-08 LAB — CMP (CANCER CENTER ONLY)
ALT: <6 U/L (ref 0–44)
AST: 9 U/L — ABNORMAL LOW (ref 15–41)
Albumin: 2.8 g/dL — ABNORMAL LOW (ref 3.5–5.0)
Alkaline Phosphatase: 67 U/L (ref 38–126)
Anion gap: 11 (ref 5–15)
BUN: 22 mg/dL — ABNORMAL HIGH (ref 6–20)
CO2: 26 mmol/L (ref 22–32)
Calcium: 9.4 mg/dL (ref 8.9–10.3)
Chloride: 102 mmol/L (ref 98–111)
Creatinine: 1.58 mg/dL — ABNORMAL HIGH (ref 0.61–1.24)
GFR, Est AFR Am: 55 mL/min — ABNORMAL LOW (ref >60)
GFR, Estimated: 47 mL/min — ABNORMAL LOW (ref >60)
Glucose, Bld: 188 mg/dL — ABNORMAL HIGH (ref 70–99)
Potassium: 4 mmol/L (ref 3.5–5.1)
Sodium: 139 mmol/L (ref 135–145)
Total Bilirubin: 0.2 mg/dL — ABNORMAL LOW (ref 0.3–1.2)
Total Protein: 7.5 g/dL (ref 6.5–8.1)

## 2018-05-08 LAB — COMPREHENSIVE METABOLIC PANEL
ALT: 8 U/L (ref 0–44)
AST: 13 U/L — ABNORMAL LOW (ref 15–41)
Albumin: 3.2 g/dL — ABNORMAL LOW (ref 3.5–5.0)
Alkaline Phosphatase: 63 U/L (ref 38–126)
Anion gap: 9 (ref 5–15)
BUN: 26 mg/dL — ABNORMAL HIGH (ref 6–20)
CO2: 29 mmol/L (ref 22–32)
Calcium: 9.4 mg/dL (ref 8.9–10.3)
Chloride: 100 mmol/L (ref 98–111)
Creatinine, Ser: 1.54 mg/dL — ABNORMAL HIGH (ref 0.61–1.24)
GFR calc Af Amer: 56 mL/min — ABNORMAL LOW (ref >60)
GFR calc non Af Amer: 49 mL/min — ABNORMAL LOW (ref >60)
Glucose, Bld: 142 mg/dL — ABNORMAL HIGH (ref 70–99)
Potassium: 3.9 mmol/L (ref 3.5–5.1)
Sodium: 138 mmol/L (ref 135–145)
Total Bilirubin: 0.2 mg/dL — ABNORMAL LOW (ref 0.3–1.2)
Total Protein: 7.7 g/dL (ref 6.5–8.1)

## 2018-05-08 LAB — CBC WITH DIFFERENTIAL (CANCER CENTER ONLY)
Abs Immature Granulocytes: 0.02 10*3/uL (ref 0.00–0.07)
Basophils Absolute: 0 10*3/uL (ref 0.0–0.1)
Basophils Relative: 1 %
Eosinophils Absolute: 0.2 10*3/uL (ref 0.0–0.5)
Eosinophils Relative: 3 %
HCT: 28.5 % — ABNORMAL LOW (ref 39.0–52.0)
Hemoglobin: 8.8 g/dL — ABNORMAL LOW (ref 13.0–17.0)
Immature Granulocytes: 0 %
Lymphocytes Relative: 9 %
Lymphs Abs: 0.6 10*3/uL — ABNORMAL LOW (ref 0.7–4.0)
MCH: 28.5 pg (ref 26.0–34.0)
MCHC: 30.9 g/dL (ref 30.0–36.0)
MCV: 92.2 fL (ref 80.0–100.0)
Monocytes Absolute: 0.6 10*3/uL (ref 0.1–1.0)
Monocytes Relative: 9 %
Neutro Abs: 5 10*3/uL (ref 1.7–7.7)
Neutrophils Relative %: 78 %
Platelet Count: 238 10*3/uL (ref 150–400)
RBC: 3.09 MIL/uL — ABNORMAL LOW (ref 4.22–5.81)
RDW: 14.6 % (ref 11.5–15.5)
WBC Count: 6.4 10*3/uL (ref 4.0–10.5)
nRBC: 0 % (ref 0.0–0.2)

## 2018-05-08 LAB — URINALYSIS, ROUTINE W REFLEX MICROSCOPIC
Bilirubin Urine: NEGATIVE
Glucose, UA: NEGATIVE mg/dL
Hgb urine dipstick: NEGATIVE
Ketones, ur: NEGATIVE mg/dL
Nitrite: NEGATIVE
Protein, ur: 30 mg/dL — AB
Specific Gravity, Urine: 1.013 (ref 1.005–1.030)
WBC, UA: >50 WBC/hpf — ABNORMAL HIGH (ref 0–5)
pH: 9 — ABNORMAL HIGH (ref 5.0–8.0)

## 2018-05-08 LAB — LIPASE, BLOOD: Lipase: 25 U/L (ref 11–51)

## 2018-05-08 MED ORDER — IOHEXOL 300 MG/ML  SOLN
50.0000 mL | Freq: Once | INTRAMUSCULAR | Status: AC | PRN
  Administered 2018-05-08: 10 mL

## 2018-05-08 MED ORDER — LIDOCAINE HCL 1 % IJ SOLN
INTRAMUSCULAR | Status: AC
  Filled 2018-05-08: qty 20

## 2018-05-08 MED ORDER — SODIUM CHLORIDE 0.9% FLUSH
10.0000 mL | Freq: Once | INTRAVENOUS | Status: AC
  Administered 2018-05-08: 09:00:00 10 mL via INTRAVENOUS
  Filled 2018-05-08: qty 10

## 2018-05-08 MED ORDER — SODIUM CHLORIDE 0.9% FLUSH
10.0000 mL | Freq: Once | INTRAVENOUS | Status: AC
  Administered 2018-05-08: 10 mL
  Filled 2018-05-08: qty 10

## 2018-05-08 MED ORDER — HEPARIN SOD (PORK) LOCK FLUSH 100 UNIT/ML IV SOLN
500.0000 [IU] | Freq: Once | INTRAVENOUS | Status: AC
  Administered 2018-05-08: 09:00:00 500 [IU] via INTRAVENOUS
  Filled 2018-05-08: qty 5

## 2018-05-08 MED ORDER — LIDOCAINE HCL 1 % IJ SOLN
INTRAMUSCULAR | Status: AC | PRN
  Administered 2018-05-08: 10 mL

## 2018-05-08 MED ORDER — MORPHINE SULFATE (PF) 4 MG/ML IV SOLN
4.0000 mg | Freq: Once | INTRAVENOUS | Status: AC
  Administered 2018-05-08: 4 mg via INTRAVENOUS
  Filled 2018-05-08: qty 1

## 2018-05-08 NOTE — ED Notes (Signed)
Bed: WA06 Expected date:  Expected time:  Means of arrival:  Comments: Hold for triage 2 

## 2018-05-08 NOTE — Procedures (Signed)
Interventional Radiology Procedure:   Indications: Occluded left nephrostomy tube  Procedure: Left nephrostomy exchange  Findings: Old tube was partially occluded.  New 12 Fr nephrostomy tube placed.  Tube in renal pelvis.   Complications: None     EBL: None  Plan: Continue to flush bilateral drains.  Patient is scheduled to f/u in IR in approximately 2 weeks for drain checks and exchanges.     Andrew Joyce R. Anselm Pancoast, MD  Pager: [phone]

## 2018-05-08 NOTE — ED Triage Notes (Signed)
Patient was at the cancer center for an infusion,but was not scheduled. Patient states he had blood drawn.  Patient states at 1500 today he began having increased bladder pain. Patient states he tried flushing his left nephrostomy tube several times,but would not flush.

## 2018-05-08 NOTE — ED Provider Notes (Signed)
[facility] DEPT Provider Note   CSN: [id number] Arrival date & time: 05/08/18  1550    History   Chief Complaint Chief Complaint  Patient presents with  . nephrostomy tube issues    HPI Andrew Joyce is a 58 y.o. male with history of bladder cancer with metastatic disease and bilateral nephrostomy tube placements presenting today for obstruction of left nephrostomy tube.  Patient reports that 3 PM today he felt pain of his left kidney which she describes as a expanding throbbing pain constant without aggravating or alleviating factors..  Patient then attempted to flush his nephrostomy tube with saline without success.  Patient reports that he has a extensive history of nephrolithiases and believes that this is what is clogging his nephrostomy tube today.  Patient denies fever/chills or any additional concerns with pain.     HPI  Past Medical History:  Diagnosis Date  . 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 Chronic kidney disease (CKD), stage II (mild)   . Congenital birth defect   . Coronary artery disease    a. s/p CABG in 2008; b.  s/p cath in 2011 for med rx.;  c.  NSTEMI (02/2011):  LHC (02/2011):  LAD occluded, distal LAD filled via left to left collaterals, distal CFX 40-50%, proximal RCA occluded (right to right collaterals), distal RCA occluded, S-RCA occluded proximally, S-OM1/OM2 occluded (new from 2011 - culprit), L-LAD ok, ant apical HK, EF 45-50% - med Rx.  [name] Dyslipidemia   . History of renal stone   . Hx of cardiovascular stress test    Nuclear (06/2009):  EF 71%, small area of infarct/ischemia in inferior wall-unchanged from prior  . Hx of echocardiogram    Echo (05/2013):  EF 55-60%, inf HK, mild AI, normal RVSF, RVSP 28 mmHg  . Hypertension   . Pancreatitis   . Recurrent UTI     Patient Active Problem List   Diagnosis Date Noted  . Sepsis ([place]) 03/22/2018  . Anemia 03/22/2018  . Type 2  diabetes mellitus ([place]) 03/22/2018  . Pyelonephritis 03/21/2018  . Hydronephrosis 03/21/2018  . Insomnia 02/25/2018  . Port-A-Cath in place 09/23/2017  . Malignant neoplasm of urinary bladder ([place]) 09/04/2017  . Diabetes mellitus ([place]) 08/08/2017  . Goals of care, counseling/discussion 01/21/2017  . Benign fasciculation-cramp syndrome 08/22/2015  . Chronic thoracic back pain 03/20/2015  . Fasciculation of lower extremity 11/18/2014  . Bilateral thoracic back pain 09/28/2014  . Nonallopathic lesion-rib cage 07/14/2014  . Nonallopathic lesion of thoracic region 07/14/2014  . Nonallopathic lesion of cervical region 07/14/2014  . Posture imbalance 04/13/2014  . CKD (chronic kidney disease) stage 3, GFR 30-59 ml/min (HCC) 06/01/2013  . Angina pectoris ([place]) 09/18/2011  . Dyspnea 06/13/2011  . NSTEMI (non-ST elevated myocardial infarction) ([place]) 02/27/2011  . CAD (coronary artery disease) 02/06/2011  . HTN (hypertension) 02/06/2011  . Hyperlipidemia 02/06/2011    Past Surgical History:  Procedure Laterality Date  . CARDIAC CATHETERIZATION  07/28/2009   EF 60%; Grafts patent except for SVG to the AM and PD. Native RCA is occluded.  [name] CARDIAC CATHETERIZATION  03/20/2006   EF 60-65%  . CARDIOVASCULAR STRESS TEST  06/12/2009   EF 71%, SMALL AREA OF INFARCT/ISCHEMIA IN INFERIOR WALL; FELT TO NOT BE CHANGED.  [name] CORONARY ARTERY BYPASS GRAFT  03/2006   LIMA GRAFT TO LAD, SAPHENOUS VEIN GRAFT TO THE ACUTE MARGINAL BRANCH THE RIGHT CORONARY, SAPHENOUS VEIN GRAFT TO THE  PDA, SEQUENTIAL VEIN GRAFT TO THE FIRST AND SECOND OBTUSE MARGINAL VESSELS  . IR IMAGING GUIDED PORT INSERTION  09/19/2017  . IR NEPHROSTOMY EXCHANGE LEFT  03/22/2018  . IR NEPHROSTOMY EXCHANGE RIGHT  03/22/2018  . IR NEPHROSTOMY TUBE CHANGE  04/22/2018  . KIDNEY SURGERY    . LEFT HEART CATHETERIZATION WITH CORONARY ANGIOGRAM N/A 02/26/2011   Procedure: LEFT HEART CATHETERIZATION WITH CORONARY ANGIOGRAM;  Surgeon: Thayer Headings, MD;   Location[facility] LAB;  Service: Cardiovascular;  Laterality: N/A;        Home Medications    Prior to Admission medications   Medication Sig Start Date End Date Taking? Authorizing Provider  allopurinol (ZYLOPRIM) 100 MG tablet Take 1 tablet (100 mg total) by mouth daily. 08/08/16  Yes Lyndal Pulley, DO  amLODipine (NORVASC) 5 MG tablet Take 1 tablet (5 mg total) by mouth daily. 09/17/13  Yes [place]ique, Peter M, MD  baclofen (LIORESAL) 10 MG tablet TAKE 2 TABLETS BY MOUTH THREE TIMES DAILY Patient taking differently: Take 20 mg by mouth 3 (three) times daily.  02/03/18  Yes Patel, Donika K, DO  carbamazepine (TEGRETOL) 200 MG tablet TAKE 1 TABLET BY MOUTH TWICE DAILY Patient taking differently: Take 200 mg by mouth 2 (two) times daily.  08/18/17  Yes Patel, Donika K, DO  DULoxetine (CYMBALTA) 20 MG capsule Take 20 mg by mouth at bedtime. For Sleep Aide 09/17/17  Yes [provider]  fenofibrate 160 MG tablet TAKE 1 TABLET BY MOUTH ONCE DAILY Patient taking differently: Take 160 mg by mouth daily.  10/14/17  Yes [place], Peter M, MD  fentaNYL (DURAGESIC - DOSED MCG/HR) 12 MCG/HR Place 1 patch onto the skin every 3 (three) days.  12/08/17  Yes [provider]  isosorbide mononitrate (IMDUR) 60 MG 24 hr tablet TAKE 1 & 1/2 (ONE & ONE-HALF) TABLETS BY MOUTH ONCE DAILY Patient taking differently: Take 60 mg by mouth daily.  06/11/17  Yes [place], Peter M, MD  levothyroxine (SYNTHROID, LEVOTHROID) 25 MCG tablet Take 1 tablet (25 mcg total) by mouth daily. 08/08/16  Yes Lyndal Pulley, DO  meloxicam (MOBIC) 15 MG tablet Take 1 tablet (15 mg total) by mouth daily. 10/29/16  Yes Gardiner Barefoot, DPM  methenamine (MANDELAMINE) 1 g tablet Take 1,000 mg by mouth 3 (three) times daily.  12/21/15  Yes [provider]  Oxycodone HCl 10 MG TABS Take 10 mg by mouth every 6 (six) hours as needed (pain).  12/08/17  Yes [provider]  rOPINIRole (REQUIP) 0.25 MG tablet TAKE 1 TABLET BY  MOUTH 3 HOURS BEFORE BEDTIME Patient taking differently: Take 0.25 mg by mouth at bedtime. TAKE 1 TABLET BY MOUTH 3 HOURS BEFORE BEDTIME 11/03/17  Yes Patel, Donika K, DO  rosuvastatin (CRESTOR) 40 MG tablet Take 1 tablet (40 mg total) by mouth daily. 03/02/18 05/31/18 Yes [place], Peter M, MD  sitaGLIPtin (JANUVIA) 50 MG tablet Take 1 tablet (50 mg total) by mouth daily. 05/05/18  Yes Marrian Salvage, FNP  glucose blood (ACCU-CHEK GUIDE) test strip Use to check blood sugars 2 times daily. Dx Code: E11.9 08/11/17   [name], NP  nitroGLYCERIN (NITROSTAT) 0.4 MG SL tablet Place 1 tablet (0.4 mg total) under the tongue every 5 (five) minutes as needed. For chest pain 06/20/16   [place], Peter M, MD  sodium chloride flush 0.9 % SOLN injection Flush 10 mL of saline into each nephrostomy tube daily. 11/28/17   [provider]  Family History Family History  Problem Relation Age of Onset  . Hyperlipidemia Mother        Living, 33  . Hypertension Mother   . Hyperlipidemia Father        Living 56  . Hypertension Father   . Healthy Sister   . Healthy Brother     Social History Social History   Tobacco Use  . Smoking status: Never Smoker  . Smokeless tobacco: Never Used  Substance Use Topics  . Alcohol use: No    Alcohol/week: 0.0 standard drinks  . Drug use: No     Allergies   Patient has no known allergies.   Review of Systems Review of Systems  Constitutional: Negative for chills and fever.  Gastrointestinal: Negative.  Negative for abdominal pain, nausea and vomiting.  Genitourinary: Positive for flank pain (Left). Negative for hematuria.  All other systems reviewed and are negative.  Physical Exam Updated Vital Signs BP 137/80 (BP Location: Right Arm)   Pulse 76   Temp 98.9 F (37.2 C) (Oral)   Resp 18   Ht 5\' 10"  (1.778 m)   Wt 62 kg   SpO2 97%   BMI 19.61 kg/m   Physical Exam Constitutional:      General: He is not in acute distress.     Appearance: Normal appearance. He is well-developed. He is not ill-appearing or diaphoretic.  HENT:     Head: Normocephalic and atraumatic.     Right Ear: External ear normal.     Left Ear: External ear normal.     Nose: Nose normal.  Eyes:     General: Vision grossly intact. Gaze aligned appropriately.     Pupils: Pupils are equal, round, and reactive to light.  Neck:     Musculoskeletal: Normal range of motion.     Trachea: Trachea and phonation normal. No tracheal deviation.  Cardiovascular:     Rate and Rhythm: Normal rate and regular rhythm.  Pulmonary:     Effort: Pulmonary effort is normal. No respiratory distress.  Abdominal:     General: There is no distension.     Palpations: Abdomen is soft.     Tenderness: There is no abdominal tenderness. There is no guarding or rebound.  Musculoskeletal: Normal range of motion.  Skin:    General: Skin is warm and dry.          Comments: Bilateral nephrostomy tubes present without erythema, drainage or pain to light palpation.   Neurological:     Mental Status: He is alert.     GCS: GCS eye subscore is 4. GCS verbal subscore is 5. GCS motor subscore is 6.     Comments: Speech is clear and goal oriented, follows commands Major Cranial nerves without deficit, no facial droop Moves extremities without ataxia, coordination intact  Psychiatric:        Behavior: Behavior normal.    ED Treatments / Results  Labs (all labs ordered are listed, but only abnormal results are displayed) Labs Reviewed  CBC WITH DIFFERENTIAL/PLATELET - Abnormal; Notable for the following components:      Result Value   RBC 3.08 (*)    Hemoglobin 9.0 (*)    HCT 29.3 (*)    Lymphs Abs 0.6 (*)    All other components within normal limits  COMPREHENSIVE METABOLIC PANEL - Abnormal; Notable for the following components:   Glucose, Bld 142 (*)    BUN 26 (*)    Creatinine, Ser 1.54 (*)  Albumin 3.2 (*)    AST 13 (*)    Total Bilirubin 0.2 (*)    GFR  calc non Af Amer 49 (*)    GFR calc Af Amer 56 (*)    All other components within normal limits  URINALYSIS, ROUTINE W REFLEX MICROSCOPIC - Abnormal; Notable for the following components:   APPearance TURBID (*)    pH 9.0 (*)    Protein, ur 30 (*)    Leukocytes,Ua LARGE (*)    WBC, UA >50 (*)    Bacteria, UA MANY (*)    All other components within normal limits  LIPASE, BLOOD    EKG None  Radiology No results found.  Procedures Procedures (including critical care time)  Medications Ordered in ED Medications  lidocaine (XYLOCAINE) 1 % (with pres) injection (has no administration in time range)  morphine 4 MG/ML injection 4 mg (4 mg Intravenous Given 05/08/18 1750)  lidocaine (XYLOCAINE) 1 % (with pres) injection (10 mLs  Given 05/08/18 1830)  iohexol (OMNIPAQUE) 300 MG/ML solution 50 mL (10 mLs Other Contrast Given 05/08/18 1835)     Initial Impression / Assessment and Plan / ED Course  I have reviewed the triage vital signs and the nursing notes.  Pertinent labs & imaging results that were available during my care of the patient were reviewed by me and considered in my medical decision making (see chart for details).  Clinical Course as of May 08 2018  Fri May 08, 2018  1650 Unsuccessful attempt to flush nephrostomy tube by nursing staff.   [BM]  1700 Discussed case with on-call interventional radiologist Dr. Laurence Ferrari who is asked that we admit patient to hospitalist service.  IR team to evaluate tomorrow for nephrostomy tube change.  He advises basic lab work and to check kidney function.  Dr. Laurence Ferrari advises new oncall Dr. Anselm Pancoast to be reconsulted by Hospitalist team after admission. Dr Anselm Pancoast pager # [phone]   [BM]  [phone] Discussed case with Dr. Anselm Pancoast who will see patient here in emergency department. Pending successful procedure patient will likely not need admission.   [BM]  1932 Patient returned from interventional radiology.  Resting comfortably no acute distress.   Nephrostomy tube was replaced and is now flowing unobstructed.  Patient states that his pain has completely resolved and he has no further complaints and wishes to be discharged at this time.   [BM]    Clinical Course User Index [BM] Deliah Boston, PA-C   CBC appears baseline with hemoglobin of 9.0 CMP appears baseline with elevated creatinine Lipase within normal limits Urinalysis does show leukocytes, white blood cells and many bacteria however urine sample was obtained from his bag.  Patient is without infectious symptoms denying fever, abdominal pain, nausea or vomiting or any additional concerns, discussed with Dr. Eulis Foster, no evaluation is indicated as patient is asymptomatic regarding this.  Follow-up with primary care provider.  No antibiotics indicated this time.  On final reevaluation patient is resting comfortably no acute distress states understanding of care plan and follow-up with his primary care team as well as IR for follow-up of nephrostomy tube.  At this time there does not appear to be any evidence of an acute emergency medical condition and the patient appears stable for discharge with appropriate outpatient follow up. Diagnosis was discussed with patient who verbalizes understanding of care plan and is agreeable to discharge. I have discussed return precautions with patient who verbalizes understanding of return precautions. Patient encouraged to follow-up with their  PCP. All questions answered.  Patient has been discharged in good condition.  Patient's case rediscussed with Dr. Eulis Foster who agrees with plan to discharge with follow-up.   Note: Portions of this report may have been transcribed using voice recognition software. Every effort was made to ensure accuracy; however, inadvertent computerized transcription errors may still be present. Final Clinical Impressions(s) / ED Diagnoses   Final diagnoses:  Obstruction of nephrostomy tube [facility])    ED Discharge Orders     None       Gari Crown 05/08/18 2020    Daleen Bo, MD 05/09/18 1041

## 2018-05-08 NOTE — Patient Instructions (Signed)

## 2018-05-08 NOTE — Progress Notes (Signed)
Hematology and Oncology Follow Up Visit  Andrew Joyce [id number] December 13, 1960 58 y.o. 05/08/2018 8:57 AM Gayla Medicus, Delphia Grates, NPShambley, Delphia Grates, NP   Principle Diagnosis: 58 year old man with bladder cancer diagnosed in 2019.  He was found to have high-grade urothelial carcinoma with stage IV disease involving pelvic adenopathy.    Prior Therapy:  He is status post TURBT on July 23, 2017 which showed high-grade urothelial carcinoma with sarcomatoid feature.  Systemic chemotherapy utilizing gemcitabine and cisplatin cycle 1 started on September 16, 2017.  He completed 3 cycles of therapy in October 2019.  He is status post definitive therapy with radiation completed to the pelvis on March 03, 2018.  Current therapy: Pembrolizumab 200 mg every 3 weeks started on April 23, 2018.  He is here for evaluation prior to cycle 2 of therapy.    Interim History: Andrew Joyce returns today for a repeat evaluation.  Since the last visit, he received the first cycle Pembrolizumab without any major complications.  He denies any nausea, fatigue or change in bowel habits.  He does report some occasional itching but no rash.  His performance status and quality of life remains unchanged.  Continues to have chronic pain issues related to his bladder tumor and previous fistula.  This pain has been poorly controlled despite current pain regimen at this time.  He denied headaches, blurry vision, syncope or seizures.  Denies any fevers, chills or sweats.  Denied chest pain, palpitation, orthopnea or leg edema.  Denied cough, wheezing or hemoptysis.  Denied nausea, vomiting or abdominal pain.  Denies any constipation or diarrhea.  Denies any frequency urgency or hesitancy.  Denies any arthralgias or myalgias.  Denies any skin rashes or lesions.  Denies any bleeding or clotting tendency.  Denies any easy bruising.  Denies any hair or nail changes.  Denies any anxiety or depression.  Remaining review of  system is negative.        Medications: I have reviewed the patient's current medications.  Current Outpatient Medications  Medication Sig Dispense Refill  . allopurinol (ZYLOPRIM) 100 MG tablet Take 1 tablet (100 mg total) by mouth daily. 30 tablet 6  . amLODipine (NORVASC) 5 MG tablet Take 1 tablet (5 mg total) by mouth daily. 30 tablet 6  . baclofen (LIORESAL) 10 MG tablet TAKE 2 TABLETS BY MOUTH THREE TIMES DAILY (Patient taking differently: Take 20 mg by mouth 3 (three) times daily. ) 180 each 5  . carbamazepine (TEGRETOL) 200 MG tablet TAKE 1 TABLET BY MOUTH TWICE DAILY (Patient taking differently: Take 200 mg by mouth 2 (two) times daily. ) 60 tablet 5  . DULoxetine (CYMBALTA) 20 MG capsule Take 20 mg by mouth at bedtime. For Sleep Aide  2  . fenofibrate 160 MG tablet TAKE 1 TABLET BY MOUTH ONCE DAILY (Patient taking differently: Take 160 mg by mouth daily. ) 90 tablet 1  . fentaNYL (DURAGESIC - DOSED MCG/HR) 12 MCG/HR Place 1 patch onto the skin every 3 (three) days.     [name] gabapentin (NEURONTIN) 300 MG capsule Take 300 mg by mouth at bedtime as needed for pain.    [name] glucose blood (ACCU-CHEK GUIDE) test strip Use to check blood sugars 2 times daily. Dx Code: E11.9 100 each 12  . isosorbide mononitrate (IMDUR) 60 MG 24 hr tablet TAKE 1 & 1/2 (ONE & ONE-HALF) TABLETS BY MOUTH ONCE DAILY (Patient taking differently: Take 60 mg by mouth daily. ) 135 tablet 2  . levothyroxine (SYNTHROID, LEVOTHROID) 25  MCG tablet Take 1 tablet (25 mcg total) by mouth daily. 30 tablet 1  . lidocaine-prilocaine (EMLA) cream Apply 1 application topically as needed. 30 g 0  . meloxicam (MOBIC) 15 MG tablet Take 1 tablet (15 mg total) by mouth daily. 30 tablet 1  . methenamine (MANDELAMINE) 1 g tablet Take 1,000 mg by mouth 3 (three) times daily.     . nitroGLYCERIN (NITROSTAT) 0.4 MG SL tablet Place 1 tablet (0.4 mg total) under the tongue every 5 (five) minutes as needed. For chest pain 25 tablet 3  .  Oxycodone HCl 10 MG TABS Take 10 mg by mouth every 6 (six) hours as needed (pain).     [name] rOPINIRole (REQUIP) 0.25 MG tablet TAKE 1 TABLET BY MOUTH 3 HOURS BEFORE BEDTIME (Patient taking differently: Take 0.25 mg by mouth at bedtime. TAKE 1 TABLET BY MOUTH 3 HOURS BEFORE BEDTIME) 30 tablet 5  . rosuvastatin (CRESTOR) 40 MG tablet Take 1 tablet (40 mg total) by mouth daily. 30 tablet 4  . sitaGLIPtin (JANUVIA) 50 MG tablet Take 1 tablet (50 mg total) by mouth daily. 90 tablet 0  . sodium chloride flush 0.9 % SOLN injection Flush 10 mL of saline into each nephrostomy tube daily.     No current facility-administered medications for this visit.      Allergies: No Known Allergies  Past Medical History, Surgical history, Social history, and Family History were reviewed and updated.    Physical Exam:  Blood pressure 108/77, pulse 89, temperature 98.5 F (36.9 C), temperature source Oral, resp. rate 18, height 5\' 10"  (1.778 m), weight 136 lb 14.4 oz (62.1 kg), SpO2 96 %.    ECOG: 0   General appearance: Comfortable appearing without any discomfort Head: Normocephalic without any trauma Oropharynx: Mucous membranes are moist and pink without any thrush or ulcers. Eyes: Pupils are equal and round reactive to light. Lymph nodes: No cervical, supraclavicular, inguinal or axillary lymphadenopathy.   Heart:regular rate and rhythm.  S1 and S2 without leg edema. Lung: Clear without any rhonchi or wheezes.  No dullness to percussion. Abdomin: Soft, nontender, nondistended with good bowel sounds.  No hepatosplenomegaly. Musculoskeletal: No joint deformity or effusion.  Full range of motion noted. Neurological: No deficits noted on motor, sensory and deep tendon reflex exam. Skin: No petechial rash or dryness.  Appeared moist.        Lab Results: Lab Results  Component Value Date   WBC 7.3 04/21/2018   HGB 10.2 (L) 04/21/2018   HCT 33.2 (L) 04/21/2018   MCV 93.8 04/21/2018   PLT 264  04/21/2018     Chemistry      Component Value Date/Time   NA 138 04/21/2018 2028   NA 141 07/24/2017 0942   K 4.0 04/21/2018 2028   CL 103 04/21/2018 2028   CO2 25 04/21/2018 2028   BUN 20 04/21/2018 2028   BUN 35 (H) 07/24/2017 0942   CREATININE 1.41 (H) 04/21/2018 2028   CREATININE 1.55 (H) 04/17/2018 0939   CREATININE 1.38 (H) 03/01/2015 0942      Component Value Date/Time   CALCIUM 9.5 04/21/2018 2028   ALKPHOS 67 04/21/2018 2028   AST 11 (L) 04/21/2018 2028   AST 10 (L) 04/17/2018 0939   ALT 10 04/21/2018 2028   ALT 7 04/17/2018 0939   BILITOT 0.3 04/21/2018 2028   BILITOT 0.2 (L) 04/17/2018 0939        Impression and Plan:  58 year old man with:  1.  High-grade  urothelial carcinoma of the bladder with pelvic adenopathy indicating stage IV disease diagnosed in July 2019.    He is currently receiving Pembrolizumab without any major complications.  Risks and benefits of continuing this treatment long-term was discussed today and is agreeable to continue.  Immune mediated complications were reiterated.  These would include pneumonitis, colitis and thyroid disease.  The plan is to treat with 3-4 cycles before repeat imaging studies.    2.  IV access: Port-A-Cath will continue to be in use at this time without any issues.  3.  Antiemetics: No nausea or vomiting reported at this time.  4.  Chronic renal insufficiency: Kidney function remains stable with nephrostomy tube in place.  5.  Goals of care: His disease is incurable and any therapy remains palliative at this time.  6.  Anemia: Due to malignancy as well as anticancer treatment.  Hemoglobin stable does not require any intervention.  7.  Pain: Related to his malignancy and previous treatment.  He is currently on fentanyl patch and oxycodone.  These might need to be adjusted if his pain continues to be an issue  8.  Follow-up: We will be on May 6 for the second cycle of Pembrolizumab and 3 weeks after that  for evaluation before cycle 3.  25 minutes was spent with the patient face-to-face today.  More than 50% of time was dedicated to reviewing his disease status, treatment options and complications related to therapy.    Zola Button, MD 5/1/20208:57 AM

## 2018-05-08 NOTE — ED Notes (Signed)
Pt transport to IR 

## 2018-05-08 NOTE — Discharge Instructions (Addendum)
You have been diagnosed today with Obstruction of Left Nephrostomy Tube.  At this time there does not appear to be the presence of an emergent medical condition, however there is always the potential for conditions to change. Please read and follow the below instructions.  Please return to the Emergency Department immediately for any new or worsening symptoms. Please be sure to follow up with your Primary Care Provider within one week regarding your visit today; please call their office to schedule an appointment even if you are feeling better for a follow-up visit. Please call your primary care provider, oncologist and radiology team tomorrow to inform them of your visit today and left nephrostomy tube change to schedule follow-ups for next week.  Return for any new or worsening symptoms.  Get help right away if: You have chills or a fever. You have a tube that drains the bladder and: The tube stops draining pee. The tube falls out. You have blood in your pee. Get help right away if: You have pain in your abdomen You have chest pain or have trouble breathing. You have a new appearance of blood in your urine. You have a fever or chills. You have back pain that is not relieved by your medicine. You have decreased urine output. Your nephrostomy tube comes out.  Please read the additional information packets attached to your discharge summary.  Do not take your medicine if  develop an itchy rash, swelling in your mouth or lips, or difficulty breathing.

## 2018-05-08 NOTE — Progress Notes (Signed)
Patient's treatment is due on 05/13/2018, patient aware. Port deaccessed.

## 2018-05-08 NOTE — Telephone Encounter (Signed)
Scheduled appt per 5/1 sch message - unable to reach patient , left message with appt date and time

## 2018-05-08 NOTE — ED Notes (Signed)
RN attempted to irrigate left sided nephrostomy tube and resistance was felt, unable to irrigate.

## 2018-05-09 ENCOUNTER — Encounter (HOSPITAL_COMMUNITY): Payer: Self-pay | Admitting: Diagnostic Radiology

## 2018-05-11 LAB — CBG MONITORING, ED: Glucose-Capillary: 87 mg/dL (ref 70–99)

## 2018-05-13 ENCOUNTER — Telehealth (INDEPENDENT_AMBULATORY_CARE_PROVIDER_SITE_OTHER): Payer: Medicare Other | Admitting: Neurology

## 2018-05-13 ENCOUNTER — Inpatient Hospital Stay: Payer: Medicare Other

## 2018-05-13 ENCOUNTER — Other Ambulatory Visit: Payer: Self-pay

## 2018-05-13 ENCOUNTER — Telehealth: Payer: Self-pay | Admitting: Oncology

## 2018-05-13 DIAGNOSIS — G2581 Restless legs syndrome: Secondary | ICD-10-CM | POA: Diagnosis not present

## 2018-05-13 DIAGNOSIS — R253 Fasciculation: Secondary | ICD-10-CM | POA: Diagnosis not present

## 2018-05-13 DIAGNOSIS — G253 Myoclonus: Secondary | ICD-10-CM

## 2018-05-13 MED ORDER — CARBAMAZEPINE ER 100 MG PO CP12: 100.0000 mg | ORAL_CAPSULE | Freq: Two times a day (BID) | ORAL | 3 refills | Status: AC

## 2018-05-13 MED ORDER — ROPINIROLE HCL 0.25 MG PO TABS: 0.2500 mg | ORAL_TABLET | Freq: Every day | ORAL | 3 refills | Status: AC

## 2018-05-13 NOTE — Telephone Encounter (Signed)
R/s appt per 5/6 - spoke with patient and he is aware of appt date and time on 5/8

## 2018-05-13 NOTE — Progress Notes (Signed)
    Virtual Visit via Telephone Note The purpose of this virtual visit is to provide medical care while limiting exposure to the novel coronavirus.    Consent was obtained for phone visit:  Yes.   Answered questions that patient had about telehealth interaction:  Yes.   I discussed the limitations, risks, security and privacy concerns of performing an evaluation and management service by telephone. I also discussed with the patient that there may be a patient responsible charge related to this service. The patient expressed understanding and agreed to proceed.  Pt location: Home Physician Location: office Name of referring provider:  [name], NP I connected with .Andrew Joyce at patients initiation/request on 05/13/2018 at 10:00 AM EDT by telephone and verified that I am speaking with the correct person using two identifiers.  Pt MRN:  [id number] Pt DOB:  March 09, 1960   History of Present Illness: This is a 58 year-old man returning for evaluation of RLS and cramp-fasciculation syndrome.  He continues to take carbamazepine 200mg  BID and baclofen 20mg  TID.   At his last visit, he reported episodic twitching of the arms and legs.  Labs returned normal.  He continues to have spells of these which are random and can occur for a few days and then go away.  No specific triggers.  No loss of consciousness.  There is no pain, but it is annoying because he as drop items from his hands.   He also takes ropinirole 0.25mg  at bedtime for RLS which controls his RLS.   He was diagnosed with stage IV bladder cancer in 2019 and undergoing chemotherapy and radiation.   Assessment and Plan:   1.  Myoclonic jerks, possibly side effect of carbamazepine.   - Plan to reduce carbamazepine to 100mg  BID  - Call with update in 2 weeks and may need to continue to taper  2.  Restless leg syndrome, well-controlled  - Refills provided for ropinirole 0.25mg  at bedtime  3.  Cramp-fasciculation  syndrome, stable  - Continue baclofen 20mg  TID   Follow Up Instructions:   I discussed the assessment and treatment plan with the patient. The patient was provided an opportunity to ask questions and all were answered. The patient agreed with the plan and demonstrated an understanding of the instructions.   The patient was advised to call back or seek an in-person evaluation if the symptoms worsen or if the condition fails to improve as anticipated.  Total Time spent in visit with the patient was:  15 min, of which 100% of the time was spent in counseling and/or coordinating care.   Pt understands and agrees with the plan of care outlined.     Alda Berthold, DO

## 2018-05-15 ENCOUNTER — Inpatient Hospital Stay: Payer: Medicare Other

## 2018-05-15 ENCOUNTER — Inpatient Hospital Stay: Payer: Medicare Other | Attending: Oncology

## 2018-05-15 ENCOUNTER — Other Ambulatory Visit: Payer: Self-pay

## 2018-05-15 VITALS — BP 113/65 | HR 82 | Temp 98.7°F | Resp 16

## 2018-05-15 DIAGNOSIS — Z95828 Presence of other vascular implants and grafts: Secondary | ICD-10-CM

## 2018-05-15 DIAGNOSIS — C775 Secondary and unspecified malignant neoplasm of intrapelvic lymph nodes: Secondary | ICD-10-CM | POA: Diagnosis not present

## 2018-05-15 DIAGNOSIS — C679 Malignant neoplasm of bladder, unspecified: Secondary | ICD-10-CM

## 2018-05-15 DIAGNOSIS — Z79899 Other long term (current) drug therapy: Secondary | ICD-10-CM | POA: Diagnosis not present

## 2018-05-15 DIAGNOSIS — Z5112 Encounter for antineoplastic immunotherapy: Secondary | ICD-10-CM | POA: Insufficient documentation

## 2018-05-15 LAB — CMP (CANCER CENTER ONLY)
ALT: 8 U/L (ref 0–44)
AST: 11 U/L — ABNORMAL LOW (ref 15–41)
Albumin: 2.8 g/dL — ABNORMAL LOW (ref 3.5–5.0)
Alkaline Phosphatase: 68 U/L (ref 38–126)
Anion gap: 9 (ref 5–15)
BUN: 19 mg/dL (ref 6–20)
CO2: 27 mmol/L (ref 22–32)
Calcium: 9.8 mg/dL (ref 8.9–10.3)
Chloride: 101 mmol/L (ref 98–111)
Creatinine: 1.42 mg/dL — ABNORMAL HIGH (ref 0.61–1.24)
GFR, Est AFR Am: >60 mL/min (ref >60)
GFR, Estimated: 54 mL/min — ABNORMAL LOW (ref >60)
Glucose, Bld: 191 mg/dL — ABNORMAL HIGH (ref 70–99)
Potassium: 4.4 mmol/L (ref 3.5–5.1)
Sodium: 137 mmol/L (ref 135–145)
Total Bilirubin: 0.2 mg/dL — ABNORMAL LOW (ref 0.3–1.2)
Total Protein: 7.6 g/dL (ref 6.5–8.1)

## 2018-05-15 LAB — CBC WITH DIFFERENTIAL (CANCER CENTER ONLY)
Abs Immature Granulocytes: 0.03 10*3/uL (ref 0.00–0.07)
Basophils Absolute: 0 10*3/uL (ref 0.0–0.1)
Basophils Relative: 1 %
Eosinophils Absolute: 0.2 10*3/uL (ref 0.0–0.5)
Eosinophils Relative: 3 %
HCT: 28.5 % — ABNORMAL LOW (ref 39.0–52.0)
Hemoglobin: 8.8 g/dL — ABNORMAL LOW (ref 13.0–17.0)
Immature Granulocytes: 1 %
Lymphocytes Relative: 8 %
Lymphs Abs: 0.5 10*3/uL — ABNORMAL LOW (ref 0.7–4.0)
MCH: 28.5 pg (ref 26.0–34.0)
MCHC: 30.9 g/dL (ref 30.0–36.0)
MCV: 92.2 fL (ref 80.0–100.0)
Monocytes Absolute: 0.5 10*3/uL (ref 0.1–1.0)
Monocytes Relative: 8 %
Neutro Abs: 4.8 10*3/uL (ref 1.7–7.7)
Neutrophils Relative %: 79 %
Platelet Count: 229 10*3/uL (ref 150–400)
RBC: 3.09 MIL/uL — ABNORMAL LOW (ref 4.22–5.81)
RDW: 14.5 % (ref 11.5–15.5)
WBC Count: 6 10*3/uL (ref 4.0–10.5)
nRBC: 0 % (ref 0.0–0.2)

## 2018-05-15 MED ORDER — SODIUM CHLORIDE 0.9% FLUSH
10.0000 mL | INTRAVENOUS | Status: DC | PRN
  Administered 2018-05-15: 15:00:00 10 mL
  Filled 2018-05-15: qty 10

## 2018-05-15 MED ORDER — SODIUM CHLORIDE 0.9 % IV SOLN
200.0000 mg | Freq: Once | INTRAVENOUS | Status: AC
  Administered 2018-05-15: 15:00:00 200 mg via INTRAVENOUS
  Filled 2018-05-15: qty 8

## 2018-05-15 MED ORDER — HEPARIN SOD (PORK) LOCK FLUSH 100 UNIT/ML IV SOLN
500.0000 [IU] | Freq: Once | INTRAVENOUS | Status: AC | PRN
  Administered 2018-05-15: 500 [IU]
  Filled 2018-05-15: qty 5

## 2018-05-15 MED ORDER — SODIUM CHLORIDE 0.9 % IV SOLN
Freq: Once | INTRAVENOUS | Status: AC
  Administered 2018-05-15: 15:00:00 via INTRAVENOUS
  Filled 2018-05-15: qty 250

## 2018-05-15 MED ORDER — SODIUM CHLORIDE 0.9% FLUSH
10.0000 mL | Freq: Once | INTRAVENOUS | Status: AC
  Administered 2018-05-15: 10 mL
  Filled 2018-05-15: qty 10

## 2018-05-15 NOTE — Patient Instructions (Addendum)
[facility] Discharge Instructions for Patients Receiving Chemotherapy  Today you received the following chemotherapy agents:  Keytruda.  To help prevent nausea and vomiting after your treatment, we encourage you to take your nausea medication as directed.   If you develop nausea and vomiting that is not controlled by your nausea medication, call the clinic.   BELOW ARE SYMPTOMS THAT SHOULD BE REPORTED IMMEDIATELY:  *FEVER GREATER THAN 100.5 F  *CHILLS WITH OR WITHOUT FEVER  NAUSEA AND VOMITING THAT IS NOT CONTROLLED WITH YOUR NAUSEA MEDICATION  *UNUSUAL SHORTNESS OF BREATH  *UNUSUAL BRUISING OR BLEEDING  TENDERNESS IN MOUTH AND THROAT WITH OR WITHOUT PRESENCE OF ULCERS  *URINARY PROBLEMS  *BOWEL PROBLEMS  UNUSUAL RASH Items with * indicate a potential emergency and should be followed up as soon as possible.  Feel free to call the clinic should you have any questions or concerns. The clinic phone number is [phone].  Please show the CHEMO ALERT CARD at check-in to the Emergency Department and triage nurse.    

## 2018-05-15 NOTE — Patient Instructions (Signed)

## 2018-05-19 ENCOUNTER — Other Ambulatory Visit: Payer: Self-pay | Admitting: Cardiology

## 2018-05-21 ENCOUNTER — Other Ambulatory Visit (HOSPITAL_COMMUNITY): Payer: Medicare Other

## 2018-05-21 ENCOUNTER — Encounter (HOSPITAL_COMMUNITY): Payer: Self-pay | Admitting: Diagnostic Radiology

## 2018-05-21 ENCOUNTER — Ambulatory Visit (HOSPITAL_COMMUNITY)
Admission: RE | Admit: 2018-05-21 | Discharge: 2018-05-21 | Disposition: A | Discharge status: Home / Self Care | Payer: Medicare Other | Source: Ambulatory Visit | Attending: Interventional Radiology | Admitting: Interventional Radiology

## 2018-05-21 ENCOUNTER — Other Ambulatory Visit: Payer: Self-pay

## 2018-05-21 ENCOUNTER — Other Ambulatory Visit (HOSPITAL_COMMUNITY): Payer: Self-pay | Admitting: Interventional Radiology

## 2018-05-21 ENCOUNTER — Other Ambulatory Visit (HOSPITAL_COMMUNITY): Payer: Self-pay | Admitting: Diagnostic Radiology

## 2018-05-21 DIAGNOSIS — Z436 Encounter for attention to other artificial openings of urinary tract: Secondary | ICD-10-CM | POA: Insufficient documentation

## 2018-05-21 DIAGNOSIS — N1330 Unspecified hydronephrosis: Secondary | ICD-10-CM

## 2018-05-21 DIAGNOSIS — N1339 Other hydronephrosis: Secondary | ICD-10-CM | POA: Insufficient documentation

## 2018-05-21 DIAGNOSIS — C679 Malignant neoplasm of bladder, unspecified: Secondary | ICD-10-CM | POA: Diagnosis not present

## 2018-05-21 HISTORY — PX: IR NEPHROSTOMY EXCHANGE RIGHT: IMG6070

## 2018-05-21 HISTORY — PX: IR NEPHROSTOMY EXCHANGE LEFT: IMG6069

## 2018-05-21 MED ORDER — IOHEXOL 300 MG/ML  SOLN
100.0000 mL | Freq: Once | INTRAMUSCULAR | Status: AC | PRN
  Administered 2018-05-21: 20 mL

## 2018-05-21 MED ORDER — LIDOCAINE HCL 1 % IJ SOLN
INTRAMUSCULAR | Status: AC | PRN
  Administered 2018-05-21: 10 mL via INTRADERMAL

## 2018-05-21 MED ORDER — LIDOCAINE HCL 1 % IJ SOLN
INTRAMUSCULAR | Status: AC
  Filled 2018-05-21: qty 20

## 2018-05-21 NOTE — Procedures (Signed)
Interventional Radiology Procedure:   Indications: Routine exchange of nephrostomy tubes  Procedure: Bilateral nephrostomy tube exchanges  Findings: 12 Fr nephrostomy placed in left renal pelvis.  10 Fr tube placed in right renal pelvis  Complications: None     EBL: None  Plan: Plan for 4 week exchange.     Janette Harvie R. Anselm Pancoast, MD  Pager: [phone]

## 2018-05-29 ENCOUNTER — Other Ambulatory Visit: Payer: Medicare Other

## 2018-05-29 ENCOUNTER — Ambulatory Visit: Payer: Medicare Other

## 2018-05-29 ENCOUNTER — Ambulatory Visit: Payer: Medicare Other | Admitting: Oncology

## 2018-05-29 DIAGNOSIS — N3289 Other specified disorders of bladder: Secondary | ICD-10-CM | POA: Diagnosis not present

## 2018-05-29 DIAGNOSIS — G894 Chronic pain syndrome: Secondary | ICD-10-CM | POA: Diagnosis not present

## 2018-05-29 DIAGNOSIS — G893 Neoplasm related pain (acute) (chronic): Secondary | ICD-10-CM | POA: Diagnosis not present

## 2018-05-29 DIAGNOSIS — G4700 Insomnia, unspecified: Secondary | ICD-10-CM | POA: Diagnosis not present

## 2018-06-05 ENCOUNTER — Inpatient Hospital Stay (HOSPITAL_BASED_OUTPATIENT_CLINIC_OR_DEPARTMENT_OTHER): Payer: Medicare Other | Admitting: Oncology

## 2018-06-05 ENCOUNTER — Inpatient Hospital Stay: Payer: Medicare Other

## 2018-06-05 ENCOUNTER — Other Ambulatory Visit: Payer: Self-pay

## 2018-06-05 VITALS — BP 104/71 | HR 94 | Temp 99.1°F | Resp 18 | Ht 70.0 in | Wt 136.6 lb

## 2018-06-05 DIAGNOSIS — Z7189 Other specified counseling: Secondary | ICD-10-CM

## 2018-06-05 DIAGNOSIS — Z95828 Presence of other vascular implants and grafts: Secondary | ICD-10-CM

## 2018-06-05 DIAGNOSIS — C679 Malignant neoplasm of bladder, unspecified: Secondary | ICD-10-CM | POA: Diagnosis not present

## 2018-06-05 DIAGNOSIS — C775 Secondary and unspecified malignant neoplasm of intrapelvic lymph nodes: Secondary | ICD-10-CM

## 2018-06-05 DIAGNOSIS — R52 Pain, unspecified: Secondary | ICD-10-CM

## 2018-06-05 DIAGNOSIS — D630 Anemia in neoplastic disease: Secondary | ICD-10-CM

## 2018-06-05 DIAGNOSIS — N289 Disorder of kidney and ureter, unspecified: Secondary | ICD-10-CM | POA: Diagnosis not present

## 2018-06-05 DIAGNOSIS — D649 Anemia, unspecified: Secondary | ICD-10-CM | POA: Diagnosis not present

## 2018-06-05 DIAGNOSIS — K5900 Constipation, unspecified: Secondary | ICD-10-CM

## 2018-06-05 DIAGNOSIS — Z79899 Other long term (current) drug therapy: Secondary | ICD-10-CM | POA: Diagnosis not present

## 2018-06-05 DIAGNOSIS — Z5112 Encounter for antineoplastic immunotherapy: Secondary | ICD-10-CM | POA: Diagnosis not present

## 2018-06-05 LAB — CBC WITH DIFFERENTIAL (CANCER CENTER ONLY)
Abs Immature Granulocytes: 0.04 10*3/uL (ref 0.00–0.07)
Basophils Absolute: 0 10*3/uL (ref 0.0–0.1)
Basophils Relative: 0 %
Eosinophils Absolute: 0.3 10*3/uL (ref 0.0–0.5)
Eosinophils Relative: 3 %
HCT: 26.5 % — ABNORMAL LOW (ref 39.0–52.0)
Hemoglobin: 8.2 g/dL — ABNORMAL LOW (ref 13.0–17.0)
Immature Granulocytes: 0 %
Lymphocytes Relative: 4 %
Lymphs Abs: 0.4 10*3/uL — ABNORMAL LOW (ref 0.7–4.0)
MCH: 28.9 pg (ref 26.0–34.0)
MCHC: 30.9 g/dL (ref 30.0–36.0)
MCV: 93.3 fL (ref 80.0–100.0)
Monocytes Absolute: 0.6 10*3/uL (ref 0.1–1.0)
Monocytes Relative: 7 %
Neutro Abs: 7.6 10*3/uL (ref 1.7–7.7)
Neutrophils Relative %: 86 %
Platelet Count: 275 10*3/uL (ref 150–400)
RBC: 2.84 MIL/uL — ABNORMAL LOW (ref 4.22–5.81)
RDW: 14.6 % (ref 11.5–15.5)
WBC Count: 8.9 10*3/uL (ref 4.0–10.5)
nRBC: 0 % (ref 0.0–0.2)

## 2018-06-05 LAB — CMP (CANCER CENTER ONLY)
ALT: 7 U/L (ref 0–44)
AST: 9 U/L — ABNORMAL LOW (ref 15–41)
Albumin: 2.7 g/dL — ABNORMAL LOW (ref 3.5–5.0)
Alkaline Phosphatase: 77 U/L (ref 38–126)
Anion gap: 9 (ref 5–15)
BUN: 28 mg/dL — ABNORMAL HIGH (ref 6–20)
CO2: 27 mmol/L (ref 22–32)
Calcium: 9.9 mg/dL (ref 8.9–10.3)
Chloride: 100 mmol/L (ref 98–111)
Creatinine: 1.8 mg/dL — ABNORMAL HIGH (ref 0.61–1.24)
GFR, Est AFR Am: 47 mL/min — ABNORMAL LOW (ref >60)
GFR, Estimated: 40 mL/min — ABNORMAL LOW (ref >60)
Glucose, Bld: 179 mg/dL — ABNORMAL HIGH (ref 70–99)
Potassium: 4.1 mmol/L (ref 3.5–5.1)
Sodium: 136 mmol/L (ref 135–145)
Total Bilirubin: 0.2 mg/dL — ABNORMAL LOW (ref 0.3–1.2)
Total Protein: 7.5 g/dL (ref 6.5–8.1)

## 2018-06-05 LAB — TSH: TSH: 2.404 u[IU]/mL (ref 0.320–4.118)

## 2018-06-05 MED ORDER — SODIUM CHLORIDE 0.9 % IV SOLN
Freq: Once | INTRAVENOUS | Status: AC
  Administered 2018-06-05: 13:00:00 via INTRAVENOUS
  Filled 2018-06-05: qty 250

## 2018-06-05 MED ORDER — SENNOSIDES-DOCUSATE SODIUM 8.6-50 MG PO TABS: 1.0000 | ORAL_TABLET | Freq: Every day | ORAL | 3 refills | Status: AC

## 2018-06-05 MED ORDER — HEPARIN SOD (PORK) LOCK FLUSH 100 UNIT/ML IV SOLN
500.0000 [IU] | Freq: Once | INTRAVENOUS | Status: DC | PRN
  Filled 2018-06-05: qty 5

## 2018-06-05 MED ORDER — SODIUM CHLORIDE 0.9% FLUSH
10.0000 mL | INTRAVENOUS | Status: DC | PRN
  Filled 2018-06-05: qty 10

## 2018-06-05 MED ORDER — SODIUM CHLORIDE 0.9% FLUSH
10.0000 mL | Freq: Once | INTRAVENOUS | Status: AC
  Administered 2018-06-05: 10 mL
  Filled 2018-06-05: qty 10

## 2018-06-05 MED ORDER — TRIAMCINOLONE ACETONIDE 0.5 % EX OINT: 1.0000 "application " | TOPICAL_OINTMENT | Freq: Two times a day (BID) | CUTANEOUS | 0 refills | Status: DC

## 2018-06-05 MED ORDER — SODIUM CHLORIDE 0.9 % IV SOLN
200.0000 mg | Freq: Once | INTRAVENOUS | Status: AC
  Administered 2018-06-05: 200 mg via INTRAVENOUS
  Filled 2018-06-05: qty 8

## 2018-06-05 NOTE — Progress Notes (Signed)
Per Dr. Alen Blew it is ok to treat today with creatinine of 1.80

## 2018-06-05 NOTE — Patient Instructions (Signed)
[facility] Discharge Instructions for Patients Receiving Chemotherapy  Today you received the following chemotherapy agents:  Keytruda.  To help prevent nausea and vomiting after your treatment, we encourage you to take your nausea medication as directed.   If you develop nausea and vomiting that is not controlled by your nausea medication, call the clinic.   BELOW ARE SYMPTOMS THAT SHOULD BE REPORTED IMMEDIATELY:  *FEVER GREATER THAN 100.5 F  *CHILLS WITH OR WITHOUT FEVER  NAUSEA AND VOMITING THAT IS NOT CONTROLLED WITH YOUR NAUSEA MEDICATION  *UNUSUAL SHORTNESS OF BREATH  *UNUSUAL BRUISING OR BLEEDING  TENDERNESS IN MOUTH AND THROAT WITH OR WITHOUT PRESENCE OF ULCERS  *URINARY PROBLEMS  *BOWEL PROBLEMS  UNUSUAL RASH Items with * indicate a potential emergency and should be followed up as soon as possible.  Feel free to call the clinic should you have any questions or concerns. The clinic phone number is [phone].  Please show the CHEMO ALERT CARD at check-in to the Emergency Department and triage nurse.    

## 2018-06-05 NOTE — Progress Notes (Signed)
Hematology and Oncology Follow Up Visit  Andrew Joyce [id number] [birth year] 58 y.o. 06/05/2018 11:38 AM Gayla Medicus, Delphia Grates, NPShambley, Delphia Grates, NP   Principle Diagnosis: 58 year old man with stage stage IV bladder cancer diagnosed in 2019.  He was found to have high-grade urothelial carcinoma with with pelvic lymph nodes. Prior Therapy:  He is status post TURBT on July 23, 2017 which showed high-grade urothelial carcinoma with sarcomatoid feature.  Systemic chemotherapy utilizing gemcitabine and cisplatin cycle 1 started on September 16, 2017.  He completed 3 cycles of therapy in October 2019.  He is status post definitive therapy with radiation completed to the pelvis on March 03, 2018.  Current therapy: Pembrolizumab 200 mg every 3 weeks started on April 23, 2018.  He is here for evaluation prior to cycle 3 of therapy.    Interim History: Andrew Joyce returns for a follow-up visit.  Since the last visit, he reports no major changes in his health.  He tolerated Pembrolizumab without any major complaints.  He does report some periodic itching and did report one area of erythema on his thigh.  He continues to attend activities of daily living without any further decline.  His performance status and quality of life remains unchanged.  His pain is manageable with adjusting of his current medication.  He is currently on fentanyl 30 mcg every 3 days with oxycodone 30 mg every 6 hours as needed.  He is reporting some issues with constipation and has not had a bowel movement 2 days.  Patient denied any alteration mental status, neuropathy, confusion or dizziness.  Denies any headaches or lethargy.  Denies any night sweats, weight loss or changes in appetite.  Denied orthopnea, dyspnea on exertion or chest discomfort.  Denies shortness of breath, difficulty breathing hemoptysis or cough.  Denies any abdominal distention, nausea, early satiety or dyspepsia.  Denies any hematuria,  frequency, dysuria or nocturia.  Denies any skin irritation, dryness or rash.  Denies any ecchymosis or petechiae.  Denies any lymphadenopathy or clotting.  Denies any heat or cold intolerance.  Denies any anxiety or depression.  Remaining review of system is negative.         Medications: I have reviewed the patient's current medications.  Current Outpatient Medications  Medication Sig Dispense Refill  . allopurinol (ZYLOPRIM) 100 MG tablet Take 1 tablet (100 mg total) by mouth daily. 30 tablet 6  . amLODipine (NORVASC) 5 MG tablet Take 1 tablet (5 mg total) by mouth daily. 30 tablet 6  . baclofen (LIORESAL) 10 MG tablet TAKE 2 TABLETS BY MOUTH THREE TIMES DAILY (Patient taking differently: Take 20 mg by mouth 3 (three) times daily. ) 180 each 5  . carbamazepine (CARBATROL) 100 MG 12 hr capsule Take 1 capsule (100 mg total) by mouth 2 (two) times daily. 180 capsule 3  . DULoxetine (CYMBALTA) 20 MG capsule Take 20 mg by mouth at bedtime. For Sleep Aide  2  . fenofibrate 160 MG tablet Take 1 tablet by mouth once daily 90 tablet 0  . fentaNYL (DURAGESIC - DOSED MCG/HR) 12 MCG/HR Place 1 patch onto the skin every 3 (three) days.     [name] glucose blood (ACCU-CHEK GUIDE) test strip Use to check blood sugars 2 times daily. Dx Code: E11.9 100 each 12  . isosorbide mononitrate (IMDUR) 60 MG 24 hr tablet TAKE 1 & 1/2 (ONE & ONE-HALF) TABLETS BY MOUTH ONCE DAILY (Patient taking differently: Take 60 mg by mouth daily. ) 135 tablet 2  .  levothyroxine (SYNTHROID, LEVOTHROID) 25 MCG tablet Take 1 tablet (25 mcg total) by mouth daily. 30 tablet 1  . meloxicam (MOBIC) 15 MG tablet Take 1 tablet (15 mg total) by mouth daily. 30 tablet 1  . methenamine (MANDELAMINE) 1 g tablet Take 1,000 mg by mouth 3 (three) times daily.     . nitroGLYCERIN (NITROSTAT) 0.4 MG SL tablet Place 1 tablet (0.4 mg total) under the tongue every 5 (five) minutes as needed. For chest pain 25 tablet 3  . Oxycodone HCl 10 MG TABS Take 10  mg by mouth every 6 (six) hours as needed (pain).     [name] rOPINIRole (REQUIP) 0.25 MG tablet Take 1 tablet (0.25 mg total) by mouth at bedtime. TAKE 1 TABLET BY MOUTH 3 HOURS BEFORE BEDTIME 90 tablet 3  . rosuvastatin (CRESTOR) 40 MG tablet Take 1 tablet (40 mg total) by mouth daily. 30 tablet 4  . sitaGLIPtin (JANUVIA) 50 MG tablet Take 1 tablet (50 mg total) by mouth daily. 90 tablet 0  . sodium chloride flush 0.9 % SOLN injection Flush 10 mL of saline into each nephrostomy tube daily.     No current facility-administered medications for this visit.      Allergies: No Known Allergies  Past Medical History, Surgical history, Social history, and Family History were reviewed and updated.    Physical Exam:  Blood pressure 104/71, pulse 94, temperature 99.1 F (37.3 C), temperature source Oral, resp. rate 18, height 5\' 10"  (1.778 m), weight 136 lb 9.6 oz (62 kg), SpO2 98 %.     ECOG: 0   General appearance: Alert, awake without any distress. Head: Atraumatic without abnormalities Oropharynx: Without any thrush or ulcers. Eyes: No scleral icterus. Lymph nodes: No lymphadenopathy noted in the cervical, supraclavicular, or axillary nodes Heart:regular rate and rhythm, without any murmurs or gallops.   Lung: Clear to auscultation without any rhonchi, wheezes or dullness to percussion. Abdomin: Soft, nontender without any shifting dullness or ascites. Musculoskeletal: No clubbing or cyanosis. Neurological: No motor or sensory deficits. Skin: Mild erythema noted on the inner aspect of his right thigh.  No other skin rashes or lesions.        Lab Results: Lab Results  Component Value Date   WBC 6.0 05/15/2018   HGB 8.8 (L) 05/15/2018   HCT 28.5 (L) 05/15/2018   MCV 92.2 05/15/2018   PLT 229 05/15/2018     Chemistry      Component Value Date/Time   NA 137 05/15/2018 1310   NA 141 07/24/2017 0942   K 4.4 05/15/2018 1310   CL 101 05/15/2018 1310   CO2 27 05/15/2018 1310    BUN 19 05/15/2018 1310   BUN 35 (H) 07/24/2017 0942   CREATININE 1.42 (H) 05/15/2018 1310   CREATININE 1.38 (H) 03/01/2015 0942      Component Value Date/Time   CALCIUM 9.8 05/15/2018 1310   ALKPHOS 68 05/15/2018 1310   AST 11 (L) 05/15/2018 1310   ALT 8 05/15/2018 1310   BILITOT 0.2 (L) 05/15/2018 1310        Impression and Plan:  58 year old man with:  1.  Stage IV high-grade urothelial carcinoma of the bladder with pelvic adenopathy noted in July 2019.    He continues to tolerate Pembrolizumab without any major complications.  Risks and benefits of continuing this therapy was reviewed today.  Potential long-term complications were reiterated.  Plan is to place 4 cycles of therapy and repeat imaging studies.  He is agreeable  to proceed with this plan.    2.  IV access: Port-A-Cath will be flushed periodically.  3.  Antiemetics: Antiemetics are available to him without any recent nausea or vomiting.  4.  Renal insufficiency: Related to obstructive uropathy with kidney function remains stable.  Nephrostomy tube remains in place.  5.  Goals of care: His performance status remains adequate and aggressive therapy is warranted.  His disease is incurable and therapy remains palliative.  6.  Anemia: Related to renal insufficiency, malignancy and anticancer treatment.  Hemoglobin is stable.  7.  Pain: Manageable at this time with fentanyl patch and breakthrough oxycodone.  He is experiencing some constipation and we have discussed strategies to improve that.  I will prescribe him Senokot-S to take on a daily basis and to use a laxative if he does not have a bowel movement 3 days.  8.  Immune mediated complications: He is experiencing some mild rash and will be prescribed topical steroids.  I reiterated other complications and include pneumonitis, colitis and thyroid disease.  9.  Follow-up: We will be in 3 weeks for the next cycle of therapy.  25 minutes was spent with the  patient face-to-face today.  More than 50% of time was dedicated to reviewing his disease status, treatment options and complications related to therapy.    Zola Button, MD 5/29/202011:38 AM

## 2018-06-08 ENCOUNTER — Telehealth: Payer: Self-pay

## 2018-06-08 NOTE — Telephone Encounter (Signed)
Received call from patient stating that the pharmacy has not received the prescription for Senna-S. Contacted pharmacy and explained that we have confirmed receipt of prescription. Pharmacy could not locate prescription so verbal order was provided for the prescription. Contacted patient and made him aware that pharmacy now has the prescription.

## 2018-06-09 ENCOUNTER — Other Ambulatory Visit (HOSPITAL_COMMUNITY): Payer: Self-pay | Admitting: Diagnostic Radiology

## 2018-06-09 ENCOUNTER — Ambulatory Visit (HOSPITAL_COMMUNITY)
Admission: RE | Admit: 2018-06-09 | Discharge: 2018-06-09 | Disposition: A | Discharge status: Home / Self Care | Payer: Medicare Other | Source: Ambulatory Visit | Attending: Diagnostic Radiology | Admitting: Diagnostic Radiology

## 2018-06-09 ENCOUNTER — Encounter (HOSPITAL_COMMUNITY): Payer: Self-pay | Admitting: Interventional Radiology

## 2018-06-09 ENCOUNTER — Other Ambulatory Visit: Payer: Self-pay

## 2018-06-09 ENCOUNTER — Other Ambulatory Visit (HOSPITAL_COMMUNITY): Payer: Self-pay | Admitting: Interventional Radiology

## 2018-06-09 DIAGNOSIS — Z436 Encounter for attention to other artificial openings of urinary tract: Secondary | ICD-10-CM | POA: Diagnosis not present

## 2018-06-09 DIAGNOSIS — N1330 Unspecified hydronephrosis: Secondary | ICD-10-CM

## 2018-06-09 DIAGNOSIS — T83092A Other mechanical complication of nephrostomy catheter, initial encounter: Secondary | ICD-10-CM | POA: Diagnosis not present

## 2018-06-09 HISTORY — PX: IR NEPHROSTOMY EXCHANGE LEFT: IMG6069

## 2018-06-09 HISTORY — PX: IR NEPHROSTOMY EXCHANGE RIGHT: IMG6070

## 2018-06-09 MED ORDER — IOHEXOL 300 MG/ML  SOLN
50.0000 mL | Freq: Once | INTRAMUSCULAR | Status: AC | PRN
  Administered 2018-06-09: 15 mL

## 2018-06-09 MED ORDER — LIDOCAINE HCL 1 % IJ SOLN
INTRAMUSCULAR | Status: AC
  Filled 2018-06-09: qty 20

## 2018-06-09 NOTE — Procedures (Signed)
Pre Procedure Dx: Hydronephrosis Post Procedure Dx: Same  Successful left sided exchange and conversion to standard 12 Fr PCN. Successful right sided exchange and upsizing to 12 Fr PCN  EBL: None   No immediate complications.   Ronny Bacon, MD Pager #: [phone]

## 2018-06-10 ENCOUNTER — Other Ambulatory Visit: Payer: Self-pay

## 2018-06-10 ENCOUNTER — Emergency Department (HOSPITAL_COMMUNITY)
Admission: EM | Admit: 2018-06-10 | Discharge: 2018-06-10 | Disposition: A | Discharge status: Home / Self Care | Payer: Medicare Other | Attending: Emergency Medicine | Admitting: Emergency Medicine

## 2018-06-10 ENCOUNTER — Encounter (HOSPITAL_COMMUNITY): Payer: Self-pay | Admitting: Emergency Medicine

## 2018-06-10 DIAGNOSIS — I2510 Atherosclerotic heart disease of native coronary artery without angina pectoris: Secondary | ICD-10-CM | POA: Insufficient documentation

## 2018-06-10 DIAGNOSIS — I129 Hypertensive chronic kidney disease with stage 1 through stage 4 chronic kidney disease, or unspecified chronic kidney disease: Secondary | ICD-10-CM | POA: Diagnosis not present

## 2018-06-10 DIAGNOSIS — G253 Myoclonus: Secondary | ICD-10-CM | POA: Insufficient documentation

## 2018-06-10 DIAGNOSIS — N183 Chronic kidney disease, stage 3 (moderate): Secondary | ICD-10-CM | POA: Diagnosis not present

## 2018-06-10 DIAGNOSIS — Z8551 Personal history of malignant neoplasm of bladder: Secondary | ICD-10-CM | POA: Insufficient documentation

## 2018-06-10 DIAGNOSIS — I252 Old myocardial infarction: Secondary | ICD-10-CM | POA: Diagnosis not present

## 2018-06-10 DIAGNOSIS — E1122 Type 2 diabetes mellitus with diabetic chronic kidney disease: Secondary | ICD-10-CM | POA: Diagnosis not present

## 2018-06-10 DIAGNOSIS — Z951 Presence of aortocoronary bypass graft: Secondary | ICD-10-CM | POA: Diagnosis not present

## 2018-06-10 DIAGNOSIS — Z79899 Other long term (current) drug therapy: Secondary | ICD-10-CM | POA: Insufficient documentation

## 2018-06-10 DIAGNOSIS — M62838 Other muscle spasm: Secondary | ICD-10-CM | POA: Diagnosis present

## 2018-06-10 HISTORY — DX: Malignant (primary) neoplasm, unspecified: C80.1

## 2018-06-10 LAB — CBC WITH DIFFERENTIAL/PLATELET
Abs Immature Granulocytes: 0.05 10*3/uL (ref 0.00–0.07)
Basophils Absolute: 0 10*3/uL (ref 0.0–0.1)
Basophils Relative: 0 %
Eosinophils Absolute: 0.1 10*3/uL (ref 0.0–0.5)
Eosinophils Relative: 1 %
HCT: 29.1 % — ABNORMAL LOW (ref 39.0–52.0)
Hemoglobin: 8.8 g/dL — ABNORMAL LOW (ref 13.0–17.0)
Immature Granulocytes: 1 %
Lymphocytes Relative: 4 %
Lymphs Abs: 0.3 10*3/uL — ABNORMAL LOW (ref 0.7–4.0)
MCH: 28.8 pg (ref 26.0–34.0)
MCHC: 30.2 g/dL (ref 30.0–36.0)
MCV: 95.1 fL (ref 80.0–100.0)
Monocytes Absolute: 0.5 10*3/uL (ref 0.1–1.0)
Monocytes Relative: 6 %
Neutro Abs: 7.5 10*3/uL (ref 1.7–7.7)
Neutrophils Relative %: 88 %
Platelets: 313 10*3/uL (ref 150–400)
RBC: 3.06 MIL/uL — ABNORMAL LOW (ref 4.22–5.81)
RDW: 14.9 % (ref 11.5–15.5)
WBC: 8.4 10*3/uL (ref 4.0–10.5)
nRBC: 0 % (ref 0.0–0.2)

## 2018-06-10 LAB — COMPREHENSIVE METABOLIC PANEL
ALT: 12 U/L (ref 0–44)
AST: 15 U/L (ref 15–41)
Albumin: 3 g/dL — ABNORMAL LOW (ref 3.5–5.0)
Alkaline Phosphatase: 68 U/L (ref 38–126)
Anion gap: 10 (ref 5–15)
BUN: 27 mg/dL — ABNORMAL HIGH (ref 6–20)
CO2: 26 mmol/L (ref 22–32)
Calcium: 9.8 mg/dL (ref 8.9–10.3)
Chloride: 102 mmol/L (ref 98–111)
Creatinine, Ser: 1.52 mg/dL — ABNORMAL HIGH (ref 0.61–1.24)
GFR calc Af Amer: 57 mL/min — ABNORMAL LOW (ref >60)
GFR calc non Af Amer: 49 mL/min — ABNORMAL LOW (ref >60)
Glucose, Bld: 173 mg/dL — ABNORMAL HIGH (ref 70–99)
Potassium: 4.4 mmol/L (ref 3.5–5.1)
Sodium: 138 mmol/L (ref 135–145)
Total Bilirubin: 0.3 mg/dL (ref 0.3–1.2)
Total Protein: 8.1 g/dL (ref 6.5–8.1)

## 2018-06-10 LAB — PHOSPHORUS: Phosphorus: 3.6 mg/dL (ref 2.5–4.6)

## 2018-06-10 LAB — MAGNESIUM: Magnesium: 2.3 mg/dL (ref 1.7–2.4)

## 2018-06-10 LAB — CK: Total CK: 20 U/L — ABNORMAL LOW (ref 49–397)

## 2018-06-10 MED ORDER — DIPHENHYDRAMINE HCL 50 MG/ML IJ SOLN
25.0000 mg | Freq: Once | INTRAMUSCULAR | Status: AC
  Administered 2018-06-10: 14:00:00 25 mg via INTRAVENOUS
  Filled 2018-06-10: qty 1

## 2018-06-10 NOTE — ED Notes (Signed)
ED Provider at bedside. 

## 2018-06-10 NOTE — Consult Note (Signed)
[facility] TeleNeurology Consult Services  Stat Consult  Date of Service:   06/10/2018 13:50:05  Impression:     .  Myoclonus     .  Cramp-fasciulation syndrome     .  Stage IV Bladder Cancer  Comments/Sign-Out: 58 yo M with hx advanced bladder cancer and cramp-fasciulation syndrome, now with recurrence of myoclonic jerks in past day. Most likely combination of medication effect in setting of cancer, and possible paraneoplastic syndrome. As he has a neurologist advised touching base to see if he can increase his baclofen dose.   Advised him it is a q8 dosing medication and evening this out through the day may help with daytime spasms and jerks.  Consider minimizing opiate intake as he has had his oxycontin titrated but also has fentanyl patch  Furthermore if he hasn't been ruled out for paraneoplastic syndrome this should be considered, but defer to his neurologist  CT HEAD: Showed No Acute Hemorrhage or Acute Core Infarct  Metrics: [facility] Notification Time: 06/10/2018 13:46:22 Stamp Time: 06/10/2018 13:50:05 Callback Response Time: 06/10/2018 13:56:47 Video Start Time: 06/10/2018 14:36:15 Video End Time: 06/10/2018 14:47:48  Our recommendations are outlined below.   Disposition: Sign Off  Sign Out:     .  Discussed with Emergency Department Provider  ----------------------------------------------------------------------------------------------------  Chief Complaint: spasms  History of Present Illness: Patient is a 58 year old Male.  58 yo M who has had history of spasms and sees a neurologist for RLS, cramp-fasciculation syndrome (tegretol 100 BID and baclofen 30 BID). He started having some myoclonic jerks a month ago so Tegretol was reduced to 100 BID from 200 BID   In the past 24h hr he has started having the myoclonic jerks again. They seem to be in setting of action, not at rest, and symmetric bilaterally in arms and legs. There  are no cramps or pain. He cannot suppress them.   On exam, nonfocal but with arms outstretched he does have asterixis and occasional myoclonus at his wrists.  Examination: Neuro Exam: General: Alert,Awake, Oriented to Time, Place, Person  Speech: Fluent:  Language: Intact:  Face: Symmetric:  Facial Sensation: Intact:  Visual Fields: Intact:  Extraocular Movements: Intact:  Motor Exam: No Drift:  Coordination: Intact: Myoclonic jerks in both upper extremities, and asterixis at elbows and wrists with arms outstretched  Patient/Family was informed the Neurology Consult would happen via TeleHealth consult by way of interactive audio and video telecommunications and consented to receiving care in this manner.  Due to the immediate potential for life-threatening deterioration due to underlying acute neurologic illness, I spent 35 minutes providing critical care. This time includes time for face to face visit via telemedicine, review of medical records, imaging studies and discussion of findings with providers, the patient and/or family.   Dr Leda Quail   [facility] [phone]   Case [id number]

## 2018-06-10 NOTE — Discharge Instructions (Signed)
We saw in the ER for your spasms and sleepiness.  The neurologist recommends that you increase your baclofen to 3 times a day.  That will probably help you with your spasms.  He wants you to follow-up with your neurologist in 5 days.  In addition, we would like you to reduce your fentanyl past usage or stop it completely if you are able to tolerate the pain.  Your oxycodone use has gone up gradually and we think that there are pain medications, muscle relaxants are all contributing at you having the sleepiness.  Please see the neurologist in 5 days and make sure these changes are made so that they can further help you.

## 2018-06-10 NOTE — ED Triage Notes (Signed)
Pt reports having "twitching" since yesterday. Reports its all over his entire body.

## 2018-06-10 NOTE — ED Provider Notes (Addendum)
[facility] DEPT Provider Note   CSN: [id number] Arrival date & time: 06/10/18  1037    History   Chief Complaint Chief Complaint  Patient presents with   Spasms    HPI Andrew Joyce is a 58 y.o. male.     HPI  58 year old male comes in a chief complaint of spasm.  Patient has history of bladder cancer, with bilateral nephrostomy tube, coronary artery disease.  He reports that over the past 2 days he has been having increased episodes of sleepiness.  He is also having spasms and shaking.  Patient denies any new medications.  He has no history of stroke, neuromuscular disease.  There is no family history of neuromuscular conditions.  There is no associated numbness, tingling.  Past Medical History:  Diagnosis Date   Bladder exstrophy     Congenital- had multiple surgeries as a child   Cancer ([place])    Chronic kidney disease (CKD), stage II (mild)    Congenital birth defect    Coronary artery disease    a. s/p CABG in 2008; b.  s/p cath in 2011 for med rx.;  c.  NSTEMI (02/2011):  LHC (02/2011):  LAD occluded, distal LAD filled via left to left collaterals, distal CFX 40-50%, proximal RCA occluded (right to right collaterals), distal RCA occluded, S-RCA occluded proximally, S-OM1/OM2 occluded (new from 2011 - culprit), L-LAD ok, ant apical HK, EF 45-50% - med Rx.   Dyslipidemia    History of renal stone    Hx of cardiovascular stress test    Nuclear (06/2009):  EF 71%, small area of infarct/ischemia in inferior wall-unchanged from prior   Hx of echocardiogram    Echo (05/2013):  EF 55-60%, inf HK, mild AI, normal RVSF, RVSP 28 mmHg   Hypertension    Pancreatitis    Recurrent UTI     Patient Active Problem List   Diagnosis Date Noted   Sepsis ([place]) 03/22/2018   Anemia 03/22/2018   Type 2 diabetes mellitus ([place]) 03/22/2018   Pyelonephritis 03/21/2018   Hydronephrosis 03/21/2018   Insomnia 02/25/2018   Port-A-Cath in  place 09/23/2017   Malignant neoplasm of urinary bladder ([place]) 09/04/2017   Diabetes mellitus ([place]) 08/08/2017   Goals of care, counseling/discussion 01/21/2017   Benign fasciculation-cramp syndrome 08/22/2015   Chronic thoracic back pain 03/20/2015   Fasciculation of lower extremity 11/18/2014   Bilateral thoracic back pain 09/28/2014   Nonallopathic lesion-rib cage 07/14/2014   Nonallopathic lesion of thoracic region 07/14/2014   Nonallopathic lesion of cervical region 07/14/2014   Posture imbalance 04/13/2014   CKD (chronic kidney disease) stage 3, GFR 30-59 ml/min (HCC) 06/01/2013   Angina pectoris ([place]) 09/18/2011   Dyspnea 06/13/2011   NSTEMI (non-ST elevated myocardial infarction) ([place]) 02/27/2011   CAD (coronary artery disease) 02/06/2011   HTN (hypertension) 02/06/2011   Hyperlipidemia 02/06/2011    Past Surgical History:  Procedure Laterality Date   CARDIAC CATHETERIZATION  07/28/2009   EF 60%; [place] patent except for SVG to the AM and PD. Native RCA is occluded.   CARDIAC CATHETERIZATION  03/20/2006   EF 60-65%   CARDIOVASCULAR STRESS TEST  06/12/2009   EF 71%, SMALL AREA OF INFARCT/ISCHEMIA IN INFERIOR WALL; FELT TO NOT BE CHANGED.   CORONARY ARTERY BYPASS GRAFT  03/2006   LIMA GRAFT TO LAD, SAPHENOUS VEIN GRAFT TO THE ACUTE MARGINAL BRANCH THE RIGHT CORONARY, SAPHENOUS VEIN GRAFT TO THE PDA, SEQUENTIAL VEIN GRAFT TO THE FIRST AND SECOND OBTUSE MARGINAL VESSELS  IR IMAGING GUIDED PORT INSERTION  09/19/2017   IR NEPHROSTOMY EXCHANGE LEFT  03/22/2018   IR NEPHROSTOMY EXCHANGE LEFT  05/08/2018   IR NEPHROSTOMY EXCHANGE LEFT  05/21/2018   IR NEPHROSTOMY EXCHANGE LEFT  06/09/2018   IR NEPHROSTOMY EXCHANGE RIGHT  03/22/2018   IR NEPHROSTOMY EXCHANGE RIGHT  05/21/2018   IR NEPHROSTOMY EXCHANGE RIGHT  06/09/2018   IR NEPHROSTOMY TUBE CHANGE  04/22/2018   KIDNEY SURGERY     LEFT HEART CATHETERIZATION WITH CORONARY ANGIOGRAM N/A 02/26/2011   Procedure:  LEFT HEART CATHETERIZATION WITH CORONARY ANGIOGRAM;  Surgeon: Thayer Headings, MD;  Location: [facility] CATH LAB;  Service: Cardiovascular;  Laterality: N/A;        Home Medications    Prior to Admission medications   Medication Sig Start Date End Date Taking? Authorizing Provider  allopurinol (ZYLOPRIM) 100 MG tablet Take 1 tablet (100 mg total) by mouth daily. 08/08/16   Lyndal Pulley, DO  amLODipine (NORVASC) 5 MG tablet Take 1 tablet (5 mg total) by mouth daily. 09/17/13   [place], Peter M, MD  baclofen (LIORESAL) 10 MG tablet TAKE 2 TABLETS BY MOUTH THREE TIMES DAILY Patient taking differently: Take 20 mg by mouth 3 (three) times daily.  02/03/18   Narda Amber K, DO  carbamazepine (CARBATROL) 100 MG 12 hr capsule Take 1 capsule (100 mg total) by mouth 2 (two) times daily. 05/13/18   Patel, Arvin Collard K, DO  DULoxetine (CYMBALTA) 20 MG capsule Take 20 mg by mouth at bedtime. For Sleep Aide 09/17/17   [provider]  fenofibrate 160 MG tablet Take 1 tablet by mouth once daily 05/19/18   [place], Peter M, MD  fentaNYL (DURAGESIC - DOSED MCG/HR) 12 MCG/HR Place 1 patch onto the skin every 3 (three) days.  12/08/17   [provider]  glucose blood (ACCU-CHEK GUIDE) test strip Use to check blood sugars 2 times daily. Dx Code: E11.9 08/11/17   [name], NP  isosorbide mononitrate (IMDUR) 60 MG 24 hr tablet TAKE 1 & 1/2 (ONE & ONE-HALF) TABLETS BY MOUTH ONCE DAILY Patient taking differently: Take 60 mg by mouth daily.  06/11/17   [place], Peter M, MD  levothyroxine (SYNTHROID, LEVOTHROID) 25 MCG tablet Take 1 tablet (25 mcg total) by mouth daily. 08/08/16   Lyndal Pulley, DO  meloxicam (MOBIC) 15 MG tablet Take 1 tablet (15 mg total) by mouth daily. 10/29/16   Gardiner Barefoot, DPM  methenamine (MANDELAMINE) 1 g tablet Take 1,000 mg by mouth 3 (three) times daily.  12/21/15   [provider]  nitroGLYCERIN (NITROSTAT) 0.4 MG SL tablet Place 1 tablet (0.4 mg total) under the  tongue every 5 (five) minutes as needed. For chest pain 06/20/16   [place], Peter M, MD  Oxycodone HCl 10 MG TABS Take 10 mg by mouth every 6 (six) hours as needed (pain).  12/08/17   [provider]  rOPINIRole (REQUIP) 0.25 MG tablet Take 1 tablet (0.25 mg total) by mouth at bedtime. TAKE 1 TABLET BY MOUTH 3 HOURS BEFORE BEDTIME 05/13/18   Patel, Donika K, DO  rosuvastatin (CRESTOR) 40 MG tablet Take 1 tablet (40 mg total) by mouth daily. 03/02/18 05/31/18  [place], Peter M, MD  senna-docusate (SENNA S) 8.6-50 MG tablet Take 1 tablet by mouth daily. 06/05/18   Wyatt Portela, MD  sitaGLIPtin (JANUVIA) 50 MG tablet Take 1 tablet (50 mg total) by mouth daily. 05/05/18   Marrian Salvage, FNP  sodium chloride flush 0.9 %  SOLN injection Flush 10 mL of saline into each nephrostomy tube daily. 11/28/17   [provider]  triamcinolone ointment (KENALOG) 0.5 % Apply 1 application topically 2 (two) times daily. 06/05/18   Wyatt Portela, MD    Family History Family History  Problem Relation Age of Onset   Hyperlipidemia Mother        Living, 62   Hypertension Mother    Hyperlipidemia Father        Living 50   Hypertension Father    Healthy Sister    Healthy Brother     Social History Social History   Tobacco Use   Smoking status: Never Smoker   Smokeless tobacco: Never Used  Substance Use Topics   Alcohol use: No    Alcohol/week: 0.0 standard drinks   Drug use: No     Allergies   Patient has no known allergies.   Review of Systems Review of Systems  Constitutional: Positive for activity change.  Respiratory: Negative for shortness of breath.   Cardiovascular: Negative for chest pain.  Gastrointestinal: Negative for nausea and vomiting.  Neurological: Positive for tremors.  All other systems reviewed and are negative.    Physical Exam Updated Vital Signs BP 118/72    Pulse 73    Temp 98.8 F (37.1 C) (Oral)    Resp 17    SpO2 100%   Physical  Exam Vitals signs and nursing note reviewed.  Constitutional:      Appearance: He is well-developed.  HENT:     Head: Atraumatic.  Neck:     Musculoskeletal: Neck supple.  Cardiovascular:     Rate and Rhythm: Normal rate.  Pulmonary:     Effort: Pulmonary effort is normal.  Skin:    General: Skin is warm.  Neurological:     Mental Status: He is alert and oriented to person, place, and time.      ED Treatments / Results  Labs (all labs ordered are listed, but only abnormal results are displayed) Labs Reviewed  COMPREHENSIVE METABOLIC PANEL - Abnormal; Notable for the following components:      Result Value   Glucose, Bld 173 (*)    BUN 27 (*)    Creatinine, Ser 1.52 (*)    Albumin 3.0 (*)    GFR calc non Af Amer 49 (*)    GFR calc Af Amer 57 (*)    All other components within normal limits  CBC WITH DIFFERENTIAL/PLATELET - Abnormal; Notable for the following components:   RBC 3.06 (*)    Hemoglobin 8.8 (*)    HCT 29.1 (*)    Lymphs Abs 0.3 (*)    All other components within normal limits  CK - Abnormal; Notable for the following components:   Total CK 20 (*)    All other components within normal limits  MAGNESIUM  PHOSPHORUS    EKG None  Radiology Ir Nephrostomy Exchange Left  Result Date: 06/09/2018 INDICATION: History of high-grade urothelial carcinoma with chronic bilateral nephrostomy catheters. Unfortunately, the patient has experienced several episodes of early recurrent occlusion of the left-sided 12 French Dawson Mueller drainage catheter secondary to sedimentation despite the fact that he flushes the nephrostomy 2-3 times per day. As such, patient presents today for fluoroscopic guided exchange/conversion of the left-sided nephrostomy catheter. As he is due for routine exchange of the contralateral right-sided nephrostomy catheter next week, we will also exchange the right-sided nephrostomy catheter today. EXAM: 1. FLUOROSCOPIC GUIDED EXCHANGE OF LEFT-SIDED  12 FRENCH  DAWSON MUELLER CATHETER TO A 12 FRENCH ALL-PURPOSE DRAINAGE CATHETER 2. FLUOROSCOPIC GUIDED EXCHANGE AND UP SIZE OF 10 FRENCH NEPHROSTOMY CATHETER TO A 12 FRENCH NEPHROSTOMY CATHETER. COMPARISON:  Multiple previous fluoroscopic guided exchanges, most recently 05/21/2018 CONTRAST:  A total of 15 mL Omnipaque 300 administered was administered into both collecting systems FLUOROSCOPY TIME:  1 minute, 6 seconds (9 mGy) COMPLICATIONS: None immediate. TECHNIQUE: Informed written consent was obtained from the patient after a discussion of the risks, benefits and alternatives to treatment. Questions regarding the procedure were encouraged and answered. A timeout was performed prior to the initiation of the procedure. The bilateral flanks and external portions of existing nephrostomy catheters were prepped and draped in the usual sterile fashion. A sterile drape was applied covering the operative field. Maximum barrier sterile technique with sterile gowns and gloves were used for the procedure. A timeout was performed prior to the initiation of the procedure. A pre procedural spot fluoroscopic image was obtained. Beginning with the left-sided nephrostomy, a small amount of contrast was injected via the existing left-sided nephrostomy catheter demonstrating appropriate positioning within the renal pelvis. The existing nephrostomy catheter was cut and cannulated with short Amplatz wire which was coiled within the renal pelvis. Under intermittent fluoroscopic guidance, the existing 12 French Bettey Mare type nephrostomy catheter was exchanged for a new 12 [place] standard all-purpose drainage catheter. Note, each pole of all-purpose drainage catheter with slightly widening with the use of a clamp. Limited contrast injection confirmed appropriate positioning within the left renal pelvis and a post exchange fluoroscopic image was obtained. The catheter was locked, secured to the skin with an interrupted suture and  reconnected to a gravity bag. The identical repeat procedure was repeated for the contralateral right-sided nephrostomy, ultimately allowing successful exchange and up sizing to a now 12 [place] all-purpose drainage catheter with end coiled and locked within the right renal pelvis. Dressings were placed. The patient tolerated the above procedures well without immediate postprocedural complication. FINDINGS: The existing nephrostomy catheters are appropriately positioned and functioning. After successful fluoroscopic guided exchange, the new bilateral 12 French nephrostomy catheters are appropriately positioned with end coiled and locked within the bilateral renal pelves. IMPRESSION: 1. Fluoroscopic guided exchange and conversion of left-sided 12 French Bettey Mare type catheter to a standard 12 [place] all-purpose percutaneous nephrostomy catheter. Note, each of the side holes from new left-sided nephrostomy catheter were made slightly larger with the use of a clamp. 2. Fluoroscopic guided exchange and up sizing of now 12 French right-sided percutaneous nephrostomy catheter. Electronically Signed   By: Sandi Mariscal M.D.   On: 06/09/2018 16:23   Ir Nephrostomy Exchange Right  Result Date: 06/09/2018 INDICATION: History of high-grade urothelial carcinoma with chronic bilateral nephrostomy catheters. Unfortunately, the patient has experienced several episodes of early recurrent occlusion of the left-sided 12 French Dawson Mueller drainage catheter secondary to sedimentation despite the fact that he flushes the nephrostomy 2-3 times per day. As such, patient presents today for fluoroscopic guided exchange/conversion of the left-sided nephrostomy catheter. As he is due for routine exchange of the contralateral right-sided nephrostomy catheter next week, we will also exchange the right-sided nephrostomy catheter today. EXAM: 1. FLUOROSCOPIC GUIDED EXCHANGE OF LEFT-SIDED 12 FRENCH DAWSON MUELLER CATHETER TO A 12 FRENCH  ALL-PURPOSE DRAINAGE CATHETER 2. FLUOROSCOPIC GUIDED EXCHANGE AND UP SIZE OF 10 FRENCH NEPHROSTOMY CATHETER TO A 12 FRENCH NEPHROSTOMY CATHETER. COMPARISON:  Multiple previous fluoroscopic guided exchanges, most recently 05/21/2018 CONTRAST:  A total of 15 mL Omnipaque 300 administered was  administered into both collecting systems FLUOROSCOPY TIME:  1 minute, 6 seconds (9 mGy) COMPLICATIONS: None immediate. TECHNIQUE: Informed written consent was obtained from the patient after a discussion of the risks, benefits and alternatives to treatment. Questions regarding the procedure were encouraged and answered. A timeout was performed prior to the initiation of the procedure. The bilateral flanks and external portions of existing nephrostomy catheters were prepped and draped in the usual sterile fashion. A sterile drape was applied covering the operative field. Maximum barrier sterile technique with sterile gowns and gloves were used for the procedure. A timeout was performed prior to the initiation of the procedure. A pre procedural spot fluoroscopic image was obtained. Beginning with the left-sided nephrostomy, a small amount of contrast was injected via the existing left-sided nephrostomy catheter demonstrating appropriate positioning within the renal pelvis. The existing nephrostomy catheter was cut and cannulated with short Amplatz wire which was coiled within the renal pelvis. Under intermittent fluoroscopic guidance, the existing 12 French Bettey Mare type nephrostomy catheter was exchanged for a new 12 [place] standard all-purpose drainage catheter. Note, each pole of all-purpose drainage catheter with slightly widening with the use of a clamp. Limited contrast injection confirmed appropriate positioning within the left renal pelvis and a post exchange fluoroscopic image was obtained. The catheter was locked, secured to the skin with an interrupted suture and reconnected to a gravity bag. The identical repeat  procedure was repeated for the contralateral right-sided nephrostomy, ultimately allowing successful exchange and up sizing to a now 12 [place] all-purpose drainage catheter with end coiled and locked within the right renal pelvis. Dressings were placed. The patient tolerated the above procedures well without immediate postprocedural complication. FINDINGS: The existing nephrostomy catheters are appropriately positioned and functioning. After successful fluoroscopic guided exchange, the new bilateral 12 French nephrostomy catheters are appropriately positioned with end coiled and locked within the bilateral renal pelves. IMPRESSION: 1. Fluoroscopic guided exchange and conversion of left-sided 12 French Bettey Mare type catheter to a standard 12 [place] all-purpose percutaneous nephrostomy catheter. Note, each of the side holes from new left-sided nephrostomy catheter were made slightly larger with the use of a clamp. 2. Fluoroscopic guided exchange and up sizing of now 12 French right-sided percutaneous nephrostomy catheter. Electronically Signed   By: Sandi Mariscal M.D.   On: 06/09/2018 16:23    Procedures Procedures (including critical care time)  Medications Ordered in ED Medications  diphenhydrAMINE (BENADRYL) injection 25 mg (25 mg Intravenous Given 06/10/18 1416)     Initial Impression / Assessment and Plan / ED Course  I have reviewed the triage vital signs and the nursing notes.  Pertinent labs & imaging results that were available during my care of the patient were reviewed by me and considered in my medical decision making (see chart for details).  Clinical Course as of Jun 09 1498  Wed Jun 10, 2018  1459 Neurology team called and advised that patient increase his baclofen to 3 times daily as prescribed.  He also wants him to reduce his fentanyl usage.  They suspect right now that patient is having myoclonic jerking.  He has multiple reasons for him to have the jerking and the sleepiness,  with medication side effects as a primary cause.  If the medication change does not help then patient might need work-up for paraneoplastic syndrome as well.  I informed patient's of the recommendations from neurology and we have advised him to follow-up with his own neurologist in 5 days.   [AN]  Clinical Course User Index [AN] Varney Biles, MD       58 year old male with history of bladder cancer comes in a chief complaint of tremors and falling asleep.  He is on pain medications and also psychiatry of medications.  He has no history of movement disorder or seizures.  He has been having episodes of just falling asleep while watching TV or conversing.  Symptoms started yesterday.  He is also noted tremors.  The tremors appear to be action tremor.  There is also some rigidity when I tried to flex the upper extremities.  Differential diagnosis would include dystonia, functional tremor, essential tremor, medication side effects.  We will get basic labs to ensure that there is no new renal failure or electrolyte abnormalities which is contributing to the symptoms. We will give him Benadryl and call telemetry neurology if the labs are reassuring.  Final Clinical Impressions(s) / ED Diagnoses   Final diagnoses:  Myoclonic jerking while sleeping    ED Discharge Orders    None           Varney Biles, MD 06/10/18 1500

## 2018-06-11 ENCOUNTER — Telehealth: Payer: Self-pay | Admitting: Neurology

## 2018-06-11 NOTE — Telephone Encounter (Signed)
OK to add on 6/15.

## 2018-06-11 NOTE — Telephone Encounter (Signed)
Patient called in regarding being seen at the ED. He said that he was having Spasms and jerking movements. He was advised to see his Neurologist within a week. Please Advise. Thanks

## 2018-06-18 ENCOUNTER — Other Ambulatory Visit (HOSPITAL_COMMUNITY): Payer: Medicare Other

## 2018-06-18 NOTE — Progress Notes (Signed)
   Virtual Visit via Video Note The purpose of this virtual visit is to provide medical care while limiting exposure to the novel coronavirus.    Consent was obtained for video visit:  Yes.   Answered questions that patient had about telehealth interaction:  Yes.   I discussed the limitations, risks, security and privacy concerns of performing an evaluation and management service by telemedicine. I also discussed with the patient that there may be a patient responsible charge related to this service. The patient expressed understanding and agreed to proceed.  Pt location: Home Physician Location: office Name of referring provider:  [name], NP I connected with Andrew Joyce at patients initiation/request on 06/19/2018 at 10:50 AM EDT by video enabled telemedicine application and verified that I am speaking with the correct person using two identifiers. Pt MRN:  [id number] Pt DOB:  [birth year] Video Participants:  Andrew Joyce   History of Present Illness: This is a 58 y.o. male with stage IV bladder cancer (2019) returning for sooner follow-up due to myoclonic jerks and cramps.  He was evaluated in the ER on 6/3 due to worsening uncontrollable muscke jerks.  Laboratory testing did not show any changes in electrolytes.  He was evaluated by tele-neurology who advised follow-up with me.  Patient reports stopping his medications earlier this week for 1 day, after which his symptoms completely resolved.  He is no longer on Cymbalta, fentanyl, Synthroid or Norvasc.  He did restart baclofen 20 mg 3 times daily and ropinirole 0.25 mg at bedtime.  He has not had any further spells of muscle twitches or jerks.  He has lost about 40 pounds over the past few months.  He continues to undergo chemotherapy with pembrolizumab for bladder cancer.   Observations/Objective:   Vitals:   06/19/18 0824  Weight: 140 lb (63.5 kg)  Height: 5\' 10"  (1.778 m)   Patient is appears thin.  He  is awake, alert, and appears comfortable.  Oriented x 4.   Extraocular muscles are intact. No ptosis.  Face is symmetric.  Speech is not dysarthric.  Antigravity in all extremities.  No pronator drift.  No abnormal movements.  Gait appears normal.  He has indwelling foley catheter.   Assessment and Plan:    1.  Myoclonic jerks, possibly side effect of medication vs paraneoplastic.  Myoclonus is not common with pembrolizumab, only one case that I can find where it has been reported.    - Stop carbamazepine  - Check MRI brain wwo contrast  - Consider paraneoplastic panel going forward   2.  Restless leg syndrome, well-controlled  - Refills provided for ropinirole 0.25mg  at bedtime  3.  Cramp-fasciculation syndrome, stable  - Continue baclofen 20mg  TID   Follow Up Instructions:   I discussed the assessment and treatment plan with the patient. The patient was provided an opportunity to ask questions and all were answered. The patient agreed with the plan and demonstrated an understanding of the instructions.   The patient was advised to call back or seek an in-person evaluation if the symptoms worsen or if the condition fails to improve as anticipated.  Further recommendations pending results.   Total time spent:  25 minutes     Alda Berthold, DO

## 2018-06-19 ENCOUNTER — Telehealth (INDEPENDENT_AMBULATORY_CARE_PROVIDER_SITE_OTHER): Payer: Medicare Other | Admitting: Neurology

## 2018-06-19 ENCOUNTER — Encounter: Payer: Self-pay | Admitting: Neurology

## 2018-06-19 ENCOUNTER — Other Ambulatory Visit: Payer: Self-pay

## 2018-06-19 VITALS — Ht 70.0 in | Wt 140.0 lb

## 2018-06-19 DIAGNOSIS — C679 Malignant neoplasm of bladder, unspecified: Secondary | ICD-10-CM | POA: Diagnosis not present

## 2018-06-19 DIAGNOSIS — G2581 Restless legs syndrome: Secondary | ICD-10-CM | POA: Diagnosis not present

## 2018-06-19 DIAGNOSIS — R253 Fasciculation: Secondary | ICD-10-CM

## 2018-06-19 DIAGNOSIS — G253 Myoclonus: Secondary | ICD-10-CM

## 2018-06-22 ENCOUNTER — Ambulatory Visit: Payer: Medicare Other | Admitting: Neurology

## 2018-06-26 ENCOUNTER — Inpatient Hospital Stay: Payer: Medicare Other | Attending: Oncology

## 2018-06-26 ENCOUNTER — Inpatient Hospital Stay: Payer: Medicare Other

## 2018-06-26 ENCOUNTER — Other Ambulatory Visit: Payer: Self-pay

## 2018-06-26 ENCOUNTER — Telehealth: Payer: Self-pay | Admitting: *Deleted

## 2018-06-26 ENCOUNTER — Telehealth: Payer: Self-pay | Admitting: Oncology

## 2018-06-26 ENCOUNTER — Inpatient Hospital Stay (HOSPITAL_BASED_OUTPATIENT_CLINIC_OR_DEPARTMENT_OTHER): Payer: Medicare Other | Admitting: Oncology

## 2018-06-26 VITALS — BP 93/61 | HR 98 | Temp 98.3°F | Resp 18 | Ht 70.0 in | Wt 134.8 lb

## 2018-06-26 DIAGNOSIS — D649 Anemia, unspecified: Secondary | ICD-10-CM

## 2018-06-26 DIAGNOSIS — Z791 Long term (current) use of non-steroidal anti-inflammatories (NSAID): Secondary | ICD-10-CM | POA: Insufficient documentation

## 2018-06-26 DIAGNOSIS — C679 Malignant neoplasm of bladder, unspecified: Secondary | ICD-10-CM

## 2018-06-26 DIAGNOSIS — N289 Disorder of kidney and ureter, unspecified: Secondary | ICD-10-CM | POA: Insufficient documentation

## 2018-06-26 DIAGNOSIS — C775 Secondary and unspecified malignant neoplasm of intrapelvic lymph nodes: Secondary | ICD-10-CM | POA: Insufficient documentation

## 2018-06-26 DIAGNOSIS — Z5112 Encounter for antineoplastic immunotherapy: Secondary | ICD-10-CM

## 2018-06-26 DIAGNOSIS — Z79899 Other long term (current) drug therapy: Secondary | ICD-10-CM | POA: Insufficient documentation

## 2018-06-26 DIAGNOSIS — Z95828 Presence of other vascular implants and grafts: Secondary | ICD-10-CM

## 2018-06-26 LAB — CBC WITH DIFFERENTIAL (CANCER CENTER ONLY)
Abs Immature Granulocytes: 0.05 10*3/uL (ref 0.00–0.07)
Basophils Absolute: 0 10*3/uL (ref 0.0–0.1)
Basophils Relative: 0 %
Eosinophils Absolute: 0.3 10*3/uL (ref 0.0–0.5)
Eosinophils Relative: 3 %
HCT: 22.8 % — ABNORMAL LOW (ref 39.0–52.0)
Hemoglobin: 6.9 g/dL — CL (ref 13.0–17.0)
Immature Granulocytes: 1 %
Lymphocytes Relative: 3 %
Lymphs Abs: 0.3 10*3/uL — ABNORMAL LOW (ref 0.7–4.0)
MCH: 28.6 pg (ref 26.0–34.0)
MCHC: 30.3 g/dL (ref 30.0–36.0)
MCV: 94.6 fL (ref 80.0–100.0)
Monocytes Absolute: 0.7 10*3/uL (ref 0.1–1.0)
Monocytes Relative: 7 %
Neutro Abs: 7.7 10*3/uL (ref 1.7–7.7)
Neutrophils Relative %: 86 %
Platelet Count: 295 10*3/uL (ref 150–400)
RBC: 2.41 MIL/uL — ABNORMAL LOW (ref 4.22–5.81)
RDW: 15.1 % (ref 11.5–15.5)
WBC Count: 9 10*3/uL (ref 4.0–10.5)
nRBC: 0 % (ref 0.0–0.2)

## 2018-06-26 LAB — CMP (CANCER CENTER ONLY)
ALT: 9 U/L (ref 0–44)
AST: 13 U/L — ABNORMAL LOW (ref 15–41)
Albumin: 2.4 g/dL — ABNORMAL LOW (ref 3.5–5.0)
Alkaline Phosphatase: 70 U/L (ref 38–126)
Anion gap: 10 (ref 5–15)
BUN: 25 mg/dL — ABNORMAL HIGH (ref 6–20)
CO2: 24 mmol/L (ref 22–32)
Calcium: 9.7 mg/dL (ref 8.9–10.3)
Chloride: 103 mmol/L (ref 98–111)
Creatinine: 1.99 mg/dL — ABNORMAL HIGH (ref 0.61–1.24)
GFR, Est AFR Am: 41 mL/min — ABNORMAL LOW (ref >60)
GFR, Estimated: 36 mL/min — ABNORMAL LOW (ref >60)
Glucose, Bld: 204 mg/dL — ABNORMAL HIGH (ref 70–99)
Potassium: 4.3 mmol/L (ref 3.5–5.1)
Sodium: 137 mmol/L (ref 135–145)
Total Bilirubin: 0.3 mg/dL (ref 0.3–1.2)
Total Protein: 6.9 g/dL (ref 6.5–8.1)

## 2018-06-26 LAB — PREPARE RBC (CROSSMATCH)

## 2018-06-26 LAB — SAMPLE TO BLOOD BANK

## 2018-06-26 MED ORDER — SODIUM CHLORIDE 0.9% FLUSH
10.0000 mL | INTRAVENOUS | Status: DC | PRN
  Administered 2018-06-26: 10 mL
  Filled 2018-06-26: qty 10

## 2018-06-26 MED ORDER — SODIUM CHLORIDE 0.9 % IV SOLN
Freq: Once | INTRAVENOUS | Status: AC
  Administered 2018-06-26: 11:00:00 via INTRAVENOUS
  Filled 2018-06-26: qty 250

## 2018-06-26 MED ORDER — SODIUM CHLORIDE 0.9% FLUSH
10.0000 mL | Freq: Once | INTRAVENOUS | Status: AC
  Administered 2018-06-26: 10 mL
  Filled 2018-06-26: qty 10

## 2018-06-26 MED ORDER — HEPARIN SOD (PORK) LOCK FLUSH 100 UNIT/ML IV SOLN
500.0000 [IU] | Freq: Once | INTRAVENOUS | Status: AC | PRN
  Administered 2018-06-26: 500 [IU]
  Filled 2018-06-26: qty 5

## 2018-06-26 MED ORDER — SODIUM CHLORIDE 0.9 % IV SOLN
200.0000 mg | Freq: Once | INTRAVENOUS | Status: AC
  Administered 2018-06-26: 200 mg via INTRAVENOUS
  Filled 2018-06-26: qty 8

## 2018-06-26 NOTE — Telephone Encounter (Signed)
Received call report from [facility].  "Today's HGB = 6.9."  Secure Chat sent  with results.  Received read confirmation from Provider.

## 2018-06-26 NOTE — Progress Notes (Signed)
Per Dr. Alen Blew, [place] to proceed with chemo with all lab results today.

## 2018-06-26 NOTE — Progress Notes (Signed)
Okay to treat with Hgb 6.9 and patient will be scheduled for 1 unit RBC's. Scheduling message sent.

## 2018-06-26 NOTE — Patient Instructions (Signed)
[facility] Discharge Instructions for Patients Receiving Chemotherapy  Today you received the following chemotherapy agents :  Keytruda.  To help prevent nausea and vomiting after your treatment, we encourage you to take your nausea medication as prescribed.   If you develop nausea and vomiting that is not controlled by your nausea medication, call the clinic.   BELOW ARE SYMPTOMS THAT SHOULD BE REPORTED IMMEDIATELY:  *FEVER GREATER THAN 100.5 F  *CHILLS WITH OR WITHOUT FEVER  NAUSEA AND VOMITING THAT IS NOT CONTROLLED WITH YOUR NAUSEA MEDICATION  *UNUSUAL SHORTNESS OF BREATH  *UNUSUAL BRUISING OR BLEEDING  TENDERNESS IN MOUTH AND THROAT WITH OR WITHOUT PRESENCE OF ULCERS  *URINARY PROBLEMS  *BOWEL PROBLEMS  UNUSUAL RASH Items with * indicate a potential emergency and should be followed up as soon as possible.  Feel free to call the clinic should you have any questions or concerns. The clinic phone number is [phone].  Please show the CHEMO ALERT CARD at check-in to the Emergency Department and triage nurse.  

## 2018-06-26 NOTE — Telephone Encounter (Signed)
Added 1 unit PRBC's for tomorrow. Spoke with infusion - patient will be appointment in infusion area.

## 2018-06-26 NOTE — Progress Notes (Signed)
Hematology and Oncology Follow Up Visit  Andrew Joyce [id number] [birth year] 58 y.o. 06/26/2018 9:09 AM Andrew Joyce, NPShambley, Andrew Grates, NP   Principle Diagnosis: 58 year old man with bladder cancer diagnosed in 2019.  He has stage IV disease documented to pelvic lymph nodes with biopsy proven to be high-grade urothelial carcinoma with sarcomatoid features.    Prior Therapy:  He is status post TURBT on July 23, 2017 which showed high-grade urothelial carcinoma with sarcomatoid feature.  Systemic chemotherapy utilizing gemcitabine and cisplatin cycle 1 started on September 16, 2017.  He completed 3 cycles of therapy in October 2019.  He is status post definitive therapy with radiation completed to the pelvis on March 03, 2018.  Current therapy: Pembrolizumab 200 mg every 3 weeks started on April 23, 2018.  He is status post 3 cycles of therapy.   Interim History: Andrew Joyce is here for a repeat evaluation.  Since her last visit, he was seen in the emergency department on 06/10/2018 for myoclonus and spasms.  His work-up did not reveal any acute CNS findings at that time including no abnormal CT scan images.  Since that visit, he feels reasonably well at this time without any major complaints.  He has tolerated pembrolizumab without any major complications.  He denies any nausea, fatigue or dyspnea exertion.  He does report some tiredness at times but no shortness of breath.   He denied headaches, blurry vision, syncope or seizures.  Denies any fevers, chills or sweats.  Denied chest pain, palpitation, orthopnea or leg edema.  Denied cough, wheezing or hemoptysis.  Denied nausea, vomiting or abdominal pain.  Denies any constipation or diarrhea.  Denies any frequency urgency or hesitancy.  Denies any arthralgias or myalgias.  Denies any skin rashes or lesions.  Denies any bleeding or clotting tendency.  Denies any easy bruising.  Denies any hair or nail changes.  Denies  any anxiety or depression.  Remaining review of system is negative.           Medications: I have reviewed the patient's current medications.  Current Outpatient Medications  Medication Sig Dispense Refill  . baclofen (LIORESAL) 10 MG tablet TAKE 2 TABLETS BY MOUTH THREE TIMES DAILY (Patient taking differently: Take 20 mg by mouth 3 (three) times daily. ) 180 each 5  . carbamazepine (CARBATROL) 100 MG 12 hr capsule Take 1 capsule (100 mg total) by mouth 2 (two) times daily. 180 capsule 3  . fenofibrate 160 MG tablet Take 1 tablet by mouth once daily 90 tablet 0  . glucose blood (ACCU-CHEK GUIDE) test strip Use to check blood sugars 2 times daily. Dx Code: E11.9 100 each 12  . isosorbide mononitrate (IMDUR) 60 MG 24 hr tablet TAKE 1 & 1/2 (ONE & ONE-HALF) TABLETS BY MOUTH ONCE DAILY (Patient taking differently: Take 60 mg by mouth daily. ) 135 tablet 2  . meloxicam (MOBIC) 15 MG tablet Take 1 tablet (15 mg total) by mouth daily. 30 tablet 1  . methenamine (MANDELAMINE) 1 g tablet Take 1,000 mg by mouth 3 (three) times daily.     . nitroGLYCERIN (NITROSTAT) 0.4 MG SL tablet Place 1 tablet (0.4 mg total) under the tongue every 5 (five) minutes as needed. For chest pain 25 tablet 3  . Oxycodone HCl 10 MG TABS Take 10 mg by mouth every 6 (six) hours as needed (pain).     [name] POTASSIUM CITRATE PO Take 20 mg by mouth daily.    [name] rOPINIRole (REQUIP) 0.25  MG tablet Take 1 tablet (0.25 mg total) by mouth at bedtime. TAKE 1 TABLET BY MOUTH 3 HOURS BEFORE BEDTIME 90 tablet 3  . rosuvastatin (CRESTOR) 40 MG tablet Take 1 tablet (40 mg total) by mouth daily. 30 tablet 4  . senna-docusate (SENNA S) 8.6-50 MG tablet Take 1 tablet by mouth daily. 60 tablet 3  . sitaGLIPtin (JANUVIA) 50 MG tablet Take 1 tablet (50 mg total) by mouth daily. 90 tablet 0  . sodium chloride flush 0.9 % SOLN injection Flush 10 mL of saline into each nephrostomy tube daily.    [name] triamcinolone ointment (KENALOG) 0.5 % Apply 1  application topically 2 (two) times daily. 30 g 0  . zolpidem (AMBIEN) 5 MG tablet Take 5 mg by mouth at bedtime as needed for sleep.     No current facility-administered medications for this visit.      Allergies: No Known Allergies  Past Medical History, Surgical history, Social history, and Family History were reviewed and updated.    Physical Exam:   Blood pressure 93/61, pulse 98, temperature 98.3 F (36.8 C), temperature source Oral, resp. rate 18, height 5\' 10"  (1.778 m), weight 134 lb 12.8 oz (61.1 kg), SpO2 96 %.     ECOG: 0    General appearance: Comfortable appearing without any discomfort Head: Normocephalic without any trauma Oropharynx: Mucous membranes are moist and pink without any thrush or ulcers. Eyes: Pupils are equal and round reactive to light. Lymph nodes: No cervical, supraclavicular, inguinal or axillary lymphadenopathy.   Heart:regular rate and rhythm.  S1 and S2 without leg edema. Lung: Clear without any rhonchi or wheezes.  No dullness to percussion. Abdomin: Soft, nontender, nondistended with good bowel sounds.  No hepatosplenomegaly. Musculoskeletal: No joint deformity or effusion.  Full range of motion noted. Neurological: No deficits noted on motor, sensory and deep tendon reflex exam. Skin: No erythema or induration noted.  Erythema noted on his thigh has resolved from previously.         Lab Results: Lab Results  Component Value Date   WBC 8.4 06/10/2018   HGB 8.8 (L) 06/10/2018   HCT 29.1 (L) 06/10/2018   MCV 95.1 06/10/2018   PLT 313 06/10/2018     Chemistry      Component Value Date/Time   NA 138 06/10/2018 1232   NA 141 07/24/2017 0942   K 4.4 06/10/2018 1232   CL 102 06/10/2018 1232   CO2 26 06/10/2018 1232   BUN 27 (H) 06/10/2018 1232   BUN 35 (H) 07/24/2017 0942   CREATININE 1.52 (H) 06/10/2018 1232   CREATININE 1.80 (H) 06/05/2018 1135   CREATININE 1.38 (H) 03/01/2015 0942      Component Value Date/Time    CALCIUM 9.8 06/10/2018 1232   ALKPHOS 68 06/10/2018 1232   AST 15 06/10/2018 1232   AST 9 (L) 06/05/2018 1135   ALT 12 06/10/2018 1232   ALT 7 06/05/2018 1135   BILITOT 0.3 06/10/2018 1232   BILITOT 0.2 (L) 06/05/2018 1135        Impression and Plan:  58 year old man with:  1.  Bladder cancer diagnosed in 2019 with currently stage IV high-grade urothelial carcinoma with involvement of pelvic lymph nodes.   He remains on Pembrolizumab without any major complications.  Risks and benefits of continuing this therapy was reviewed today.  Plan is to repeat imaging studies in July before the next cycle of therapy.  Different salvage therapy were reviewed today including different chemotherapy regimen as  well as Padcev.  He is agreeable to proceed at this time.    2.  IV access: Port-A-Cath remains in use for systemic therapy without any issues.  3.  Antiemetics: No nausea or vomiting reported at this time.  Antiemetics are available to him.  4.  Renal insufficiency: Due to malignancy.  Nephrostomy tube remains in place which will be replaced periodically.  It was replaced recently and scheduled to have another replacement by the end of the month.  5.  Goals of care: His disease is incurable although aggressive therapy is warranted given his excellent performance status.  6.  Anemia:  His anemia is related to renal sufficiency as well as malignancy.  His hemoglobin of drifted down to 6.9 and I would be in favor of 1 unit of packed red cell transfusion.  He is agreeable to proceed.  7.  Pain: Currently on fentanyl patch and oxycodone.  Pain is manageable.  8.  Immune mediated complications: I reiterated potential complication associated with immunotherapy.  These include rash, pneumonitis, colitis, thyroid disease and arthritis.  9.  Follow-up: We will be in July 2020 is to repeat imaging studies.  25 minutes was spent with the patient face-to-face today.  More than 50% of time  was dedicated to reviewing his disease status, treatment options and complications related to therapy.    Zola Button, MD 6/19/20209:09 AM

## 2018-06-27 ENCOUNTER — Inpatient Hospital Stay: Payer: Medicare Other

## 2018-06-27 ENCOUNTER — Other Ambulatory Visit: Payer: Self-pay

## 2018-06-27 VITALS — BP 94/69 | HR 64 | Temp 98.9°F | Resp 18

## 2018-06-27 DIAGNOSIS — C775 Secondary and unspecified malignant neoplasm of intrapelvic lymph nodes: Secondary | ICD-10-CM | POA: Diagnosis not present

## 2018-06-27 DIAGNOSIS — Z79899 Other long term (current) drug therapy: Secondary | ICD-10-CM | POA: Diagnosis not present

## 2018-06-27 DIAGNOSIS — N289 Disorder of kidney and ureter, unspecified: Secondary | ICD-10-CM | POA: Diagnosis not present

## 2018-06-27 DIAGNOSIS — D649 Anemia, unspecified: Secondary | ICD-10-CM

## 2018-06-27 DIAGNOSIS — Z95828 Presence of other vascular implants and grafts: Secondary | ICD-10-CM

## 2018-06-27 DIAGNOSIS — C679 Malignant neoplasm of bladder, unspecified: Secondary | ICD-10-CM

## 2018-06-27 DIAGNOSIS — Z5112 Encounter for antineoplastic immunotherapy: Secondary | ICD-10-CM | POA: Diagnosis not present

## 2018-06-27 MED ORDER — HEPARIN SOD (PORK) LOCK FLUSH 100 UNIT/ML IV SOLN
500.0000 [IU] | Freq: Once | INTRAVENOUS | Status: AC
  Administered 2018-06-27: 11:00:00 500 [IU]
  Filled 2018-06-27: qty 5

## 2018-06-27 MED ORDER — DIPHENHYDRAMINE HCL 25 MG PO CAPS
ORAL_CAPSULE | ORAL | Status: AC
  Filled 2018-06-27: qty 1

## 2018-06-27 MED ORDER — DIPHENHYDRAMINE HCL 25 MG PO CAPS
25.0000 mg | ORAL_CAPSULE | Freq: Once | ORAL | Status: AC
  Administered 2018-06-27: 25 mg via ORAL

## 2018-06-27 MED ORDER — SODIUM CHLORIDE 0.9 % IV SOLN
750.0000 mL | Freq: Once | INTRAVENOUS | Status: DC
  Filled 2018-06-27: qty 750

## 2018-06-27 MED ORDER — SODIUM CHLORIDE 0.9% IV SOLUTION
250.0000 mL | Freq: Once | INTRAVENOUS | Status: AC
  Administered 2018-06-27: 250 mL via INTRAVENOUS
  Filled 2018-06-27: qty 250

## 2018-06-27 MED ORDER — SODIUM CHLORIDE 0.9% FLUSH
10.0000 mL | Freq: Once | INTRAVENOUS | Status: AC
  Administered 2018-06-27: 10 mL
  Filled 2018-06-27: qty 10

## 2018-06-27 MED ORDER — ACETAMINOPHEN 325 MG PO TABS
650.0000 mg | ORAL_TABLET | Freq: Once | ORAL | Status: AC
  Administered 2018-06-27: 650 mg via ORAL

## 2018-06-27 MED ORDER — ACETAMINOPHEN 325 MG PO TABS
ORAL_TABLET | ORAL | Status: AC
  Filled 2018-06-27: qty 2

## 2018-06-27 NOTE — Patient Instructions (Signed)
Blood Transfusion, Adult, Care After This sheet gives you information about how to care for yourself after your procedure. Your doctor may also give you more specific instructions. If you have problems or questions, contact your doctor. Follow these instructions at home:   Take over-the-counter and prescription medicines only as told by your doctor.  Go back to your normal activities as told by your doctor.  Follow instructions from your doctor about how to take care of the area where an IV tube was put into your vein (insertion site). Make sure you: ? Wash your hands with soap and water before you change your bandage (dressing). If there is no soap and water, use hand sanitizer. ? Change your bandage as told by your doctor.  Check your IV insertion site every day for signs of infection. Check for: ? More redness, swelling, or pain. ? More fluid or blood. ? Warmth. ? Pus or a bad smell. Contact a doctor if:  You have more redness, swelling, or pain around the IV insertion site.  You have more fluid or blood coming from the IV insertion site.  Your IV insertion site feels warm to the touch.  You have pus or a bad smell coming from the IV insertion site.  Your pee (urine) turns pink, red, or brown.  You feel weak after doing your normal activities. Get help right away if:  You have signs of a serious allergic or body defense (immune) system reaction, including: ? Itchiness. ? Hives. ? Trouble breathing. ? Anxiety. ? Pain in your chest or lower back. ? Fever, flushing, and chills. ? Fast pulse. ? Rash. ? Watery poop (diarrhea). ? Throwing up (vomiting). ? Dark pee. ? Serious headache. ? Dizziness. ? Stiff neck. ? Yellow color in your face or the white parts of your eyes (jaundice). Summary  After a blood transfusion, return to your normal activities as told by your doctor.  Every day, check for signs of infection where the IV tube was put into your vein.  Some  signs of infection are warm skin, more redness and pain, more fluid or blood, and pus or a bad smell where the needle went in.  Contact your doctor if you feel weak or have any unusual symptoms. This information is not intended to replace advice given to you by your health care provider. Make sure you discuss any questions you have with your health care provider. Document Released: 01/14/2014 Document Revised: 08/18/2015 Document Reviewed: 08/18/2015 Elsevier Interactive Patient Education  2019 [employer]  

## 2018-06-29 ENCOUNTER — Telehealth: Payer: Self-pay | Admitting: Oncology

## 2018-06-29 LAB — TYPE AND SCREEN
ABO/RH(D): O POS
Antibody Screen: NEGATIVE
Unit division: 0

## 2018-06-29 LAB — BPAM RBC
Blood Product Expiration Date: 202007052359
ISSUE DATE / TIME: 202006200828
Unit Type and Rh: 5100

## 2018-06-29 NOTE — Telephone Encounter (Signed)
No 6/19 los °

## 2018-06-30 ENCOUNTER — Encounter (HOSPITAL_COMMUNITY): Payer: Self-pay | Admitting: Interventional Radiology

## 2018-06-30 ENCOUNTER — Ambulatory Visit (HOSPITAL_COMMUNITY)
Admission: RE | Admit: 2018-06-30 | Discharge: 2018-06-30 | Disposition: A | Discharge status: Home / Self Care | Payer: Medicare Other | Source: Ambulatory Visit | Attending: Interventional Radiology | Admitting: Interventional Radiology

## 2018-06-30 ENCOUNTER — Other Ambulatory Visit: Payer: Self-pay

## 2018-06-30 ENCOUNTER — Other Ambulatory Visit (HOSPITAL_COMMUNITY): Payer: Self-pay | Admitting: Interventional Radiology

## 2018-06-30 DIAGNOSIS — Z436 Encounter for attention to other artificial openings of urinary tract: Secondary | ICD-10-CM | POA: Diagnosis not present

## 2018-06-30 DIAGNOSIS — N1330 Unspecified hydronephrosis: Secondary | ICD-10-CM | POA: Diagnosis not present

## 2018-06-30 DIAGNOSIS — T83092A Other mechanical complication of nephrostomy catheter, initial encounter: Secondary | ICD-10-CM | POA: Diagnosis not present

## 2018-06-30 HISTORY — PX: IR NEPHROSTOMY EXCHANGE LEFT: IMG6069

## 2018-06-30 MED ORDER — LIDOCAINE HCL 1 % IJ SOLN
INTRAMUSCULAR | Status: AC
  Filled 2018-06-30: qty 20

## 2018-06-30 MED ORDER — IOHEXOL 300 MG/ML  SOLN
50.0000 mL | Freq: Once | INTRAMUSCULAR | Status: AC | PRN
  Administered 2018-06-30: 10 mL

## 2018-07-07 ENCOUNTER — Other Ambulatory Visit: Payer: Self-pay | Admitting: Cardiology

## 2018-07-07 ENCOUNTER — Other Ambulatory Visit (HOSPITAL_COMMUNITY): Payer: Medicare Other

## 2018-07-09 ENCOUNTER — Telehealth: Payer: Self-pay | Admitting: Cardiology

## 2018-07-09 NOTE — Telephone Encounter (Signed)
LVM, reminding pt of his appt with Dr Jordan on 07-13-18. °

## 2018-07-11 NOTE — Progress Notes (Deleted)
Cardiology Office Note   Date:  07/11/2018   ID:  Andrew Joyce, DOB 12-24-1960, MRN [id number]  PCP:  [name], NP  Cardiologist:  Dr. Peter Martinique      History of Present Illness: Andrew Joyce is a 58 y.o. male seen for follow up CAD. He has a hx of CAD s/p CABG in 2008, HTN, HL, CKD.  He suffered a non-STEMI in February 2013. Culprit was felt to be an occluded SVG-OM1/OM2. Native circumflex had 40-50% distal stenosis. There were no targets for revascularization and he was treated medically.   He has a history orthostatic intolerance. Imdur dose was reduced.  Echo May 2015 demonstrated EF 55-60%, mild inferior hypokinesis, top normal LA/RA size.Poorly visualized aortic valve, however, there is mild central AI - no stenosis.    He has bladder cancer diagnosed in 2019.  He has stage IV disease documented to pelvic lymph nodes with biopsy proven to be high-grade urothelial carcinoma with sarcomatoid features. Systemic chemotherapy utilizing gemcitabine and cisplatin cycle 1 started on September 16, 2017.  He completed 3 cycles of therapy in October 2019. Last month he was anemic with Hgb down to 6.9. transfused one unit PRBCs. Has nephrostomy tube in place.   He is status post definitive therapy with radiation completed to the pelvis on March 03, 2018.  On follow up today he is doing well from a cardiac standpoint. He denies any chest pain or need for Ntg.  He stays active- mainly with weight lifting. Works out every other day.  He does have recurrent UTIs and renal stones followed by urology. He has frequent calculi passing multiple stones a month. This has improved with something to increase his citrate level.  He underwent cystourethroscopy with ureteral stenting and resection of a bladder tumor yesterday.  He has a history of  severe hypertriglyceridemia and last labs in January showed elevated sugar.     Recent Labs: 07/24/2017: HDL 33; LDL Calculated 77  06/05/2018: TSH 2.404 06/26/2018: ALT 9; Creatinine 1.99; Hemoglobin 6.9; Potassium 4.3  Wt Readings from Last 3 Encounters:  06/26/18 134 lb 12.8 oz (61.1 kg)  06/19/18 140 lb (63.5 kg)  06/05/18 136 lb 9.6 oz (62 kg)     Past Medical History:  Diagnosis Date  . 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 Cancer ([place])   . Chronic kidney disease (CKD), stage II (mild)   . Congenital birth defect   . Coronary artery disease    a. s/p CABG in 2008; b.  s/p cath in 2011 for med rx.;  c.  NSTEMI (02/2011):  LHC (02/2011):  LAD occluded, distal LAD filled via left to left collaterals, distal CFX 40-50%, proximal RCA occluded (right to right collaterals), distal RCA occluded, S-RCA occluded proximally, S-OM1/OM2 occluded (new from 2011 - culprit), L-LAD ok, ant apical HK, EF 45-50% - med Rx.  [name] Dyslipidemia   . History of renal stone   . Hx of cardiovascular stress test    Nuclear (06/2009):  EF 71%, small area of infarct/ischemia in inferior wall-unchanged from prior  . Hx of echocardiogram    Echo (05/2013):  EF 55-60%, inf HK, mild AI, normal RVSF, RVSP 28 mmHg  . Hypertension   . Pancreatitis   . Recurrent UTI     Current Outpatient Medications  Medication Sig Dispense Refill  . baclofen (LIORESAL) 10 MG tablet TAKE 2 TABLETS BY MOUTH THREE TIMES DAILY (Patient taking  differently: Take 20 mg by mouth 3 (three) times daily. ) 180 each 5  . carbamazepine (CARBATROL) 100 MG 12 hr capsule Take 1 capsule (100 mg total) by mouth 2 (two) times daily. 180 capsule 3  . fenofibrate 160 MG tablet Take 1 tablet by mouth once daily 90 tablet 0  . glucose blood (ACCU-CHEK GUIDE) test strip Use to check blood sugars 2 times daily. Dx Code: E11.9 100 each 12  . isosorbide mononitrate (IMDUR) 60 MG 24 hr tablet TAKE 1 & 1/2 (ONE & ONE-HALF) TABLETS BY MOUTH ONCE DAILY (Patient taking differently: Take 60 mg by mouth daily. ) 135 tablet 2  . meloxicam (MOBIC) 15 MG tablet Take 1  tablet (15 mg total) by mouth daily. 30 tablet 1  . methenamine (MANDELAMINE) 1 g tablet Take 1,000 mg by mouth 3 (three) times daily.     . nitroGLYCERIN (NITROSTAT) 0.4 MG SL tablet Place 1 tablet (0.4 mg total) under the tongue every 5 (five) minutes as needed. For chest pain 25 tablet 3  . Oxycodone HCl 10 MG TABS Take 10 mg by mouth every 6 (six) hours as needed (pain).     [name] POTASSIUM CITRATE PO Take 20 mg by mouth daily.    [name] rOPINIRole (REQUIP) 0.25 MG tablet Take 1 tablet (0.25 mg total) by mouth at bedtime. TAKE 1 TABLET BY MOUTH 3 HOURS BEFORE BEDTIME 90 tablet 3  . rosuvastatin (CRESTOR) 40 MG tablet Take 1 tablet by mouth once daily 90 tablet 1  . senna-docusate (SENNA S) 8.6-50 MG tablet Take 1 tablet by mouth daily. 60 tablet 3  . sitaGLIPtin (JANUVIA) 50 MG tablet Take 1 tablet (50 mg total) by mouth daily. 90 tablet 0  . sodium chloride flush 0.9 % SOLN injection Flush 10 mL of saline into each nephrostomy tube daily.    [name] triamcinolone ointment (KENALOG) 0.5 % Apply 1 application topically 2 (two) times daily. 30 g 0  . zolpidem (AMBIEN) 5 MG tablet Take 5 mg by mouth at bedtime as needed for sleep.     No current facility-administered medications for this visit.     Allergies:   Patient has no known allergies.   Social History:  The patient  reports that he has never smoked. He has never used smokeless tobacco. He reports that he does not drink alcohol or use drugs.   Family History:  The patient's family history includes Healthy in his brother and sister; Hyperlipidemia in his father and mother; Hypertension in his father and mother.   ROS:  Please see the history of present illness.     All other systems reviewed and negative.   PHYSICAL EXAM: VS:  There were no vitals taken for this visit.  GENERAL:  Well appearing WM in NAD HEENT:  PERRL, EOMI, sclera are clear. Oropharynx is clear. NECK:  No jugular venous distention, carotid upstroke brisk and symmetric, no  bruits, no thyromegaly or adenopathy LUNGS:  Clear to auscultation bilaterally CHEST:  Unremarkable HEART:  RRR,  PMI not displaced or sustained,S1 and S2 within normal limits, no S3, no S4: no clicks, no rubs, no murmurs ABD:  Soft, nontender. BS +, no masses or bruits. No hepatomegaly, no splenomegaly EXT:  2 + pulses throughout, no edema, no cyanosis no clubbing SKIN:  Warm and dry.  No rashes NEURO:  Alert and oriented x 3. Cranial nerves II through XII intact. PSYCH:  Cognitively intact      Laboratory data:  Lab Results  Component Value Date   WBC 9.0 06/26/2018   HGB 6.9 (LL) 06/26/2018   HCT 22.8 (L) 06/26/2018   PLT 295 06/26/2018   GLUCOSE 204 (H) 06/26/2018   CHOL 148 07/24/2017   TRIG 188 (H) 07/24/2017   HDL 33 (L) 07/24/2017   LDLDIRECT 45 03/01/2015   LDLCALC 77 07/24/2017   ALT 9 06/26/2018   AST 13 (L) 06/26/2018   NA 137 06/26/2018   K 4.3 06/26/2018   CL 103 06/26/2018   CREATININE 1.99 (H) 06/26/2018   BUN 25 (H) 06/26/2018   CO2 24 06/26/2018   TSH 2.404 06/05/2018   INR 1.1 03/21/2018   HGBA1C 5.8 (H) 03/22/2018     ASSESSMENT AND PLAN:  1. Orthostatic intolerance: this has resolved with reduction in medication. 2. CAD (coronary artery disease): s/p remote CABG in 2008. Occluded SVG to OM1 and OM2 in 2013.  Stable angina pectoris class 1. Continue aspirin, Plavix, nitrates, beta blocker.  3. HTN (hypertension):  This is well controlled.  4. Hyperlipidemia- combined with severe hypertriglyceridemia. :   On pravastatin, fenofibrate, fish oil. Last lipid panel showed significant improvement in triglycerides to 262. Will update today 5. CKD (chronic kidney disease) stage 2, GFR 60-89 ml/min:   6. Renal calculi- recurrent 7. Bladder Cancer stage IV.   I will follow up in 6 months.

## 2018-07-13 ENCOUNTER — Ambulatory Visit: Payer: Medicare Other | Admitting: Cardiology

## 2018-07-17 ENCOUNTER — Other Ambulatory Visit: Payer: Self-pay

## 2018-07-17 ENCOUNTER — Ambulatory Visit (HOSPITAL_COMMUNITY)
Admission: RE | Admit: 2018-07-17 | Discharge: 2018-07-17 | Disposition: A | Discharge status: Home / Self Care | Payer: Medicare Other | Source: Ambulatory Visit | Attending: Oncology | Admitting: Oncology

## 2018-07-17 DIAGNOSIS — C679 Malignant neoplasm of bladder, unspecified: Secondary | ICD-10-CM | POA: Diagnosis not present

## 2018-07-17 DIAGNOSIS — R918 Other nonspecific abnormal finding of lung field: Secondary | ICD-10-CM | POA: Diagnosis not present

## 2018-07-17 DIAGNOSIS — N3289 Other specified disorders of bladder: Secondary | ICD-10-CM | POA: Diagnosis not present

## 2018-07-20 ENCOUNTER — Other Ambulatory Visit: Payer: Self-pay | Admitting: Oncology

## 2018-07-20 ENCOUNTER — Telehealth: Payer: Self-pay | Admitting: Oncology

## 2018-07-20 DIAGNOSIS — Z7189 Other specified counseling: Secondary | ICD-10-CM

## 2018-07-20 NOTE — Progress Notes (Signed)
DISCONTINUE ON PATHWAY REGIMEN - Bladder     A cycle is 21 days:     Pembrolizumab   **Always confirm dose/schedule in your pharmacy ordering system**  REASON: Disease Progression PRIOR TREATMENT: [id number]: Pembrolizumab 200 mg q21 Days Until Progression,  Unacceptable Toxicity, or up to 24 Months TREATMENT RESPONSE: Progressive Disease (PD)  START ON PATHWAY REGIMEN - Bladder     A cycle is every 28 days:     Enfortumab vedotin-ejfv   **Always confirm dose/schedule in your pharmacy ordering system**  Patient Characteristics: Metastatic Disease, Third Line and Beyond Therapeutic Status: Metastatic Disease Line of Therapy: Third Line and Beyond  Intent of Therapy: Non-Curative / Palliative Intent, Discussed with Patient

## 2018-07-20 NOTE — Telephone Encounter (Signed)
Added infusion to 7/17 lab/fu. Left message for patient confirming appointment.

## 2018-07-24 ENCOUNTER — Inpatient Hospital Stay: Payer: Medicare Other

## 2018-07-24 ENCOUNTER — Emergency Department (HOSPITAL_COMMUNITY): Payer: Medicare Other

## 2018-07-24 ENCOUNTER — Inpatient Hospital Stay: Payer: Medicare Other | Admitting: Oncology

## 2018-07-24 ENCOUNTER — Inpatient Hospital Stay (HOSPITAL_COMMUNITY)
Admission: EM | Admit: 2018-07-24 | Discharge: 2018-07-29 | DRG: 698 | Disposition: A | Discharge status: Home / Self Care | Payer: Medicare Other | Attending: Family Medicine | Admitting: Family Medicine

## 2018-07-24 DIAGNOSIS — K632 Fistula of intestine: Secondary | ICD-10-CM | POA: Diagnosis present

## 2018-07-24 DIAGNOSIS — I10 Essential (primary) hypertension: Secondary | ICD-10-CM | POA: Diagnosis present

## 2018-07-24 DIAGNOSIS — A419 Sepsis, unspecified organism: Secondary | ICD-10-CM | POA: Diagnosis present

## 2018-07-24 DIAGNOSIS — D649 Anemia, unspecified: Secondary | ICD-10-CM | POA: Diagnosis not present

## 2018-07-24 DIAGNOSIS — Z8551 Personal history of malignant neoplasm of bladder: Secondary | ICD-10-CM

## 2018-07-24 DIAGNOSIS — N453 Epididymo-orchitis: Secondary | ICD-10-CM | POA: Diagnosis present

## 2018-07-24 DIAGNOSIS — R410 Disorientation, unspecified: Secondary | ICD-10-CM | POA: Diagnosis not present

## 2018-07-24 DIAGNOSIS — N183 Chronic kidney disease, stage 3 (moderate): Secondary | ICD-10-CM | POA: Diagnosis present

## 2018-07-24 DIAGNOSIS — E43 Unspecified severe protein-calorie malnutrition: Secondary | ICD-10-CM | POA: Diagnosis present

## 2018-07-24 DIAGNOSIS — N322 Vesical fistula, not elsewhere classified: Secondary | ICD-10-CM | POA: Diagnosis present

## 2018-07-24 DIAGNOSIS — C689 Malignant neoplasm of urinary organ, unspecified: Secondary | ICD-10-CM

## 2018-07-24 DIAGNOSIS — Q6410 Exstrophy of urinary bladder, unspecified: Secondary | ICD-10-CM

## 2018-07-24 DIAGNOSIS — Z681 Body mass index (BMI) 19 or less, adult: Secondary | ICD-10-CM

## 2018-07-24 DIAGNOSIS — R0902 Hypoxemia: Secondary | ICD-10-CM | POA: Diagnosis not present

## 2018-07-24 DIAGNOSIS — D500 Iron deficiency anemia secondary to blood loss (chronic): Secondary | ICD-10-CM | POA: Diagnosis present

## 2018-07-24 DIAGNOSIS — Z79899 Other long term (current) drug therapy: Secondary | ICD-10-CM

## 2018-07-24 DIAGNOSIS — B964 Proteus (mirabilis) (morganii) as the cause of diseases classified elsewhere: Secondary | ICD-10-CM | POA: Diagnosis present

## 2018-07-24 DIAGNOSIS — E872 Acidosis: Secondary | ICD-10-CM | POA: Diagnosis present

## 2018-07-24 DIAGNOSIS — N179 Acute kidney failure, unspecified: Secondary | ICD-10-CM | POA: Diagnosis not present

## 2018-07-24 DIAGNOSIS — E871 Hypo-osmolality and hyponatremia: Secondary | ICD-10-CM | POA: Diagnosis present

## 2018-07-24 DIAGNOSIS — E782 Mixed hyperlipidemia: Secondary | ICD-10-CM | POA: Diagnosis not present

## 2018-07-24 DIAGNOSIS — Z8349 Family history of other endocrine, nutritional and metabolic diseases: Secondary | ICD-10-CM

## 2018-07-24 DIAGNOSIS — F4321 Adjustment disorder with depressed mood: Secondary | ICD-10-CM | POA: Diagnosis present

## 2018-07-24 DIAGNOSIS — I129 Hypertensive chronic kidney disease with stage 1 through stage 4 chronic kidney disease, or unspecified chronic kidney disease: Secondary | ICD-10-CM | POA: Diagnosis present

## 2018-07-24 DIAGNOSIS — L0390 Cellulitis, unspecified: Secondary | ICD-10-CM | POA: Diagnosis present

## 2018-07-24 DIAGNOSIS — I252 Old myocardial infarction: Secondary | ICD-10-CM | POA: Diagnosis not present

## 2018-07-24 DIAGNOSIS — E1152 Type 2 diabetes mellitus with diabetic peripheral angiopathy with gangrene: Secondary | ICD-10-CM | POA: Diagnosis present

## 2018-07-24 DIAGNOSIS — R579 Shock, unspecified: Secondary | ICD-10-CM

## 2018-07-24 DIAGNOSIS — R6521 Severe sepsis with septic shock: Secondary | ICD-10-CM | POA: Diagnosis not present

## 2018-07-24 DIAGNOSIS — E1122 Type 2 diabetes mellitus with diabetic chronic kidney disease: Secondary | ICD-10-CM | POA: Diagnosis present

## 2018-07-24 DIAGNOSIS — T83511A Infection and inflammatory reaction due to indwelling urethral catheter, initial encounter: Secondary | ICD-10-CM | POA: Diagnosis present

## 2018-07-24 DIAGNOSIS — Z87442 Personal history of urinary calculi: Secondary | ICD-10-CM

## 2018-07-24 DIAGNOSIS — I25119 Atherosclerotic heart disease of native coronary artery with unspecified angina pectoris: Secondary | ICD-10-CM | POA: Diagnosis not present

## 2018-07-24 DIAGNOSIS — I959 Hypotension, unspecified: Secondary | ICD-10-CM | POA: Diagnosis not present

## 2018-07-24 DIAGNOSIS — I2510 Atherosclerotic heart disease of native coronary artery without angina pectoris: Secondary | ICD-10-CM | POA: Diagnosis present

## 2018-07-24 DIAGNOSIS — E1165 Type 2 diabetes mellitus with hyperglycemia: Secondary | ICD-10-CM | POA: Diagnosis not present

## 2018-07-24 DIAGNOSIS — Z1159 Encounter for screening for other viral diseases: Secondary | ICD-10-CM

## 2018-07-24 DIAGNOSIS — S199XXA Unspecified injury of neck, initial encounter: Secondary | ICD-10-CM | POA: Diagnosis not present

## 2018-07-24 DIAGNOSIS — Z515 Encounter for palliative care: Secondary | ICD-10-CM | POA: Diagnosis not present

## 2018-07-24 DIAGNOSIS — C679 Malignant neoplasm of bladder, unspecified: Secondary | ICD-10-CM | POA: Diagnosis present

## 2018-07-24 DIAGNOSIS — R404 Transient alteration of awareness: Secondary | ICD-10-CM | POA: Diagnosis not present

## 2018-07-24 DIAGNOSIS — E785 Hyperlipidemia, unspecified: Secondary | ICD-10-CM | POA: Diagnosis present

## 2018-07-24 DIAGNOSIS — N390 Urinary tract infection, site not specified: Secondary | ICD-10-CM | POA: Diagnosis present

## 2018-07-24 DIAGNOSIS — Z87440 Personal history of urinary (tract) infections: Secondary | ICD-10-CM

## 2018-07-24 DIAGNOSIS — Z79891 Long term (current) use of opiate analgesic: Secondary | ICD-10-CM

## 2018-07-24 DIAGNOSIS — Z951 Presence of aortocoronary bypass graft: Secondary | ICD-10-CM

## 2018-07-24 DIAGNOSIS — I951 Orthostatic hypotension: Secondary | ICD-10-CM | POA: Diagnosis present

## 2018-07-24 DIAGNOSIS — E119 Type 2 diabetes mellitus without complications: Secondary | ICD-10-CM

## 2018-07-24 DIAGNOSIS — Z8249 Family history of ischemic heart disease and other diseases of the circulatory system: Secondary | ICD-10-CM

## 2018-07-24 DIAGNOSIS — E1140 Type 2 diabetes mellitus with diabetic neuropathy, unspecified: Secondary | ICD-10-CM | POA: Diagnosis present

## 2018-07-24 DIAGNOSIS — S299XXA Unspecified injury of thorax, initial encounter: Secondary | ICD-10-CM | POA: Diagnosis not present

## 2018-07-24 DIAGNOSIS — N139 Obstructive and reflux uropathy, unspecified: Secondary | ICD-10-CM | POA: Diagnosis present

## 2018-07-24 DIAGNOSIS — E781 Pure hyperglyceridemia: Secondary | ICD-10-CM | POA: Diagnosis present

## 2018-07-24 DIAGNOSIS — N10 Acute tubulo-interstitial nephritis: Secondary | ICD-10-CM | POA: Diagnosis not present

## 2018-07-24 DIAGNOSIS — S0990XA Unspecified injury of head, initial encounter: Secondary | ICD-10-CM | POA: Diagnosis not present

## 2018-07-24 DIAGNOSIS — Z20828 Contact with and (suspected) exposure to other viral communicable diseases: Secondary | ICD-10-CM | POA: Diagnosis not present

## 2018-07-24 DIAGNOSIS — N281 Cyst of kidney, acquired: Secondary | ICD-10-CM | POA: Diagnosis not present

## 2018-07-24 LAB — HEPATIC FUNCTION PANEL
ALT: 12 U/L (ref 0–44)
AST: 17 U/L (ref 15–41)
Albumin: 1.8 g/dL — ABNORMAL LOW (ref 3.5–5.0)
Alkaline Phosphatase: 77 U/L (ref 38–126)
Bilirubin, Direct: 0.3 mg/dL — ABNORMAL HIGH (ref 0.0–0.2)
Indirect Bilirubin: 0.1 mg/dL — ABNORMAL LOW (ref 0.3–0.9)
Total Bilirubin: 0.4 mg/dL (ref 0.3–1.2)
Total Protein: 6.3 g/dL — ABNORMAL LOW (ref 6.5–8.1)

## 2018-07-24 LAB — CBG MONITORING, ED
Glucose-Capillary: 236 mg/dL — ABNORMAL HIGH (ref 70–99)
Glucose-Capillary: 237 mg/dL — ABNORMAL HIGH (ref 70–99)

## 2018-07-24 LAB — CBC WITH DIFFERENTIAL/PLATELET
Abs Immature Granulocytes: 0.17 10*3/uL — ABNORMAL HIGH (ref 0.00–0.07)
Basophils Absolute: 0 10*3/uL (ref 0.0–0.1)
Basophils Relative: 0 %
Eosinophils Absolute: 0.1 10*3/uL (ref 0.0–0.5)
Eosinophils Relative: 1 %
HCT: 21 % — ABNORMAL LOW (ref 39.0–52.0)
Hemoglobin: 6.4 g/dL — CL (ref 13.0–17.0)
Immature Granulocytes: 1 %
Lymphocytes Relative: 3 %
Lymphs Abs: 0.4 10*3/uL — ABNORMAL LOW (ref 0.7–4.0)
MCH: 28.6 pg (ref 26.0–34.0)
MCHC: 30.5 g/dL (ref 30.0–36.0)
MCV: 93.8 fL (ref 80.0–100.0)
Monocytes Absolute: 0.8 10*3/uL (ref 0.1–1.0)
Monocytes Relative: 6 %
Neutro Abs: 11.5 10*3/uL — ABNORMAL HIGH (ref 1.7–7.7)
Neutrophils Relative %: 89 %
Platelets: 360 10*3/uL (ref 150–400)
RBC: 2.24 MIL/uL — ABNORMAL LOW (ref 4.22–5.81)
RDW: 15.9 % — ABNORMAL HIGH (ref 11.5–15.5)
WBC: 13 10*3/uL — ABNORMAL HIGH (ref 4.0–10.5)
nRBC: 0 % (ref 0.0–0.2)

## 2018-07-24 LAB — LACTIC ACID, PLASMA
Lactic Acid, Venous: 1.4 mmol/L (ref 0.5–1.9)
Lactic Acid, Venous: 2.1 mmol/L (ref 0.5–1.9)

## 2018-07-24 LAB — URINALYSIS, ROUTINE W REFLEX MICROSCOPIC
Bilirubin Urine: UNDETERMINED — AB
Glucose, UA: UNDETERMINED mg/dL — AB
Hgb urine dipstick: UNDETERMINED — AB
Ketones, ur: UNDETERMINED mg/dL — AB
Leukocytes,Ua: UNDETERMINED — AB
Nitrite: UNDETERMINED — AB
Protein, ur: UNDETERMINED mg/dL — AB
Specific Gravity, Urine: UNDETERMINED (ref 1.005–1.030)
pH: UNDETERMINED (ref 5.0–8.0)

## 2018-07-24 LAB — CBC
HCT: 22.9 % — ABNORMAL LOW (ref 39.0–52.0)
Hemoglobin: 7.2 g/dL — ABNORMAL LOW (ref 13.0–17.0)
MCH: 28.3 pg (ref 26.0–34.0)
MCHC: 31.4 g/dL (ref 30.0–36.0)
MCV: 90.2 fL (ref 80.0–100.0)
Platelets: 369 10*3/uL (ref 150–400)
RBC: 2.54 MIL/uL — ABNORMAL LOW (ref 4.22–5.81)
RDW: 15.5 % (ref 11.5–15.5)
WBC: 18.9 10*3/uL — ABNORMAL HIGH (ref 4.0–10.5)
nRBC: 0 % (ref 0.0–0.2)

## 2018-07-24 LAB — COMPREHENSIVE METABOLIC PANEL
ALT: 11 U/L (ref 0–44)
AST: 17 U/L (ref 15–41)
Albumin: 1.9 g/dL — ABNORMAL LOW (ref 3.5–5.0)
Alkaline Phosphatase: 79 U/L (ref 38–126)
Anion gap: 10 (ref 5–15)
BUN: 33 mg/dL — ABNORMAL HIGH (ref 6–20)
CO2: 23 mmol/L (ref 22–32)
Calcium: 8.9 mg/dL (ref 8.9–10.3)
Chloride: 99 mmol/L (ref 98–111)
Creatinine, Ser: 2.61 mg/dL — ABNORMAL HIGH (ref 0.61–1.24)
GFR calc Af Amer: 30 mL/min — ABNORMAL LOW (ref >60)
GFR calc non Af Amer: 26 mL/min — ABNORMAL LOW (ref >60)
Glucose, Bld: 245 mg/dL — ABNORMAL HIGH (ref 70–99)
Potassium: 4 mmol/L (ref 3.5–5.1)
Sodium: 132 mmol/L — ABNORMAL LOW (ref 135–145)
Total Bilirubin: 0.4 mg/dL (ref 0.3–1.2)
Total Protein: 6.3 g/dL — ABNORMAL LOW (ref 6.5–8.1)

## 2018-07-24 LAB — URINALYSIS, MICROSCOPIC (REFLEX)
Squamous Epithelial / HPF: NONE SEEN (ref 0–5)
WBC, UA: >50 WBC/hpf (ref 0–5)

## 2018-07-24 LAB — APTT: aPTT: 29 seconds (ref 24–36)

## 2018-07-24 LAB — MRSA PCR SCREENING: MRSA by PCR: NEGATIVE

## 2018-07-24 LAB — PREPARE RBC (CROSSMATCH)

## 2018-07-24 LAB — PROTIME-INR
INR: 1.6 — ABNORMAL HIGH (ref 0.8–1.2)
Prothrombin Time: 19 seconds — ABNORMAL HIGH (ref 11.4–15.2)

## 2018-07-24 LAB — PHOSPHORUS: Phosphorus: 2.6 mg/dL (ref 2.5–4.6)

## 2018-07-24 LAB — PROCALCITONIN: Procalcitonin: 3.57 ng/mL

## 2018-07-24 LAB — SARS CORONAVIRUS 2 BY RT PCR (HOSPITAL ORDER, PERFORMED IN ~~LOC~~ HOSPITAL LAB): SARS Coronavirus 2: NEGATIVE

## 2018-07-24 LAB — GLUCOSE, CAPILLARY
Glucose-Capillary: 138 mg/dL — ABNORMAL HIGH (ref 70–99)
Glucose-Capillary: 224 mg/dL — ABNORMAL HIGH (ref 70–99)

## 2018-07-24 LAB — MAGNESIUM: Magnesium: 2 mg/dL (ref 1.7–2.4)

## 2018-07-24 MED ORDER — SODIUM CHLORIDE 0.9 % IV SOLN
10.0000 mL/h | Freq: Once | INTRAVENOUS | Status: AC
  Administered 2018-07-24: 10 mL/h via INTRAVENOUS

## 2018-07-24 MED ORDER — SODIUM CHLORIDE 0.9 % IV SOLN
2.0000 g | Freq: Once | INTRAVENOUS | Status: AC
  Administered 2018-07-24: 2 g via INTRAVENOUS
  Filled 2018-07-24: qty 20

## 2018-07-24 MED ORDER — LACTATED RINGERS IV SOLN
INTRAVENOUS | Status: DC
  Administered 2018-07-24 – 2018-07-26 (×3): via INTRAVENOUS

## 2018-07-24 MED ORDER — VANCOMYCIN HCL IN DEXTROSE 1-5 GM/200ML-% IV SOLN
1000.0000 mg | Freq: Once | INTRAVENOUS | Status: AC
  Administered 2018-07-24: 10:00:00 1000 mg via INTRAVENOUS
  Filled 2018-07-24: qty 200

## 2018-07-24 MED ORDER — OXYCODONE HCL 5 MG PO TABS
30.0000 mg | ORAL_TABLET | Freq: Four times a day (QID) | ORAL | Status: DC | PRN
  Administered 2018-07-24 – 2018-07-28 (×7): 30 mg via ORAL
  Filled 2018-07-24 (×7): qty 6

## 2018-07-24 MED ORDER — SODIUM CHLORIDE 0.9 % IV SOLN
2.0000 g | INTRAVENOUS | Status: DC
  Administered 2018-07-24: 2 g via INTRAVENOUS
  Filled 2018-07-24: qty 2

## 2018-07-24 MED ORDER — VANCOMYCIN HCL 10 G IV SOLR: 1250.0000 mg | INTRAVENOUS | Status: DC

## 2018-07-24 MED ORDER — PANTOPRAZOLE SODIUM 40 MG IV SOLR
40.0000 mg | Freq: Every day | INTRAVENOUS | Status: DC
  Administered 2018-07-24 – 2018-07-28 (×5): 40 mg via INTRAVENOUS
  Filled 2018-07-24 (×5): qty 40

## 2018-07-24 MED ORDER — SODIUM CHLORIDE 0.9% FLUSH
10.0000 mL | INTRAVENOUS | Status: DC | PRN
  Administered 2018-07-29 (×2): 10 mL
  Filled 2018-07-24 (×2): qty 40

## 2018-07-24 MED ORDER — METRONIDAZOLE IN NACL 5-0.79 MG/ML-% IV SOLN
500.0000 mg | Freq: Once | INTRAVENOUS | Status: AC
  Administered 2018-07-24: 500 mg via INTRAVENOUS
  Filled 2018-07-24: qty 100

## 2018-07-24 MED ORDER — SODIUM CHLORIDE 0.9 % IV BOLUS
1000.0000 mL | Freq: Once | INTRAVENOUS | Status: AC
  Administered 2018-07-24: 10:00:00 1000 mL via INTRAVENOUS

## 2018-07-24 MED ORDER — CHLORHEXIDINE GLUCONATE CLOTH 2 % EX PADS
6.0000 | MEDICATED_PAD | Freq: Every day | CUTANEOUS | Status: DC
  Administered 2018-07-24 – 2018-07-28 (×6): 6 via TOPICAL

## 2018-07-24 MED ORDER — SODIUM CHLORIDE 0.9 % IV BOLUS
1000.0000 mL | Freq: Once | INTRAVENOUS | Status: AC
  Administered 2018-07-24: 09:00:00 1000 mL via INTRAVENOUS

## 2018-07-24 MED ORDER — BACLOFEN 10 MG PO TABS
20.0000 mg | ORAL_TABLET | Freq: Three times a day (TID) | ORAL | Status: DC
  Administered 2018-07-24 – 2018-07-29 (×15): 20 mg via ORAL
  Filled 2018-07-24 (×15): qty 2

## 2018-07-24 MED ORDER — ROPINIROLE HCL 0.5 MG PO TABS
0.2500 mg | ORAL_TABLET | Freq: Every day | ORAL | Status: DC
  Administered 2018-07-24 – 2018-07-28 (×5): 0.25 mg via ORAL
  Filled 2018-07-24 (×6): qty 1

## 2018-07-24 MED ORDER — NOREPINEPHRINE 4 MG/250ML-% IV SOLN
0.0000 ug/min | INTRAVENOUS | Status: DC
  Administered 2018-07-24 (×2): 20 ug/min via INTRAVENOUS
  Administered 2018-07-24: 23:00:00 9 ug/min via INTRAVENOUS
  Filled 2018-07-24 (×4): qty 250

## 2018-07-24 MED ORDER — ONDANSETRON HCL 4 MG/2ML IJ SOLN
4.0000 mg | Freq: Once | INTRAMUSCULAR | Status: AC
  Administered 2018-07-24: 14:00:00 4 mg via INTRAVENOUS
  Filled 2018-07-24: qty 2

## 2018-07-24 MED ORDER — ACETAMINOPHEN 325 MG PO TABS
650.0000 mg | ORAL_TABLET | ORAL | Status: DC | PRN
  Administered 2018-07-24 – 2018-07-26 (×4): 650 mg via ORAL
  Filled 2018-07-24 (×6): qty 2

## 2018-07-24 MED ORDER — ONDANSETRON HCL 4 MG/2ML IJ SOLN
4.0000 mg | Freq: Four times a day (QID) | INTRAMUSCULAR | Status: DC | PRN
  Administered 2018-07-25 – 2018-07-27 (×3): 4 mg via INTRAVENOUS
  Filled 2018-07-24 (×3): qty 2

## 2018-07-24 MED ORDER — SODIUM CHLORIDE 0.9% FLUSH
10.0000 mL | Freq: Two times a day (BID) | INTRAVENOUS | Status: DC
  Administered 2018-07-24 – 2018-07-28 (×6): 10 mL

## 2018-07-24 MED ORDER — INSULIN ASPART 100 UNIT/ML ~~LOC~~ SOLN
0.0000 [IU] | SUBCUTANEOUS | Status: DC
  Administered 2018-07-24: 20:00:00 3 [IU] via SUBCUTANEOUS
  Administered 2018-07-24: 17:00:00 1 [IU] via SUBCUTANEOUS
  Administered 2018-07-24: 14:00:00 3 [IU] via SUBCUTANEOUS
  Administered 2018-07-25: 1 [IU] via SUBCUTANEOUS
  Administered 2018-07-25: 2 [IU] via SUBCUTANEOUS
  Administered 2018-07-25: 1 [IU] via SUBCUTANEOUS
  Administered 2018-07-26: 2 [IU] via SUBCUTANEOUS
  Administered 2018-07-26: 12:00:00 3 [IU] via SUBCUTANEOUS
  Administered 2018-07-26: 1 [IU] via SUBCUTANEOUS
  Administered 2018-07-26: 3 [IU] via SUBCUTANEOUS
  Administered 2018-07-26: 20:00:00 2 [IU] via SUBCUTANEOUS
  Administered 2018-07-27: 17:00:00 1 [IU] via SUBCUTANEOUS
  Administered 2018-07-27 (×2): 2 [IU] via SUBCUTANEOUS
  Administered 2018-07-28: 09:00:00 1 [IU] via SUBCUTANEOUS
  Administered 2018-07-28: 2 [IU] via SUBCUTANEOUS
  Administered 2018-07-28: 13:00:00 3 [IU] via SUBCUTANEOUS
  Administered 2018-07-28: 2 [IU] via SUBCUTANEOUS
  Administered 2018-07-28: 17:00:00 3 [IU] via SUBCUTANEOUS
  Administered 2018-07-28 – 2018-07-29 (×2): 1 [IU] via SUBCUTANEOUS
  Administered 2018-07-29 (×2): 3 [IU] via SUBCUTANEOUS
  Administered 2018-07-29: 1 [IU] via SUBCUTANEOUS

## 2018-07-24 NOTE — ED Provider Notes (Signed)
[facility] Emergency Department Provider Note MRN:  [id number]  Arrival date & time: 07/24/18     Chief Complaint   Fall   History of Present Illness   Andrew Joyce is a 58 y.o. year-old male with a history of CKD, CAD, bladder cancer presenting to the ED with chief complaint of fall.  2 falls today while in the bathroom.  Endorsing head trauma, no loss consciousness.  Patient has fever, report of infection to his growing.  History of bladder cancer, due for chemotherapy today.  I was unable to obtain an accurate HPI, PMH, or ROS due to the patient's altered mental status.  Review of Systems  A complete 10 system review of systems was obtained and all systems are negative except as noted in the HPI and PMH.   Patient's Health History    Past Medical History:  Diagnosis Date  . 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 Cancer ([place])   . Chronic kidney disease (CKD), stage II (mild)   . Congenital birth defect   . Coronary artery disease    a. s/p CABG in 2008; b.  s/p cath in 2011 for med rx.;  c.  NSTEMI (02/2011):  LHC (02/2011):  LAD occluded, distal LAD filled via left to left collaterals, distal CFX 40-50%, proximal RCA occluded (right to right collaterals), distal RCA occluded, S-RCA occluded proximally, S-OM1/OM2 occluded (new from 2011 - culprit), L-LAD ok, ant apical HK, EF 45-50% - med Rx.  [name] Dyslipidemia   . History of renal stone   . Hx of cardiovascular stress test    Nuclear (06/2009):  EF 71%, small area of infarct/ischemia in inferior wall-unchanged from prior  . Hx of echocardiogram    Echo (05/2013):  EF 55-60%, inf HK, mild AI, normal RVSF, RVSP 28 mmHg  . Hypertension   . Pancreatitis   . Recurrent UTI     Past Surgical History:  Procedure Laterality Date  . CARDIAC CATHETERIZATION  07/28/2009   EF 60%; Grafts patent except for SVG to the AM and PD. Native RCA is occluded.  [name] CARDIAC CATHETERIZATION   03/20/2006   EF 60-65%  . CARDIOVASCULAR STRESS TEST  06/12/2009   EF 71%, SMALL AREA OF INFARCT/ISCHEMIA IN INFERIOR WALL; FELT TO NOT BE CHANGED.  [name] CORONARY ARTERY BYPASS GRAFT  03/2006   LIMA GRAFT TO LAD, SAPHENOUS VEIN GRAFT TO THE ACUTE MARGINAL BRANCH THE RIGHT CORONARY, SAPHENOUS VEIN GRAFT TO THE PDA, SEQUENTIAL VEIN GRAFT TO THE FIRST AND SECOND OBTUSE MARGINAL VESSELS  . IR IMAGING GUIDED PORT INSERTION  09/19/2017  . IR NEPHROSTOMY EXCHANGE LEFT  03/22/2018  . IR NEPHROSTOMY EXCHANGE LEFT  05/08/2018  . IR NEPHROSTOMY EXCHANGE LEFT  05/21/2018  . IR NEPHROSTOMY EXCHANGE LEFT  06/09/2018  . IR NEPHROSTOMY EXCHANGE LEFT  06/30/2018  . IR NEPHROSTOMY EXCHANGE RIGHT  03/22/2018  . IR NEPHROSTOMY EXCHANGE RIGHT  05/21/2018  . IR NEPHROSTOMY EXCHANGE RIGHT  06/09/2018  . IR NEPHROSTOMY TUBE CHANGE  04/22/2018  . KIDNEY SURGERY    . LEFT HEART CATHETERIZATION WITH CORONARY ANGIOGRAM N/A 02/26/2011   Procedure: LEFT HEART CATHETERIZATION WITH CORONARY ANGIOGRAM;  Surgeon: Thayer Headings, MD;  Location: [facility] CATH LAB;  Service: Cardiovascular;  Laterality: N/A;    Family History  Problem Relation Age of Onset  . Hyperlipidemia Mother        Living, 38  . Hypertension Mother   . Hyperlipidemia Father  Living 80  . Hypertension Father   . Healthy Sister   . Healthy Brother     Social History   Socioeconomic History  . Marital status: Married    Spouse name: Not on file  . Number of children: Not on file  . Years of education: Not on file  . Highest education level: Not on file  Occupational History  . Not on file  Social Needs  . Financial resource strain: Not on file  . Food insecurity    Worry: Not on file    Inability: Not on file  . Transportation needs    Medical: No    Non-medical: No  Tobacco Use  . Smoking status: Never Smoker  . Smokeless tobacco: Never Used  Substance and Sexual Activity  . Alcohol use: No    Alcohol/week: 0.0 standard drinks  . Drug use: No  .  Sexual activity: Yes  Lifestyle  . Physical activity    Days per week: Not on file    Minutes per session: Not on file  . Stress: Not on file  Relationships  . Social Herbalist on phone: Not on file    Gets together: Not on file    Attends religious service: Not on file    Active member of club or organization: Not on file    Attends meetings of clubs or organizations: Not on file    Relationship status: Not on file  . Intimate partner violence    Fear of current or ex partner: Not on file    Emotionally abused: Not on file    Physically abused: Not on file    Forced sexual activity: Not on file  Other Topics Concern  . Not on file  Social History Narrative   Lives with wife and 2 children in a 2 story home.     Education: college.     Retired Physiological scientist.      Physical Exam  Vital Signs and Nursing Notes reviewed Vitals:   07/24/18 0857 07/24/18 1015  BP: (!) 80/53 (!) 65/44  Pulse: 87 (!) 58  Resp: 10 12  Temp: 100 F (37.8 C)   SpO2: 98% 98%    CONSTITUTIONAL: Ill-appearing, NAD NEURO: Somnolent, wakes to voice, oriented to name, no focal deficits EYES:  eyes equal and reactive ENT/NECK:  no LAD, no JVD CARDIO: Regular rate, well-perfused, normal S1 and S2 PULM:  CTAB no wheezing or rhonchi GI/GU:  normal bowel sounds, non-distended, non-tender MSK/SPINE:  No gross deformities, no edema SKIN: Fungating and foul-smelling purulent mass to the suprapubic region [facility]:  Appropriate speech and behavior  Diagnostic and Interventional Summary    EKG Interpretation  Date/Time:  Friday July 24 2018 08:51:47 EDT Ventricular Rate:  88 PR Interval:    QRS Duration: 100 QT Interval:  380 QTC Calculation: 460 R Axis:   45 Text Interpretation:  Sinus rhythm Confirmed by Gerlene Fee [id number]) on 07/24/2018 10:43:24 AM      Labs Reviewed  COMPREHENSIVE METABOLIC PANEL - Abnormal; Notable for the following components:      Result Value   Sodium 132  (*)    Glucose, Bld 245 (*)    BUN 33 (*)    Creatinine, Ser 2.61 (*)    Total Protein 6.3 (*)    Albumin 1.9 (*)    GFR calc non Af Amer 26 (*)    GFR calc Af Amer 30 (*)    All other components within  normal limits  CBC WITH DIFFERENTIAL/PLATELET - Abnormal; Notable for the following components:   WBC 13.0 (*)    RBC 2.24 (*)    Hemoglobin 6.4 (*)    HCT 21.0 (*)    RDW 15.9 (*)    Neutro Abs 11.5 (*)    Lymphs Abs 0.4 (*)    Abs Immature Granulocytes 0.17 (*)    All other components within normal limits  PROTIME-INR - Abnormal; Notable for the following components:   Prothrombin Time 19.0 (*)    INR 1.6 (*)    All other components within normal limits  CBG MONITORING, ED - Abnormal; Notable for the following components:   Glucose-Capillary 236 (*)    All other components within normal limits  CULTURE, BLOOD (ROUTINE X 2)  CULTURE, BLOOD (ROUTINE X 2)  URINE CULTURE  SARS CORONAVIRUS 2 (HOSPITAL ORDER, [place] [facility])  LACTIC ACID, PLASMA  APTT  LACTIC ACID, PLASMA  URINALYSIS, ROUTINE W REFLEX MICROSCOPIC  PREPARE RBC (CROSSMATCH)    DG Chest Port 1 View  Final Result    CT HEAD WO CONTRAST    (Results Pending)  CT CERVICAL SPINE WO CONTRAST    (Results Pending)  CT ABDOMEN PELVIS WO CONTRAST    (Results Pending)    Medications  vancomycin (VANCOCIN) IVPB 1000 mg/200 mL premix (0 mg Intravenous Incomplete 07/24/18 1019)  norepinephrine (LEVOPHED) 4mg  in 255mL premix infusion (has no administration in time range)  0.9 %  sodium chloride infusion (has no administration in time range)  sodium chloride 0.9 % bolus 1,000 mL (1,000 mLs Intravenous New Bag/Given 07/24/18 0939)  sodium chloride 0.9 % bolus 1,000 mL (0 mLs Intravenous Stopped 07/24/18 1035)  cefTRIAXone (ROCEPHIN) 2 g in sodium chloride 0.9 % 100 mL IVPB (2 g Intravenous New Bag/Given 07/24/18 5643)     Procedures Critical Care Critical Care Documentation Critical care time provided  by me (excluding procedures): 35 minutes  Condition necessitating critical care: Septic shock  Components of critical care management: reviewing of prior records, laboratory and imaging interpretation, frequent re-examination and reassessment of vital signs, administration of IV fluids, IV antibiotics, discussion with consulting services.    ED Course and Medical Decision Making  I have reviewed the triage vital signs and the nursing notes.  Pertinent labs & imaging results that were available during my care of the patient were reviewed by me and considered in my medical decision making (see below for details).  Concern for sepsis, question of urinary versus soft tissue origin, providing fluids, vancomycin, ceftriaxone, plan for admission.  Given the patient is also with altered mental status after a ground-level fall, will obtain CT imaging of the head and neck.  Given the concerning fungating mass to the suprapubic region, will CT abdomen.  Patient becoming more and more hypotensive, at one point becoming unresponsive with SBP in the 60s.  With norepi drip improved his mentation after SBP improved to 120s.  Requiring norepi at 10-12 mcg/min, admitted to ICU for further care.  Barth Kirks. Sedonia Small, [place] [email or web address]  Final Clinical Impressions(s) / ED Diagnoses     ICD-10-CM   1. Sepsis, due to unspecified organism, unspecified whether acute organ dysfunction present ([place])  A41.9   2. Shock ([place])  R57.9   3. Symptomatic anemia  D64.9     ED Discharge Orders    None         Maudie Flakes, MD 07/25/18  0703  

## 2018-07-24 NOTE — H&P (Addendum)
NAME:  Andrew Joyce, MRN:  [id number], DOB:  06/14/1960, LOS: 0 ADMISSION DATE:  07/24/2018, CONSULTATION DATE:  07/24/2018 REFERRING MD: Sedonia Small  CHIEF COMPLAINT:  Fevers, fall, Hypotension   Brief History   Andrew Joyce is a 58 y.o. year-old male with a history of CKD, CAD, Stage IV bladder cancer diagnosed in 2019 with pelvic lymph node involvement  with biopsy proven to be high-grade urothelial carcinoma with sarcomatoid features. He  presented to the ED with chief complaint of fall.He fell in the bathroom x 2 today, endorsing head trauma, no loss consciousness.  Patient has fever that he states has been ongoing x 2 weeks, up to 102. Per EMS he has a fissure to his right groin that had become infected. He endorses poor appetite x 1 week. He was due for chemo today. In the ED he was hypotensive , HBG was 6.4. He was started on pressors for BP and CCM were called to admit to ICU and manage care.   History of present illness   Andrew Joyce is a 58 y.o. year-old male with a history of CKD, CAD, DM Stage IV bladder cancer diagnosed in 2019 with pelvic lymph node involvement  with biopsy proven to be high-grade urothelial carcinoma with sarcomatoid features. He  presented to the ED with chief complaint of fall.He fell in the bathroom x 2 today, endorsing head trauma, no loss consciousness.  Patient has fever that he states has been ongoing x 2 weeks, up to 102. Per EMS he has a fissure to his right groin that had become infected. He endorses poor appetite x 1 week. He was due for chemo today.Last chemo was 06/26/2018. Regimen was [id number]: Pembrolizumab 200 mg . This regimen was discontinued 7/13 due to disease progression. He was to have his first dose cycle of Enfortumab vedotin-ejfv 7/17 ( today). This is a non-curative therapy with palliative intent.   In the ED he was hypotensive, received  3 L of NS. ,HBG was 6.4. ( 6/19 patient received 1 unit of blood for HGB 6.9) .1 unit of  blood has been ordered. INR is 1.6.He denies any blood in his stools. He was started on Levophed  for BP . WBC was 13,000,  Lactate was 1.4 , fever 100 ( received tylenol per EMS)  He endorses poor appetite x 1 week. Albumin is 1.9. Blood and urine cultures have been ordered and are pending. Pt. does have bilateral nephrostomy tubes.  Imaging for brain was negative for any acute events. New wall thickening was noted in his gall bladder. Pt. Has been a full  code during his 2 previous hospitalizations. When asked today, his desire is to remain a full code at present.  PCCM will admit to ICU for further care and monitoring. If he declines, goals of care will need to be re-evaluated,.   Of note, patient was born with bladder exstrophy, and bladder was returned to abdomen  at age of 81 years.  Past Medical History   Past Medical History:  Diagnosis Date  . 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 Cancer ([place])   . Chronic kidney disease (CKD), stage II (mild)   . Congenital birth defect   . Coronary artery disease    a. s/p CABG in 2008; b.  s/p cath in 2011 for med rx.;  c.  NSTEMI (02/2011):  LHC (02/2011):  LAD occluded, distal LAD filled via left to left  collaterals, distal CFX 40-50%, proximal RCA occluded (right to right collaterals), distal RCA occluded, S-RCA occluded proximally, S-OM1/OM2 occluded (new from 2011 - culprit), L-LAD ok, ant apical HK, EF 45-50% - med Rx.  [name] Dyslipidemia   . History of renal stone   . Hx of cardiovascular stress test    Nuclear (06/2009):  EF 71%, small area of infarct/ischemia in inferior wall-unchanged from prior  . Hx of echocardiogram    Echo (05/2013):  EF 55-60%, inf HK, mild AI, normal RVSF, RVSP 28 mmHg  . Hypertension   . Pancreatitis   . Recurrent UTI     Significant Hospital Events   06/26/2018>> Last Chemo 07/24/2018 Admission  Consults:  PCCM  Procedures:  07/24/2018: Port a Cath accessed  Significant Diagnostic  Tests:  07/24/2018  CT cervical Spine/ CT Head  No acute intracranial abnormality. No acute cervical spine fracture. C3-C4 and C6-C7 spondylosis as described above. New mild interlobular septal thickening in the lung apices, consistent with interstitial pulmonary edema.  07/24/2018 CT Abdomen and Pelvis without Contrast No abscess is identified. The gallbladder is decompressed with wall thickening which is new since the most recent CT scan. Right upper quadrant ultrasound could be used for further evaluation. Bladder exstrophy with a large bladder mass as seen on the prior examination. Locules of gas in the bladder are chronic. No change in lymphadenopathy since the most recent exam likely due to metastatic disease.  07/24/2018>> RUQ US>> pending   Micro Data:  7/17>> Blood 7/17>> Urine   Antimicrobials:  Rocephin 7/17>> Flagyl 7/17>> Vanc 7/17>>  Interim history/subjective:  Pt is currently stable in 12 mcg of Levophed. Oxygen sats are  100% on RA. He has not yet received blood. He is nauseated, and tired. He is very weak  Objective   Blood pressure 125/67, pulse (!) 48, temperature 100 F (37.8 C), resp. rate 14, SpO2 99 %.        Intake/Output Summary (Last 24 hours) at 07/24/2018 1336 Last data filed at 07/24/2018 1121 Gross per 24 hour  Intake 1100 ml  Output -  Net 1100 ml   There were no vitals filed for this visit.  Examination: General: Thin, pale, male appears weak, and fatigued , in NAD on RA HENT: NCAT, No LAD, Port a cath right chest wall, no redness or drainage noted  Lungs: Bilateral chest excursion, Clear to auscultation Cardiovascular: S1, S2, RRR, No RMG noted Abdomen: Soft , flat , ND, NT, BS diminished, flatus Extremities: No obvious deformities, MAE x 4 weakly Neuro: Awake and alert,MAE x 4, A&O x 3, appropriate GU: Bilateral nephrostomy tubes  with turbid urine in adequate amounts. Suprapubic bladder to abdominal wall fistulla with  yellow drainage and foul odor  Resolved Hospital Problem list     Assessment & Plan:  Suspected Septic Shock -immunocompromised host -consider FUO (pt reports fever x 2 wks >102) -Suprapubic bladder to abdominal wall fistulla with yellow drainage and foul odor  - born with bladder exstrophy, and bladder was returned to abdomen  at age of 2 years. -BCx, UCx sent P Follow up culture data Consider sending culture from port  Broad spectrum abx started in ED Trend WBC, temp curve  MAP goal > 65, titrated NE for goal  Tylenol for fever  AKI on CKD -likely multifactorial etiology in setting of Poor PO intake x 1-2 weeks, acute hypoperfusion requiring pressors in ED, suspected sepsis  P LR 50/hr  Trend BMP Strict I/O Avoid nephrotoxic medications Maintain  renal perfulsion   Stage IV bladder cancer, diagnosed 2019, with worsening pelvic lymphadenopathy on CT. -high grade urothelial carcinoma with sarcomatoid features -is s/p definitive therapy (s/p TURBT 2019, s/p 3x chemo 2019, s/p radiation feb 2020, now doing palliative chemo)  -Followed by Dr. Thurston Pounds -Will notify Dr. Alen Blew of admission    Gallbladder wall thickening P RUQ [place]   Anemia -acute anemia (?blood loss) vs chronic  Coagulopathy, mild  -INR 1.6 (previous in 03/2018 WNL) P Heme occult  1 PRBC  Follow up CBC, however broadly do favor minimizing lab draws as able  Holding chemical VTE ppx at this time.   DM2 P Sensitive SSI  CBG Q 4  Hyponatremia P LR  Trend BMP  Muscle Twitching seen by Posey Pronto, neuro, who indicated pt to stop carbamazepine, consider paraneoplastic panel. Possible SE of pembrolizumab Cramp-fasciculation syndrome  RLS P Restart home baclofen  Ropinitole 25mg  qHS  Check mag and phos   Nausea P Zofran prn  Protein Calorie Malnutrition -acute inadequate PO intake P NPO at present, Consider RDN consult when more stable and confident patient is not at risk for intubation in  setting of acute decompensation  Best practice:  Diet: NPO Pain/Anxiety/Delirium protocol (if indicated): NA VAP protocol (if indicated): NA DVT prophylaxis: PAS for now ( HGB is 6.4) GI prophylaxis: Protonix Glucose control: SSI, CBG Q 4 Mobility:  BR for now Code Status: Full Family Communication: Pending Per MD Disposition: ICU  Labs   CBC: Recent Labs  Lab 07/24/18 0935  WBC 13.0*  NEUTROABS 11.5*  HGB 6.4*  HCT 21.0*  MCV 93.8  PLT 536    Basic Metabolic Panel: Recent Labs  Lab 07/24/18 0935  NA 132*  K 4.0  CL 99  CO2 23  GLUCOSE 245*  BUN 33*  CREATININE 2.61*  CALCIUM 8.9   GFR: CrCl cannot be calculated (Unknown ideal weight.). Recent Labs  Lab 07/24/18 0935  WBC 13.0*  LATICACIDVEN 1.4    Liver Function Tests: Recent Labs  Lab 07/24/18 0935  AST 17  ALT 11  ALKPHOS 79  BILITOT 0.4  PROT 6.3*  ALBUMIN 1.9*   No results for input(s): LIPASE, AMYLASE in the last 168 hours. No results for input(s): AMMONIA in the last 168 hours.  ABG No results found for: PHART, PCO2ART, PO2ART, HCO3, TCO2, ACIDBASEDEF, O2SAT   Coagulation Profile: Recent Labs  Lab 07/24/18 0935  INR 1.6*    Cardiac Enzymes: No results for input(s): CKTOTAL, CKMB, CKMBINDEX, TROPONINI in the last 168 hours.  HbA1C: Hgb A1c MFr Bld  Date/Time Value Ref Range Status  03/22/2018 05:27 AM 5.8 (H) 4.8 - 5.6 % Final    Comment:    (NOTE) Pre diabetes:          5.7%-6.4% Diabetes:              >6.4% Glycemic control for   <7.0% adults with diabetes   07/24/2017 09:42 AM 7.7 (H) 4.8 - 5.6 % Final    Comment:             Prediabetes: 5.7 - 6.4          Diabetes: >6.4          Glycemic control for adults with diabetes: <7.0     CBG: Recent Labs  Lab 07/24/18 0855  GLUCAP 236*    Review of Systems:   Gen: + fever, + chills, + weight change, +fatigue, night sweats HEENT: Denies blurred vision, double  vision, hearing loss, tinnitus, sinus congestion,  rhinorrhea, sore throat, neck stiffness, dysphagia PULM: Denies shortness of breath, cough, sputum production, hemoptysis, wheezing CV: Denies chest pain, edema, orthopnea, paroxysmal nocturnal dyspnea, palpitations GI: Denies abdominal pain, + nausea, No vomiting, diarrhea, hematochezia, melena, constipation, change in bowel habits GU: Denies dysuria, hematuria, polyuria, oliguria, urethral discharge Endocrine: Denies hot or cold intolerance, polyuria, polyphagia or appetite change Derm: Denies rash, dry skin, scaling or peeling skin change Heme: Denies easy bruising, bleeding, bleeding gums Neuro: Denies headache, numbness, weakness, slurred speech, loss of memory or consciousness  Past Medical History  He,  has a past medical history of Bladder exstrophy, Cancer ([place]), Chronic kidney disease (CKD), stage II (mild), Congenital birth defect, Coronary artery disease, Dyslipidemia, History of renal stone, cardiovascular stress test, echocardiogram, Hypertension, Pancreatitis, and Recurrent UTI.   Surgical History    Past Surgical History:  Procedure Laterality Date  . CARDIAC CATHETERIZATION  07/28/2009   EF 60%; Grafts patent except for SVG to the AM and PD. Native RCA is occluded.  [name] CARDIAC CATHETERIZATION  03/20/2006   EF 60-65%  . CARDIOVASCULAR STRESS TEST  06/12/2009   EF 71%, SMALL AREA OF INFARCT/ISCHEMIA IN INFERIOR WALL; FELT TO NOT BE CHANGED.  [name] CORONARY ARTERY BYPASS GRAFT  03/2006   LIMA GRAFT TO LAD, SAPHENOUS VEIN GRAFT TO THE ACUTE MARGINAL BRANCH THE RIGHT CORONARY, SAPHENOUS VEIN GRAFT TO THE PDA, SEQUENTIAL VEIN GRAFT TO THE FIRST AND SECOND OBTUSE MARGINAL VESSELS  . IR IMAGING GUIDED PORT INSERTION  09/19/2017  . IR NEPHROSTOMY EXCHANGE LEFT  03/22/2018  . IR NEPHROSTOMY EXCHANGE LEFT  05/08/2018  . IR NEPHROSTOMY EXCHANGE LEFT  05/21/2018  . IR NEPHROSTOMY EXCHANGE LEFT  06/09/2018  . IR NEPHROSTOMY EXCHANGE LEFT  06/30/2018  . IR NEPHROSTOMY EXCHANGE RIGHT  03/22/2018  . IR  NEPHROSTOMY EXCHANGE RIGHT  05/21/2018  . IR NEPHROSTOMY EXCHANGE RIGHT  06/09/2018  . IR NEPHROSTOMY TUBE CHANGE  04/22/2018  . KIDNEY SURGERY    . LEFT HEART CATHETERIZATION WITH CORONARY ANGIOGRAM N/A 02/26/2011   Procedure: LEFT HEART CATHETERIZATION WITH CORONARY ANGIOGRAM;  Surgeon: Thayer Headings, MD;  Location: [facility] C[facility] LAB;  Service: Cardiovascular;  Laterality: N/A;     Social History   reports that he has never smoked. He has never used smokeless tobacco. He reports that he does not drink alcohol or use drugs.   Family History   His family history includes Healthy in his brother and sister; Hyperlipidemia in his father and mother; Hypertension in his father and mother.   Allergies No Known Allergies   Home Medications  Prior to Admission medications   Medication Sig Start Date End Date Taking? Authorizing Provider  carbamazepine (CARBATROL) 100 MG 12 hr capsule Take 1 capsule (100 mg total) by mouth 2 (two) times daily. 05/13/18  Yes Patel, Donika K, DO  DULoxetine (CYMBALTA) 20 MG capsule Take 20 mg by mouth daily.   Yes [provider]  fenofibrate 160 MG tablet Take 1 tablet by mouth once daily 05/19/18  Yes [place], Peter M, MD  gabapentin (NEURONTIN) 300 MG capsule Take 300 mg by mouth at bedtime. 07/10/18  Yes [provider]  nitroGLYCERIN (NITROSTAT) 0.4 MG SL tablet Place 1 tablet (0.4 mg total) under the tongue every 5 (five) minutes as needed. For chest pain 06/20/16  Yes [place], Peter M, MD  oxycodone (ROXICODONE) 30 MG immediate release tablet Take 30 mg by mouth every 6 (six) hours as needed (pain).  12/08/17  Yes [provider]  POTASSIUM CITRATE PO Take 15 mEq by mouth daily. 1620   Yes [provider]  rOPINIRole (REQUIP) 0.25 MG tablet Take 1 tablet (0.25 mg total) by mouth at bedtime. TAKE 1 TABLET BY MOUTH 3 HOURS BEFORE BEDTIME 05/13/18  Yes Narda Amber K, DO  rosuvastatin (CRESTOR) 40 MG tablet Take 1 tablet by mouth once daily  07/07/18  Yes [place], Peter M, MD  senna-docusate (SENNA S) 8.6-50 MG tablet Take 1 tablet by mouth daily. 06/05/18  Yes Wyatt Portela, MD  zolpidem (AMBIEN) 5 MG tablet Take 5 mg by mouth at bedtime.    Yes [provider]  baclofen (LIORESAL) 10 MG tablet TAKE 2 TABLETS BY MOUTH THREE TIMES DAILY Patient not taking: No sig reported 02/03/18   Narda Amber K, DO  glucose blood (ACCU-CHEK GUIDE) test strip Use to check blood sugars 2 times daily. Dx Code: E11.9 08/11/17   [name], NP  isosorbide mononitrate (IMDUR) 60 MG 24 hr tablet TAKE 1 & 1/2 (ONE & ONE-HALF) TABLETS BY MOUTH ONCE DAILY Patient not taking: No sig reported 06/11/17   [place], Peter M, MD  meloxicam (MOBIC) 15 MG tablet Take 1 tablet (15 mg total) by mouth daily. Patient not taking: Reported on 07/24/2018 10/29/16   Gardiner Barefoot, DPM  sitaGLIPtin (JANUVIA) 50 MG tablet Take 1 tablet (50 mg total) by mouth daily. Patient not taking: Reported on 07/24/2018 05/05/18   Marrian Salvage, FNP  triamcinolone ointment (KENALOG) 0.5 % Apply 1 application topically 2 (two) times daily. Patient not taking: Reported on 07/24/2018 06/05/18   Wyatt Portela, MD     Critical care time: 41 minutes    Magdalen Spatz, AGACNP-BC [place] Pager # [phone] After 4 pm please call [phone] 07/24/2018 2:16 PM

## 2018-07-24 NOTE — ED Triage Notes (Addendum)
Coming from home. Wife called EMS for a fall in the bathroom pt could not get up unassisted. Bladder cancer patient, due for chemo today. Tremors bilaterally on all 4 extremities. Febrile on and off for past few weeks. Fissure on right groin that has become infected. EMS reports pt being altered, not knowing the year or where he was, with a delay in processing the answers to her questions.  EMS gave Tylenol in route to treat fever. Gave about 372mL NS in route  EMS vitals BP 95/60 HR 90 T 101.7 temporal CBG 253 R 16 97% RA

## 2018-07-24 NOTE — ED Notes (Signed)
CBG collected. Result "[id number]." RN, Hassan Rowan, notified.

## 2018-07-24 NOTE — Progress Notes (Deleted)
Cardiology Office Note   Date:  07/24/2018   ID:  [name], DOB Dec 07, 1960, MRN [id number]  PCP:  [name], NP  Cardiologist:  Dr.  Martinique      History of Present Illness: Andrew Joyce is a 58 y.o. male seen for follow up CAD. He has a hx of CAD s/p CABG in 2008, HTN, HL, CKD.  He suffered a non-STEMI in February 2013. Culprit was felt to be an occluded SVG-OM1/OM2. Native circumflex had 40-50% distal stenosis. There were no targets for revascularization and he was treated medically.   He has a history orthostatic intolerance. Imdur dose was reduced.  Echo May 2015 demonstrated EF 55-60%, mild inferior hypokinesis, top normal LA/RA size.Poorly visualized aortic valve, however, there is mild central AI - no stenosis.    He has bladder cancer diagnosed in 2019.  He has stage IV disease documented to pelvic lymph nodes with biopsy proven to be high-grade urothelial carcinoma with sarcomatoid features. Systemic chemotherapy utilizing gemcitabine and cisplatin cycle 1 started on September 16, 2017.  He completed 3 cycles of therapy in October 2019. Last month he was anemic with Hgb down to 6.9. transfused one unit PRBCs. Has nephrostomy tube in place.   He is status post definitive therapy with radiation completed to the pelvis on March 03, 2018.  On follow up today he is doing well from a cardiac standpoint. He denies any chest pain or need for Ntg.  He stays active- mainly with weight lifting. Works out every other day.  He does have recurrent UTIs and renal stones followed by urology. He has frequent calculi passing multiple stones a month. This has improved with something to increase his citrate level.  He underwent cystourethroscopy with ureteral stenting and resection of a bladder tumor yesterday.  He has a history of  severe hypertriglyceridemia and last labs in January showed elevated sugar.     Recent Labs: 06/05/2018: TSH 2.404 07/24/2018: ALT  11; Creatinine, Ser 2.61; Hemoglobin 6.4; Potassium 4.0  Wt Readings from Last 3 Encounters:  06/26/18 134 lb 12.8 oz (61.1 kg)  06/19/18 140 lb (63.5 kg)  06/05/18 136 lb 9.6 oz (62 kg)     Past Medical History:  Diagnosis Date  . 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 Cancer ([place])   . Chronic kidney disease (CKD), stage II (mild)   . Congenital birth defect   . Coronary artery disease    a. s/p CABG in 2008; b.  s/p cath in 2011 for med rx.;  c.  NSTEMI (02/2011):  LHC (02/2011):  LAD occluded, distal LAD filled via left to left collaterals, distal CFX 40-50%, proximal RCA occluded (right to right collaterals), distal RCA occluded, S-RCA occluded proximally, S-OM1/OM2 occluded (new from 2011 - culprit), L-LAD ok, ant apical HK, EF 45-50% - med Rx.  [name] Dyslipidemia   . History of renal stone   . Hx of cardiovascular stress test    Nuclear (06/2009):  EF 71%, small area of infarct/ischemia in inferior wall-unchanged from prior  . Hx of echocardiogram    Echo (05/2013):  EF 55-60%, inf HK, mild AI, normal RVSF, RVSP 28 mmHg  . Hypertension   . Pancreatitis   . Recurrent UTI     No current facility-administered medications for this visit.    Current Outpatient Medications  Medication Sig Dispense Refill  . baclofen (LIORESAL) 10 MG tablet TAKE 2 TABLETS BY MOUTH  THREE TIMES DAILY (Patient taking differently: Take 20 mg by mouth 3 (three) times daily. ) 180 each 5  . carbamazepine (CARBATROL) 100 MG 12 hr capsule Take 1 capsule (100 mg total) by mouth 2 (two) times daily. 180 capsule 3  . DULoxetine (CYMBALTA) 20 MG capsule Take 20 mg by mouth daily.    . fenofibrate 160 MG tablet Take 1 tablet by mouth once daily 90 tablet 0  . gabapentin (NEURONTIN) 300 MG capsule Take 300 mg by mouth at bedtime.    [name] glucose blood (ACCU-CHEK GUIDE) test strip Use to check blood sugars 2 times daily. Dx Code: E11.9 100 each 12  . isosorbide mononitrate (IMDUR) 60 MG 24  hr tablet TAKE 1 & 1/2 (ONE & ONE-HALF) TABLETS BY MOUTH ONCE DAILY (Patient taking differently: Take 60 mg by mouth daily. ) 135 tablet 2  . meloxicam (MOBIC) 15 MG tablet Take 1 tablet (15 mg total) by mouth daily. 30 tablet 1  . methenamine (MANDELAMINE) 1 g tablet Take 1,000 mg by mouth 3 (three) times daily.     . nitroGLYCERIN (NITROSTAT) 0.4 MG SL tablet Place 1 tablet (0.4 mg total) under the tongue every 5 (five) minutes as needed. For chest pain 25 tablet 3  . Oxycodone HCl 10 MG TABS Take 10 mg by mouth every 6 (six) hours as needed (pain).     [name] POTASSIUM CITRATE PO Take 20 mg by mouth daily.    [name] rOPINIRole (REQUIP) 0.25 MG tablet Take 1 tablet (0.25 mg total) by mouth at bedtime. TAKE 1 TABLET BY MOUTH 3 HOURS BEFORE BEDTIME 90 tablet 3  . rosuvastatin (CRESTOR) 40 MG tablet Take 1 tablet by mouth once daily 90 tablet 1  . senna-docusate (SENNA S) 8.6-50 MG tablet Take 1 tablet by mouth daily. 60 tablet 3  . sitaGLIPtin (JANUVIA) 50 MG tablet Take 1 tablet (50 mg total) by mouth daily. 90 tablet 0  . triamcinolone ointment (KENALOG) 0.5 % Apply 1 application topically 2 (two) times daily. 30 g 0  . zolpidem (AMBIEN) 5 MG tablet Take 5 mg by mouth at bedtime.      Facility-Administered Medications Ordered in Other Visits  Medication Dose Route Frequency Provider Last Rate Last Dose  . 0.9 %  sodium chloride infusion  10 mL/hr Intravenous Once Maudie Flakes, MD      . norepinephrine (LEVOPHED) 4mg  in 218mL premix infusion  0-40 mcg/min Intravenous Continuous Maudie Flakes, MD      . vancomycin (VANCOCIN) IVPB 1000 mg/200 mL premix  1,000 mg Intravenous Once Maudie Flakes, MD 200 mL/hr at 07/24/18 1013 1,000 mg at 07/24/18 1013    Allergies:   Patient has no known allergies.   Social History:  The patient  reports that he has never smoked. He has never used smokeless tobacco. He reports that he does not drink alcohol or use drugs.   Family History:  The patient's family  history includes Healthy in his brother and sister; Hyperlipidemia in his father and mother; Hypertension in his father and mother.   ROS:  Please see the history of present illness.     All other systems reviewed and negative.   PHYSICAL EXAM: VS:  There were no vitals taken for this visit.  GENERAL:  Well appearing WM in NAD HEENT:  PERRL, EOMI, sclera are clear. Oropharynx is clear. NECK:  No jugular venous distention, carotid upstroke brisk and symmetric, no bruits, no thyromegaly or adenopathy LUNGS:  Clear  to auscultation bilaterally CHEST:  Unremarkable HEART:  RRR,  PMI not displaced or sustained,S1 and S2 within normal limits, no S3, no S4: no clicks, no rubs, no murmurs ABD:  Soft, nontender. BS +, no masses or bruits. No hepatomegaly, no splenomegaly EXT:  2 + pulses throughout, no edema, no cyanosis no clubbing SKIN:  Warm and dry.  No rashes NEURO:  Alert and oriented x 3. Cranial nerves II through XII intact. PSYCH:  Cognitively intact      Laboratory data:  Lab Results  Component Value Date   WBC 13.0 (H) 07/24/2018   HGB 6.4 (LL) 07/24/2018   HCT 21.0 (L) 07/24/2018   PLT 360 07/24/2018   GLUCOSE 245 (H) 07/24/2018   CHOL 148 07/24/2017   TRIG 188 (H) 07/24/2017   HDL 33 (L) 07/24/2017   LDLDIRECT 45 03/01/2015   LDLCALC 77 07/24/2017   ALT 11 07/24/2018   AST 17 07/24/2018   NA 132 (L) 07/24/2018   K 4.0 07/24/2018   CL 99 07/24/2018   CREATININE 2.61 (H) 07/24/2018   BUN 33 (H) 07/24/2018   CO2 23 07/24/2018   TSH 2.404 06/05/2018   INR 1.6 (H) 07/24/2018   HGBA1C 5.8 (H) 03/22/2018     ASSESSMENT AND PLAN:  1. Orthostatic intolerance: this has resolved with reduction in medication. 2. CAD (coronary artery disease): s/p remote CABG in 2008. Occluded SVG to OM1 and OM2 in 2013.  Stable angina pectoris class 1. Continue aspirin, Plavix, nitrates, beta blocker.  3. HTN (hypertension):  This is well controlled.  4. Hyperlipidemia- combined with  severe hypertriglyceridemia. :   On pravastatin, fenofibrate, fish oil. Last lipid panel showed significant improvement in triglycerides to 262. Will update today 5. CKD (chronic kidney disease) stage 2, GFR 60-89 ml/min:   6. Renal calculi- recurrent 7. Bladder Cancer stage IV.   I will follow up in 6 months.

## 2018-07-24 NOTE — ED Notes (Addendum)
Pts systolic BP in the 84F. MD notified. Attempted to call wife.

## 2018-07-24 NOTE — ED Notes (Addendum)
Checked on pt and he was unresponsive and bradycardic pulses strong pressures remained in 60s on 3rd L of bolus fluids. Giving NS through pressure bag. Notified MD. Systolic now 833 after pressers given. IV team at bedside to access port a cath.

## 2018-07-24 NOTE — Consult Note (Signed)
Urology Consult Note   Requesting Attending Physician:  Kipp Brood, MD Service Providing Consult: Urology  Consulting Attending: Louis Meckel   Reason for Consult:  Bladder cancer in extrophy patient  HPI: Andrew Joyce is seen in consultation for at the request of Kipp Brood, MD for evaluation of fungating bladder mass and infection.    58 y.o. male with history of congenital bladder extrophy. Seen initially at [facility] on 07/01/17 with a RUS that showed right hydronephrosis and a 4.7 cm anterior bladder mass.  Underwent VIR bilateral PCN on 7/10.  Underwent TURBT on 7/17. 7 x 4cm well demarcated pedunculated mass, extremely thin bladder wall.  Pathology showed invasive Murray w/ prominent squamous features, focal glandular features, and myxoid stroma suggestive of sarcomatoid carcinoma.  Obtained staging PET/CT C/A/P to further evaluate nodal disease. Imaging 08/16/18 demonstrated enlarged right external iliac chain lymph node concerning for metastasis  He was seen at [facility] on 08/18/17.  Plan was for non cisplatin Chemotherapy and then follow up with [facility] after to discuss if surgery was even an options  He received 3 cycles of Gem/Carbo as well as palliative XRT here in [place]. Only current therapy is Pembro for progressing disease.  Gets his neph tubes exchanged frequently at [facility]. Right last changed 06/09/18. Left last exchanged 06/30/18   Of note he has a significant cardiac hx - CAD s/p CABG in 2008, NSTEMI 2013. No prior surgery. His cardiologist is Dr. Peter Martinique in [place]. Last saw in July, 2019   Past Medical History: Past Medical History:  Diagnosis Date  . 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 Cancer ([place])   . Chronic kidney disease (CKD), stage II (mild)   . Congenital birth defect   . Coronary artery disease    a. s/p CABG in 2008; b.  s/p cath in 2011 for med rx.;  c.  NSTEMI (02/2011):  LHC (02/2011):  LAD occluded,  distal LAD filled via left to left collaterals, distal CFX 40-50%, proximal RCA occluded (right to right collaterals), distal RCA occluded, S-RCA occluded proximally, S-OM1/OM2 occluded (new from 2011 - culprit), L-LAD ok, ant apical HK, EF 45-50% - med Rx.  [name] Dyslipidemia   . History of renal stone   . Hx of cardiovascular stress test    Nuclear (06/2009):  EF 71%, small area of infarct/ischemia in inferior wall-unchanged from prior  . Hx of echocardiogram    Echo (05/2013):  EF 55-60%, inf HK, mild AI, normal RVSF, RVSP 28 mmHg  . Hypertension   . Pancreatitis   . Recurrent UTI     Past Surgical History:  Past Surgical History:  Procedure Laterality Date  . CARDIAC CATHETERIZATION  07/28/2009   EF 60%; Grafts patent except for SVG to the AM and PD. Native RCA is occluded.  [name] CARDIAC CATHETERIZATION  03/20/2006   EF 60-65%  . CARDIOVASCULAR STRESS TEST  06/12/2009   EF 71%, SMALL AREA OF INFARCT/ISCHEMIA IN INFERIOR WALL; FELT TO NOT BE CHANGED.  [name] CORONARY ARTERY BYPASS GRAFT  03/2006   LIMA GRAFT TO LAD, SAPHENOUS VEIN GRAFT TO THE ACUTE MARGINAL BRANCH THE RIGHT CORONARY, SAPHENOUS VEIN GRAFT TO THE PDA, SEQUENTIAL VEIN GRAFT TO THE FIRST AND SECOND OBTUSE MARGINAL VESSELS  . IR IMAGING GUIDED PORT INSERTION  09/19/2017  . IR NEPHROSTOMY EXCHANGE LEFT  03/22/2018  . IR NEPHROSTOMY EXCHANGE LEFT  05/08/2018  . IR NEPHROSTOMY EXCHANGE LEFT  05/21/2018  . IR NEPHROSTOMY  EXCHANGE LEFT  06/09/2018  . IR NEPHROSTOMY EXCHANGE LEFT  06/30/2018  . IR NEPHROSTOMY EXCHANGE RIGHT  03/22/2018  . IR NEPHROSTOMY EXCHANGE RIGHT  05/21/2018  . IR NEPHROSTOMY EXCHANGE RIGHT  06/09/2018  . IR NEPHROSTOMY TUBE CHANGE  04/22/2018  . KIDNEY SURGERY    . LEFT HEART CATHETERIZATION WITH CORONARY ANGIOGRAM N/A 02/26/2011   Procedure: LEFT HEART CATHETERIZATION WITH CORONARY ANGIOGRAM;  Surgeon: Thayer Headings, MD;  Location: [facility] CATH LAB;  Service: Cardiovascular;  Laterality: N/A;    Medication: Current  Facility-Administered Medications  Medication Dose Route Frequency Provider Last Rate Last Dose  . acetaminophen (TYLENOL) tablet 650 mg  650 mg Oral Q4H PRN Bowser, Laurel Dimmer, NP      . baclofen (LIORESAL) tablet 20 mg  20 mg Oral TID Cristal Generous, NP      . ceFEPIme (MAXIPIME) 2 g in sodium chloride 0.9 % 100 mL IVPB  2 g Intravenous Q24H King, Allie, Student-PharmD      . insulin aspart (novoLOG) injection 0-9 Units  0-9 Units Subcutaneous Q4H Cristal Generous, NP   3 Units at 07/24/18 1419  . lactated ringers infusion   Intravenous Continuous Cristal Generous, NP 50 mL/hr at 07/24/18 1423    . norepinephrine (LEVOPHED) 4mg  in 257mL premix infusion  0-40 mcg/min Intravenous Continuous Maudie Flakes, MD 75 mL/hr at 07/24/18 1624 20 mcg/min at 07/24/18 1624  . ondansetron (ZOFRAN) injection 4 mg  4 mg Intravenous Q6H PRN Bowser, Laurel Dimmer, NP      . pantoprazole (PROTONIX) injection 40 mg  40 mg Intravenous QHS Bowser, Grace E, NP      . rOPINIRole (REQUIP) tablet 0.25 mg  0.25 mg Oral QHS Bowser, Grace E, NP      . sodium chloride flush (NS) 0.9 % injection 10-40 mL  10-40 mL Intracatheter Q12H Shelly Coss, MD   10 mL at 07/24/18 1617  . sodium chloride flush (NS) 0.9 % injection 10-40 mL  10-40 mL Intracatheter PRN Shelly Coss, MD      . Derrill Memo ON 07/26/2018] vancomycin (VANCOCIN) 1,250 mg in sodium chloride 0.9 % 250 mL IVPB  1,250 mg Intravenous Q48H King, Allie, Student-PharmD        Allergies: No Known Allergies  Social History: Social History   Tobacco Use  . Smoking status: Never Smoker  . Smokeless tobacco: Never Used  Substance Use Topics  . Alcohol use: No    Alcohol/week: 0.0 standard drinks  . Drug use: No    Family History Family History  Problem Relation Age of Onset  . Hyperlipidemia Mother        Living, 71  . Hypertension Mother   . Hyperlipidemia Father        Living 19  . Hypertension Father   . Healthy Sister   . Healthy Brother     Review of  Systems 10 systems were reviewed and are negative except as noted specifically in the HPI.  Objective   Vital signs in last 24 hours: BP (!) 108/93   Pulse 64   Temp 98.3 F (36.8 C) (Oral)   Resp 17   Ht 5\' 10"  (1.778 m)   Wt 61.1 kg   SpO2 91%   BMI 19.34 kg/m   Physical Exam General: NAD, A&O, resting, appropriate HEENT: [place]/AT, EOMI, MMM Pulmonary: Normal work of breathing Cardiovascular: HDS, adequate peripheral perfusion Abdomen: Abdomen non tender, non distented GU: Fungating bladder mass extending through anterior abdominal wall. Significant  dead tissue indicative of poor wound care. Mass itself bleeds with bedside debridement. Erythema surrounding mass and extending to tender scrotum     Most Recent Labs: Lab Results  Component Value Date   WBC 13.0 (H) 07/24/2018   HGB 6.4 (LL) 07/24/2018   HCT 21.0 (L) 07/24/2018   PLT 360 07/24/2018    Lab Results  Component Value Date   NA 132 (L) 07/24/2018   K 4.0 07/24/2018   CL 99 07/24/2018   CO2 23 07/24/2018   BUN 33 (H) 07/24/2018   CREATININE 2.61 (H) 07/24/2018   CALCIUM 8.9 07/24/2018   MG 2.0 07/24/2018   PHOS 2.6 07/24/2018    Lab Results  Component Value Date   INR 1.6 (H) 07/24/2018   APTT 29 07/24/2018     IMAGING: Ct Abdomen Pelvis Wo Contrast  Result Date: 07/24/2018 CLINICAL DATA:  Sepsis.  Question intra-abdominal abscess. EXAM: CT ABDOMEN AND PELVIS WITHOUT CONTRAST TECHNIQUE: Multidetector CT imaging of the abdomen and pelvis was performed following the standard protocol without IV contrast. COMPARISON:  CT abdomen and pelvis 07/17/2018 and 03/21/2018. FINDINGS: Lower chest: Calcific aortic and coronary atherosclerosis. Heart size normal. Mild dependent atelectasis. Hepatobiliary: The liver appears normal. The gallbladder is decompressed. Walls of the gallbladder are thickened, new since the prior exam. Biliary tree unremarkable. Pancreas: Unremarkable. No pancreatic ductal dilatation or  surrounding inflammatory changes. Spleen: Normal in size without focal abnormality. Adrenals/Urinary Tract: Bilateral nephrostomy tubes are in place. Gas in the renal collecting systems related to the tubes noted. Right renal atrophy is seen. Cyst off the upper pole of the right kidney is unchanged. No hydronephrosis. Bladder exstrophy is again seen. The patient's bladder mass is difficult to discretely visualize on this noncontrast examination but the appearance is unchanged since the most recent exam. Locules of gas are again seen. The adrenal glands appear normal. Stomach/Bowel: Stomach is within normal limits. Appendix appears normal. No evidence of bowel wall thickening, distention, or inflammatory changes. Vascular/Lymphatic: 1.8 cm left inguinal lymph node on image 90 of series 3 is unchanged. Right inguinal lymph node measuring 1.8 cm on image 686 series 3 is also unchanged. 2.1 cm right external iliac lymph node on image 82 is unchanged based on remeasurement. Reproductive: Unremarkable. Other: None. Musculoskeletal: No focal lesion. IMPRESSION: No abscess is identified. The gallbladder is decompressed with wall thickening which is new since the most recent CT scan. Right upper quadrant ultrasound could be used for further evaluation. Bladder exstrophy with a large bladder mass as seen on the prior examination. Locules of gas in the bladder are chronic. No change in lymphadenopathy since the most recent exam likely due to metastatic disease. Electronically Signed   By: Inge Rise M.D.   On: 07/24/2018 12:33   Ct Head Wo Contrast  Result Date: 07/24/2018 CLINICAL DATA:  Fall.  History of bladder cancer. EXAM: CT HEAD WITHOUT CONTRAST CT CERVICAL SPINE WITHOUT CONTRAST TECHNIQUE: Multidetector CT imaging of the head and cervical spine was performed following the standard protocol without intravenous contrast. Multiplanar CT image reconstructions of the cervical spine were also generated. COMPARISON:   None. FINDINGS: CT HEAD FINDINGS Brain: No evidence of acute infarction, hemorrhage, hydrocephalus, extra-axial collection or mass lesion/mass effect. Vascular: No hyperdense vessel or unexpected calcification. Skull: Normal. Negative for fracture or focal lesion. Sinuses/Orbits: No acute finding. Other: None. CT CERVICAL SPINE FINDINGS Alignment: No traumatic malalignment. Skull base and vertebrae: No acute fracture. No primary bone lesion or focal pathologic process. Congenital C5-C6  fusion. Soft tissues and spinal canal: No prevertebral fluid or swelling. No visible canal hematoma. Disc levels: Moderate disc height loss with posterior disc osteophyte complex and right greater than left uncovertebral hypertrophy at C3-C4. Mild-to-moderate spinal canal stenosis. Severe right neuroforaminal stenosis. Mild disc height loss with left paracentral and medial foraminal disc protrusion at C6-C7. Moderate left uncovertebral hypertrophy. Moderate left neuroforaminal stenosis. Upper chest: New mild interlobular septal thickening in the lung apices. Other: Scattered small foci of intravascular air in the neck. IMPRESSION: 1.  No acute intracranial abnormality. 2.  No acute cervical spine fracture. 3. C3-C4 and C6-C7 spondylosis as described above. 4. New mild interlobular septal thickening in the lung apices, consistent with interstitial pulmonary edema. Electronically Signed   By: Titus Dubin M.D.   On: 07/24/2018 12:28   Ct Cervical Spine Wo Contrast  Result Date: 07/24/2018 CLINICAL DATA:  Fall.  History of bladder cancer. EXAM: CT HEAD WITHOUT CONTRAST CT CERVICAL SPINE WITHOUT CONTRAST TECHNIQUE: Multidetector CT imaging of the head and cervical spine was performed following the standard protocol without intravenous contrast. Multiplanar CT image reconstructions of the cervical spine were also generated. COMPARISON:  None. FINDINGS: CT HEAD FINDINGS Brain: No evidence of acute infarction, hemorrhage,  hydrocephalus, extra-axial collection or mass lesion/mass effect. Vascular: No hyperdense vessel or unexpected calcification. Skull: Normal. Negative for fracture or focal lesion. Sinuses/Orbits: No acute finding. Other: None. CT CERVICAL SPINE FINDINGS Alignment: No traumatic malalignment. Skull base and vertebrae: No acute fracture. No primary bone lesion or focal pathologic process. Congenital C5-C6 fusion. Soft tissues and spinal canal: No prevertebral fluid or swelling. No visible canal hematoma. Disc levels: Moderate disc height loss with posterior disc osteophyte complex and right greater than left uncovertebral hypertrophy at C3-C4. Mild-to-moderate spinal canal stenosis. Severe right neuroforaminal stenosis. Mild disc height loss with left paracentral and medial foraminal disc protrusion at C6-C7. Moderate left uncovertebral hypertrophy. Moderate left neuroforaminal stenosis. Upper chest: New mild interlobular septal thickening in the lung apices. Other: Scattered small foci of intravascular air in the neck. IMPRESSION: 1.  No acute intracranial abnormality. 2.  No acute cervical spine fracture. 3. C3-C4 and C6-C7 spondylosis as described above. 4. New mild interlobular septal thickening in the lung apices, consistent with interstitial pulmonary edema. Electronically Signed   By: Titus Dubin M.D.   On: 07/24/2018 12:28   Dg Chest Port 1 View  Result Date: 07/24/2018 CLINICAL DATA:  Coming from home. Wife called EMS for a fall in the bathroom pt could not get up unassisted. Bladder cancer patient, due for chemo today. EXAM: PORTABLE CHEST 1 VIEW COMPARISON:  07/17/2018, chest CT. FINDINGS: Stable changes from prior cardiac surgery. Cardiac silhouette is normal in size and configuration. No mediastinal or hilar masses or evidence of adenopathy. Clear lungs.  No pleural effusion or pneumothorax. Skeletal structures are grossly intact. Stable right anterior chest wall Port-A-Cath, tip at the caval  atrial junction. IMPRESSION: No acute cardiopulmonary disease. Electronically Signed   By: Lajean Manes M.D.   On: 07/24/2018 10:30    ------  Assessment:  59 y.o. male with bladder extrophy and now bladder cancer that is progressive despite chemotherapy, radiation, salvage chemotherapy.  His urine is diverted with bilateral nephrostomy tubes.  Admitted for infection/sepsis.  Possible sources include urine, fungating bladder mass, epididymoorchitis.  Debriding his mass would be a morbid procedure and may cause further injury.  Any debridement would not likely heal on its own as it would be an incomplete resection.  Overall  recommend local wound care and antibiotics   Recommendations: - Agree with empiric antibiotics to cover urinary source, cellulitis, epididymitis   -After empiric antibiotics would need course of oral antibiotics to treat epididymoorchitis  -Follow-up urine culture.  Unclear where urine was taken previously but ideal culture would be from bilateral nephrostomy tubes prior to antibiotics  -Agree with wound care consult.  Our recommendations would be every 8 hour saline washes with wet to dry dressings for partial wound debridement  -Continue bilateral nephrostomy tubes to drainage.  Unless fungal urine culture these tubes do not need to be exchanged for infection  Thank you for this consult.  We will continue to follow the patient and reexamine him on the morning of 7/18.    Daytime Hours: Please contact the Dr. Tharon Aquas or Dr. Louis Meckel with any further questions/concerns.    If after 5PM weekdays or anytime weekends: please contact urology consult pager

## 2018-07-24 NOTE — Progress Notes (Addendum)
Pharmacy Antibiotic Note  Andrew Joyce is a 58 y.o. male admitted on 07/24/2018 with sepsis.  Pharmacy has been consulted for vancomycin and cefepime dosing. Pt received one time dose of Rocephin 2g in ED this morning. SCr 2.61 (baseline somewhere around 1.5), Tmax 98.1 and WBC 13.  Plan: Cefepime 2g IV q24h Vancomycin 1g IV x1 already given in the ED this morning then vancomycin 1.25g IV q48h Monitor renal function, clinical progression F/u C&S  Goal AUC 400-550. Expected AUC: 546.7 SCr used: 2.61  Height: 5\' 10"  (177.8 cm) Weight: 134 lb 12.8 oz (61.1 kg) IBW/kg (Calculated) : 73  Temp (24hrs), Avg:98.7 F (37.1 C), Min:98.1 F (36.7 C), Max:100 F (37.8 C)  Recent Labs  Lab 07/24/18 0935  WBC 13.0*  CREATININE 2.61*  LATICACIDVEN 1.4    Estimated Creatinine Clearance: 26 mL/min (A) (by C-G formula based on SCr of 2.61 mg/dL (H)).    No Known Allergies  Antimicrobials this admission: Cefepime 7/17 >>  Vancomycin 7/17 >> Ceftriaxone x1 in ED 7/17 Metronidazole x1 7/17  Dose adjustments this admission: n/a  Microbiology results: Pending  Thank you for allowing pharmacy to be a part of this patient's care.  Natale Lay 07/24/2018 2:50 PM

## 2018-07-25 ENCOUNTER — Encounter (HOSPITAL_COMMUNITY): Payer: Self-pay | Admitting: Emergency Medicine

## 2018-07-25 ENCOUNTER — Other Ambulatory Visit: Payer: Self-pay

## 2018-07-25 DIAGNOSIS — N179 Acute kidney failure, unspecified: Secondary | ICD-10-CM

## 2018-07-25 DIAGNOSIS — N10 Acute tubulo-interstitial nephritis: Secondary | ICD-10-CM

## 2018-07-25 LAB — PHOSPHORUS: Phosphorus: 2.4 mg/dL — ABNORMAL LOW (ref 2.5–4.6)

## 2018-07-25 LAB — GLUCOSE, CAPILLARY
Glucose-Capillary: 114 mg/dL — ABNORMAL HIGH (ref 70–99)
Glucose-Capillary: 120 mg/dL — ABNORMAL HIGH (ref 70–99)
Glucose-Capillary: 133 mg/dL — ABNORMAL HIGH (ref 70–99)
Glucose-Capillary: 134 mg/dL — ABNORMAL HIGH (ref 70–99)
Glucose-Capillary: 146 mg/dL — ABNORMAL HIGH (ref 70–99)
Glucose-Capillary: 170 mg/dL — ABNORMAL HIGH (ref 70–99)

## 2018-07-25 LAB — TYPE AND SCREEN
ABO/RH(D): O POS
Antibody Screen: NEGATIVE
Unit division: 0

## 2018-07-25 LAB — BPAM RBC
Blood Product Expiration Date: 202008152359
ISSUE DATE / TIME: 202007171549
Unit Type and Rh: 5100

## 2018-07-25 LAB — CBC
HCT: 26.4 % — ABNORMAL LOW (ref 39.0–52.0)
Hemoglobin: 8.2 g/dL — ABNORMAL LOW (ref 13.0–17.0)
MCH: 29 pg (ref 26.0–34.0)
MCHC: 31.1 g/dL (ref 30.0–36.0)
MCV: 93.3 fL (ref 80.0–100.0)
Platelets: 367 10*3/uL (ref 150–400)
RBC: 2.83 MIL/uL — ABNORMAL LOW (ref 4.22–5.81)
RDW: 15.9 % — ABNORMAL HIGH (ref 11.5–15.5)
WBC: 19.8 10*3/uL — ABNORMAL HIGH (ref 4.0–10.5)
nRBC: 0 % (ref 0.0–0.2)

## 2018-07-25 LAB — BASIC METABOLIC PANEL
Anion gap: 9 (ref 5–15)
BUN: 18 mg/dL (ref 6–20)
CO2: 23 mmol/L (ref 22–32)
Calcium: 8.5 mg/dL — ABNORMAL LOW (ref 8.9–10.3)
Chloride: 107 mmol/L (ref 98–111)
Creatinine, Ser: 1.8 mg/dL — ABNORMAL HIGH (ref 0.61–1.24)
GFR calc Af Amer: 46 mL/min — ABNORMAL LOW (ref >60)
GFR calc non Af Amer: 40 mL/min — ABNORMAL LOW (ref >60)
Glucose, Bld: 149 mg/dL — ABNORMAL HIGH (ref 70–99)
Potassium: 4.1 mmol/L (ref 3.5–5.1)
Sodium: 139 mmol/L (ref 135–145)

## 2018-07-25 LAB — MAGNESIUM: Magnesium: 1.6 mg/dL — ABNORMAL LOW (ref 1.7–2.4)

## 2018-07-25 LAB — OCCULT BLOOD X 1 CARD TO LAB, STOOL: Fecal Occult Bld: NEGATIVE

## 2018-07-25 MED ORDER — PROMETHAZINE HCL 25 MG PO TABS
12.5000 mg | ORAL_TABLET | Freq: Four times a day (QID) | ORAL | Status: DC | PRN
  Administered 2018-07-25 – 2018-07-27 (×3): 25 mg via ORAL
  Filled 2018-07-25 (×3): qty 1

## 2018-07-25 MED ORDER — BOOST / RESOURCE BREEZE PO LIQD CUSTOM
1.0000 | Freq: Three times a day (TID) | ORAL | Status: DC
  Administered 2018-07-25 – 2018-07-27 (×5): 1 via ORAL

## 2018-07-25 MED ORDER — SODIUM CHLORIDE 0.9 % IV SOLN
2.0000 g | Freq: Two times a day (BID) | INTRAVENOUS | Status: DC
  Administered 2018-07-25 – 2018-07-26 (×3): 2 g via INTRAVENOUS
  Filled 2018-07-25 (×3): qty 2

## 2018-07-25 MED ORDER — MAGNESIUM SULFATE 2 GM/50ML IV SOLN
2.0000 g | Freq: Once | INTRAVENOUS | Status: AC
  Administered 2018-07-25: 2 g via INTRAVENOUS
  Filled 2018-07-25: qty 50

## 2018-07-25 MED ORDER — VANCOMYCIN HCL IN DEXTROSE 750-5 MG/150ML-% IV SOLN
750.0000 mg | INTRAVENOUS | Status: DC
  Administered 2018-07-25 – 2018-07-26 (×2): 750 mg via INTRAVENOUS
  Filled 2018-07-25 (×3): qty 150

## 2018-07-25 NOTE — Progress Notes (Signed)
Pharmacy Antibiotic Note  Andrew Joyce is a 58 y.o. male admitted on 07/24/2018 with sepsis.  Pharmacy has been consulted for vancomycin and cefepime dosing. Pt received one time dose of Rocephin 2g in ED this morning. SCr 2.61 (baseline somewhere around 1.5), Tmax 98.1 and WBC 13. Scr has improved to 1.8. Recalculated vancomycin dosing with improved renal function and increased frequency of cefepime dosing.  Plan: Cefepime 2g IV q12h Vancomycin 1g IV x1 already given in the ED this morning then vancomycin 750 q24h Monitor renal function, clinical progression F/u C&S  Goal AUC 400-550. Expected AUC: 500 SCr used: 1.80  Height: 5\' 10"  (177.8 cm) Weight: 131 lb 9.8 oz (59.7 kg) IBW/kg (Calculated) : 73  Temp (24hrs), Avg:99.9 F (37.7 C), Min:98.1 F (36.7 C), Max:102.9 F (39.4 C)  Recent Labs  Lab 07/24/18 0935 07/24/18 1526 07/24/18 2058 07/25/18 0442  WBC 13.0*  --  18.9* 19.8*  CREATININE 2.61*  --   --  1.80*  LATICACIDVEN 1.4 2.1*  --   --     Estimated Creatinine Clearance: 36.9 mL/min (A) (by C-G formula based on SCr of 1.8 mg/dL (H)).    No Known Allergies  Antimicrobials this admission: Cefepime 7/17 >>  Vancomycin 7/17 >> Ceftriaxone x1 in ED 7/17 Metronidazole x1 7/17  Dose adjustments this admission: n/a  Microbiology results: Pending  Thank you for allowing pharmacy to be a part of this patient's care.  Phillis Haggis 07/25/2018 9:44 AM

## 2018-07-25 NOTE — Progress Notes (Signed)
Brief note :  Wife called and updated on condition , questions answered  Nihal Doan NP-C  [facility]  [phone]  07/25/2018

## 2018-07-25 NOTE — Progress Notes (Signed)
NAME:  Elchanan Bob, MRN:  [id number], DOB:  [birth year], LOS: 1 ADMISSION DATE:  07/24/2018, CONSULTATION DATE:  07/24/2018 REFERRING MD: Sedonia Small  CHIEF COMPLAINT:  Fevers, fall, Hypotension   Brief History   Treyshawn Muldrew is a 58 y.o. year-old male with a history of CKD, CAD, Stage IV bladder cancer diagnosed in 2019 with pelvic lymph node involvement  with biopsy proven to be high-grade urothelial carcinoma with sarcomatoid features. He  presented to the ED with chief complaint of fall.He fell in the bathroom x 2 today, endorsing head trauma, no loss consciousness.  Patient has fever that he states has been ongoing x 2 weeks, up to 102. Per EMS he has a fissure to his right groin that had become infected. He endorses poor appetite x 1 week. He was due for chemo today. In the ED he was hypotensive , HBG was 6.4. He was started on pressors for BP and CCM were called to admit to ICU and manage care.   History of present illness   Townes Fuhs is a 58 y.o. year-old male with a history of CKD, CAD, DM Stage IV bladder cancer diagnosed in 2019 with pelvic lymph node involvement  with biopsy proven to be high-grade urothelial carcinoma with sarcomatoid features. He  presented to the ED with chief complaint of fall.He fell in the bathroom x 2 today, endorsing head trauma, no loss consciousness.  Patient has fever that he states has been ongoing x 2 weeks, up to 102. Per EMS he has a fissure to his right groin that had become infected. He endorses poor appetite x 1 week. He was due for chemo today.Last chemo was 06/26/2018. Regimen was [id number]: Pembrolizumab 200 mg . This regimen was discontinued 7/13 due to disease progression. He was to have his first dose cycle of Enfortumab vedotin-ejfv 7/17 ( today). This is a non-curative therapy with palliative intent.   In the ED he was hypotensive, received  3 L of NS. ,HBG was 6.4. ( 6/19 patient received 1 unit of blood for HGB 6.9) .1 unit of  blood has been ordered. INR is 1.6.He denies any blood in his stools. He was started on Levophed  for BP . WBC was 13,000,  Lactate was 1.4 , fever 100 ( received tylenol per EMS)  He endorses poor appetite x 1 week. Albumin is 1.9. Blood and urine cultures have been ordered and are pending. Pt. does have bilateral nephrostomy tubes.  Imaging for brain was negative for any acute events. New wall thickening was noted in his gall bladder. Pt. Has been a full  code during his 2 previous hospitalizations. When asked today, his desire is to remain a full code at present.  PCCM will admit to ICU for further care and monitoring. If he declines, goals of care will need to be re-evaluated,.   Of note, patient was born with bladder exstrophy, and bladder was returned to abdomen  at age of 65 years.  Past Medical History   Past Medical History:  Diagnosis Date  . 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 Cancer ([place])   . Chronic kidney disease (CKD), stage II (mild)   . Congenital birth defect   . Coronary artery disease    a. s/p CABG in 2008; b.  s/p cath in 2011 for med rx.;  c.  NSTEMI (02/2011):  LHC (02/2011):  LAD occluded, distal LAD filled via left to left  collaterals, distal CFX 40-50%, proximal RCA occluded (right to right collaterals), distal RCA occluded, S-RCA occluded proximally, S-OM1/OM2 occluded (new from 2011 - culprit), L-LAD ok, ant apical HK, EF 45-50% - med Rx.  [name] Dyslipidemia   . History of renal stone   . Hx of cardiovascular stress test    Nuclear (06/2009):  EF 71%, small area of infarct/ischemia in inferior wall-unchanged from prior  . Hx of echocardiogram    Echo (05/2013):  EF 55-60%, inf HK, mild AI, normal RVSF, RVSP 28 mmHg  . Hypertension   . Pancreatitis   . Recurrent UTI     Significant Hospital Events   06/26/2018>> Last Chemo 07/24/2018 Admission  Consults:  PCCM  Procedures:  07/24/2018: Port a Cath accessed  Significant Diagnostic  Tests:  07/24/2018  CT cervical Spine/ CT Head  No acute intracranial abnormality. No acute cervical spine fracture. C3-C4 and C6-C7 spondylosis as described above. New mild interlobular septal thickening in the lung apices, consistent with interstitial pulmonary edema.  07/24/2018 CT Abdomen and Pelvis without Contrast No abscess is identified. The gallbladder is decompressed with wall thickening which is new since the most recent CT scan. Right upper quadrant ultrasound could be used for further evaluation. Bladder exstrophy with a large bladder mass as seen on the prior examination. Locules of gas in the bladder are chronic. No change in lymphadenopathy since the most recent exam likely due to metastatic disease.  07/24/2018>> RUQ US>> pending   Micro Data:  7/17>> Blood 7/17>> Urine 7/17 Sars Co2 >>negative    Antimicrobials:  Rocephin 7/17>> Flagyl 7/17>> Vanc 7/17>>  Interim history/subjective:  Weaning pressors , with decreased demands this am .  Alert and follows commands , sleeping this am, awakes to voice  Nausea persists  Objective   Blood pressure 94/64, pulse (!) 55, temperature 98.2 F (36.8 C), temperature source Oral, resp. rate 16, height 5\' 10"  (1.778 m), weight 59.7 kg, SpO2 99 %.        Intake/Output Summary (Last 24 hours) at 07/25/2018 0821 Last data filed at 07/25/2018 0500 Gross per 24 hour  Intake 5382.94 ml  Output 3375 ml  Net 2007.94 ml   Filed Weights   07/24/18 1437 07/25/18 0451  Weight: 61.1 kg 59.7 kg    Examination: General: Thin, pale, male cachexic , in NAD on RA HENT: NCAT, No LAD, Port a cath right chest wall, no redness or drainage noted  Lungs: Bilateral chest excursion, Clear to auscultation Cardiovascular: S1, S2, RRR, No RMG noted Abdomen: Soft , flat , ND, NT, BS diminished Extremities: No obvious deformities, MAE x 4 weakly Neuro: Awake and alert,MAE x 4, A&O x 3, appropriate GU: Bilateral nephrostomy tubes   with turbid urine in adequate amounts. Suprapubic bladder to abdominal wall fistulla /mass with yellow drainage and foul odor  Resolved Hospital Problem list     Assessment & Plan:  Suspected Septic Shock -immunocompromised host  -Suspected sources are UTI, fungating bladder mass, epididymoorchitis  - born with bladder exstrophy, and bladder was returned to abdomen  at age of 1 years. -BCx, UCx -pending  P Follow up culture data Consider sending culture from port if unable to identify sourse  Cont Broad spectrum abx  Trend WBC, temp curve  MAP goal > 65, titrated NE for goal  Tylenol for fever  AKI on CKD -likely multifactorial etiology in setting of Poor PO intake x 1-2 weeks, acute hypoperfusion requiring pressors in ED, suspected sepsis  -scr improving  Hypomag  P LR 50/hr  Trend BMP Strict I/O Avoid nephrotoxic medications Maintain renal perfulsion  Replace Mg   Stage IV bladder cancer, diagnosed 2019, with worsening pelvic lymphadenopathy on CT. -high grade urothelial carcinoma with sarcomatoid features -is s/p definitive therapy (s/p TURBT 2019, s/p 3x chemo 2019, s/p radiation feb 2020, now doing palliative chemo)  -Followed by Dr. Thurston Pounds -consult oncology if needed.   Gallbladder wall thickening  P Consider Abd [place] going forward   Anemia -acute anemia (?blood loss) vs chronic -hbg improved/stable  Coagulopathy, mild  -INR 1.6 (previous in 03/2018 WNL) P Heme occult    Holding chemical VTE ppx at this time.  Check INR in am   DM2 P Sensitive SSI  CBG Q 4  Hyponatremia P LR  Trend BMP  Muscle Twitching seen by Posey Pronto, neuro, who indicated pt to stop carbamazepine, consider paraneoplastic panel. Possible SE of pembrolizumab Cramp-fasciculation syndrome  RLS P Cont home baclofen  Ropinitole 25mg  qHS    Nausea P Zofran prn  Protein Calorie Malnutrition   P Advance Diet as tolerated   Best practice:  Diet:Clear    Pain/Anxiety/Delirium protocol (if indicated): NA VAP protocol (if indicated): NA DVT prophylaxis: PAS for now  GI prophylaxis: Protonix Glucose control: SSI, CBG Q 4 Mobility:  BR for now Code Status: Full Family Communication: Pending Per MD Disposition: ICU  Labs   CBC: Recent Labs  Lab 07/24/18 0935 07/24/18 2058 07/25/18 0442  WBC 13.0* 18.9* 19.8*  NEUTROABS 11.5*  --   --   HGB 6.4* 7.2* 8.2*  HCT 21.0* 22.9* 26.4*  MCV 93.8 90.2 93.3  PLT 360 369 007    Basic Metabolic Panel: Recent Labs  Lab 07/24/18 0935 07/25/18 0442  NA 132* 139  K 4.0 4.1  CL 99 107  CO2 23 23  GLUCOSE 245* 149*  BUN 33* 18  CREATININE 2.61* 1.80*  CALCIUM 8.9 8.5*  MG 2.0 1.6*  PHOS 2.6 2.4*   GFR: Estimated Creatinine Clearance: 36.9 mL/min (A) (by C-G formula based on SCr of 1.8 mg/dL (H)). Recent Labs  Lab 07/24/18 0935 07/24/18 1526 07/24/18 2058 07/25/18 0442  PROCALCITON 3.57  --   --   --   WBC 13.0*  --  18.9* 19.8*  LATICACIDVEN 1.4 2.1*  --   --     Liver Function Tests: Recent Labs  Lab 07/24/18 0935  AST 17  17  ALT 12  11  ALKPHOS 77  79  BILITOT 0.4  0.4  PROT 6.3*  6.3*  ALBUMIN 1.8*  1.9*   No results for input(s): LIPASE, AMYLASE in the last 168 hours. No results for input(s): AMMONIA in the last 168 hours.  ABG No results found for: PHART, PCO2ART, PO2ART, HCO3, TCO2, ACIDBASEDEF, O2SAT   Coagulation Profile: Recent Labs  Lab 07/24/18 0935  INR 1.6*    Cardiac Enzymes: No results for input(s): CKTOTAL, CKMB, CKMBINDEX, TROPONINI in the last 168 hours.  HbA1C: Hgb A1c MFr Bld  Date/Time Value Ref Range Status  03/22/2018 05:27 AM 5.8 (H) 4.8 - 5.6 % Final    Comment:    (NOTE) Pre diabetes:          5.7%-6.4% Diabetes:              >6.4% Glycemic control for   <7.0% adults with diabetes   07/24/2017 09:42 AM 7.7 (H) 4.8 - 5.6 % Final    Comment:  Prediabetes: 5.7 - 6.4          Diabetes: >6.4           Glycemic control for adults with diabetes: <7.0     CBG: Recent Labs  Lab 07/24/18 1409 07/24/18 1639 07/24/18 2003 07/24/18 2356 07/25/18 0418  GLUCAP 237* 138* 224* 114* 133*       Critical care time:    Mariela Rex NP-C  [facility]  [phone]  07/25/2018   07/25/2018 8:21 AM

## 2018-07-25 NOTE — Consult Note (Signed)
Urology Consult Note   Reason for Consult:  Bladder cancer in extrophy patient  Initial HPI 7/17: Andrew Joyce is seen in consultation for at the request of Kipp Brood, MD for evaluation of fungating bladder mass and infection.   58 y.o. male with history of congenital bladder extrophy. Seen initially at [facility] on 07/01/17 with a RUS that showed right hydronephrosis and a 4.7 cm anterior bladder mass.  Underwent VIR bilateral PCN on 7/10.  Underwent TURBT on 7/17. 7 x 4cm well demarcated pedunculated mass, extremely thin bladder wall.  Pathology showed invasive [place] w/ prominent squamous features, focal glandular features, and myxoid stroma suggestive of sarcomatoid carcinoma.  Obtained staging PET/CT C/A/P to further evaluate nodal disease. Imaging 08/16/18 demonstrated enlarged right external iliac chain lymph node concerning for metastasis  He was seen at [facility] on 08/18/17.  Plan was for non cisplatin Chemotherapy and then follow up with [facility] after to discuss if surgery was even an options  He received 3 cycles of Gem/Carbo as well as palliative XRT here in [place]. Only current therapy is Pembro for progressing disease.  Gets his neph tubes exchanged frequently [facility]. Right last changed 06/09/18. Left last exchanged 06/30/18  Of note he has a significant cardiac hx - CAD s/p CABG in 2008, NSTEMI 2013. No prior surgery. His cardiologist is Dr. Peter Martinique in [place]. Last saw in July, 2019   Interval 07/25/18:  Pressor requirement improving, norepinephrine at 6 from 12.  Febrile to 39.4 last night but remains on broad-spectrum vancomycin, cefepime, Flagyl.  Local wound care being performed. Nephrostomy tubes to drainage.  Urine culture pending   Objective   Vital signs in last 24 hours: BP 94/64   Pulse (!) 55   Temp 98.2 F (36.8 C) (Oral)   Resp 16   Ht 5\' 10"  (1.778 m)   Wt 59.7 kg   SpO2 99%   BMI 18.88 kg/m   EXAM:  GU 7/18: Fungating bladder mass  extending through anterior abdominal wall.  Clean dressing on this morning.  Erythema surrounding mass and extending to tender scrotum slightly improved (not extending)  ------  Assessment:  58 y.o. male with bladder extrophy and now bladder cancer that is progressive despite chemotherapy, radiation, salvage chemotherapy.  His urine is diverted with bilateral nephrostomy tubes.  Admitted for infection/sepsis.  Possible sources include urine, fungating bladder mass, epididymoorchitis.  Debriding his mass would be a morbid procedure and may cause further injury.  Any debridement would not likely heal on its own as it would be an incomplete resection.  Overall recommend local wound care and antibiotics  Recommendations: - Agree with empiric antibiotics to cover urinary source, cellulitis, epididymitis  -After empiric antibiotics would need course of oral antibiotics to treat epididymoorchitis  -Follow-up urine culture.  Unclear where urine was taken previously but ideal culture would be from bilateral nephrostomy tubes prior to antibiotics  -Agree with wound care consult.  Our recommendations would be every 8 hour saline washes with wet to dry dressings for partial wound debridement  -Continue bilateral nephrostomy tubes to drainage.  Unless fungal urine culture these tubes do not need to be exchanged for infection.-Flush each nephrostomy tube with 5 cc twice daily  -Please keep the scrotum elevated with a towel underneath the scrotum to assist with drainage of edema  Thank you for this consult.  We will continue to follow the patient and reexamine him on the morning of 7/18.    Daytime Hours: Please  contact the Dr. Tharon Aquas or Dr. Louis Meckel with any further questions/concerns.    If after 5PM weekdays or anytime weekends: please contact urology consult pager

## 2018-07-26 ENCOUNTER — Other Ambulatory Visit: Payer: Self-pay

## 2018-07-26 DIAGNOSIS — I10 Essential (primary) hypertension: Secondary | ICD-10-CM

## 2018-07-26 DIAGNOSIS — C679 Malignant neoplasm of bladder, unspecified: Secondary | ICD-10-CM

## 2018-07-26 DIAGNOSIS — E782 Mixed hyperlipidemia: Secondary | ICD-10-CM

## 2018-07-26 DIAGNOSIS — I25119 Atherosclerotic heart disease of native coronary artery with unspecified angina pectoris: Secondary | ICD-10-CM

## 2018-07-26 LAB — CBC
HCT: 22.8 % — ABNORMAL LOW (ref 39.0–52.0)
Hemoglobin: 7.3 g/dL — ABNORMAL LOW (ref 13.0–17.0)
MCH: 29.1 pg (ref 26.0–34.0)
MCHC: 32 g/dL (ref 30.0–36.0)
MCV: 90.8 fL (ref 80.0–100.0)
Platelets: 358 10*3/uL (ref 150–400)
RBC: 2.51 MIL/uL — ABNORMAL LOW (ref 4.22–5.81)
RDW: 15.9 % — ABNORMAL HIGH (ref 11.5–15.5)
WBC: 17.8 10*3/uL — ABNORMAL HIGH (ref 4.0–10.5)
nRBC: 0 % (ref 0.0–0.2)

## 2018-07-26 LAB — BASIC METABOLIC PANEL
Anion gap: 9 (ref 5–15)
BUN: 18 mg/dL (ref 6–20)
CO2: 23 mmol/L (ref 22–32)
Calcium: 8.4 mg/dL — ABNORMAL LOW (ref 8.9–10.3)
Chloride: 105 mmol/L (ref 98–111)
Creatinine, Ser: 1.47 mg/dL — ABNORMAL HIGH (ref 0.61–1.24)
GFR calc Af Amer: 59 mL/min — ABNORMAL LOW (ref >60)
GFR calc non Af Amer: 51 mL/min — ABNORMAL LOW (ref >60)
Glucose, Bld: 129 mg/dL — ABNORMAL HIGH (ref 70–99)
Potassium: 4 mmol/L (ref 3.5–5.1)
Sodium: 137 mmol/L (ref 135–145)

## 2018-07-26 LAB — GLUCOSE, CAPILLARY
Glucose-Capillary: 106 mg/dL — ABNORMAL HIGH (ref 70–99)
Glucose-Capillary: 123 mg/dL — ABNORMAL HIGH (ref 70–99)
Glucose-Capillary: 135 mg/dL — ABNORMAL HIGH (ref 70–99)
Glucose-Capillary: 158 mg/dL — ABNORMAL HIGH (ref 70–99)
Glucose-Capillary: 160 mg/dL — ABNORMAL HIGH (ref 70–99)
Glucose-Capillary: 206 mg/dL — ABNORMAL HIGH (ref 70–99)
Glucose-Capillary: 210 mg/dL — ABNORMAL HIGH (ref 70–99)

## 2018-07-26 LAB — LACTIC ACID, PLASMA: Lactic Acid, Venous: 1.6 mmol/L (ref 0.5–1.9)

## 2018-07-26 LAB — MAGNESIUM: Magnesium: 2.2 mg/dL (ref 1.7–2.4)

## 2018-07-26 LAB — PHOSPHORUS: Phosphorus: 2.6 mg/dL (ref 2.5–4.6)

## 2018-07-26 MED ORDER — SODIUM CHLORIDE 0.9 % IV SOLN
1.0000 g | INTRAVENOUS | Status: DC
  Administered 2018-07-26 – 2018-07-27 (×2): 1 g via INTRAVENOUS
  Filled 2018-07-26: qty 10
  Filled 2018-07-26: qty 1
  Filled 2018-07-26: qty 10

## 2018-07-26 MED ORDER — DOXYCYCLINE HYCLATE 100 MG PO TABS
100.0000 mg | ORAL_TABLET | Freq: Two times a day (BID) | ORAL | Status: DC
  Administered 2018-07-26 – 2018-07-27 (×4): 100 mg via ORAL
  Filled 2018-07-26 (×4): qty 1

## 2018-07-26 NOTE — Progress Notes (Signed)
PROGRESS NOTE    Andrew Joyce  AVW:[id number] DOB: Jun 05, 1960 DOA: 07/24/2018 PCP: [name], NP    Brief Narrative:  58 year old male who presented with fevers and hypotension.  He does have significant past medical history for chronic kidney disease, coronary artery disease, stage IV bladder cancer, high-grade urothelial carcinoma with sarcomatoid features.  He reported 2 weeks of intermittent fevers, associated poor appetite, the day of admission he fell in the bathroom with positive head trauma but no loss of consciousness.  On his initial physical examination he was febrile, blood pressure 80/53, heart rate 87, respiratory rate 10, oxygen saturation 98%.  His lungs are clear to auscultation bilaterally, heart S1-S2 present and rhythmic, abdomen soft, no lower extremity edema.  He had bilateral nephrostomy tubes with turbid urine.  Suprapubic bladder abdominal fistula with yellow drainage and foul odor.  Sodium 132, potassium 4.0, chloride 99, bicarb 23, glucose 245, BUN 33, creatinine 2.61, white count 13.0, hemoglobin 6.4, hematocrit 21.0, platelets 360.  SARS COVID-19 was negative.  Urinalysis more than 50 white cells.  Head and neck CT no acute changes.  CT of the abdomen and pelvis with bladder extrophy with a large bladder mass, unchanged.  Chest radiograph with no infiltrates.  EKG 88 bpm, normal axis, normal intervals, sinus rhythm, no ST segment or T wave changes.  Patient was admitted to the hospital working diagnosis of septic shock due to complicated urine tract infection.    Assessment & Plan:   Principal Problem:   Septic shock ([place]) Active Problems:   CAD (coronary artery disease)   HTN (hypertension)   Hyperlipidemia   Diabetes mellitus ([place])   Malignant neoplasm of urinary bladder (H[facility])   Type 2 diabetes mellitus ([place])   1. Septic shock due to complicated urinary tract infection/ in the setting of nephrostomy tubes and cutaneous bladder fistula/  epididymoorchitis. Patient off vasopressors since 07/25/18 at 11:58. Blood pressure has been stable with systolic 537 to 482 mmHg. WBC 17,8, down from 19,8. Cultures have been no growth, old records personally reviewed, urine cultures from this year positive for Providencia, Alcaligenes and Klebsiella sensitive to cephalosporins. Will continue antibiotic therapy with IV ceftriaxone plus doxycycline, will dc cefepime and vancomycin. Continue to follow cell count, temperature curve and cultures. Dc IV fluids, follow a restrictive IV fluids strategy.   2. AKI on CKD stage 3b. Renal function with serum cr at 1,47 with K at 4,0 with serum bicarbonate at 23. Baseline cr at 1,5 to 2,0. Patient is tolerating po well, no nausea or vomiting, will dc IV fluids, and follow renal panel in am.  3. T2DM. Fasting glucose this am at 129 mg/dl, will continue glucose cover and monitoring with insulin sliding scale.   4. HTN. Continue to hold on antihypertensive medications.   5. Bladder cancer with obstructive uropathy.  Urine output 3,075 ml over last 24 H, renal function improving per serum cr. Will continue local care to bladder fistula. Follow with urology recommendations.   6. Symptomatic anemia/ malignancy related. Improved Hgb and Hct after one unit of PRBC transfusion, will continue to follow on cell count, no signs of active bleeding.   DVT prophylaxis: scd   Code Status: full Family Communication: no family at the bedside  Disposition Plan/ discharge barriers: transfer to medical ward.   Body mass index is 19.45 kg/m. Malnutrition Type:      Malnutrition Characteristics:      Nutrition Interventions:     RN Pressure Injury Documentation:  Consultants:     Procedures:     Antimicrobials:   Vancomycin and cefepime 07/17 to 07/19  Started ceftriaxone 07/19    Subjective: Patient is feeling well, no nausea or vomiting, mild to moderated abdominal pain, with no radiation, no  improving or worsening factors. No dyspnea or chest pain.   Objective: Vitals:   07/26/18 0600 07/26/18 0700 07/26/18 0800 07/26/18 0805  BP: (!) 123/53 123/67 134/68   Pulse: 81 74 84   Resp: (!) 24 16 (!) 25   Temp:    99.3 F (37.4 C)  TempSrc:    Core  SpO2: 99% 97% 99%   Weight:      Height:        Intake/Output Summary (Last 24 hours) at 07/26/2018 1119 Last data filed at 07/26/2018 0800 Gross per 24 hour  Intake 2396.45 ml  Output 3075 ml  Net -678.55 ml   Filed Weights   07/24/18 1437 07/25/18 0451 07/26/18 0431  Weight: 61.1 kg 59.7 kg 61.5 kg    Examination:   General: deconditioned and ill looking appearing  Neurology: Awake and alert, non focal  E ENT: positive pallor, no icterus, oral mucosa moist Cardiovascular: No JVD. S1-S2 present, rhythmic, no gallops, rubs, or murmurs. No lower extremity edema. Pulmonary: positive  breath sounds bilaterally, adequate air movement, no wheezing, rhonchi or rales. Gastrointestinal. Abdomen with no organomegaly, non tender, no rebound or guarding/ positive bladder-cutaneous fistula with dressing in place. Positive scrotal edema.  Skin. No rashes Musculoskeletal: no joint deformities     Data Reviewed: I have personally reviewed following labs and imaging studies  CBC: Recent Labs  Lab 07/24/18 0935 07/24/18 2058 07/25/18 0442 07/26/18 0320  WBC 13.0* 18.9* 19.8* 17.8*  NEUTROABS 11.5*  --   --   --   HGB 6.4* 7.2* 8.2* 7.3*  HCT 21.0* 22.9* 26.4* 22.8*  MCV 93.8 90.2 93.3 90.8  PLT 360 369 367 536   Basic Metabolic Panel: Recent Labs  Lab 07/24/18 0935 07/25/18 0442 07/26/18 0320  NA 132* 139 137  K 4.0 4.1 4.0  CL 99 107 105  CO2 23 23 23   GLUCOSE 245* 149* 129*  BUN 33* 18 18  CREATININE 2.61* 1.80* 1.47*  CALCIUM 8.9 8.5* 8.4*  MG 2.0 1.6* 2.2  PHOS 2.6 2.4* 2.6   GFR: Estimated Creatinine Clearance: 46.5 mL/min (A) (by C-G formula based on SCr of 1.47 mg/dL (H)). Liver Function Tests:  Recent Labs  Lab 07/24/18 0935  AST 17  17  ALT 12  11  ALKPHOS 77  79  BILITOT 0.4  0.4  PROT 6.3*  6.3*  ALBUMIN 1.8*  1.9*   No results for input(s): LIPASE, AMYLASE in the last 168 hours. No results for input(s): AMMONIA in the last 168 hours. Coagulation Profile: Recent Labs  Lab 07/24/18 0935  INR 1.6*   Cardiac Enzymes: No results for input(s): CKTOTAL, CKMB, CKMBINDEX, TROPONINI in the last 168 hours. BNP (last 3 results) No results for input(s): PROBNP in the last 8760 hours. HbA1C: No results for input(s): HGBA1C in the last 72 hours. CBG: Recent Labs  Lab 07/25/18 1552 07/25/18 1959 07/26/18 0003 07/26/18 0343 07/26/18 0813  GLUCAP 170* 146* 206* 106* 123*   Lipid Profile: No results for input(s): CHOL, HDL, LDLCALC, TRIG, CHOLHDL, LDLDIRECT in the last 72 hours. Thyroid Function Tests: No results for input(s): TSH, T4TOTAL, FREET4, T3FREE, THYROIDAB in the last 72 hours. Anemia Panel: No results for input(s): VITAMINB12, FOLATE, FERRITIN,  TIBC, IRON, RETICCTPCT in the last 72 hours.    Radiology Studies: I have reviewed all of the imaging during this hospital visit personally     Scheduled Meds: . baclofen  20 mg Oral TID  . Chlorhexidine Gluconate Cloth  6 each Topical Daily  . feeding supplement  1 Container Oral TID BM  . insulin aspart  0-9 Units Subcutaneous Q4H  . pantoprazole (PROTONIX) IV  40 mg Intravenous QHS  . rOPINIRole  0.25 mg Oral QHS  . sodium chloride flush  10-40 mL Intracatheter Q12H   Continuous Infusions: . ceFEPime (MAXIPIME) IV 2 g (07/26/18 0905)  . lactated ringers 50 mL/hr at 07/26/18 0800  . norepinephrine (LEVOPHED) Adult infusion Stopped (07/25/18 1158)  . vancomycin Stopped (07/25/18 1329)     LOS: 2 days        Lynsi Dooner Gerome Apley, MD

## 2018-07-26 NOTE — Progress Notes (Signed)
Pt was requesting for cooling blanket he feel he has temp , his temp was 98 orally and I gave him a fan and I opened his door.

## 2018-07-26 NOTE — Consult Note (Signed)
Urology Consult Note   Reason for Consult:  Bladder cancer in extrophy patient  Initial HPI 7/17: Andrew Joyce is seen in consultation for at the request of Kipp Brood, MD for evaluation of fungating bladder mass and infection.   58 y.o. male with history of congenital bladder extrophy. Seen initially at [facility] on 07/01/17 with a RUS that showed right hydronephrosis and a 4.7 cm anterior bladder mass.  Underwent VIR bilateral PCN on 7/10.  Underwent TURBT on 7/17. 7 x 4cm well demarcated pedunculated mass, extremely thin bladder wall.  Pathology showed invasive [place] w/ prominent squamous features, focal glandular features, and myxoid stroma suggestive of sarcomatoid carcinoma.  Obtained staging PET/CT C/A/P to further evaluate nodal disease. Imaging 08/16/18 demonstrated enlarged right external iliac chain lymph node concerning for metastasis  He was seen at [facility] on 08/18/17.  Plan was for non cisplatin Chemotherapy and then follow up with [facility] after to discuss if surgery was even an options  He received 3 cycles of Gem/Carbo as well as palliative XRT here in [place]. Only current therapy is Pembro for progressing disease.  Gets his neph tubes exchanged frequently at [facility]. Right last changed 06/09/18. Left last exchanged 06/30/18  Of note he has a significant cardiac hx - CAD s/p CABG in 2008, NSTEMI 2013. No prior surgery. His cardiologist is Dr. Peter Martinique in [place]. Last saw in July, 2019  Interval 07/25/18:  Pressor requirement improving, norepinephrine at 6 from 12.  Febrile to 39.4 last night but remains on broad-spectrum vancomycin, cefepime, Flagyl.  Local wound care being performed. Nephrostomy tubes to drainage.  Urine culture pending  07/26/18: Last fever 7/18 PM. now off pressors. Good UOP from nephrostomy tubes (L>R). Cr downtrending, 1.4 from 1.8. WBC still elevated at 17, Hgb 7.3 from 8.2. Urine culture in process, remains on vac/cefepime. Still needs wound  care consult  Erythema and scortal induration/pain slightly improved, definitely not worsening.  Overall no changes to urology plan  Objective   Vital signs in last 24 hours: BP 134/68 (BP Location: Left Arm)   Pulse 84   Temp 99.3 F (37.4 C) (Core)   Resp (!) 25   Ht 5\' 10"  (1.778 m)   Wt 61.5 kg   SpO2 99%   BMI 19.45 kg/m   EXAM:  GU 7/19: Fungating bladder mass extending through anterior abdominal wall.  Clean dressing.  Erythema surrounding mass and extending to tender scrotum slightly improved (not extending)  ------  Assessment:  58 y.o. male with bladder extrophy and now bladder cancer that is progressive despite chemotherapy, radiation, salvage chemotherapy.  His urine is diverted with bilateral nephrostomy tubes.  Admitted for infection/sepsis.  Possible sources include urine, fungating bladder mass, epididymoorchitis.  Debriding his mass would b e a morbid procedure and may cause further injury.  Any debridement would not likely heal on its own as it would be an incomplete resection.  Exam improving with IV antibiotics, will not likely need surgical intervention. Overall recommend local wound care and antibiotics.   Recommendations: - Agree with empiric antibiotics to cover urinary source, cellulitis, epididymitis  -After empiric antibiotics would need course of oral antibiotics to treat epididymoorchitis  -Follow-up urine culture.  Unclear where urine was taken previously but ideal culture would be from bilateral nephrostomy tubes prior to antibiotics  -Agree with wound care consult.  Our recommendations would be every 6 hour saline washes with wet to dry dressings for partial wound debridement  -Continue bilateral nephrostomy tubes to  drainage.  Unless fungal urine culture these tubes do not need to be exchanged for infection.-Flush each nephrostomy tube with 5 cc twice daily  -Please keep the scrotum elevated with a towel underneath the scrotum to assist  with drainage of edema  Thank you for this consult.    Daytime Hours: Please contact the Dr. Beatrix Fetters (Dr. Louis Meckel will sign out patient)   If after 5PM weekdays or anytime weekends: please contact urology on call pager

## 2018-07-26 NOTE — Plan of Care (Signed)
Pt new admit for septic shock.

## 2018-07-26 NOTE — Progress Notes (Signed)
Pt new admit form ED alert and oriented , Dx septic shock, with left nephrostomy tube more output than right nephrostomy tube, mid abdominal dressing was changed at 1500  q 6h, right porta cath, rt forearm peripheral IV, no complain of pain at this time, but he vomited while transferring from ED to the floor.

## 2018-07-27 DIAGNOSIS — E43 Unspecified severe protein-calorie malnutrition: Secondary | ICD-10-CM | POA: Insufficient documentation

## 2018-07-27 LAB — CBC WITH DIFFERENTIAL/PLATELET
Abs Immature Granulocytes: 0.2 10*3/uL — ABNORMAL HIGH (ref 0.00–0.07)
Basophils Absolute: 0 10*3/uL (ref 0.0–0.1)
Basophils Relative: 0 %
Eosinophils Absolute: 0.3 10*3/uL (ref 0.0–0.5)
Eosinophils Relative: 2 %
HCT: 22.3 % — ABNORMAL LOW (ref 39.0–52.0)
Hemoglobin: 7.1 g/dL — ABNORMAL LOW (ref 13.0–17.0)
Immature Granulocytes: 1 %
Lymphocytes Relative: 3 %
Lymphs Abs: 0.5 10*3/uL — ABNORMAL LOW (ref 0.7–4.0)
MCH: 28.6 pg (ref 26.0–34.0)
MCHC: 31.8 g/dL (ref 30.0–36.0)
MCV: 89.9 fL (ref 80.0–100.0)
Monocytes Absolute: 0.4 10*3/uL (ref 0.1–1.0)
Monocytes Relative: 3 %
Neutro Abs: 14.2 10*3/uL — ABNORMAL HIGH (ref 1.7–7.7)
Neutrophils Relative %: 91 %
Platelets: 326 10*3/uL (ref 150–400)
RBC: 2.48 MIL/uL — ABNORMAL LOW (ref 4.22–5.81)
RDW: 15.8 % — ABNORMAL HIGH (ref 11.5–15.5)
WBC: 15.6 10*3/uL — ABNORMAL HIGH (ref 4.0–10.5)
nRBC: 0 % (ref 0.0–0.2)

## 2018-07-27 LAB — BASIC METABOLIC PANEL
Anion gap: 9 (ref 5–15)
BUN: 19 mg/dL (ref 6–20)
CO2: 23 mmol/L (ref 22–32)
Calcium: 8.5 mg/dL — ABNORMAL LOW (ref 8.9–10.3)
Chloride: 102 mmol/L (ref 98–111)
Creatinine, Ser: 1.25 mg/dL — ABNORMAL HIGH (ref 0.61–1.24)
GFR calc Af Amer: >60 mL/min (ref >60)
GFR calc non Af Amer: >60 mL/min (ref >60)
Glucose, Bld: 149 mg/dL — ABNORMAL HIGH (ref 70–99)
Potassium: 3.5 mmol/L (ref 3.5–5.1)
Sodium: 134 mmol/L — ABNORMAL LOW (ref 135–145)

## 2018-07-27 LAB — GLUCOSE, CAPILLARY
Glucose-Capillary: 137 mg/dL — ABNORMAL HIGH (ref 70–99)
Glucose-Capillary: 137 mg/dL — ABNORMAL HIGH (ref 70–99)
Glucose-Capillary: 167 mg/dL — ABNORMAL HIGH (ref 70–99)
Glucose-Capillary: 145 mg/dL — ABNORMAL HIGH (ref 70–99)
Glucose-Capillary: 101 mg/dL — ABNORMAL HIGH (ref 70–99)

## 2018-07-27 LAB — PREPARE RBC (CROSSMATCH)

## 2018-07-27 MED ORDER — DULOXETINE HCL 20 MG PO CPEP
20.0000 mg | ORAL_CAPSULE | Freq: Every day | ORAL | Status: DC
  Administered 2018-07-27 – 2018-07-29 (×3): 20 mg via ORAL
  Filled 2018-07-27 (×3): qty 1

## 2018-07-27 MED ORDER — ZOLPIDEM TARTRATE 5 MG PO TABS
5.0000 mg | ORAL_TABLET | Freq: Every day | ORAL | Status: DC
  Administered 2018-07-27 – 2018-07-28 (×2): 5 mg via ORAL
  Filled 2018-07-27 (×2): qty 1

## 2018-07-27 MED ORDER — CARBAMAZEPINE ER 100 MG PO TB12
100.0000 mg | ORAL_TABLET | Freq: Two times a day (BID) | ORAL | Status: DC
  Administered 2018-07-27 – 2018-07-29 (×5): 100 mg via ORAL
  Filled 2018-07-27 (×6): qty 1

## 2018-07-27 MED ORDER — ROSUVASTATIN CALCIUM 20 MG PO TABS
40.0000 mg | ORAL_TABLET | Freq: Every day | ORAL | Status: DC
  Administered 2018-07-27 – 2018-07-29 (×3): 40 mg via ORAL
  Filled 2018-07-27 (×3): qty 2

## 2018-07-27 MED ORDER — ACETAMINOPHEN 325 MG PO TABS
650.0000 mg | ORAL_TABLET | Freq: Once | ORAL | Status: AC
  Administered 2018-07-27: 650 mg via ORAL
  Filled 2018-07-27: qty 2

## 2018-07-27 MED ORDER — SODIUM CHLORIDE 0.9% IV SOLUTION
Freq: Once | INTRAVENOUS | Status: AC
  Administered 2018-07-27: 15:00:00 via INTRAVENOUS

## 2018-07-27 MED ORDER — GABAPENTIN 300 MG PO CAPS
300.0000 mg | ORAL_CAPSULE | Freq: Every day | ORAL | Status: DC
  Administered 2018-07-27 – 2018-07-28 (×2): 300 mg via ORAL
  Filled 2018-07-27 (×2): qty 1

## 2018-07-27 MED ORDER — FENOFIBRATE 160 MG PO TABS
160.0000 mg | ORAL_TABLET | Freq: Every day | ORAL | Status: DC
  Administered 2018-07-27 – 2018-07-29 (×3): 160 mg via ORAL
  Filled 2018-07-27 (×3): qty 1

## 2018-07-27 MED ORDER — ISOSORBIDE MONONITRATE ER 30 MG PO TB24
15.0000 mg | ORAL_TABLET | Freq: Every day | ORAL | Status: DC
  Administered 2018-07-27 – 2018-07-29 (×3): 15 mg via ORAL
  Filled 2018-07-27 (×3): qty 1

## 2018-07-27 MED ORDER — ADULT MULTIVITAMIN W/MINERALS CH
1.0000 | ORAL_TABLET | Freq: Every day | ORAL | Status: DC
  Administered 2018-07-27 – 2018-07-29 (×3): 1 via ORAL
  Filled 2018-07-27 (×3): qty 1

## 2018-07-27 MED ORDER — ENSURE ENLIVE PO LIQD
237.0000 mL | Freq: Three times a day (TID) | ORAL | Status: DC
  Administered 2018-07-27 – 2018-07-29 (×7): 237 mL via ORAL

## 2018-07-27 NOTE — Progress Notes (Signed)
PROGRESS NOTE  Herve Haug WEX:[id number] DOB: [birth year] DOA: 07/24/2018 PCP: [name], NP  Brief History   58 year old male Urothelial CA-TURBT 01/23/2017+ gemcitabine/cisplatin, XRT finishing 03/03/2018-salvage therapy candidate-goals of care is palliative Status post PCN tubes with multiple dysfunction-prior vesicle abdominal fistula with prior XRT CAD status post CABG 2008-NSTEMI 02/2011 (no targets) reported class I angina Orthostatic hypotension Severe hypertriglyceridemia  Also has congenital bladder exstrophy-urinary diversion bilateral nephrostomies admitted with sepsis unclear cause   Admitted by critical care medicine secondary to septic shock, acute kidney injury on admission, lactic acidosis-required fluid resuscitation, IV pressors Urology saw the patient did not think debridement of the mass would add much  Transferred to hospitalist service 7/20 a.m.     A & P  Complicated urinary infection cause for sepsis Bilateral nephrostomies Cutaneous bladder fistula and epididymoorchitis Hemodynamically stable Prior urinary cultures multiple organisms sensitive to cephalosporin-continue current antibiotics de-escalate when cultures return Anemia of blood loss acute + hemodilution possible Hemoglobin now 7.1 will transfuse 1 more unit PRBC Stage IV urothelial cancer Exophytic mass Adding Dr. Alen Blew to treatment team-patient is on salvage chemotherapy?  Options?  Kindly address full CODE STATUS? Continue baclofen 20 3 times daily CAD CABG 2008-no targets for stenting 2013 Orthostatic hypotension Low normal pressures-slow resumption Imdur 15 daily not on beta-blocker presumably because of orthostasis ?  Aspirin DM TY 2 with complications of neuropathy Resume l gabapentin bedtime continue sliding scale coverage Depression, situational Resume Cymbalta 20 daily and Tegretol 100 twice daily Severe hypertriglyceridemia Resuming fenofibrate 160 daily,  statin  DVT prophylaxis: SCD at this time given low blood count Code Status: Full code confirmed at bedside Family Communication: None Disposition Plan: Inpatient pending resolution   Verneita Griffes, MD [facility] 10:32 AM  07/27/2018, 10:32 AM  LOS: 3 days   Consultants  . Urologist . Added oncologist to treatment team  Procedures  . Multiple  Antibiotics  . Ceftriaxone and doxycycline  Interval History/Subjective  Awake alert coherent no pain No chest pain No fever No distress eating drinking was up out in the chair earlier today at baseline ambulatory  Objective   Vitals:  Vitals:   07/27/18 0206 07/27/18 0521  BP: 112/63 127/63  Pulse: 71 82  Resp:    Temp: (!) 97.5 F (36.4 C) 98.6 F (37 C)  SpO2: 96% 96%    Exam:     I have personally reviewed the following:   Today's Data  . None  Lab Data   . Hemoglobin back down from 8.22 days ago to currently 7.1-transfusing . White count down from 19-15.6 . Platelets good 326  Micro Data  Await reintubated urine cultures 7/17 Blood culture X2 7/17 no growth  Imaging  .   Cardiology Data  . None  Other Data  . None  Scheduled Meds: . sodium chloride   Intravenous Once  . acetaminophen  650 mg Oral Once  . baclofen  20 mg Oral TID  . carbamazepine  100 mg Oral BID  . Chlorhexidine Gluconate Cloth  6 each Topical Daily  . doxycycline  100 mg Oral Q12H  . DULoxetine  20 mg Oral Daily  . feeding supplement  1 Container Oral TID BM  . fenofibrate  160 mg Oral Daily  . gabapentin  300 mg Oral QHS  . insulin aspart  0-9 Units Subcutaneous Q4H  . isosorbide mononitrate  15 mg Oral Daily  . pantoprazole (PROTONIX) IV  40 mg Intravenous QHS  . rOPINIRole  0.25  mg Oral QHS  . rosuvastatin  40 mg Oral Daily  . sodium chloride flush  10-40 mL Intracatheter Q12H  . zolpidem  5 mg Oral QHS   Continuous Infusions: . cefTRIAXone (ROCEPHIN)  IV 1 g (07/26/18 1322)    Principal Problem:    Septic shock ([place]) Active Problems:   CAD (coronary artery disease)   HTN (hypertension)   Hyperlipidemia   Diabetes mellitus ([place])   Malignant neoplasm of urinary bladder (HCC)   Type 2 diabetes mellitus ([place])   LOS: 3 days   How to contact the [facility] Attending or Consulting provider [place] or covering provider during after hours [place], for this patient?  1. Check the care team in [facility] and look for a) attending/consulting TRH provider listed and b) the [facility] team listed 2. Log into [email or web address] and use [place]'s universal password to access. If you do not have the password, please contact the hospital operator. 3. Locate the [facility] provider you are looking for under [facility] and page to a number that you can be directly reached. 4. If you still have difficulty reaching the provider, please page the [facility] (Director on Call) for the Hospitalists listed on amion for assistance.

## 2018-07-27 NOTE — Progress Notes (Addendum)
PT Cancellation Note  Patient Details Name: Maksym Pfiffner MRN: [id number] DOB: 06/15/1960   Cancelled Treatment:    Reason Eval/Treat Not Completed: Patient declined, no reason specified. States maybe later but not right now. Will re-attempt later if time allows. Re-attempted 2 hours later, patient continues to decline.      Maddoxx Burkitt 07/27/2018, 1:40 PM

## 2018-07-27 NOTE — Progress Notes (Signed)
Unable to weigh patient. Bed has not been zeroed out. Showing inaccurate weight. Pt is unable to stand up at this time.

## 2018-07-27 NOTE — Progress Notes (Signed)
Pt 1st unit of PRBC completed at 1800 no s/s of blood transfusion reaction noted.

## 2018-07-27 NOTE — Progress Notes (Signed)
Initial Nutrition Assessment  DOCUMENTATION CODES:   Severe malnutrition in context of chronic illness  INTERVENTION:   -D/c Boost Breeze po TID, each supplement provides 250 kcal and 9 grams of protein -Ensure Enlive po TID, each supplement provides 350 kcal and 20 grams of protein -MVI with minerals daily -Magic cup TID with meals, each supplement provides 290 kcal and 9 grams of protein  NUTRITION DIAGNOSIS:   Severe Malnutrition related to chronic illness(stage IV bladder cancer) as evidenced by moderate fat depletion, severe fat depletion, moderate muscle depletion, severe muscle depletion.  GOAL:   Patient will meet greater than or equal to 90% of their needs  MONITOR:   PO intake, Supplement acceptance, Labs, Weight trends, Skin, I & O's  REASON FOR ASSESSMENT:   Malnutrition Screening Tool, Consult Assessment of nutrition requirement/status  ASSESSMENT:   Andrew Joyce is a 58 y.o. year-old male with a history of CKD, CAD, Stage IV bladder cancer diagnosed in 2019 with pelvic lymph node involvement  with biopsy proven to be high-grade urothelial carcinoma with sarcomatoid features. He  presented to the ED with chief complaint of fall.He fell in the bathroom x 2 today, endorsing head trauma, no loss consciousness.  Patient has fever that he states has been ongoing x 2 weeks, up to 102. Per EMS he has a fissure to his right groin that had become infected. He endorses poor appetite x 1 week. He was due for chemo today.  Pt admitted with suspected septic shock.   Reviewed I/O's: -481 ml x 24 hours amd +1.2 L since admission  UOP: 1.2 : x 24 hours  Case discussed with RN prior to visit, who reports that pt is alert and oriented. He was up eating meals yesterday. Noted meal completion 100%. However, observed breakfast tray, which revealed pt consumed only a cup of orange juice. Boost Breeze supplement provided, however, pt consumed only a few sips.   Pt sleeping  soundly at time of visit and did not arouse. Spoke with mobility tech, who reports that she tried to arouse pt just prior to RD visit without success. Per mobility tech, pt is very weak and will be evaluated by PT later today and suspects he will be fatigued with multiple treatments.   Reviewed wt hx; noted pt has experienced a 3.9% wt loss over the past 3 months, which is not significant for time frame. However, concerning given current diagnosis and malnutrition.   Pt with poor oral intake and would benefit from nutrient dense supplement. One Ensure Enlive supplement provides 350 kcals, 20 grams protein, and 44-45 grams of carbohydrate vs one Glucerna shake supplement, which provides 220 kcals, 10 grams of protein, and 26 grams of carbohydrate. Given pt's hx of DM, RD will continue to monitor PO intake, CBGS, and adjust supplement regimen as appropriate.   Lab Results  Component Value Date   HGBA1C 5.8 (H) 03/22/2018   PTA DM medications are 50 mg sitagliptin .   Labs reviewed: Na: 134, CBGS: 135-137 (inpatient orders for glycemic control are 0-9 units insulin aspart TID with meals, ).   NUTRITION - FOCUSED PHYSICAL EXAM:    Most Recent Value  Orbital Region  Severe depletion  Upper Arm Region  Moderate depletion  Thoracic and Lumbar Region  Unable to assess  Buccal Region  Severe depletion  Temple Region  Severe depletion  Clavicle Bone Region  Severe depletion  Clavicle and Acromion Bone Region  Severe depletion  Scapular Bone Region  Severe  depletion  Dorsal Hand  Severe depletion  Patellar Region  Moderate depletion  Anterior Thigh Region  Moderate depletion  Posterior Calf Region  Moderate depletion  Edema (RD Assessment)  None  Hair  Reviewed  Eyes  Reviewed  Mouth  Reviewed  Skin  Reviewed  Nails  Reviewed       Diet Order:   Diet Order            Diet heart healthy/carb modified Room service appropriate? Yes; Fluid consistency: Thin  Diet effective now               EDUCATION NEEDS:   No education needs have been identified at this time  Skin:  Skin Assessment: Skin Integrity Issues: Skin Integrity Issues:: Other (Comment) Other: Cutaneous bladder fistula with cellulitis and wound  Last BM:  07/26/18  Height:   Ht Readings from Last 1 Encounters:  07/24/18 5\' 10"  (1.778 m)    Weight:   Wt Readings from Last 1 Encounters:  07/26/18 61.5 kg    Ideal Body Weight:     BMI:  Body mass index is 19.45 kg/m.  Estimated Nutritional Needs:   Kcal:  1950-2150  Protein:  105-120 grams  Fluid:  > 1.9 L    Valeda Corzine A. Jimmye Norman, RD, LDN, [place] Registered Dietitian II Certified Diabetes Care and Education Specialist Pager: [phone] After hours Pager: [phone]

## 2018-07-27 NOTE — Progress Notes (Signed)
Pt hgb 7.1 ordered 1 unit of PRBC with consent,  verified by another RN, v/s stable will continue to monitor s/s of blood transfusion reaction.

## 2018-07-27 NOTE — TOC Initial Note (Addendum)
Transition of Care [facility]) - Initial/Assessment Note    Patient Details  Name: Andrew Joyce MRN: [id number] Date of Birth: 16-Apr-1960  Transition of Care [facility]) CM/SW Contact:    Marilu Favre, RN Phone Number: 07/27/2018, 4:40 PM  Clinical Narrative:                 Received a voice mail from patient's wife requesting NCM to call her back.   Spoke with patient at bedside , confirmed facesheet information. Explained patient's wife wanted NCM to call her back regarding going home with hospice. Patient consented.   Spoke with Ubaldo Glassing [phone], she is requesting MD to call her , would like palliative care consult and [facility] . At present patient has no DME at home , and no home health. Spoke to attending, he has already spoke to patient's wife today and explained he would like to speak with oncology to coordinate palliative / hospice.  Wife aware.  Attending spoke with Dr  Alen Blew who has approved a referral to OP Palliative . Wife aware wants [facility] for home palliative. Advance left message awaiting call back. Will follow up in morning   Expected Discharge Plan: [place]     Patient Goals and CMS Choice Patient states their goals for this hospitalization and ongoing recovery are:: to go home CMS Medicare.gov Compare Post Acute Care list provided to:: Patient Choice offered to / list presented to : Patient, Spouse  Expected Discharge Plan and Services Expected Discharge Plan: [place] Acute Care Choice: Hospice Living arrangements for the past 2 months: Single Family Home Expected Discharge Date: 07/29/18               DME Arranged: N/A         HH Arranged: NA          Prior Living Arrangements/Services Living arrangements for the past 2 months: Single Family Home Lives with:: Spouse Patient language and need for interpreter reviewed:: Yes Do you feel safe  going back to the place where you live?: Yes      Need for Family Participation in Patient Care: Yes (Comment) Care giver support system in place?: Yes (comment)   Criminal Activity/Legal Involvement Pertinent to Current Situation/Hospitalization: No - Comment as needed  Activities of Daily Living Home Assistive Devices/Equipment: None ADL Screening (condition at time of admission) Patient's cognitive ability adequate to safely complete daily activities?: No Is the patient deaf or have difficulty hearing?: No Does the patient have difficulty seeing, even when wearing glasses/contacts?: No Does the patient have difficulty concentrating, remembering, or making decisions?: No Patient able to express need for assistance with ADLs?: No Does the patient have difficulty dressing or bathing?: No Independently performs ADLs?: Yes (appropriate for developmental age) Communication: Independent Dressing (OT): Independent Grooming: Independent Feeding: Independent Bathing: Independent Is this a change from baseline?: Pre-admission baseline Toileting: Independent Is this a change from baseline?: Pre-admission baseline In/Out Bed: Independent Is this a change from baseline?: Pre-admission baseline Walks in Home: Independent Does the patient have difficulty walking or climbing stairs?: No Weakness of Legs: Both Weakness of Arms/Hands: None  Permission Sought/Granted   Permission granted to share information with : Yes, Verbal Permission Granted  Share Information with NAME: Ubaldo Glassing [phone] wife  Permission granted to share info w AGENCY: [facility] granted to share info  w Relationship: yes     Emotional Assessment Appearance:: Appears stated age Attitude/Demeanor/Rapport: Engaged Affect (typically observed): Accepting Orientation: : Oriented to Self, Oriented to Place, Oriented to  Time, Oriented to Situation Alcohol / Substance Use: Not  Applicable Psych Involvement: No (comment)  Admission diagnosis:  Shock ([place]) [R57.9] Symptomatic anemia [D64.9] Sepsis, due to unspecified organism, unspecified whether acute organ dysfunction present [facility]) [A41.9] Patient Active Problem List   Diagnosis Date Noted  . Septic shock ([place]) 07/24/2018  . Sepsis ([place]) 03/22/2018  . Anemia 03/22/2018  . Type 2 diabetes mellitus ([place]) 03/22/2018  . Pyelonephritis 03/21/2018  . Hydronephrosis 03/21/2018  . Insomnia 02/25/2018  . Port-A-Cath in place 09/23/2017  . Malignant neoplasm of urinary bladder ([place]) 09/04/2017  . Diabetes mellitus ([place]) 08/08/2017  . Goals of care, counseling/discussion 01/21/2017  . Benign fasciculation-cramp syndrome 08/22/2015  . Chronic thoracic back pain 03/20/2015  . Fasciculation of lower extremity 11/18/2014  . Bilateral thoracic back pain 09/28/2014  . Nonallopathic lesion-rib cage 07/14/2014  . Nonallopathic lesion of thoracic region 07/14/2014  . Nonallopathic lesion of cervical region 07/14/2014  . Posture imbalance 04/13/2014  . CKD (chronic kidney disease) stage 3, GFR 30-59 ml/min (HCC) 06/01/2013  . Angina pectoris ([place]) 09/18/2011  . Dyspnea 06/13/2011  . NSTEMI (non-ST elevated myocardial infarction) ([place]) 02/27/2011  . CAD (coronary artery disease) 02/06/2011  . HTN (hypertension) 02/06/2011  . Hyperlipidemia 02/06/2011   PCP:  [name], NP Pharmacy:   [facility], [place] [address] Phone: [phone] Fax: [phone]     Social Determinants of Health (SDOH) Interventions    Readmission Risk Interventions No flowsheet data found.

## 2018-07-27 NOTE — Consult Note (Signed)
[place] Nurse wound consult note Reason for Consult: Cutaneous bladder fistula with cellulitis and wound Bilateral nephrostomy tubes, intact and draining Wound type:fistula Pressure Injury POA: NA Drainage (amount, consistency, odor) Minimal purulence Periwound:eryhema to fistula present on mons pubis Dressing procedure/placement/frequency: Cleanse wound to mons pubis with NS. Apply Ns moist gauze to wound bed. Cover with ABd pad and tape.  Change twice each shift per urology orders.  Will not follow at this time.  Please re-consult if needed.  Domenic Moras MSN, RN, FNP-BC CWON Wound, Ostomy, Continence Nurse Pager [phone]

## 2018-07-27 NOTE — Progress Notes (Signed)
Pt has been nauseous and had multiple episodes of emesis. Did not feel relief after giving zofran. Pt requested do the dressing change later. Pt would like to rest at the moment. Will continue to monitor.   Fransico Michael, RN

## 2018-07-27 NOTE — Progress Notes (Signed)
Subjective: Patient reports no pain.   Objective: Vital signs in last 24 hours: Temp:  [97.5 F (36.4 C)-98.9 F (37.2 C)] 98.6 F (37 C) (07/20 2037) Pulse Rate:  [65-87] 73 (07/20 2037) Resp:  [16-18] 18 (07/20 2037) BP: (90-127)/(59-69) 90/60 (07/20 2037) SpO2:  [96 %-100 %] 100 % (07/20 2037)  Intake/Output from previous day: 07/19 0701 - 07/20 0700 In: 718.9 [P.O.:120; I.V.:300.4; IV Piggyback:298.5] Out: 1200 [Urine:1200] Intake/Output this shift: No intake/output data recorded.  Physical Exam:  Constitutional: Vital signs reviewed. WD WN in NAD   Eyes: PERRL, No scleral icterus.   Cardiovascular: Reg rate Pulmonary/Chest: Normal effort Abdominal: Fungating mass in sp area. Dressing changed. No significant surrounding erythema.   Lab Results: Recent Labs    07/25/18 0442 07/26/18 0320 07/27/18 0340  HGB 8.2* 7.3* 7.1*  HCT 26.4* 22.8* 22.3*   BMET Recent Labs    07/26/18 0320 07/27/18 0340  NA 137 134*  K 4.0 3.5  CL 105 102  CO2 23 23  GLUCOSE 129* 149*  BUN 18 19  CREATININE 1.47* 1.25*  CALCIUM 8.4* 8.5*   No results for input(s): LABPT, INR in the last 72 hours. No results for input(s): LABURIN in the last 72 hours. Results for orders placed or performed during the hospital encounter of 07/24/18  SARS Coronavirus 2 (CEPHEID- Performed in [place] hospital lab), Hosp Order     Status: None   Collection Time: 07/24/18  9:35 AM   Specimen: Nasopharyngeal Swab  Result Value Ref Range Status   SARS Coronavirus 2 NEGATIVE NEGATIVE Final    Comment: (NOTE) If result is NEGATIVE SARS-CoV-2 target nucleic acids are NOT DETECTED. The SARS-CoV-2 RNA is generally detectable in upper and lower  respiratory specimens during the acute phase of infection. The lowest  concentration of SARS-CoV-2 viral copies this assay can detect is 250  copies / mL. A negative result does not preclude SARS-CoV-2 infection  and should not be used as the sole basis for  treatment or other  patient management decisions.  A negative result may occur with  improper specimen collection / handling, submission of specimen other  than nasopharyngeal swab, presence of viral mutation(s) within the  areas targeted by this assay, and inadequate number of viral copies  (<250 copies / mL). A negative result must be combined with clinical  observations, patient history, and epidemiological information. If result is POSITIVE SARS-CoV-2 target nucleic acids are DETECTED. The SARS-CoV-2 RNA is generally detectable in upper and lower  respiratory specimens dur ing the acute phase of infection.  Positive  results are indicative of active infection with SARS-CoV-2.  Clinical  correlation with patient history and other diagnostic information is  necessary to determine patient infection status.  Positive results do  not rule out bacterial infection or co-infection with other viruses. If result is PRESUMPTIVE POSTIVE SARS-CoV-2 nucleic acids MAY BE PRESENT.   A presumptive positive result was obtained on the submitted specimen  and confirmed on repeat testing.  While 2019 novel coronavirus  (SARS-CoV-2) nucleic acids may be present in the submitted sample  additional confirmatory testing may be necessary for epidemiological  and / or clinical management purposes  to differentiate between  SARS-CoV-2 and other Sarbecovirus currently known to infect humans.  If clinically indicated additional testing with an alternate test  methodology [id number]) is advised. The SARS-CoV-2 RNA is generally  detectable in upper and lower respiratory sp ecimens during the acute  phase of infection. The expected result is  Negative. Fact Sheet for Patients:  StrictlyIdeas.no Fact Sheet for Healthcare Providers: BankingDealers.co.za This test is not yet approved or cleared by the [place] FDA and has been authorized for detection and/or  diagnosis of SARS-CoV-2 by FDA under an Emergency Use Authorization (EUA).  This EUA will remain in effect (meaning this test can be used) for the duration of the COVID-19 declaration under Section 564(b)(1) of the Act, 21 U.S.C. section 360bbb-3(b)(1), unless the authorization is terminated or revoked sooner. Performed at [facility], [place] [address] [place], [place] [address]   Blood Culture (routine x 2)     Status: None (Preliminary result)   Collection Time: 07/24/18  9:54 AM   Specimen: BLOOD RIGHT FOREARM  Result Value Ref Range Status   Specimen Description BLOOD RIGHT FOREARM  Final   Special Requests   Final    BOTTLES DRAWN AEROBIC AND ANAEROBIC Blood Culture results may not be optimal due to an excessive volume of blood received in culture bottles   Culture   Final    NO GROWTH 3 DAYS Performed at [facility], [place] [address] [place], [place] [address]    Report Status PENDING  Incomplete  Blood Culture (routine x 2)     Status: None (Preliminary result)   Collection Time: 07/24/18  9:54 AM   Specimen: BLOOD RIGHT WRIST  Result Value Ref Range Status   Specimen Description BLOOD RIGHT WRIST  Final   Special Requests   Final    BOTTLES DRAWN AEROBIC AND ANAEROBIC Blood Culture adequate volume   Culture   Final    NO GROWTH 3 DAYS Performed at [facility], [place] [address], [place], [place] [address]    Report Status PENDING  Incomplete  Urine culture     Status: Abnormal (Preliminary result)   Collection Time: 07/24/18 10:23 AM   Specimen: In/Out Cath Urine  Result Value Ref Range Status   Specimen Description IN/OUT CATH URINE  Final   Special Requests NONE  Final   Culture (A)  Final    >=100,000 COLONIES/mL PROTEUS PENNERI >=100,000 COLONIES/mL MORGANELLA MORGANII CULTURE REINCUBATED FOR BETTER GROWTH Performed at [facility], [place] [address], [place], [place] [address]    Report Status PENDING  Incomplete   Organism ID, Bacteria  PROTEUS PENNERI (A)  Final   Organism ID, Bacteria MORGANELLA MORGANII (A)  Final      Susceptibility   Morganella morganii - MIC*    AMPICILLIN >=32 RESISTANT Resistant     CEFAZOLIN >=64 RESISTANT Resistant     CEFTRIAXONE <=1 SENSITIVE Sensitive     CIPROFLOXACIN <=0.25 SENSITIVE Sensitive     GENTAMICIN <=1 SENSITIVE Sensitive     IMIPENEM 1 SENSITIVE Sensitive     NITROFURANTOIN 128 RESISTANT Resistant     TRIMETH/SULFA <=20 SENSITIVE Sensitive     AMPICILLIN/SULBACTAM 16 INTERMEDIATE Intermediate     PIP/TAZO <=4 SENSITIVE Sensitive     * >=100,000 COLONIES/mL MORGANELLA MORGANII   Proteus penneri - MIC*    AMPICILLIN >=32 RESISTANT Resistant     CEFAZOLIN >=64 RESISTANT Resistant     CEFTRIAXONE 8 SENSITIVE Sensitive     CIPROFLOXACIN <=0.25 SENSITIVE Sensitive     GENTAMICIN <=1 SENSITIVE Sensitive     IMIPENEM 4 SENSITIVE Sensitive     NITROFURANTOIN 128 RESISTANT Resistant     TRIMETH/SULFA <=20 SENSITIVE Sensitive     AMPICILLIN/SULBACTAM >=32 RESISTANT Resistant     PIP/TAZO <=4 SENSITIVE Sensitive     * >=  100,000 COLONIES/mL PROTEUS PENNERI  MRSA PCR Screening     Status: None   Collection Time: 07/24/18  3:08 PM   Specimen: Nasal Mucosa; Nasopharyngeal  Result Value Ref Range Status   MRSA by PCR NEGATIVE NEGATIVE Final    Comment:        The GeneXpert MRSA Assay (FDA approved for NASAL specimens only), is one component of a comprehensive MRSA colonization surveillance program. It is not intended to diagnose MRSA infection nor to guide or monitor treatment for MRSA infections. Performed at [facility], [place] [address], [place], [place] [address]     Studies/Results: No results found.  Assessment/Plan:   Difficult process--progressing TCCA invading abd wall w/ necrosis/surrounding cellulitis (infection improving).  I agree with intensive local wound care.  Will see if we can contact [facility] regarding further suggestions   LOS: 3 days    Jorja Loa 07/27/2018, 9:00 PM

## 2018-07-28 ENCOUNTER — Other Ambulatory Visit (HOSPITAL_COMMUNITY): Payer: Medicare Other

## 2018-07-28 DIAGNOSIS — D649 Anemia, unspecified: Secondary | ICD-10-CM

## 2018-07-28 LAB — RENAL FUNCTION PANEL
Albumin: 1.4 g/dL — ABNORMAL LOW (ref 3.5–5.0)
Anion gap: 7 (ref 5–15)
BUN: 24 mg/dL — ABNORMAL HIGH (ref 6–20)
CO2: 26 mmol/L (ref 22–32)
Calcium: 8.4 mg/dL — ABNORMAL LOW (ref 8.9–10.3)
Chloride: 104 mmol/L (ref 98–111)
Creatinine, Ser: 1.35 mg/dL — ABNORMAL HIGH (ref 0.61–1.24)
GFR calc Af Amer: >60 mL/min (ref >60)
GFR calc non Af Amer: 57 mL/min — ABNORMAL LOW (ref >60)
Glucose, Bld: 143 mg/dL — ABNORMAL HIGH (ref 70–99)
Phosphorus: 2.3 mg/dL — ABNORMAL LOW (ref 2.5–4.6)
Potassium: 3.5 mmol/L (ref 3.5–5.1)
Sodium: 137 mmol/L (ref 135–145)

## 2018-07-28 LAB — CBC
HCT: 26.7 % — ABNORMAL LOW (ref 39.0–52.0)
Hemoglobin: 8.6 g/dL — ABNORMAL LOW (ref 13.0–17.0)
MCH: 29 pg (ref 26.0–34.0)
MCHC: 32.2 g/dL (ref 30.0–36.0)
MCV: 89.9 fL (ref 80.0–100.0)
Platelets: 307 10*3/uL (ref 150–400)
RBC: 2.97 MIL/uL — ABNORMAL LOW (ref 4.22–5.81)
RDW: 15.4 % (ref 11.5–15.5)
WBC: 10.3 10*3/uL (ref 4.0–10.5)
nRBC: 0 % (ref 0.0–0.2)

## 2018-07-28 LAB — CBC WITH DIFFERENTIAL/PLATELET
Abs Immature Granulocytes: 0.1 10*3/uL — ABNORMAL HIGH (ref 0.00–0.07)
Basophils Absolute: 0 10*3/uL (ref 0.0–0.1)
Basophils Relative: 0 %
Eosinophils Absolute: 0.2 10*3/uL (ref 0.0–0.5)
Eosinophils Relative: 2 %
HCT: 23.1 % — ABNORMAL LOW (ref 39.0–52.0)
Hemoglobin: 7.3 g/dL — ABNORMAL LOW (ref 13.0–17.0)
Immature Granulocytes: 1 %
Lymphocytes Relative: 5 %
Lymphs Abs: 0.5 10*3/uL — ABNORMAL LOW (ref 0.7–4.0)
MCH: 28.5 pg (ref 26.0–34.0)
MCHC: 31.6 g/dL (ref 30.0–36.0)
MCV: 90.2 fL (ref 80.0–100.0)
Monocytes Absolute: 0.4 10*3/uL (ref 0.1–1.0)
Monocytes Relative: 4 %
Neutro Abs: 10.1 10*3/uL — ABNORMAL HIGH (ref 1.7–7.7)
Neutrophils Relative %: 88 %
Platelets: 300 10*3/uL (ref 150–400)
RBC: 2.56 MIL/uL — ABNORMAL LOW (ref 4.22–5.81)
RDW: 15.8 % — ABNORMAL HIGH (ref 11.5–15.5)
WBC: 11.4 10*3/uL — ABNORMAL HIGH (ref 4.0–10.5)
nRBC: 0 % (ref 0.0–0.2)

## 2018-07-28 LAB — GLUCOSE, CAPILLARY
Glucose-Capillary: 131 mg/dL — ABNORMAL HIGH (ref 70–99)
Glucose-Capillary: 132 mg/dL — ABNORMAL HIGH (ref 70–99)
Glucose-Capillary: 148 mg/dL — ABNORMAL HIGH (ref 70–99)
Glucose-Capillary: 166 mg/dL — ABNORMAL HIGH (ref 70–99)
Glucose-Capillary: 182 mg/dL — ABNORMAL HIGH (ref 70–99)
Glucose-Capillary: 214 mg/dL — ABNORMAL HIGH (ref 70–99)
Glucose-Capillary: 215 mg/dL — ABNORMAL HIGH (ref 70–99)
Glucose-Capillary: 216 mg/dL — ABNORMAL HIGH (ref 70–99)

## 2018-07-28 LAB — URINE CULTURE: Culture: >=100000 — AB

## 2018-07-28 LAB — HEMOGLOBIN AND HEMATOCRIT, BLOOD
HCT: 29.5 % — ABNORMAL LOW (ref 39.0–52.0)
Hemoglobin: 9.6 g/dL — ABNORMAL LOW (ref 13.0–17.0)

## 2018-07-28 LAB — PREPARE RBC (CROSSMATCH)

## 2018-07-28 LAB — VITAMIN B12: Vitamin B-12: 736 pg/mL (ref 180–914)

## 2018-07-28 MED ORDER — ACETAMINOPHEN 325 MG PO TABS
650.0000 mg | ORAL_TABLET | Freq: Once | ORAL | Status: AC
  Administered 2018-07-28: 650 mg via ORAL
  Filled 2018-07-28: qty 2

## 2018-07-28 MED ORDER — SULFAMETHOXAZOLE-TRIMETHOPRIM 800-160 MG PO TABS
1.0000 | ORAL_TABLET | Freq: Two times a day (BID) | ORAL | Status: DC
  Administered 2018-07-28 – 2018-07-29 (×3): 1 via ORAL
  Filled 2018-07-28 (×3): qty 1

## 2018-07-28 MED ORDER — SODIUM CHLORIDE 0.9% IV SOLUTION
Freq: Once | INTRAVENOUS | Status: AC
  Administered 2018-07-28: 09:00:00 via INTRAVENOUS

## 2018-07-28 MED ORDER — FUROSEMIDE 10 MG/ML IJ SOLN
20.0000 mg | Freq: Once | INTRAMUSCULAR | Status: AC
  Administered 2018-07-28: 20 mg via INTRAVENOUS
  Filled 2018-07-28: qty 2

## 2018-07-28 NOTE — Evaluation (Signed)
Physical Therapy Evaluation Patient Details Name: Andrew Joyce MRN: [id number] DOB: 09/07/1960 Today's Date: 07/28/2018   History of Present Illness  Pt is a 58 y.o. male with h/o stage IV bladder cancer undergoing palliative chemotherapy, admitted 07/24/18 with increased weakness and lethargy; worked up for septic shock and AKI, required pressors. Other PMH includes bilateral nephrostomies, CAD, orthostatic hypotension, DM2.    Clinical Impression  Pt presents with an overall decrease in functional mobility secondary to above. PTA, pt independent and lives with supportive family. Today, pt ambulatory at supervision-level. Pt would benefit from continued acute PT services to maximize functional mobility and independence prior to d/c home.     Follow Up Recommendations (Home with hospice)    Equipment Recommendations  None recommended by PT    Recommendations for Other Services       Precautions / Restrictions Precautions Precautions: Fall Precaution Comments: Bilateral nephrostomies Restrictions Weight Bearing Restrictions: No      Mobility  Bed Mobility Overal bed mobility: Needs Assistance Bed Mobility: Supine to Sit     Supine to sit: Min assist     General bed mobility comments: MinA for UE support to assist trunk elevation  Transfers Overall transfer level: Needs assistance Equipment used: None Transfers: Sit to/from Stand Sit to Stand: Supervision            Ambulation/Gait Ambulation/Gait assistance: Supervision Gait Distance (Feet): 150 Feet Assistive device: IV Pole Gait Pattern/deviations: Step-through pattern;Decreased stride length Gait velocity: Decreased   General Gait Details: Slow, guarded gait with IV pole; supervision for safety. No overt LOB or instability  Stairs Stairs: (Declined; requests stair training next session)          Wheelchair Mobility    Modified Rankin (Stroke Patients Only)       Balance Overall  balance assessment: Needs assistance   Sitting balance-Leahy Scale: Fair       Standing balance-Leahy Scale: Fair                               Pertinent Vitals/Pain Pain Assessment: Faces Faces Pain Scale: Hurts a little bit Pain Location: Pelvic area Pain Descriptors / Indicators: Guarding Pain Intervention(s): Monitored during session    Home Living Family/patient expects to be discharged to:: Private residence Living Arrangements: Spouse/significant other;Children Available Help at Discharge: Family;Available 24 hours/day Type of Home: House       Home Layout: Two level;Bed/bath upstairs Home Equipment: None      Prior Function Level of Independence: Independent         Comments: Has had bilateral nephrostomies for ~1 year     Hand Dominance        Extremity/Trunk Assessment   Upper Extremity Assessment Upper Extremity Assessment: Generalized weakness    Lower Extremity Assessment Lower Extremity Assessment: Generalized weakness       Communication   Communication: No difficulties  Cognition Arousal/Alertness: Awake/alert Behavior During Therapy: Flat affect Overall Cognitive Status: Within Functional Limits for tasks assessed                                        General Comments      Exercises     Assessment/Plan    PT Assessment Patient needs continued PT services  PT Problem List Decreased strength;Decreased activity tolerance;Decreased balance;Decreased mobility  PT Treatment Interventions DME instruction;Gait training;Stair training;Functional mobility training;Therapeutic activities;Therapeutic exercise;Balance training;Patient/family education    PT Goals (Current goals can be found in the Care Plan section)  Acute Rehab PT Goals Patient Stated Goal: Return home Thursday PT Goal Formulation: With patient Time For Goal Achievement: 08/11/18 Potential to Achieve Goals: Good    Frequency Min  3X/week   Barriers to discharge        Co-evaluation               AM-PAC PT "6 Clicks" Mobility  Outcome Measure Help needed turning from your back to your side while in a flat bed without using bedrails?: A Little Help needed moving from lying on your back to sitting on the side of a flat bed without using bedrails?: A Little Help needed moving to and from a bed to a chair (including a wheelchair)?: A Little Help needed standing up from a chair using your arms (e.g., wheelchair or bedside chair)?: A Little Help needed to walk in hospital room?: A Little Help needed climbing 3-5 steps with a railing? : A Little 6 Click Score: 18    End of Session     Patient left: in chair;with call bell/phone within reach;with chair alarm set Nurse Communication: Mobility status PT Visit Diagnosis: Other abnormalities of gait and mobility (R26.89);Muscle weakness (generalized) (M62.81)    Time: 1100-1117 PT Time Calculation (min) (ACUTE ONLY): 17 min   Charges:   PT Evaluation $PT Eval Moderate Complexity: [place], PT, DPT Acute Rehabilitation Services  Pager [phone] Office [place] 07/28/2018, 11:33 AM

## 2018-07-28 NOTE — Progress Notes (Signed)
[facility]:  [employer]  TC to the pt in the room and to his wife at home. Discussion had about Hospice care and full comfort approach and also about our home based Palliative care with [facility] ([facility]). The pt has [facility] ACO and is eligible for this program as well.   Nathanyel states he is unsure what he wants at this time. He is considering more chemo therapy if it is an option. We discussed him having the [facility] program with a nurse and SW-- to focus on palliative care while continuing these treatments.  We also discussed the option of hospice care if there isn't a chemo therapy options for him. He is planning to have a face to face conversation with his wife after discharge at home Wednesday night and has agreed for [place] to follow up with them at home on Thursday morning.   Please call us with any questions and thank you for allowing [place] to be apart of the pt's care. Webb Silversmith RN [phone]

## 2018-07-28 NOTE — TOC Progression Note (Signed)
Transition of Care [facility]) - Progression Note    Patient Details  Name: Andrew Joyce MRN: [id number] Date of Birth: March 19, 1960  Transition of Care [facility]) CM/SW Contact  Jacalyn Lefevre Edson [place]ball, RN Phone Number: 07/28/2018, 8:06 AM  Clinical Narrative:     Referral for OP Palliative given to Cheri with [facility].  Expected Discharge Plan: [place]    Expected Discharge Plan and Services Expected Discharge Plan: [place] Acute Care Choice: Hospice Living arrangements for the past 2 months: Single Family Home Expected Discharge Date: 07/29/18               DME Arranged: N/A         HH Arranged: NA           Social Determinants of Health (SDOH) Interventions    Readmission Risk Interventions No flowsheet data found.

## 2018-07-28 NOTE — Progress Notes (Signed)
Events in the last few days noted.  Andrew Joyce known to me with history of advanced bladder cancer with metastatic disease currently receiving palliative chemotherapy.  He was hospitalized for sepsis and required pressors.  Source of his infection appears to be urinary in nature.  He is recovering reasonably well at this time but his prognosis remains guarded.  CT scan on 07/24/2018 was personally reviewed and discussed with him today.  The rationale for continuing salvage therapy versus transitioning to hospice was reviewed.  He is in favor of continuing aggressive measures at this time.  His performance status remains reasonable and I think it will be appropriate to continue systemic therapy based on his wishes.  I offered to discuss this with his wife as well but he opted to discuss that with her instead.  For the time being, I recommended continue supportive management and will address further oncology treatment as an outpatient.  I am in favor however of changing his CODE STATUS regardless of his decision to pursue chemotherapy or not.  Input from palliative medicine will be reasonable as well.  We will continue to follow his progress while he is hospitalized.

## 2018-07-28 NOTE — Progress Notes (Signed)
Palliative Medicine RN Note: Consult order noted.  Cheri with [facility] met with Andrew Joyce this morning and has made a plan to meet at his home Thursday am. Oncology NP Kristen Curcio also spoke with PMT NP Nikki Cousar; she indicates that GOC have been discussed extensively and that there is a treatment plan in place for after d/c.  Discussed this patient with both PMT NP Nikki and PMT MD Dr Anwar. We have nothing to offer Andrew Joyce as GOC are clear for now, and there is a plan to continue these conversations as an outpatient.   While we will continue to monitor his chart until d/c, we will not engage Andrew Joyce unless a new, specific need arises.  Melanie G. Henslee, RN, BSN, CHPN Palliative Medicine Team 07/28/2018 1:22 PM Office [phone] 

## 2018-07-28 NOTE — Plan of Care (Signed)
°  Problem: Education: °Goal: Knowledge of General Education information will improve °Description: Including pain rating scale, medication(s)/side effects and non-pharmacologic comfort measures °Outcome: Progressing °  °Problem: Clinical Measurements: °Goal: Ability to maintain clinical measurements within normal limits will improve °Outcome: Progressing °  °Problem: Nutrition: °Goal: Adequate nutrition will be maintained °Outcome: Progressing °  °

## 2018-07-28 NOTE — Progress Notes (Addendum)
HEMATOLOGY-ONCOLOGY PROGRESS NOTE  SUBJECTIVE: The patient states that he is feeling better.  He has been able to walk in the hallway and has been sitting up in the recliner.  He denies fevers and chills.  Denies chest pain or shortness of breath.  Denies abdominal pain, nausea, vomiting.  Denies bleeding.  Nephrostomy tubes draining without difficulty.  Received 1 unit packed red blood cells earlier today.  Oncology History  Malignant neoplasm of urinary bladder ([place])  09/04/2017 Initial Diagnosis   Malignant neoplasm of urinary bladder ([place])   09/16/2017 - 11/17/2017 Chemotherapy   The patient had palonosetron (ALOXI) injection 0.25 mg, 0.25 mg, Intravenous,  Once, 3 of 4 cycles Administration: 0.25 mg (09/16/2017), 0.25 mg (10/07/2017), 0.25 mg (10/28/2017) CARBOplatin (PARAPLATIN) 280 mg in sodium chloride 0.9 % 250 mL chemo infusion, 280 mg (100 % of original dose 275 mg), Intravenous,  Once, 3 of 4 cycles Dose modification:   (original dose 275 mg, Cycle 1), 275 mg (original dose 275 mg, Cycle 1), 250 mg (original dose 275 mg, Cycle 2, Reason: Provider Judgment),   (original dose 275 mg, Cycle 3, Reason: Provider Judgment) Administration: 280 mg (09/16/2017), 250 mg (10/07/2017), 320 mg (10/28/2017) gemcitabine (GEMZAR) 1,900 mg in sodium chloride 0.9 % 250 mL chemo infusion, 1,000 mg/m2 = 1,900 mg, Intravenous,  Once, 3 of 4 cycles Administration: 1,900 mg (09/16/2017), 1,900 mg (09/23/2017), 1,900 mg (10/07/2017), 1,900 mg (10/14/2017), 1,900 mg (10/28/2017), 1,900 mg (11/04/2017) fosaprepitant (EMEND) 150 mg, dexamethasone (DECADRON) 12 mg in sodium chloride 0.9 % 145 mL IVPB, , Intravenous,  Once, 3 of 4 cycles Administration:  (09/16/2017),  (10/07/2017),  (10/28/2017)  for chemotherapy treatment.    04/23/2018 - 07/16/2018 Chemotherapy   The patient had pembrolizumab (KEYTRUDA) 200 mg in sodium chloride 0.9 % 50 mL chemo infusion, 200 mg, Intravenous, Once, 4 of 6 cycles Administration: 200 mg  (04/23/2018), 200 mg (05/15/2018), 200 mg (06/05/2018), 200 mg (06/26/2018)  for chemotherapy treatment.    07/24/2018 -  Chemotherapy   The patient had palonosetron (ALOXI) injection 0.25 mg, 0.25 mg, Intravenous,  Once, 0 of 4 cycles enfortumab vedotin-ejfv (PADCEV) 76 mg in sodium chloride 0.9 % 50 mL (1.3194 mg/mL) chemo infusion, 1.25 mg/kg = 76 mg, Intravenous,  Once, 0 of 4 cycles  for chemotherapy treatment.       REVIEW OF SYSTEMS:   Constitutional: Denies fevers, chills  Respiratory: Denies cough, dyspnea or wheezes Cardiovascular: Denies palpitation, chest discomfort Gastrointestinal:  Denies nausea, heartburn or change in bowel habits Skin: Fungating bladder mass Lymphatics: Denies new lymphadenopathy or easy bruising Neurological:Denies numbness, tingling or new weaknesses Behavioral/Psych: Mood is stable, no new changes  Extremities: No lower extremity edema All other systems were reviewed with the patient and are negative.  I have reviewed the past medical history, past surgical history, social history and family history with the patient and they are unchanged from previous note.   PHYSICAL EXAMINATION: ECOG PERFORMANCE STATUS: 1 - Symptomatic but completely ambulatory  Vitals:   07/28/18 0845 07/28/18 0900  BP: 127/72 122/76  Pulse: 73 79  Resp: 20 18  Temp: 98.4 F (36.9 C) 98.5 F (36.9 C)  SpO2: 99% 100%   Filed Weights   07/25/18 0451 07/26/18 0431 07/28/18 0500  Weight: 131 lb 9.8 oz (59.7 kg) 135 lb 9.3 oz (61.5 kg) 135 lb 12.9 oz (61.6 kg)    Intake/Output from previous day: 07/20 0701 - 07/21 0700 In: 2878 [P.O.:420; I.V.:350; Blood:315] Out: 6767 [Urine:1825]  GENERAL:alert, no  distress and comfortable SKIN: Dressing in place to his fungating bladder mass. LUNGS: clear to auscultation and percussion with normal breathing effort HEART: regular rate & rhythm and no murmurs and no lower extremity edema ABDOMEN:abdomen soft, non-tender and normal  bowel sounds Musculoskeletal:no cyanosis of digits and no clubbing  NEURO: alert & oriented x 3 with fluent speech, no focal motor/sensory deficits  LABORATORY DATA:  I have reviewed the data as listed CMP Latest Ref Rng & Units 07/28/2018 07/27/2018 07/26/2018  Glucose 70 - 99 mg/dL 143(H) 149(H) 129(H)  BUN 6 - 20 mg/dL 24(H) 19 18  Creatinine 0.61 - 1.24 mg/dL 1.35(H) 1.25(H) 1.47(H)  Sodium 135 - 145 mmol/L 137 134(L) 137  Potassium 3.5 - 5.1 mmol/L 3.5 3.5 4.0  Chloride 98 - 111 mmol/L 104 102 105  CO2 22 - 32 mmol/L 26 23 23   Calcium 8.9 - 10.3 mg/dL 8.4(L) 8.5(L) 8.4(L)  Total Protein 6.5 - 8.1 g/dL - - -  Total Bilirubin 0.3 - 1.2 mg/dL - - -  Alkaline Phos 38 - 126 U/L - - -  AST 15 - 41 U/L - - -  ALT 0 - 44 U/L - - -    Lab Results  Component Value Date   WBC 11.4 (H) 07/28/2018   HGB 9.6 (L) 07/28/2018   HCT 29.5 (L) 07/28/2018   MCV 90.2 07/28/2018   PLT 300 07/28/2018   NEUTROABS 10.1 (H) 07/28/2018    Ct Abdomen Pelvis Wo Contrast  Result Date: 07/24/2018 CLINICAL DATA:  Sepsis.  Question intra-abdominal abscess. EXAM: CT ABDOMEN AND PELVIS WITHOUT CONTRAST TECHNIQUE: Multidetector CT imaging of the abdomen and pelvis was performed following the standard protocol without IV contrast. COMPARISON:  CT abdomen and pelvis 07/17/2018 and 03/21/2018. FINDINGS: Lower chest: Calcific aortic and coronary atherosclerosis. Heart size normal. Mild dependent atelectasis. Hepatobiliary: The liver appears normal. The gallbladder is decompressed. Walls of the gallbladder are thickened, new since the prior exam. Biliary tree unremarkable. Pancreas: Unremarkable. No pancreatic ductal dilatation or surrounding inflammatory changes. Spleen: Normal in size without focal abnormality. Adrenals/Urinary Tract: Bilateral nephrostomy tubes are in place. Gas in the renal collecting systems related to the tubes noted. Right renal atrophy is seen. Cyst off the upper pole of the right kidney is  unchanged. No hydronephrosis. Bladder exstrophy is again seen. The patient's bladder mass is difficult to discretely visualize on this noncontrast examination but the appearance is unchanged since the most recent exam. Locules of gas are again seen. The adrenal glands appear normal. Stomach/Bowel: Stomach is within normal limits. Appendix appears normal. No evidence of bowel wall thickening, distention, or inflammatory changes. Vascular/Lymphatic: 1.8 cm left inguinal lymph node on image 90 of series 3 is unchanged. Right inguinal lymph node measuring 1.8 cm on image 686 series 3 is also unchanged. 2.1 cm right external iliac lymph node on image 82 is unchanged based on remeasurement. Reproductive: Unremarkable. Other: None. Musculoskeletal: No focal lesion. IMPRESSION: No abscess is identified. The gallbladder is decompressed with wall thickening which is new since the most recent CT scan. Right upper quadrant ultrasound could be used for further evaluation. Bladder exstrophy with a large bladder mass as seen on the prior examination. Locules of gas in the bladder are chronic. No change in lymphadenopathy since the most recent exam likely due to metastatic disease. Electronically Signed   By: Inge Rise M.D.   On: 07/24/2018 12:33   Ct Abdomen Pelvis Wo Contrast  Result Date: 07/17/2018 CLINICAL DATA:  58 year old male  with history of bladder cancer diagnosed in June 2019 status post surgical resection and radiation therapy (completed in February 2020) as well as chemotherapy (completed in December 2019). Followup study. EXAM: CT CHEST, ABDOMEN AND PELVIS WITHOUT CONTRAST TECHNIQUE: Multidetector CT imaging of the chest, abdomen and pelvis was performed following the standard protocol without IV contrast. COMPARISON:  CT the abdomen and pelvis 04/21/2018. FINDINGS: CT CHEST FINDINGS Cardiovascular: Heart size is normal. There is no significant pericardial fluid, thickening or pericardial calcification.  There is aortic atherosclerosis, as well as atherosclerosis of the great vessels of the mediastinum and the coronary arteries, including calcified atherosclerotic plaque in the left anterior descending, left circumflex and right coronary arteries. Status post median sternotomy for CABG including LIMA to the LAD. Right internal jugular single-lumen porta cath with tip terminating in the right atrium. Mediastinum/Nodes: No pathologically enlarged mediastinal or hilar lymph nodes. Please note that accurate exclusion of hilar adenopathy is limited on noncontrast CT scans. Esophagus is unremarkable in appearance. No axillary lymphadenopathy. Lungs/Pleura: 3 mm left upper lobe nodule (axial image 30 of series 4). 2 mm left upper lobe nodule (axial image 66 of series 4). No other larger more suspicious appearing pulmonary nodules or masses are noted. Several very subtle areas of peribronchovascular ground-glass attenuation are noted throughout the right upper and right lower lobes. No frank consolidative airspace disease. No pleural effusions. Musculoskeletal: Median sternotomy wires. There are no aggressive appearing lytic or blastic lesions noted in the visualized portions of the skeleton. CT ABDOMEN PELVIS FINDINGS Hepatobiliary: No definite suspicious cystic or solid hepatic lesions are confidently identified on today's noncontrast CT examination. Unenhanced appearance of the gallbladder is normal. Pancreas: No definite pancreatic mass or peripancreatic fluid collections or inflammatory changes. Spleen: Unremarkable. Adrenals/Urinary Tract: Percutaneous nephrostomy tubes bilaterally. Gas in the collecting systems of both kidneys is iatrogenic. Atrophy of the right kidney. Low-attenuation lesions in the right kidney, incompletely characterized on today's non-contrast CT examination, but statistically likely to represent cysts, measuring up to 2.7 cm. 9 mm calcification in the lower pole collecting system of left kidney,  multiple nonobstructive calculi are noted within the collecting system of the left kidney, measuring up to 7 mm in the lower pole. No additional calculi are noted along the course of either ureter. Bladder exstrophy. Known bladder mass poorly evaluated on today's noncontrast CT examination, but estimated to measure approximately 6.3 x 5.4 x 6.7 cm (axial image 118 of series 2 and coronal image 59 of series 5). Small amount of gas in the urinary bladder. Stomach/Bowel: Unenhanced appearance of the stomach is normal. No pathologic dilatation of small bowel or colon. Normal appendix. Vascular/Lymphatic: Aortic atherosclerosis. Enlarging inguinal lymph nodes bilaterally measuring up to 18 mm in short axis on both sides (axial image 118 of series 2 on the right and axial image 123 of series 2 on the left). Enlarged right external iliac lymph node measuring 17 mm in short axis (axial image 114 of series 2). Enlarged left external iliac node (axial image 104 of series 2) measuring 12 mm in short axis. Multiple prominent borderline enlarged retroperitoneal lymph nodes, nonspecific but suspicious. Reproductive: Dystrophic calcifications in the prostate gland. Other: No significant volume of ascites.  No pneumoperitoneum. Musculoskeletal: There are no aggressive appearing lytic or blastic lesions noted in the visualized portions of the skeleton. IMPRESSION: 1. Bladder exstrophy with enlarging bladder mass and worsening pelvic lymphadenopathy, as detailed above, indicative of progressive disease. 2. Patchy areas of very mild peribronchovascular ground-glass attenuation in the  right upper and lower lobes, nonspecific, but likely of infectious or inflammatory etiology. 3. Tiny pulmonary nodules in the left lung measuring 2-3 mm in size. These are nonspecific, but statistically likely benign. Attention at time of routine follow-up imaging is recommended to exclude the possibility of metastatic disease to the lungs. 4. Aortic  atherosclerosis, in addition to three-vessel coronary artery disease. Status post median sternotomy for CABG including LIMA to the LAD. 5. Additional incidental findings, as above. Electronically Signed   By: Vinnie Langton M.D.   On: 07/17/2018 12:50   Ct Head Wo Contrast  Result Date: 07/24/2018 CLINICAL DATA:  Fall.  History of bladder cancer. EXAM: CT HEAD WITHOUT CONTRAST CT CERVICAL SPINE WITHOUT CONTRAST TECHNIQUE: Multidetector CT imaging of the head and cervical spine was performed following the standard protocol without intravenous contrast. Multiplanar CT image reconstructions of the cervical spine were also generated. COMPARISON:  None. FINDINGS: CT HEAD FINDINGS Brain: No evidence of acute infarction, hemorrhage, hydrocephalus, extra-axial collection or mass lesion/mass effect. Vascular: No hyperdense vessel or unexpected calcification. Skull: Normal. Negative for fracture or focal lesion. Sinuses/Orbits: No acute finding. Other: None. CT CERVICAL SPINE FINDINGS Alignment: No traumatic malalignment. Skull base and vertebrae: No acute fracture. No primary bone lesion or focal pathologic process. Congenital C5-C6 fusion. Soft tissues and spinal canal: No prevertebral fluid or swelling. No visible canal hematoma. Disc levels: Moderate disc height loss with posterior disc osteophyte complex and right greater than left uncovertebral hypertrophy at C3-C4. Mild-to-moderate spinal canal stenosis. Severe right neuroforaminal stenosis. Mild disc height loss with left paracentral and medial foraminal disc protrusion at C6-C7. Moderate left uncovertebral hypertrophy. Moderate left neuroforaminal stenosis. Upper chest: New mild interlobular septal thickening in the lung apices. Other: Scattered small foci of intravascular air in the neck. IMPRESSION: 1.  No acute intracranial abnormality. 2.  No acute cervical spine fracture. 3. C3-C4 and C6-C7 spondylosis as described above. 4. New mild interlobular septal  thickening in the lung apices, consistent with interstitial pulmonary edema. Electronically Signed   By: Titus Dubin M.D.   On: 07/24/2018 12:28   Ct Chest Wo Contrast  Result Date: 07/17/2018 CLINICAL DATA:  58 year old male with history of bladder cancer diagnosed in June 2019 status post surgical resection and radiation therapy (completed in February 2020) as well as chemotherapy (completed in December 2019). Followup study. EXAM: CT CHEST, ABDOMEN AND PELVIS WITHOUT CONTRAST TECHNIQUE: Multidetector CT imaging of the chest, abdomen and pelvis was performed following the standard protocol without IV contrast. COMPARISON:  CT the abdomen and pelvis 04/21/2018. FINDINGS: CT CHEST FINDINGS Cardiovascular: Heart size is normal. There is no significant pericardial fluid, thickening or pericardial calcification. There is aortic atherosclerosis, as well as atherosclerosis of the great vessels of the mediastinum and the coronary arteries, including calcified atherosclerotic plaque in the left anterior descending, left circumflex and right coronary arteries. Status post median sternotomy for CABG including LIMA to the LAD. Right internal jugular single-lumen porta cath with tip terminating in the right atrium. Mediastinum/Nodes: No pathologically enlarged mediastinal or hilar lymph nodes. Please note that accurate exclusion of hilar adenopathy is limited on noncontrast CT scans. Esophagus is unremarkable in appearance. No axillary lymphadenopathy. Lungs/Pleura: 3 mm left upper lobe nodule (axial image 30 of series 4). 2 mm left upper lobe nodule (axial image 66 of series 4). No other larger more suspicious appearing pulmonary nodules or masses are noted. Several very subtle areas of peribronchovascular ground-glass attenuation are noted throughout the right upper and right lower  lobes. No frank consolidative airspace disease. No pleural effusions. Musculoskeletal: Median sternotomy wires. There are no aggressive  appearing lytic or blastic lesions noted in the visualized portions of the skeleton. CT ABDOMEN PELVIS FINDINGS Hepatobiliary: No definite suspicious cystic or solid hepatic lesions are confidently identified on today's noncontrast CT examination. Unenhanced appearance of the gallbladder is normal. Pancreas: No definite pancreatic mass or peripancreatic fluid collections or inflammatory changes. Spleen: Unremarkable. Adrenals/Urinary Tract: Percutaneous nephrostomy tubes bilaterally. Gas in the collecting systems of both kidneys is iatrogenic. Atrophy of the right kidney. Low-attenuation lesions in the right kidney, incompletely characterized on today's non-contrast CT examination, but statistically likely to represent cysts, measuring up to 2.7 cm. 9 mm calcification in the lower pole collecting system of left kidney, multiple nonobstructive calculi are noted within the collecting system of the left kidney, measuring up to 7 mm in the lower pole. No additional calculi are noted along the course of either ureter. Bladder exstrophy. Known bladder mass poorly evaluated on today's noncontrast CT examination, but estimated to measure approximately 6.3 x 5.4 x 6.7 cm (axial image 118 of series 2 and coronal image 59 of series 5). Small amount of gas in the urinary bladder. Stomach/Bowel: Unenhanced appearance of the stomach is normal. No pathologic dilatation of small bowel or colon. Normal appendix. Vascular/Lymphatic: Aortic atherosclerosis. Enlarging inguinal lymph nodes bilaterally measuring up to 18 mm in short axis on both sides (axial image 118 of series 2 on the right and axial image 123 of series 2 on the left). Enlarged right external iliac lymph node measuring 17 mm in short axis (axial image 114 of series 2). Enlarged left external iliac node (axial image 104 of series 2) measuring 12 mm in short axis. Multiple prominent borderline enlarged retroperitoneal lymph nodes, nonspecific but suspicious.  Reproductive: Dystrophic calcifications in the prostate gland. Other: No significant volume of ascites.  No pneumoperitoneum. Musculoskeletal: There are no aggressive appearing lytic or blastic lesions noted in the visualized portions of the skeleton. IMPRESSION: 1. Bladder exstrophy with enlarging bladder mass and worsening pelvic lymphadenopathy, as detailed above, indicative of progressive disease. 2. Patchy areas of very mild peribronchovascular ground-glass attenuation in the right upper and lower lobes, nonspecific, but likely of infectious or inflammatory etiology. 3. Tiny pulmonary nodules in the left lung measuring 2-3 mm in size. These are nonspecific, but statistically likely benign. Attention at time of routine follow-up imaging is recommended to exclude the possibility of metastatic disease to the lungs. 4. Aortic atherosclerosis, in addition to three-vessel coronary artery disease. Status post median sternotomy for CABG including LIMA to the LAD. 5. Additional incidental findings, as above. Electronically Signed   By: Vinnie Langton M.D.   On: 07/17/2018 12:50   Ct Cervical Spine Wo Contrast  Result Date: 07/24/2018 CLINICAL DATA:  Fall.  History of bladder cancer. EXAM: CT HEAD WITHOUT CONTRAST CT CERVICAL SPINE WITHOUT CONTRAST TECHNIQUE: Multidetector CT imaging of the head and cervical spine was performed following the standard protocol without intravenous contrast. Multiplanar CT image reconstructions of the cervical spine were also generated. COMPARISON:  None. FINDINGS: CT HEAD FINDINGS Brain: No evidence of acute infarction, hemorrhage, hydrocephalus, extra-axial collection or mass lesion/mass effect. Vascular: No hyperdense vessel or unexpected calcification. Skull: Normal. Negative for fracture or focal lesion. Sinuses/Orbits: No acute finding. Other: None. CT CERVICAL SPINE FINDINGS Alignment: No traumatic malalignment. Skull base and vertebrae: No acute fracture. No primary bone  lesion or focal pathologic process. Congenital C5-C6 fusion. Soft tissues and spinal canal:  No prevertebral fluid or swelling. No visible canal hematoma. Disc levels: Moderate disc height loss with posterior disc osteophyte complex and right greater than left uncovertebral hypertrophy at C3-C4. Mild-to-moderate spinal canal stenosis. Severe right neuroforaminal stenosis. Mild disc height loss with left paracentral and medial foraminal disc protrusion at C6-C7. Moderate left uncovertebral hypertrophy. Moderate left neuroforaminal stenosis. Upper chest: New mild interlobular septal thickening in the lung apices. Other: Scattered small foci of intravascular air in the neck. IMPRESSION: 1.  No acute intracranial abnormality. 2.  No acute cervical spine fracture. 3. C3-C4 and C6-C7 spondylosis as described above. 4. New mild interlobular septal thickening in the lung apices, consistent with interstitial pulmonary edema. Electronically Signed   By: Titus Dubin M.D.   On: 07/24/2018 12:28   Dg Chest Port 1 View  Result Date: 07/24/2018 CLINICAL DATA:  Coming from home. Wife called EMS for a fall in the bathroom pt could not get up unassisted. Bladder cancer patient, due for chemo today. EXAM: PORTABLE CHEST 1 VIEW COMPARISON:  07/17/2018, chest CT. FINDINGS: Stable changes from prior cardiac surgery. Cardiac silhouette is normal in size and configuration. No mediastinal or hilar masses or evidence of adenopathy. Clear lungs.  No pleural effusion or pneumothorax. Skeletal structures are grossly intact. Stable right anterior chest wall Port-A-Cath, tip at the caval atrial junction. IMPRESSION: No acute cardiopulmonary disease. Electronically Signed   By: Lajean Manes M.D.   On: 07/24/2018 10:30   Ir Nephrostomy Exchange Left  Result Date: 06/30/2018 INDICATION: 58 year old male with chronic bilateral indwelling percutaneous nephrostomy tubes. Unfortunately, he has experienced multiple episodes of recurrent early  occlusion of the left-sided tube due to sediment and mucous deposition. He presents today with decreased tube output and left flank pain. He has no issues with the right-sided tube and would like to defer exchange of that tube at this time. EXAM: Nephrostomy tube exchange COMPARISON:  Most recent prior nephrostomy tube exchange 06/09/2018 MEDICATIONS: None ANESTHESIA/SEDATION: None CONTRAST:  10 mL Omnipaque 300-administered into the collecting system(s) FLUOROSCOPY TIME:  Fluoroscopy Time: 1 minutes 54 seconds (14 mGy). COMPLICATIONS: None immediate. PROCEDURE: Informed written consent was obtained from the patient after a thorough discussion of the procedural risks, benefits and alternatives. All questions were addressed. Maximal Sterile Barrier Technique was utilized including caps, mask, sterile gowns, sterile gloves, sterile drape, hand hygiene and skin antiseptic. A timeout was performed prior to the initiation of the procedure. Contrast was injected through the existing catheter. The catheter remains well positioned within the renal pelvis. The catheter was transected and removed with difficulty over a stiff Glidewire. The catheter was inspected and found to have significant sediment deposition obstructing the majority of the sideholes. Additionally, the outside of the catheter was could in a mucous like substance. The decision was made to proceed with further up sizing to 14 [place]. The 14 French catheter was further modified by slightly enlarging several of the sideholes. The catheter was carefully advanced over the wire and into the renal pelvis where it was successfully formed. A gentle contrast injection confirms that the catheter is well positioned in the renal pelvis. The catheter was flushed and connected to gravity bag drainage. The catheter was then secured to the skin with 0 Prolene suture. IMPRESSION: Successful exchange and up size to a now 73 French percutaneous nephrostomy tube on the left.  Additionally, the sideholes were slightly modified and enlarged. Electronically Signed   By: Jacqulynn Cadet M.D.   On: 06/30/2018 14:58    ASSESSMENT  AND PLAN: 58 year old man with:  1.  Bladder cancer diagnosed in 2019 with currently stage IV high-grade urothelial carcinoma with involvement of pelvic lymph nodes.  He has been receiving pembrolizumab without any major complications.  Discussed continuing salvage therapy versus transitioning to hospice.  The patient has indicated that he would like to continue aggressive treatment at this time if he is a candidate.  I note that [facility] [place] has been in contact with the patient and his wife.  They plan to have ongoing discussions with them regarding palliative care services in the home.  The patient states that he is not interested in pursuing hospice at this time.  2.  Urosepsis: Multiple organisms growing including Proteus Morganella.  Transitioning from ceftriaxone to Bactrim which is due to complete on 08/07/2018.  3. IV access: Port-A-Cath remains in place without any issues.  4. Antiemetics: No nausea or vomiting reported at this time.  Antiemetics are available to him.  5. Renal insufficiency: Due to malignancy.  Nephrostomy tube remains in place which will be replaced periodically.    In order has has been placed for nephrostomy tube change during this admission.  6. Goals of care: His disease is incurable although aggressive therapy is warranted given his excellent performance status.  He continues to desire aggressive treatment for his cancer.  7.  Anemia:  His anemia is related to renal sufficiency as well as malignancy.  He has received 3 units of packed red blood cells this admission.   Hemoglobin currently 9.6.  8.  Pain: Pain control with oxycodone.  9.  Anticipate discharge in the next 1 to 2 days.  We will arrange for outpatient follow-up for discussion regarding further treatment for his cancer.    LOS: 4 days   Mikey Bussing, DNP, AGPCNP-BC, AOCNP 07/28/18

## 2018-07-28 NOTE — Progress Notes (Addendum)
PROGRESS NOTE  Andrew Joyce JJO:[id number] DOB: June 26, 1960 DOA: 07/24/2018 PCP: [name], NP  Brief History   58 year old male Urothelial CA-TURBT 01/23/2017+ gemcitabine/cisplatin, XRT finishing 03/03/2018-salvage therapy candidate-goals of care is palliative Status post PCN tubes with multiple dysfunction-prior vesicle abdominal fistula with prior XRT CAD status post CABG 2008-NSTEMI 02/2011 (no targets) reported class I angina Orthostatic hypotension Severe hypertriglyceridemia  Also has congenital bladder exstrophy-urinary diversion bilateral nephrostomies admitted with sepsis unclear cause   Admitted by critical care medicine secondary to septic shock, acute kidney injury on admission, lactic acidosis-required fluid resuscitation, IV pressors Urology saw the patient did not think debridement of the mass would add much  Transferred to hospitalist service 7/20 a.m.     A & P  Complicated urinary infection cause for sepsis Bilateral nephrostomies Cutaneous bladder fistula and epididymoorchitis Multiple organisms growing, Proteus, Morganella--- given immunocompromised state would treat for total of 14 days-transitioning from IV ceftriaxone-->Bactrim DS end date 7/31--- this would offer some skin coverage in addition Anemia of blood loss acute + hemodilution possible Transfused X1 7/20, transfusing again 7/21 Labs a.m. Suspect also mediated by malignancy Get iron studies a.m. 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 Resume l gabapentin bedtime continue sliding scale coverage Depression, situational Resume Cymbalta 20 daily and Tegretol 100 twice daily Severe hypertriglyceridemia Resuming fenofibrate 160 daily, statin  DVT prophylaxis: SCD at this time given low blood count Code Status: Full code confirmed at bedside with the patient Family Communication: Discussed on the phone yesterday in detail with the wife 7/20 Subsequently have discussed in detail with the patient Disposition Plan: Will need palliative care input prior to discharge otherwise at exorbitantly high risk for readmission Transfusing again today Check fever curve may be able to discharge end of the week once goals have been more solidified  Verneita Griffes, MD [facility] 8:55 AM  07/28/2018, 8:55 AM  LOS: 4 days   Consultants  . Urologist . Discussed with Dr. Alen Blew over telephone on 7/20 and appreciate input  Procedures  . Multiple  Antibiotics  . Ceftriaxone and doxycycline-->Bactrim DS  Interval History/Subjective   Sitting in bed about to have breakfast no reported bleeding urostomy is draining well Long discussion at the bedside with him about goals of care and he reemphasizes he wishes to be full code  Objective   Vitals:  Vitals:   07/28/18 0444 07/28/18 0845  BP: 110/90 (P) 127/72  Pulse: 78 (P) 73  Resp: 17 (P) 20  Temp: 98.7 F (37.1 C) (P) 98.4 F (36.9 C)  SpO2: 98% (P) 99%    Exam: Awake coherent pleasant no distress External ocular movements intact Chest wall has a Port-A-Cath in the right chest which seems clean S1-S2 no murmur Abdomen soft    Wounds as above Neurologically intact Frail cachectic    I have personally reviewed the following:   Today's Data  . None  Lab Data   . Hemoglobin back down from 8.22 days ago to currently 7.1-transfused 7/20-->hemoglobin 7.3  transfuse 7/21 . White count down from 19-15.6-->11.4 .  Platelets good 326 . BUN/creatinine stable and improved from admission  Micro Data  7/17 Abnormal  >=100,000 COLONIES/mL PROTEUS PENNERI  >=100,000 COLONIES/mL MORGANELLA MORGANII  >=100,000 COLONIES/mL KOCURIA SPECIES  Blood culture X2 7/17 no growth  Imaging  .   Cardiology Data  . None  Other Data  . None  Scheduled Meds: . baclofen  20 mg Oral TID  . carbamazepine  100 mg Oral BID  . Chlorhexidine Gluconate Cloth  6 each Topical Daily  . doxycycline  100 mg Oral Q12H  . DULoxetine  20 mg Oral Daily  . feeding supplement (ENSURE ENLIVE)  237 mL Oral TID BM  . fenofibrate  160 mg Oral Daily  . furosemide  20 mg Intravenous Once  . gabapentin  300 mg Oral QHS  . insulin aspart  0-9 Units Subcutaneous Q4H  . isosorbide mononitrate  15 mg Oral Daily  . multivitamin with minerals  1 tablet Oral Daily  . pantoprazole (PROTONIX) IV  40 mg Intravenous QHS  . rOPINIRole  0.25 mg Oral QHS  . rosuvastatin  40 mg Oral Daily  . sodium chloride flush  10-40 mL Intracatheter Q12H  . zolpidem  5 mg Oral QHS   Continuous Infusions: . cefTRIAXone (ROCEPHIN)  IV 1 g (07/27/18 1311)    Principal Problem:   Septic shock (HCC) Active Problems:   CAD (coronary artery disease)   HTN (hypertension)   Hyperlipidemia   Diabetes mellitus ([place])   Malignant neoplasm of urinary bladder (HCC)   Type 2 diabetes mellitus (HCC)   Protein-calorie malnutrition, severe   LOS: 4 days   How to contact the [facility] Attending or Consulting provider [place] or covering provider during after hours [place], for this patient?  1. Check the care team in [facility] and look for a) attending/consulting [facility] provider listed and b) the [facility] team listed 2. Log into [email or web address] and use [facility] universal password to access. If you do not have the password, please contact the hospital operator. 3. Locate the [facility] provider you are looking for under [facility] and page to a number that you can be directly reached. 4. If you still have difficulty reaching the provider, please page the [facility] (Director on Call) for the Hospitalists listed on amion for assistance.

## 2018-07-29 ENCOUNTER — Telehealth: Payer: Self-pay | Admitting: Cardiology

## 2018-07-29 LAB — BPAM RBC
Blood Product Expiration Date: 202008172359
Blood Product Expiration Date: 202008172359
ISSUE DATE / TIME: 202007201502
ISSUE DATE / TIME: 202007210835
Unit Type and Rh: 5100
Unit Type and Rh: 5100

## 2018-07-29 LAB — GLUCOSE, CAPILLARY
Glucose-Capillary: 127 mg/dL — ABNORMAL HIGH (ref 70–99)
Glucose-Capillary: 127 mg/dL — ABNORMAL HIGH (ref 70–99)
Glucose-Capillary: 215 mg/dL — ABNORMAL HIGH (ref 70–99)
Glucose-Capillary: 187 mg/dL — ABNORMAL HIGH (ref 70–99)

## 2018-07-29 LAB — CBC WITH DIFFERENTIAL/PLATELET
Abs Immature Granulocytes: 0.09 10*3/uL — ABNORMAL HIGH (ref 0.00–0.07)
Basophils Absolute: 0 10*3/uL (ref 0.0–0.1)
Basophils Relative: 0 %
Eosinophils Absolute: 0.2 10*3/uL (ref 0.0–0.5)
Eosinophils Relative: 2 %
HCT: 26.7 % — ABNORMAL LOW (ref 39.0–52.0)
Hemoglobin: 8.7 g/dL — ABNORMAL LOW (ref 13.0–17.0)
Immature Granulocytes: 1 %
Lymphocytes Relative: 7 %
Lymphs Abs: 0.8 10*3/uL (ref 0.7–4.0)
MCH: 29.2 pg (ref 26.0–34.0)
MCHC: 32.6 g/dL (ref 30.0–36.0)
MCV: 89.6 fL (ref 80.0–100.0)
Monocytes Absolute: 0.6 10*3/uL (ref 0.1–1.0)
Monocytes Relative: 6 %
Neutro Abs: 9.2 10*3/uL — ABNORMAL HIGH (ref 1.7–7.7)
Neutrophils Relative %: 84 %
Platelets: 330 10*3/uL (ref 150–400)
RBC: 2.98 MIL/uL — ABNORMAL LOW (ref 4.22–5.81)
RDW: 15.6 % — ABNORMAL HIGH (ref 11.5–15.5)
WBC: 10.8 10*3/uL — ABNORMAL HIGH (ref 4.0–10.5)
nRBC: 0 % (ref 0.0–0.2)

## 2018-07-29 LAB — TYPE AND SCREEN
ABO/RH(D): O POS
Antibody Screen: NEGATIVE
Unit division: 0
Unit division: 0

## 2018-07-29 LAB — IRON AND TIBC
Iron: 19 ug/dL — ABNORMAL LOW (ref 45–182)
Saturation Ratios: 12 % — ABNORMAL LOW (ref 17.9–39.5)
TIBC: 165 ug/dL — ABNORMAL LOW (ref 250–450)
UIBC: 146 ug/dL

## 2018-07-29 LAB — FERRITIN: Ferritin: 1227 ng/mL — ABNORMAL HIGH (ref 24–336)

## 2018-07-29 LAB — RETICULOCYTES
Immature Retic Fract: 11.9 % (ref 2.3–15.9)
RBC.: 2.98 MIL/uL — ABNORMAL LOW (ref 4.22–5.81)
Retic Count, Absolute: 28 10*3/uL (ref 19.0–186.0)
Retic Ct Pct: 0.9 % (ref 0.4–3.1)

## 2018-07-29 LAB — RENAL FUNCTION PANEL
Albumin: 1.5 g/dL — ABNORMAL LOW (ref 3.5–5.0)
Anion gap: 8 (ref 5–15)
BUN: 23 mg/dL — ABNORMAL HIGH (ref 6–20)
CO2: 27 mmol/L (ref 22–32)
Calcium: 8.4 mg/dL — ABNORMAL LOW (ref 8.9–10.3)
Chloride: 103 mmol/L (ref 98–111)
Creatinine, Ser: 1.24 mg/dL (ref 0.61–1.24)
GFR calc Af Amer: >60 mL/min (ref >60)
GFR calc non Af Amer: >60 mL/min (ref >60)
Glucose, Bld: 145 mg/dL — ABNORMAL HIGH (ref 70–99)
Phosphorus: 1.4 mg/dL — ABNORMAL LOW (ref 2.5–4.6)
Potassium: 3.8 mmol/L (ref 3.5–5.1)
Sodium: 138 mmol/L (ref 135–145)

## 2018-07-29 LAB — CULTURE, BLOOD (ROUTINE X 2)
Culture: NO GROWTH
Culture: NO GROWTH
Special Requests: ADEQUATE

## 2018-07-29 LAB — FOLATE: Folate: 6.2 ng/mL (ref >5.9)

## 2018-07-29 MED ORDER — SULFAMETHOXAZOLE-TRIMETHOPRIM 800-160 MG PO TABS: 1.0000 | ORAL_TABLET | Freq: Two times a day (BID) | ORAL | 0 refills | Status: AC

## 2018-07-29 MED ORDER — HEPARIN SOD (PORK) LOCK FLUSH 100 UNIT/ML IV SOLN
500.0000 [IU] | INTRAVENOUS | Status: AC | PRN
  Administered 2018-07-29: 17:00:00 500 [IU]

## 2018-07-29 MED ORDER — PANTOPRAZOLE SODIUM 40 MG PO TBEC: 40.0000 mg | DELAYED_RELEASE_TABLET | Freq: Every day | ORAL | Status: DC

## 2018-07-29 MED ORDER — SODIUM CHLORIDE 0.9 % IV SOLN
510.0000 mg | Freq: Once | INTRAVENOUS | Status: AC
  Administered 2018-07-29: 09:00:00 510 mg via INTRAVENOUS
  Filled 2018-07-29: qty 17

## 2018-07-29 NOTE — Discharge Summary (Signed)
Physician Discharge Summary  Donovon Micheletti MHD:[id number] DOB: [birth year] DOA: 07/24/2018  PCP: [name], NP  Admit date: 07/24/2018 Discharge date: 07/29/2018  Time spent: 50 minutes  Recommendations for Outpatient Follow-up:  1. Patient will be scheduled for outpatient perk nephrostomy drain replacements by IR 2. I will CC Dr. Alen Blew, Dr. Diona Fanti for coordination of outpatient care 3. Patient is at extremely high risk for readmission, death etc. 4. Suggest iron panel 3 weeks, CBC 1 week, Chem-12 1 week  Discharge Diagnoses:  Principal Problem:   Septic shock ([place]) Active Problems:   CAD (coronary artery disease)   HTN (hypertension)   Hyperlipidemia   Diabetes mellitus ([place])   Malignant neoplasm of urinary bladder ([place])   Type 2 diabetes mellitus ([place])   Protein-calorie malnutrition, severe   Discharge Condition: Guarded  Diet recommendation: Diabetic low-salt diet  Filed Weights   07/26/18 0431 07/28/18 0500 07/29/18 0403  Weight: 61.5 kg 61.6 kg 61.3 kg    History of present illness:  58 year old male Urothelial CA-TURBT 01/23/2017+ gemcitabine/cisplatin, XRT finishing 03/03/2018-salvage therapy candidate-goals of care is palliative Status post PCN tubes with multiple dysfunction-prior vesicle abdominal fistula with prior XRT CAD status post CABG 2008-NSTEMI 02/2011 (no targets) reported class I angina Orthostatic hypotension Severe hypertriglyceridemia  Also has congenital bladder exstrophy-urinary diversion bilateral nephrostomies admitted with sepsis unclear cause  Admitted by critical care medicine secondary to septic shock, acute kidney injury on admission, lactic acidosis-required fluid resuscitation, IV pressors Urology saw the patient did not think debridement of the mass would add much  Transferred to hospitalist service 7/20 a.m.  Hospital Course:  Complicated urinary infection cause for sepsis Bilateral nephrostomies Cutaneous  bladder fistula and epididymoorchitis Multiple organisms growing, Proteus, Morganella--- given immunocompromised state would treat for total of 14 days-transitioning from IV ceftriaxone-->Bactrim DS end date 7/31--- this would offer some skin coverage in addition-he knows how to change his dressings and he will continue the same at home I discussed with interventional radiologist who will schedule for outpatient nephrostomy replacements in the outpatient setting Anemia of blood loss acute + hemodilution possible Transfused X1 7/20, transfusing again 7/21 Suspect also mediated by malignancy Patient was given 1 dose of IV iron secondary to low saturation refills and discharge Discharge hemoglobin was 8.7 Stage IV urothelial cancer Exophytic mass Oncologist suggesting palliative input-recommending DNR status-patient declining the same wishes to pursue full CODE STATUS He will visit Dr. 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 Resume l gabapentin bedtime continue sliding scale coverage On discharge he was resumed on sitagliptin and other usual diabetes meds Depression, situational Resume Cymbalta 20 daily and Tegretol 100 twice daily Severe hypertriglyceridemia Resuming fenofibrate 160 daily, statin-I would argue that as an outpatient we should discontinue some of his preventative medication such as statin  Consultations:  Urology  Oncology  Palliative care  Discharge Exam: Vitals:   07/29/18 0839 07/29/18 1309  BP:  103/72  Pulse:  81  Resp:  20  Temp: 98.8 F (37.1 C) 98.5 F (36.9 C)  SpO2:  99%   Awake  coherent sitting up in the bed about to eat breakfast no distress No fevers today Asking  about nephrostomy tube replacements   General: Coherent flat affect no icterus frail and bitemporal wasting Cardiovascular: W9-U0 holosystolic murmur LLSE Respiratory: Clinically clear no added sound Abdomen not examined today, please see pictures below taken on 7/21 No lower extremity edema        Discharge Instructions   Discharge Instructions    Diet - low sodium heart healthy   Complete by: As directed    Discharge instructions   Complete by: As directed    You will have your tubes exchanged today Complete Bactrim DS as an OP You will need follow wup with Dr. Alen Blew and Dr. Diona Fanti Would recommend labs in 1 week   Increase activity slowly   Complete by: As directed      Allergies as of 07/29/2018   No Known Allergies     Medication List    TAKE these medications   baclofen 10 MG tablet Commonly known as: LIORESAL TAKE 2 TABLETS BY MOUTH THREE TIMES DAILY   carbamazepine 100 MG 12 hr capsule Commonly known as: CARBATROL Take 1 capsule (100 mg total) by mouth 2 (two) times daily.   DULoxetine 20 MG capsule Commonly known as: CYMBALTA Take 20 mg by mouth daily.   fenofibrate 160 MG tablet Take 1 tablet by mouth once daily   gabapentin 300 MG capsule Commonly known as: NEURONTIN Take 300 mg by mouth at bedtime.   glucose blood test strip Commonly known as: Accu-Chek Guide Use to check blood sugars 2 times daily. Dx Code: E11.9   isosorbide mononitrate 60 MG 24 hr tablet Commonly known as: IMDUR TAKE 1 & 1/2 (ONE & ONE-HALF) TABLETS BY MOUTH ONCE DAILY   meloxicam 15 MG tablet Commonly known as: MOBIC Take 1 tablet (15 mg total) by mouth daily.   nitroGLYCERIN 0.4 MG SL tablet Commonly known as: NITROSTAT Place 1 tablet (0.4 mg total) under the tongue every 5 (five) minutes as needed. For chest pain   oxycodone 30 MG immediate release tablet Commonly  known as: ROXICODONE Take 30 mg by mouth every 6 (six) hours as needed (pain).   POTASSIUM CITRATE PO Take 15 mEq by mouth daily. 1620   rOPINIRole 0.25 MG tablet Commonly known as: REQUIP Take 1 tablet (0.25 mg total) by mouth at bedtime. TAKE 1 TABLET BY MOUTH 3 HOURS BEFORE BEDTIME   rosuvastatin 40 MG tablet Commonly known as: CRESTOR Take 1 tablet by mouth once daily   senna-docusate 8.6-50 MG tablet Commonly known as: Senna S Take 1 tablet by mouth daily.   sitaGLIPtin 50 MG tablet Commonly known as: Januvia Take 1 tablet (50 mg total) by mouth daily.   sulfamethoxazole-trimethoprim 800-160 MG tablet Commonly known as: BACTRIM DS Take 1 tablet by mouth every 12 (twelve) hours for 9 days.   triamcinolone ointment 0.5 % Commonly known as: KENALOG Apply 1 application topically 2 (two) times daily.   zolpidem 5 MG tablet Commonly known as: AMBIEN Take 5 mg by mouth at bedtime.      No Known Allergies    The results of significant diagnostics from this hospitalization (including imaging, microbiology, ancillary and laboratory) are listed below for reference.    Significant Diagnostic Studies: Ct Abdomen Pelvis Wo Contrast  Result Date: 07/24/2018 CLINICAL DATA:  Sepsis.  Question intra-abdominal abscess. EXAM: CT ABDOMEN AND PELVIS WITHOUT CONTRAST TECHNIQUE: Multidetector CT imaging of the abdomen and pelvis was performed following the standard protocol without IV contrast. COMPARISON:  CT abdomen and pelvis 07/17/2018 and 03/21/2018. FINDINGS: Lower  chest: Calcific aortic and coronary atherosclerosis. Heart size normal. Mild dependent atelectasis. Hepatobiliary: The liver appears normal. The gallbladder is decompressed. Walls of the gallbladder are thickened, new since the prior exam. Biliary tree unremarkable. Pancreas: Unremarkable. No pancreatic ductal dilatation or surrounding inflammatory changes. Spleen: Normal in size without focal abnormality. Adrenals/Urinary  Tract: Bilateral nephrostomy tubes are in place. Gas in the renal collecting systems related to the tubes noted. Right renal atrophy is seen. Cyst off the upper pole of the right kidney is unchanged. No hydronephrosis. Bladder exstrophy is again seen. The patient's bladder mass is difficult to discretely visualize on this noncontrast examination but the appearance is unchanged since the most recent exam. Locules of gas are again seen. The adrenal glands appear normal. Stomach/Bowel: Stomach is within normal limits. Appendix appears normal. No evidence of bowel wall thickening, distention, or inflammatory changes. Vascular/Lymphatic: 1.8 cm left inguinal lymph node on image 90 of series 3 is unchanged. Right inguinal lymph node measuring 1.8 cm on image 686 series 3 is also unchanged. 2.1 cm right external iliac lymph node on image 82 is unchanged based on remeasurement. Reproductive: Unremarkable. Other: None. Musculoskeletal: No focal lesion. IMPRESSION: No abscess is identified. The gallbladder is decompressed with wall thickening which is new since the most recent CT scan. Right upper quadrant ultrasound could be used for further evaluation. Bladder exstrophy with a large bladder mass as seen on the prior examination. Locules of gas in the bladder are chronic. No change in lymphadenopathy since the most recent exam likely due to metastatic disease. Electronically Signed   By: Inge Rise M.D.   On: 07/24/2018 12:33   Ct Abdomen Pelvis Wo Contrast  Result Date: 07/17/2018 CLINICAL DATA:  58 year old male with history of bladder cancer diagnosed in June 2019 status post surgical resection and radiation therapy (completed in February 2020) as well as chemotherapy (completed in December 2019). Followup study. EXAM: CT CHEST, ABDOMEN AND PELVIS WITHOUT CONTRAST TECHNIQUE: Multidetector CT imaging of the chest, abdomen and pelvis was performed following the standard protocol without IV contrast. COMPARISON:   CT the abdomen and pelvis 04/21/2018. FINDINGS: CT CHEST FINDINGS Cardiovascular: Heart size is normal. There is no significant pericardial fluid, thickening or pericardial calcification. There is aortic atherosclerosis, as well as atherosclerosis of the great vessels of the mediastinum and the coronary arteries, including calcified atherosclerotic plaque in the left anterior descending, left circumflex and right coronary arteries. Status post median sternotomy for CABG including LIMA to the LAD. Right internal jugular single-lumen porta cath with tip terminating in the right atrium. Mediastinum/Nodes: No pathologically enlarged mediastinal or hilar lymph nodes. Please note that accurate exclusion of hilar adenopathy is limited on noncontrast CT scans. Esophagus is unremarkable in appearance. No axillary lymphadenopathy. Lungs/Pleura: 3 mm left upper lobe nodule (axial image 30 of series 4). 2 mm left upper lobe nodule (axial image 66 of series 4). No other larger more suspicious appearing pulmonary nodules or masses are noted. Several very subtle areas of peribronchovascular ground-glass attenuation are noted throughout the right upper and right lower lobes. No frank consolidative airspace disease. No pleural effusions. Musculoskeletal: Median sternotomy wires. There are no aggressive appearing lytic or blastic lesions noted in the visualized portions of the skeleton. CT ABDOMEN PELVIS FINDINGS Hepatobiliary: No definite suspicious cystic or solid hepatic lesions are confidently identified on today's noncontrast CT examination. Unenhanced appearance of the gallbladder is normal. Pancreas: No definite pancreatic mass or peripancreatic fluid collections or inflammatory changes. Spleen: Unremarkable. Adrenals/Urinary Tract: Percutaneous  nephrostomy tubes bilaterally. Gas in the collecting systems of both kidneys is iatrogenic. Atrophy of the right kidney. Low-attenuation lesions in the right kidney, incompletely  characterized on today's non-contrast CT examination, but statistically likely to represent cysts, measuring up to 2.7 cm. 9 mm calcification in the lower pole collecting system of left kidney, multiple nonobstructive calculi are noted within the collecting system of the left kidney, measuring up to 7 mm in the lower pole. No additional calculi are noted along the course of either ureter. Bladder exstrophy. Known bladder mass poorly evaluated on today's noncontrast CT examination, but estimated to measure approximately 6.3 x 5.4 x 6.7 cm (axial image 118 of series 2 and coronal image 59 of series 5). Small amount of gas in the urinary bladder. Stomach/Bowel: Unenhanced appearance of the stomach is normal. No pathologic dilatation of small bowel or colon. Normal appendix. Vascular/Lymphatic: Aortic atherosclerosis. Enlarging inguinal lymph nodes bilaterally measuring up to 18 mm in short axis on both sides (axial image 118 of series 2 on the right and axial image 123 of series 2 on the left). Enlarged right external iliac lymph node measuring 17 mm in short axis (axial image 114 of series 2). Enlarged left external iliac node (axial image 104 of series 2) measuring 12 mm in short axis. Multiple prominent borderline enlarged retroperitoneal lymph nodes, nonspecific but suspicious. Reproductive: Dystrophic calcifications in the prostate gland. Other: No significant volume of ascites.  No pneumoperitoneum. Musculoskeletal: There are no aggressive appearing lytic or blastic lesions noted in the visualized portions of the skeleton. IMPRESSION: 1. Bladder exstrophy with enlarging bladder mass and worsening pelvic lymphadenopathy, as detailed above, indicative of progressive disease. 2. Patchy areas of very mild peribronchovascular ground-glass attenuation in the right upper and lower lobes, nonspecific, but likely of infectious or inflammatory etiology. 3. Tiny pulmonary nodules in the left lung measuring 2-3 mm in size.  These are nonspecific, but statistically likely benign. Attention at time of routine follow-up imaging is recommended to exclude the possibility of metastatic disease to the lungs. 4. Aortic atherosclerosis, in addition to three-vessel coronary artery disease. Status post median sternotomy for CABG including LIMA to the LAD. 5. Additional incidental findings, as above. Electronically Signed   By: Vinnie Langton M.D.   On: 07/17/2018 12:50   Ct Head Wo Contrast  Result Date: 07/24/2018 CLINICAL DATA:  Fall.  History of bladder cancer. EXAM: CT HEAD WITHOUT CONTRAST CT CERVICAL SPINE WITHOUT CONTRAST TECHNIQUE: Multidetector CT imaging of the head and cervical spine was performed following the standard protocol without intravenous contrast. Multiplanar CT image reconstructions of the cervical spine were also generated. COMPARISON:  None. FINDINGS: CT HEAD FINDINGS Brain: No evidence of acute infarction, hemorrhage, hydrocephalus, extra-axial collection or mass lesion/mass effect. Vascular: No hyperdense vessel or unexpected calcification. Skull: Normal. Negative for fracture or focal lesion. Sinuses/Orbits: No acute finding. Other: None. CT CERVICAL SPINE FINDINGS Alignment: No traumatic malalignment. Skull base and vertebrae: No acute fracture. No primary bone lesion or focal pathologic process. Congenital C5-C6 fusion. Soft tissues and spinal canal: No prevertebral fluid or swelling. No visible canal hematoma. Disc levels: Moderate disc height loss with posterior disc osteophyte complex and right greater than left uncovertebral hypertrophy at C3-C4. Mild-to-moderate spinal canal stenosis. Severe right neuroforaminal stenosis. Mild disc height loss with left paracentral and medial foraminal disc protrusion at C6-C7. Moderate left uncovertebral hypertrophy. Moderate left neuroforaminal stenosis. Upper chest: New mild interlobular septal thickening in the lung apices. Other: Scattered small foci of intravascular  air in the neck. IMPRESSION: 1.  No acute intracranial abnormality. 2.  No acute cervical spine fracture. 3. C3-C4 and C6-C7 spondylosis as described above. 4. New mild interlobular septal thickening in the lung apices, consistent with interstitial pulmonary edema. Electronically Signed   By: Titus Dubin M.D.   On: 07/24/2018 12:28   Ct Chest Wo Contrast  Result Date: 07/17/2018 CLINICAL DATA:  58 year old male with history of bladder cancer diagnosed in June 2019 status post surgical resection and radiation therapy (completed in February 2020) as well as chemotherapy (completed in December 2019). Followup study. EXAM: CT CHEST, ABDOMEN AND PELVIS WITHOUT CONTRAST TECHNIQUE: Multidetector CT imaging of the chest, abdomen and pelvis was performed following the standard protocol without IV contrast. COMPARISON:  CT the abdomen and pelvis 04/21/2018. FINDINGS: CT CHEST FINDINGS Cardiovascular: Heart size is normal. There is no significant pericardial fluid, thickening or pericardial calcification. There is aortic atherosclerosis, as well as atherosclerosis of the great vessels of the mediastinum and the coronary arteries, including calcified atherosclerotic plaque in the left anterior descending, left circumflex and right coronary arteries. Status post median sternotomy for CABG including LIMA to the LAD. Right internal jugular single-lumen porta cath with tip terminating in the right atrium. Mediastinum/Nodes: No pathologically enlarged mediastinal or hilar lymph nodes. Please note that accurate exclusion of hilar adenopathy is limited on noncontrast CT scans. Esophagus is unremarkable in appearance. No axillary lymphadenopathy. Lungs/Pleura: 3 mm left upper lobe nodule (axial image 30 of series 4). 2 mm left upper lobe nodule (axial image 66 of series 4). No other larger more suspicious appearing pulmonary nodules or masses are noted. Several very subtle areas of peribronchovascular ground-glass attenuation  are noted throughout the right upper and right lower lobes. No frank consolidative airspace disease. No pleural effusions. Musculoskeletal: Median sternotomy wires. There are no aggressive appearing lytic or blastic lesions noted in the visualized portions of the skeleton. CT ABDOMEN PELVIS FINDINGS Hepatobiliary: No definite suspicious cystic or solid hepatic lesions are confidently identified on today's noncontrast CT examination. Unenhanced appearance of the gallbladder is normal. Pancreas: No definite pancreatic mass or peripancreatic fluid collections or inflammatory changes. Spleen: Unremarkable. Adrenals/Urinary Tract: Percutaneous nephrostomy tubes bilaterally. Gas in the collecting systems of both kidneys is iatrogenic. Atrophy of the right kidney. Low-attenuation lesions in the right kidney, incompletely characterized on today's non-contrast CT examination, but statistically likely to represent cysts, measuring up to 2.7 cm. 9 mm calcification in the lower pole collecting system of left kidney, multiple nonobstructive calculi are noted within the collecting system of the left kidney, measuring up to 7 mm in the lower pole. No additional calculi are noted along the course of either ureter. Bladder exstrophy. Known bladder mass poorly evaluated on today's noncontrast CT examination, but estimated to measure approximately 6.3 x 5.4 x 6.7 cm (axial image 118 of series 2 and coronal image 59 of series 5). Small amount of gas in the urinary bladder. Stomach/Bowel: Unenhanced appearance of the stomach is normal. No pathologic dilatation of small bowel or colon. Normal appendix. Vascular/Lymphatic: Aortic atherosclerosis. Enlarging inguinal lymph nodes bilaterally measuring up to 18 mm in short axis on both sides (axial image 118 of series 2 on the right and axial image 123 of series 2 on the left). Enlarged right external iliac lymph node measuring 17 mm in short axis (axial image 114 of series 2). Enlarged left  external iliac node (axial image 104 of series 2) measuring 12 mm in short axis. Multiple prominent borderline enlarged  retroperitoneal lymph nodes, nonspecific but suspicious. Reproductive: Dystrophic calcifications in the prostate gland. Other: No significant volume of ascites.  No pneumoperitoneum. Musculoskeletal: There are no aggressive appearing lytic or blastic lesions noted in the visualized portions of the skeleton. IMPRESSION: 1. Bladder exstrophy with enlarging bladder mass and worsening pelvic lymphadenopathy, as detailed above, indicative of progressive disease. 2. Patchy areas of very mild peribronchovascular ground-glass attenuation in the right upper and lower lobes, nonspecific, but likely of infectious or inflammatory etiology. 3. Tiny pulmonary nodules in the left lung measuring 2-3 mm in size. These are nonspecific, but statistically likely benign. Attention at time of routine follow-up imaging is recommended to exclude the possibility of metastatic disease to the lungs. 4. Aortic atherosclerosis, in addition to three-vessel coronary artery disease. Status post median sternotomy for CABG including LIMA to the LAD. 5. Additional incidental findings, as above. Electronically Signed   By: Vinnie Langton M.D.   On: 07/17/2018 12:50   Ct Cervical Spine Wo Contrast  Result Date: 07/24/2018 CLINICAL DATA:  Fall.  History of bladder cancer. EXAM: CT HEAD WITHOUT CONTRAST CT CERVICAL SPINE WITHOUT CONTRAST TECHNIQUE: Multidetector CT imaging of the head and cervical spine was performed following the standard protocol without intravenous contrast. Multiplanar CT image reconstructions of the cervical spine were also generated. COMPARISON:  None. FINDINGS: CT HEAD FINDINGS Brain: No evidence of acute infarction, hemorrhage, hydrocephalus, extra-axial collection or mass lesion/mass effect. Vascular: No hyperdense vessel or unexpected calcification. Skull: Normal. Negative for fracture or focal lesion.  Sinuses/Orbits: No acute finding. Other: None. CT CERVICAL SPINE FINDINGS Alignment: No traumatic malalignment. Skull base and vertebrae: No acute fracture. No primary bone lesion or focal pathologic process. Congenital C5-C6 fusion. Soft tissues and spinal canal: No prevertebral fluid or swelling. No visible canal hematoma. Disc levels: Moderate disc height loss with posterior disc osteophyte complex and right greater than left uncovertebral hypertrophy at C3-C4. Mild-to-moderate spinal canal stenosis. Severe right neuroforaminal stenosis. Mild disc height loss with left paracentral and medial foraminal disc protrusion at C6-C7. Moderate left uncovertebral hypertrophy. Moderate left neuroforaminal stenosis. Upper chest: New mild interlobular septal thickening in the lung apices. Other: Scattered small foci of intravascular air in the neck. IMPRESSION: 1.  No acute intracranial abnormality. 2.  No acute cervical spine fracture. 3. C3-C4 and C6-C7 spondylosis as described above. 4. New mild interlobular septal thickening in the lung apices, consistent with interstitial pulmonary edema. Electronically Signed   By: Titus Dubin M.D.   On: 07/24/2018 12:28   Dg Chest Port 1 View  Result Date: 07/24/2018 CLINICAL DATA:  Coming from home. Wife called EMS for a fall in the bathroom pt could not get up unassisted. Bladder cancer patient, due for chemo today. EXAM: PORTABLE CHEST 1 VIEW COMPARISON:  07/17/2018, chest CT. FINDINGS: Stable changes from prior cardiac surgery. Cardiac silhouette is normal in size and configuration. No mediastinal or hilar masses or evidence of adenopathy. Clear lungs.  No pleural effusion or pneumothorax. Skeletal structures are grossly intact. Stable right anterior chest wall Port-A-Cath, tip at the caval atrial junction. IMPRESSION: No acute cardiopulmonary disease. Electronically Signed   By: Lajean Manes M.D.   On: 07/24/2018 10:30   Ir Nephrostomy Exchange Left  Result Date:  06/30/2018 INDICATION: 58 year old male with chronic bilateral indwelling percutaneous nephrostomy tubes. Unfortunately, he has experienced multiple episodes of recurrent early occlusion of the left-sided tube due to sediment and mucous deposition. He presents today with decreased tube output and left flank pain. He has no  issues with the right-sided tube and would like to defer exchange of that tube at this time. EXAM: Nephrostomy tube exchange COMPARISON:  Most recent prior nephrostomy tube exchange 06/09/2018 MEDICATIONS: None ANESTHESIA/SEDATION: None CONTRAST:  10 mL Omnipaque 300-administered into the collecting system(s) FLUOROSCOPY TIME:  Fluoroscopy Time: 1 minutes 54 seconds (14 mGy). COMPLICATIONS: None immediate. PROCEDURE: Informed written consent was obtained from the patient after a thorough discussion of the procedural risks, benefits and alternatives. All questions were addressed. Maximal Sterile Barrier Technique was utilized including caps, mask, sterile gowns, sterile gloves, sterile drape, hand hygiene and skin antiseptic. A timeout was performed prior to the initiation of the procedure. Contrast was injected through the existing catheter. The catheter remains well positioned within the renal pelvis. The catheter was transected and removed with difficulty over a stiff Glidewire. The catheter was inspected and found to have significant sediment deposition obstructing the majority of the sideholes. Additionally, the outside of the catheter was could in a mucous like substance. The decision was made to proceed with further up sizing to 14 [place]. The 14 French catheter was further modified by slightly enlarging several of the sideholes. The catheter was carefully advanced over the wire and into the renal pelvis where it was successfully formed. A gentle contrast injection confirms that the catheter is well positioned in the renal pelvis. The catheter was flushed and connected to gravity bag  drainage. The catheter was then secured to the skin with 0 Prolene suture. IMPRESSION: Successful exchange and up size to a now 21 French percutaneous nephrostomy tube on the left. Additionally, the sideholes were slightly modified and enlarged. Electronically Signed   By: Jacqulynn Cadet M.D.   On: 06/30/2018 14:58    Microbiology: Recent Results (from the past 240 hour(s))  SARS Coronavirus 2 (CEPHEID- Performed in [place] hospital lab), Hosp Order     Status: None   Collection Time: 07/24/18  9:35 AM   Specimen: Nasopharyngeal Swab  Result Value Ref Range Status   SARS Coronavirus 2 NEGATIVE NEGATIVE Final    Comment: (NOTE) If result is NEGATIVE SARS-CoV-2 target nucleic acids are NOT DETECTED. The SARS-CoV-2 RNA is generally detectable in upper and lower  respiratory specimens during the acute phase of infection. The lowest  concentration of SARS-CoV-2 viral copies this assay can detect is 250  copies / mL. A negative result does not preclude SARS-CoV-2 infection  and should not be used as the sole basis for treatment or other  patient management decisions.  A negative result may occur with  improper specimen collection / handling, submission of specimen other  than nasopharyngeal swab, presence of viral mutation(s) within the  areas targeted by this assay, and inadequate number of viral copies  (<250 copies / mL). A negative result must be combined with clinical  observations, patient history, and epidemiological information. If result is POSITIVE SARS-CoV-2 target nucleic acids are DETECTED. The SARS-CoV-2 RNA is generally detectable in upper and lower  respiratory specimens dur ing the acute phase of infection.  Positive  results are indicative of active infection with SARS-CoV-2.  Clinical  correlation with patient history and other diagnostic information is  necessary to determine patient infection status.  Positive results do  not rule out bacterial infection or  co-infection with other viruses. If result is PRESUMPTIVE POSTIVE SARS-CoV-2 nucleic acids MAY BE PRESENT.   A presumptive positive result was obtained on the submitted specimen  and confirmed on repeat testing.  While 2019 novel coronavirus  (SARS-CoV-2)  nucleic acids may be present in the submitted sample  additional confirmatory testing may be necessary for epidemiological  and / or clinical management purposes  to differentiate between  SARS-CoV-2 and other Sarbecovirus currently known to infect humans.  If clinically indicated additional testing with an alternate test  methodology [phone]) is advised. The SARS-CoV-2 RNA is generally  detectable in upper and lower respiratory sp ecimens during the acute  phase of infection. The expected result is Negative. Fact Sheet for Patients:  StrictlyIdeas.no Fact Sheet for Healthcare Providers: BankingDealers.co.za This test is not yet approved or cleared by the [place] FDA and has been authorized for detection and/or diagnosis of SARS-CoV-2 by FDA under an Emergency Use Authorization (EUA).  This EUA will remain in effect (meaning this test can be used) for the duration of the COVID-19 declaration under Section 564(b)(1) of the Act, 21 U.S.C. section 360bbb-3(b)(1), unless the authorization is terminated or revoked sooner. Performed at [facility], [place] [address] [place], [place] [address]   Blood Culture (routine x 2)     Status: None   Collection Time: 07/24/18  9:54 AM   Specimen: BLOOD RIGHT FOREARM  Result Value Ref Range Status   Specimen Description BLOOD RIGHT FOREARM  Final   Special Requests   Final    BOTTLES DRAWN AEROBIC AND ANAEROBIC Blood Culture results may not be optimal due to an excessive volume of blood received in culture bottles   Culture   Final    NO GROWTH 5 DAYS Performed at [facility], [place] [address] [place], [place] [address]    Report  Status 07/29/2018 FINAL  Final  Blood Culture (routine x 2)     Status: None   Collection Time: 07/24/18  9:54 AM   Specimen: BLOOD RIGHT WRIST  Result Value Ref Range Status   Specimen Description BLOOD RIGHT WRIST  Final   Special Requests   Final    BOTTLES DRAWN AEROBIC AND ANAEROBIC Blood Culture adequate volume   Culture   Final    NO GROWTH 5 DAYS Performed at [facility], [place] [address] [place], [place] [address]    Report Status 07/29/2018 FINAL  Final  Urine culture     Status: Abnormal   Collection Time: 07/24/18 10:23 AM   Specimen: In/Out Cath Urine  Result Value Ref Range Status   Specimen Description IN/OUT CATH URINE  Final   Special Requests   Final    NONE Performed at [facility], [place] [address] [place], [place] [address]    Culture (A)  Final    >=100,000 COLONIES/mL PROTEUS PENNERI >=100,000 COLONIES/mL MORGANELLA MORGANII >=100,000 COLONIES/mL KOCURIA SPECIES    Report Status 07/28/2018 FINAL  Final   Organism ID, Bacteria PROTEUS PENNERI (A)  Final   Organism ID, Bacteria MORGANELLA MORGANII (A)  Final      Susceptibility   Morganella morganii - MIC*    AMPICILLIN >=32 RESISTANT Resistant     CEFAZOLIN >=64 RESISTANT Resistant     CEFTRIAXONE <=1 SENSITIVE Sensitive     CIPROFLOXACIN <=0.25 SENSITIVE Sensitive     GENTAMICIN <=1 SENSITIVE Sensitive     IMIPENEM 1 SENSITIVE Sensitive     NITROFURANTOIN 128 RESISTANT Resistant     TRIMETH/SULFA <=20 SENSITIVE Sensitive     AMPICILLIN/SULBACTAM 16 INTERMEDIATE Intermediate     PIP/TAZO <=4 SENSITIVE Sensitive     * >=100,000 COLONIES/mL MORGANELLA MORGANII   Proteus penneri - MIC*  AMPICILLIN >=32 RESISTANT Resistant     CEFAZOLIN >=64 RESISTANT Resistant     CEFTRIAXONE 8 SENSITIVE Sensitive     CIPROFLOXACIN <=0.25 SENSITIVE Sensitive     GENTAMICIN <=1 SENSITIVE Sensitive     IMIPENEM 4 SENSITIVE Sensitive     NITROFURANTOIN 128 RESISTANT Resistant     TRIMETH/SULFA <=20  SENSITIVE Sensitive     AMPICILLIN/SULBACTAM >=32 RESISTANT Resistant     PIP/TAZO <=4 SENSITIVE Sensitive     * >=100,000 COLONIES/mL PROTEUS PENNERI  MRSA PCR Screening     Status: None   Collection Time: 07/24/18  3:08 PM   Specimen: Nasal Mucosa; Nasopharyngeal  Result Value Ref Range Status   MRSA by PCR NEGATIVE NEGATIVE Final    Comment:        The GeneXpert MRSA Assay (FDA approved for NASAL specimens only), is one component of a comprehensive MRSA colonization surveillance program. It is not intended to diagnose MRSA infection nor to guide or monitor treatment for MRSA infections. Performed at [facility], [place] [address] [place], [place] [address]      Labs: Basic Metabolic Panel: Recent Labs  Lab 07/24/18 0935 07/25/18 0442 07/26/18 0320 07/27/18 0340 07/28/18 0419 07/29/18 0401  NA 132* 139 137 134* 137 138  K 4.0 4.1 4.0 3.5 3.5 3.8  CL 99 107 105 102 104 103  CO2 23 23 23 23 26 27   GLUCOSE 245* 149* 129* 149* 143* 145*  BUN 33* 18 18 19  24* 23*  CREATININE 2.61* 1.80* 1.47* 1.25* 1.35* 1.24  CALCIUM 8.9 8.5* 8.4* 8.5* 8.4* 8.4*  MG 2.0 1.6* 2.2  --   --   --   PHOS 2.6 2.4* 2.6  --  2.3* 1.4*   Liver Function Tests: Recent Labs  Lab 07/24/18 0935 07/28/18 0419 07/29/18 0401  AST 17   17  --   --   ALT 12   11  --   --   ALKPHOS 77   79  --   --   BILITOT 0.4   0.4  --   --   PROT 6.3*   6.3*  --   --   ALBUMIN 1.8*   1.9* 1.4* 1.5*   No results for input(s): LIPASE, AMYLASE in the last 168 hours. No results for input(s): AMMONIA in the last 168 hours. CBC: Recent Labs  Lab 07/24/18 0935  07/26/18 0320 07/27/18 0340 07/28/18 0419 07/28/18 1207 07/28/18 1642 07/29/18 0401  WBC 13.0*   < > 17.8* 15.6* 11.4*  --  10.3 10.8*  NEUTROABS 11.5*  --   --  14.2* 10.1*  --   --  9.2*  HGB 6.4*   < > 7.3* 7.1* 7.3* 9.6* 8.6* 8.7*  HCT 21.0*   < > 22.8* 22.3* 23.1* 29.5* 26.7* 26.7*  MCV 93.8   < > 90.8 89.9 90.2  --  89.9 89.6  PLT  360   < > 358 326 300  --  307 330   < > = values in this interval not displayed.   Cardiac Enzymes: No results for input(s): CKTOTAL, CKMB, CKMBINDEX, TROPONINI in the last 168 hours. BNP: BNP (last 3 results) No results for input(s): BNP in the last 8760 hours.  ProBNP (last 3 results) No results for input(s): PROBNP in the last 8760 hours.  CBG: Recent Labs  Lab 07/28/18 2156 07/28/18 2337 07/29/18 0400 07/29/18 0750 07/29/18 1307  GLUCAP 182* 216* 127* 127* 215*  Signed:  Nita Sells MD   [facility] 07/29/2018, 3:01 PM

## 2018-07-29 NOTE — Consult Note (Addendum)
   [facility] Inpatient Consult   07/29/2018  Jorrell Kuster 1960-10-23 [id number]  Medicare NGACO:  Extreme high risk score 30% for transition from the hospital today.  Addendum:  9470:  Call received from Dr. Verlon Au regarding patient plan had changed from pursuing [facility] at transition to home. This Probation officer contacted the patient by phone and HIPAA was verified.  Explained to the patient regarding [facility] services and eligible programs.  Patient confirms his primary care provider is Caesar Chestnut, NP with [facility], this practice is listed to provide the transition of care follow up.  Plan:  Have information regarding [facility] sent to the patient. Patient declines any follow up states, "I have my wife and kids at home, I feel like I will be fine." Encouraged him to call [facility] and will send information.  Natividad Brood, RN BSN [facility] Liaison  [phone] business mobile phone Toll free office [phone]  Fax number: [phone] [email or web address]    1330: Chart reviewed and reveals the patient is currently transitioning to Palliative Care.  Per MD notes as follows: Admitted by critical care medicine secondary to septic shock, acute kidney injury on admission, lactic acidosis-required fluid resuscitation, IV pressors Urology saw the patient did not think debridement of the mass would add much, patient with advanced bladder cancer with metastatic disease currently receiving palliative chemotherapy.  Patient will be followed by [facility] with [facility] home palliative program. Reviewed [facility] notes as well.  Given this choice patient will have full case management services through [facility] and no needs identified for [facility]  case management for follow up.  Natividad Brood, RN BSN [facility] Liaison   [phone] business mobile phone Toll free office [phone]  Fax number: [phone] [email or web address]

## 2018-07-29 NOTE — Progress Notes (Signed)
Physical Therapy Discharge Patient Details Name: Andrew Joyce MRN: [id number] DOB: 1960-10-24 Today's Date: 07/29/2018 Time:  -     Patient discharged from PT services secondary to goals met and no further PT needs identified.  Please see latest therapy progress note for current level of functioning and progress toward goals.    Progress and discharge plan discussed with patient and/or caregiver: Patient/Caregiver agrees with plan  Spoke with patient this morning with regards to gait and stair training prior to d/c this afternoon. Pt declining additional acute PT services, reports he feels confident returning home safely. Pt declining follow-up PT services. Will d/c acute PT.    Mabeline Caras, PT, DPT Acute Rehabilitation Services  Pager [phone] Office [place] 07/29/2018, 9:14 AM

## 2018-07-29 NOTE — Telephone Encounter (Signed)
LVM, reminding pt of his appt on 07-30-18 with Dr Martinique.

## 2018-07-30 ENCOUNTER — Other Ambulatory Visit (HOSPITAL_COMMUNITY): Payer: Self-pay | Admitting: Interventional Radiology

## 2018-07-30 ENCOUNTER — Telehealth: Payer: Self-pay | Admitting: *Deleted

## 2018-07-30 ENCOUNTER — Ambulatory Visit: Payer: Medicare Other | Admitting: Cardiology

## 2018-07-30 ENCOUNTER — Ambulatory Visit: Admission: RE | Admit: 2018-07-30 | Payer: Medicare Other | Source: Ambulatory Visit

## 2018-07-30 ENCOUNTER — Ambulatory Visit (HOSPITAL_COMMUNITY)
Admission: RE | Admit: 2018-07-30 | Discharge: 2018-07-30 | Disposition: A | Discharge status: Home / Self Care | Payer: Medicare Other | Source: Ambulatory Visit | Attending: Interventional Radiology | Admitting: Interventional Radiology

## 2018-07-30 ENCOUNTER — Other Ambulatory Visit: Payer: Self-pay

## 2018-07-30 ENCOUNTER — Encounter (HOSPITAL_COMMUNITY): Payer: Self-pay | Admitting: Interventional Radiology

## 2018-07-30 DIAGNOSIS — Z436 Encounter for attention to other artificial openings of urinary tract: Secondary | ICD-10-CM | POA: Insufficient documentation

## 2018-07-30 DIAGNOSIS — N1330 Unspecified hydronephrosis: Secondary | ICD-10-CM | POA: Insufficient documentation

## 2018-07-30 DIAGNOSIS — C679 Malignant neoplasm of bladder, unspecified: Secondary | ICD-10-CM | POA: Diagnosis not present

## 2018-07-30 HISTORY — PX: IR NEPHROSTOMY EXCHANGE LEFT: IMG6069

## 2018-07-30 HISTORY — PX: IR NEPHROSTOMY EXCHANGE RIGHT: IMG6070

## 2018-07-30 MED ORDER — IOHEXOL 300 MG/ML  SOLN
50.0000 mL | Freq: Once | INTRAMUSCULAR | Status: AC | PRN
  Administered 2018-07-30: 10 mL

## 2018-07-30 MED ORDER — LIDOCAINE HCL (PF) 1 % IJ SOLN
INTRAMUSCULAR | Status: AC | PRN
  Administered 2018-07-30 (×2): 2 mL

## 2018-07-30 MED ORDER — LIDOCAINE HCL 1 % IJ SOLN
INTRAMUSCULAR | Status: AC
  Filled 2018-07-30: qty 20

## 2018-07-30 NOTE — Telephone Encounter (Signed)
Pt was on TCM report admitted 07/24/18 with sepsis unclear cause. 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 Multiple organisms growing, Proteus, Morganella wasgiven immunocompromised would treat for total of 14 days-transitioning from IV ceftriaxone. Patient is at extremely high risk for readmission, death etc. Pt D/C 2018-08-17, and will follow-up w/oncologist in 1 week.Marland KitchenJohny Chess

## 2018-08-03 ENCOUNTER — Other Ambulatory Visit (HOSPITAL_COMMUNITY): Payer: Medicare Other

## 2018-08-03 ENCOUNTER — Telehealth: Payer: Self-pay | Admitting: Radiation Oncology

## 2018-08-03 ENCOUNTER — Telehealth: Payer: Self-pay | Admitting: Neurology

## 2018-08-03 DIAGNOSIS — C679 Malignant neoplasm of bladder, unspecified: Secondary | ICD-10-CM | POA: Diagnosis not present

## 2018-08-03 DIAGNOSIS — C671 Malignant neoplasm of dome of bladder: Secondary | ICD-10-CM | POA: Diagnosis not present

## 2018-08-03 NOTE — Telephone Encounter (Signed)
Yes, his order should still be active - it was placed in June 2020.  You may need to call [facility] to schedule.  Thanks.

## 2018-08-03 NOTE — Telephone Encounter (Signed)
I contacted patient and he is to contact [facility]

## 2018-08-03 NOTE — Telephone Encounter (Signed)
Faxed EOT note as requested. Fax confirmation of delivery obtained.

## 2018-08-03 NOTE — Telephone Encounter (Signed)
Okay to order mri of brain with and with out contrast?

## 2018-08-03 NOTE — Telephone Encounter (Signed)
Patient is calling in about MRI. He said he couldn't have it done in the past because he was in the Hospital and now he is wanting to get this done. Thanks!

## 2018-08-05 ENCOUNTER — Other Ambulatory Visit: Payer: Self-pay | Admitting: Oncology

## 2018-08-06 DIAGNOSIS — E43 Unspecified severe protein-calorie malnutrition: Secondary | ICD-10-CM | POA: Diagnosis not present

## 2018-08-06 DIAGNOSIS — Z436 Encounter for attention to other artificial openings of urinary tract: Secondary | ICD-10-CM | POA: Diagnosis not present

## 2018-08-06 DIAGNOSIS — C775 Secondary and unspecified malignant neoplasm of intrapelvic lymph nodes: Secondary | ICD-10-CM | POA: Diagnosis not present

## 2018-08-06 DIAGNOSIS — E781 Pure hyperglyceridemia: Secondary | ICD-10-CM | POA: Diagnosis not present

## 2018-08-06 DIAGNOSIS — Z87718 Personal history of other specified (corrected) congenital malformations of genitourinary system: Secondary | ICD-10-CM | POA: Diagnosis not present

## 2018-08-06 DIAGNOSIS — Z87442 Personal history of urinary calculi: Secondary | ICD-10-CM | POA: Diagnosis not present

## 2018-08-06 DIAGNOSIS — E1122 Type 2 diabetes mellitus with diabetic chronic kidney disease: Secondary | ICD-10-CM | POA: Diagnosis not present

## 2018-08-06 DIAGNOSIS — I951 Orthostatic hypotension: Secondary | ICD-10-CM | POA: Diagnosis not present

## 2018-08-06 DIAGNOSIS — C679 Malignant neoplasm of bladder, unspecified: Secondary | ICD-10-CM | POA: Diagnosis not present

## 2018-08-06 DIAGNOSIS — I25729 Atherosclerosis of autologous artery coronary artery bypass graft(s) with unspecified angina pectoris: Secondary | ICD-10-CM | POA: Diagnosis not present

## 2018-08-06 DIAGNOSIS — Z8619 Personal history of other infectious and parasitic diseases: Secondary | ICD-10-CM | POA: Diagnosis not present

## 2018-08-06 DIAGNOSIS — Z87440 Personal history of urinary (tract) infections: Secondary | ICD-10-CM | POA: Diagnosis not present

## 2018-08-06 DIAGNOSIS — N182 Chronic kidney disease, stage 2 (mild): Secondary | ICD-10-CM | POA: Diagnosis not present

## 2018-08-06 DIAGNOSIS — Z452 Encounter for adjustment and management of vascular access device: Secondary | ICD-10-CM | POA: Diagnosis not present

## 2018-08-06 DIAGNOSIS — Z951 Presence of aortocoronary bypass graft: Secondary | ICD-10-CM | POA: Diagnosis not present

## 2018-08-07 ENCOUNTER — Encounter: Payer: Medicare Other | Admitting: Internal Medicine

## 2018-08-07 DIAGNOSIS — E43 Unspecified severe protein-calorie malnutrition: Secondary | ICD-10-CM | POA: Diagnosis not present

## 2018-08-07 DIAGNOSIS — Z436 Encounter for attention to other artificial openings of urinary tract: Secondary | ICD-10-CM | POA: Diagnosis not present

## 2018-08-07 DIAGNOSIS — C775 Secondary and unspecified malignant neoplasm of intrapelvic lymph nodes: Secondary | ICD-10-CM | POA: Diagnosis not present

## 2018-08-07 DIAGNOSIS — Z452 Encounter for adjustment and management of vascular access device: Secondary | ICD-10-CM | POA: Diagnosis not present

## 2018-08-07 DIAGNOSIS — Z87440 Personal history of urinary (tract) infections: Secondary | ICD-10-CM | POA: Diagnosis not present

## 2018-08-07 DIAGNOSIS — C679 Malignant neoplasm of bladder, unspecified: Secondary | ICD-10-CM | POA: Diagnosis not present

## 2018-08-08 DIAGNOSIS — Z87442 Personal history of urinary calculi: Secondary | ICD-10-CM | POA: Diagnosis not present

## 2018-08-08 DIAGNOSIS — Z452 Encounter for adjustment and management of vascular access device: Secondary | ICD-10-CM | POA: Diagnosis not present

## 2018-08-08 DIAGNOSIS — C775 Secondary and unspecified malignant neoplasm of intrapelvic lymph nodes: Secondary | ICD-10-CM | POA: Diagnosis not present

## 2018-08-08 DIAGNOSIS — E43 Unspecified severe protein-calorie malnutrition: Secondary | ICD-10-CM | POA: Diagnosis not present

## 2018-08-08 DIAGNOSIS — Z87440 Personal history of urinary (tract) infections: Secondary | ICD-10-CM | POA: Diagnosis not present

## 2018-08-08 DIAGNOSIS — E781 Pure hyperglyceridemia: Secondary | ICD-10-CM | POA: Diagnosis not present

## 2018-08-08 DIAGNOSIS — I951 Orthostatic hypotension: Secondary | ICD-10-CM | POA: Diagnosis not present

## 2018-08-08 DIAGNOSIS — Z87718 Personal history of other specified (corrected) congenital malformations of genitourinary system: Secondary | ICD-10-CM | POA: Diagnosis not present

## 2018-08-08 DIAGNOSIS — Z436 Encounter for attention to other artificial openings of urinary tract: Secondary | ICD-10-CM | POA: Diagnosis not present

## 2018-08-08 DIAGNOSIS — N182 Chronic kidney disease, stage 2 (mild): Secondary | ICD-10-CM | POA: Diagnosis not present

## 2018-08-08 DIAGNOSIS — Z8619 Personal history of other infectious and parasitic diseases: Secondary | ICD-10-CM | POA: Diagnosis not present

## 2018-08-08 DIAGNOSIS — Z951 Presence of aortocoronary bypass graft: Secondary | ICD-10-CM | POA: Diagnosis not present

## 2018-08-08 DIAGNOSIS — C679 Malignant neoplasm of bladder, unspecified: Secondary | ICD-10-CM | POA: Diagnosis not present

## 2018-08-08 DIAGNOSIS — I25729 Atherosclerosis of autologous artery coronary artery bypass graft(s) with unspecified angina pectoris: Secondary | ICD-10-CM | POA: Diagnosis not present

## 2018-08-08 DIAGNOSIS — E1122 Type 2 diabetes mellitus with diabetic chronic kidney disease: Secondary | ICD-10-CM | POA: Diagnosis not present

## 2018-08-12 DIAGNOSIS — Z87440 Personal history of urinary (tract) infections: Secondary | ICD-10-CM | POA: Diagnosis not present

## 2018-08-12 DIAGNOSIS — E43 Unspecified severe protein-calorie malnutrition: Secondary | ICD-10-CM | POA: Diagnosis not present

## 2018-08-12 DIAGNOSIS — Z436 Encounter for attention to other artificial openings of urinary tract: Secondary | ICD-10-CM | POA: Diagnosis not present

## 2018-08-12 DIAGNOSIS — Z452 Encounter for adjustment and management of vascular access device: Secondary | ICD-10-CM | POA: Diagnosis not present

## 2018-08-12 DIAGNOSIS — C775 Secondary and unspecified malignant neoplasm of intrapelvic lymph nodes: Secondary | ICD-10-CM | POA: Diagnosis not present

## 2018-08-12 DIAGNOSIS — C679 Malignant neoplasm of bladder, unspecified: Secondary | ICD-10-CM | POA: Diagnosis not present

## 2018-08-13 DIAGNOSIS — C775 Secondary and unspecified malignant neoplasm of intrapelvic lymph nodes: Secondary | ICD-10-CM | POA: Diagnosis not present

## 2018-08-13 DIAGNOSIS — C679 Malignant neoplasm of bladder, unspecified: Secondary | ICD-10-CM | POA: Diagnosis not present

## 2018-08-13 DIAGNOSIS — Z452 Encounter for adjustment and management of vascular access device: Secondary | ICD-10-CM | POA: Diagnosis not present

## 2018-08-13 DIAGNOSIS — Z87440 Personal history of urinary (tract) infections: Secondary | ICD-10-CM | POA: Diagnosis not present

## 2018-08-13 DIAGNOSIS — Z436 Encounter for attention to other artificial openings of urinary tract: Secondary | ICD-10-CM | POA: Diagnosis not present

## 2018-08-13 DIAGNOSIS — E43 Unspecified severe protein-calorie malnutrition: Secondary | ICD-10-CM | POA: Diagnosis not present

## 2018-08-16 DIAGNOSIS — C775 Secondary and unspecified malignant neoplasm of intrapelvic lymph nodes: Secondary | ICD-10-CM | POA: Diagnosis not present

## 2018-08-16 DIAGNOSIS — Z436 Encounter for attention to other artificial openings of urinary tract: Secondary | ICD-10-CM | POA: Diagnosis not present

## 2018-08-16 DIAGNOSIS — E43 Unspecified severe protein-calorie malnutrition: Secondary | ICD-10-CM | POA: Diagnosis not present

## 2018-08-16 DIAGNOSIS — Z87440 Personal history of urinary (tract) infections: Secondary | ICD-10-CM | POA: Diagnosis not present

## 2018-08-16 DIAGNOSIS — Z452 Encounter for adjustment and management of vascular access device: Secondary | ICD-10-CM | POA: Diagnosis not present

## 2018-08-16 DIAGNOSIS — C679 Malignant neoplasm of bladder, unspecified: Secondary | ICD-10-CM | POA: Diagnosis not present

## 2018-08-17 DIAGNOSIS — Z436 Encounter for attention to other artificial openings of urinary tract: Secondary | ICD-10-CM | POA: Diagnosis not present

## 2018-08-17 DIAGNOSIS — E43 Unspecified severe protein-calorie malnutrition: Secondary | ICD-10-CM | POA: Diagnosis not present

## 2018-08-17 DIAGNOSIS — Z452 Encounter for adjustment and management of vascular access device: Secondary | ICD-10-CM | POA: Diagnosis not present

## 2018-08-17 DIAGNOSIS — Z87440 Personal history of urinary (tract) infections: Secondary | ICD-10-CM | POA: Diagnosis not present

## 2018-08-17 DIAGNOSIS — C679 Malignant neoplasm of bladder, unspecified: Secondary | ICD-10-CM | POA: Diagnosis not present

## 2018-08-17 DIAGNOSIS — C775 Secondary and unspecified malignant neoplasm of intrapelvic lymph nodes: Secondary | ICD-10-CM | POA: Diagnosis not present

## 2018-08-18 DIAGNOSIS — C775 Secondary and unspecified malignant neoplasm of intrapelvic lymph nodes: Secondary | ICD-10-CM | POA: Diagnosis not present

## 2018-08-18 DIAGNOSIS — Z452 Encounter for adjustment and management of vascular access device: Secondary | ICD-10-CM | POA: Diagnosis not present

## 2018-08-18 DIAGNOSIS — E43 Unspecified severe protein-calorie malnutrition: Secondary | ICD-10-CM | POA: Diagnosis not present

## 2018-08-18 DIAGNOSIS — Z87440 Personal history of urinary (tract) infections: Secondary | ICD-10-CM | POA: Diagnosis not present

## 2018-08-18 DIAGNOSIS — Z436 Encounter for attention to other artificial openings of urinary tract: Secondary | ICD-10-CM | POA: Diagnosis not present

## 2018-08-18 DIAGNOSIS — C679 Malignant neoplasm of bladder, unspecified: Secondary | ICD-10-CM | POA: Diagnosis not present

## 2018-08-19 DIAGNOSIS — C679 Malignant neoplasm of bladder, unspecified: Secondary | ICD-10-CM | POA: Diagnosis not present

## 2018-08-19 DIAGNOSIS — Z436 Encounter for attention to other artificial openings of urinary tract: Secondary | ICD-10-CM | POA: Diagnosis not present

## 2018-08-19 DIAGNOSIS — C775 Secondary and unspecified malignant neoplasm of intrapelvic lymph nodes: Secondary | ICD-10-CM | POA: Diagnosis not present

## 2018-08-19 DIAGNOSIS — Z87440 Personal history of urinary (tract) infections: Secondary | ICD-10-CM | POA: Diagnosis not present

## 2018-08-19 DIAGNOSIS — E43 Unspecified severe protein-calorie malnutrition: Secondary | ICD-10-CM | POA: Diagnosis not present

## 2018-08-19 DIAGNOSIS — Z452 Encounter for adjustment and management of vascular access device: Secondary | ICD-10-CM | POA: Diagnosis not present

## 2018-08-21 DIAGNOSIS — C679 Malignant neoplasm of bladder, unspecified: Secondary | ICD-10-CM | POA: Diagnosis not present

## 2018-08-21 DIAGNOSIS — Z452 Encounter for adjustment and management of vascular access device: Secondary | ICD-10-CM | POA: Diagnosis not present

## 2018-08-21 DIAGNOSIS — C775 Secondary and unspecified malignant neoplasm of intrapelvic lymph nodes: Secondary | ICD-10-CM | POA: Diagnosis not present

## 2018-08-21 DIAGNOSIS — E43 Unspecified severe protein-calorie malnutrition: Secondary | ICD-10-CM | POA: Diagnosis not present

## 2018-08-21 DIAGNOSIS — Z87440 Personal history of urinary (tract) infections: Secondary | ICD-10-CM | POA: Diagnosis not present

## 2018-08-21 DIAGNOSIS — Z436 Encounter for attention to other artificial openings of urinary tract: Secondary | ICD-10-CM | POA: Diagnosis not present

## 2018-08-22 DIAGNOSIS — Z452 Encounter for adjustment and management of vascular access device: Secondary | ICD-10-CM | POA: Diagnosis not present

## 2018-08-22 DIAGNOSIS — E43 Unspecified severe protein-calorie malnutrition: Secondary | ICD-10-CM | POA: Diagnosis not present

## 2018-08-22 DIAGNOSIS — Z87440 Personal history of urinary (tract) infections: Secondary | ICD-10-CM | POA: Diagnosis not present

## 2018-08-22 DIAGNOSIS — C775 Secondary and unspecified malignant neoplasm of intrapelvic lymph nodes: Secondary | ICD-10-CM | POA: Diagnosis not present

## 2018-08-22 DIAGNOSIS — Z436 Encounter for attention to other artificial openings of urinary tract: Secondary | ICD-10-CM | POA: Diagnosis not present

## 2018-08-22 DIAGNOSIS — C679 Malignant neoplasm of bladder, unspecified: Secondary | ICD-10-CM | POA: Diagnosis not present

## 2018-08-23 DIAGNOSIS — E43 Unspecified severe protein-calorie malnutrition: Secondary | ICD-10-CM | POA: Diagnosis not present

## 2018-08-23 DIAGNOSIS — Z436 Encounter for attention to other artificial openings of urinary tract: Secondary | ICD-10-CM | POA: Diagnosis not present

## 2018-08-23 DIAGNOSIS — Z87440 Personal history of urinary (tract) infections: Secondary | ICD-10-CM | POA: Diagnosis not present

## 2018-08-23 DIAGNOSIS — C775 Secondary and unspecified malignant neoplasm of intrapelvic lymph nodes: Secondary | ICD-10-CM | POA: Diagnosis not present

## 2018-08-23 DIAGNOSIS — C679 Malignant neoplasm of bladder, unspecified: Secondary | ICD-10-CM | POA: Diagnosis not present

## 2018-08-23 DIAGNOSIS — Z452 Encounter for adjustment and management of vascular access device: Secondary | ICD-10-CM | POA: Diagnosis not present

## 2018-08-24 ENCOUNTER — Ambulatory Visit: Payer: Medicare Other | Admitting: Neurology

## 2018-08-24 DIAGNOSIS — Z452 Encounter for adjustment and management of vascular access device: Secondary | ICD-10-CM | POA: Diagnosis not present

## 2018-08-24 DIAGNOSIS — C679 Malignant neoplasm of bladder, unspecified: Secondary | ICD-10-CM | POA: Diagnosis not present

## 2018-08-24 DIAGNOSIS — Z436 Encounter for attention to other artificial openings of urinary tract: Secondary | ICD-10-CM | POA: Diagnosis not present

## 2018-08-24 DIAGNOSIS — C775 Secondary and unspecified malignant neoplasm of intrapelvic lymph nodes: Secondary | ICD-10-CM | POA: Diagnosis not present

## 2018-08-24 DIAGNOSIS — Z87440 Personal history of urinary (tract) infections: Secondary | ICD-10-CM | POA: Diagnosis not present

## 2018-08-24 DIAGNOSIS — E43 Unspecified severe protein-calorie malnutrition: Secondary | ICD-10-CM | POA: Diagnosis not present

## 2018-08-25 DIAGNOSIS — E43 Unspecified severe protein-calorie malnutrition: Secondary | ICD-10-CM | POA: Diagnosis not present

## 2018-08-25 DIAGNOSIS — Z436 Encounter for attention to other artificial openings of urinary tract: Secondary | ICD-10-CM | POA: Diagnosis not present

## 2018-08-25 DIAGNOSIS — C679 Malignant neoplasm of bladder, unspecified: Secondary | ICD-10-CM | POA: Diagnosis not present

## 2018-08-25 DIAGNOSIS — Z452 Encounter for adjustment and management of vascular access device: Secondary | ICD-10-CM | POA: Diagnosis not present

## 2018-08-25 DIAGNOSIS — Z87440 Personal history of urinary (tract) infections: Secondary | ICD-10-CM | POA: Diagnosis not present

## 2018-08-25 DIAGNOSIS — C775 Secondary and unspecified malignant neoplasm of intrapelvic lymph nodes: Secondary | ICD-10-CM | POA: Diagnosis not present

## 2018-08-26 DIAGNOSIS — C775 Secondary and unspecified malignant neoplasm of intrapelvic lymph nodes: Secondary | ICD-10-CM | POA: Diagnosis not present

## 2018-08-26 DIAGNOSIS — C679 Malignant neoplasm of bladder, unspecified: Secondary | ICD-10-CM | POA: Diagnosis not present

## 2018-08-26 DIAGNOSIS — Z452 Encounter for adjustment and management of vascular access device: Secondary | ICD-10-CM | POA: Diagnosis not present

## 2018-08-26 DIAGNOSIS — Z87440 Personal history of urinary (tract) infections: Secondary | ICD-10-CM | POA: Diagnosis not present

## 2018-08-26 DIAGNOSIS — Z436 Encounter for attention to other artificial openings of urinary tract: Secondary | ICD-10-CM | POA: Diagnosis not present

## 2018-08-26 DIAGNOSIS — E43 Unspecified severe protein-calorie malnutrition: Secondary | ICD-10-CM | POA: Diagnosis not present

## 2018-08-27 ENCOUNTER — Other Ambulatory Visit (HOSPITAL_COMMUNITY): Payer: Medicare Other

## 2018-08-27 DIAGNOSIS — Z436 Encounter for attention to other artificial openings of urinary tract: Secondary | ICD-10-CM | POA: Diagnosis not present

## 2018-08-27 DIAGNOSIS — Z452 Encounter for adjustment and management of vascular access device: Secondary | ICD-10-CM | POA: Diagnosis not present

## 2018-08-27 DIAGNOSIS — Z87440 Personal history of urinary (tract) infections: Secondary | ICD-10-CM | POA: Diagnosis not present

## 2018-08-27 DIAGNOSIS — E43 Unspecified severe protein-calorie malnutrition: Secondary | ICD-10-CM | POA: Diagnosis not present

## 2018-08-27 DIAGNOSIS — C775 Secondary and unspecified malignant neoplasm of intrapelvic lymph nodes: Secondary | ICD-10-CM | POA: Diagnosis not present

## 2018-08-27 DIAGNOSIS — C679 Malignant neoplasm of bladder, unspecified: Secondary | ICD-10-CM | POA: Diagnosis not present

## 2018-08-28 DIAGNOSIS — E43 Unspecified severe protein-calorie malnutrition: Secondary | ICD-10-CM | POA: Diagnosis not present

## 2018-08-28 DIAGNOSIS — Z87440 Personal history of urinary (tract) infections: Secondary | ICD-10-CM | POA: Diagnosis not present

## 2018-08-28 DIAGNOSIS — C775 Secondary and unspecified malignant neoplasm of intrapelvic lymph nodes: Secondary | ICD-10-CM | POA: Diagnosis not present

## 2018-08-28 DIAGNOSIS — Z452 Encounter for adjustment and management of vascular access device: Secondary | ICD-10-CM | POA: Diagnosis not present

## 2018-08-28 DIAGNOSIS — C679 Malignant neoplasm of bladder, unspecified: Secondary | ICD-10-CM | POA: Diagnosis not present

## 2018-08-28 DIAGNOSIS — Z436 Encounter for attention to other artificial openings of urinary tract: Secondary | ICD-10-CM | POA: Diagnosis not present

## 2018-08-29 DIAGNOSIS — E43 Unspecified severe protein-calorie malnutrition: Secondary | ICD-10-CM | POA: Diagnosis not present

## 2018-08-29 DIAGNOSIS — Z87440 Personal history of urinary (tract) infections: Secondary | ICD-10-CM | POA: Diagnosis not present

## 2018-08-29 DIAGNOSIS — Z452 Encounter for adjustment and management of vascular access device: Secondary | ICD-10-CM | POA: Diagnosis not present

## 2018-08-29 DIAGNOSIS — C679 Malignant neoplasm of bladder, unspecified: Secondary | ICD-10-CM | POA: Diagnosis not present

## 2018-08-29 DIAGNOSIS — C775 Secondary and unspecified malignant neoplasm of intrapelvic lymph nodes: Secondary | ICD-10-CM | POA: Diagnosis not present

## 2018-08-29 DIAGNOSIS — Z436 Encounter for attention to other artificial openings of urinary tract: Secondary | ICD-10-CM | POA: Diagnosis not present

## 2018-08-30 DIAGNOSIS — E43 Unspecified severe protein-calorie malnutrition: Secondary | ICD-10-CM | POA: Diagnosis not present

## 2018-08-30 DIAGNOSIS — C775 Secondary and unspecified malignant neoplasm of intrapelvic lymph nodes: Secondary | ICD-10-CM | POA: Diagnosis not present

## 2018-08-30 DIAGNOSIS — Z87440 Personal history of urinary (tract) infections: Secondary | ICD-10-CM | POA: Diagnosis not present

## 2018-08-30 DIAGNOSIS — Z452 Encounter for adjustment and management of vascular access device: Secondary | ICD-10-CM | POA: Diagnosis not present

## 2018-08-30 DIAGNOSIS — C679 Malignant neoplasm of bladder, unspecified: Secondary | ICD-10-CM | POA: Diagnosis not present

## 2018-08-30 DIAGNOSIS — Z436 Encounter for attention to other artificial openings of urinary tract: Secondary | ICD-10-CM | POA: Diagnosis not present

## 2018-08-31 DIAGNOSIS — Z452 Encounter for adjustment and management of vascular access device: Secondary | ICD-10-CM | POA: Diagnosis not present

## 2018-08-31 DIAGNOSIS — Z87440 Personal history of urinary (tract) infections: Secondary | ICD-10-CM | POA: Diagnosis not present

## 2018-08-31 DIAGNOSIS — E43 Unspecified severe protein-calorie malnutrition: Secondary | ICD-10-CM | POA: Diagnosis not present

## 2018-08-31 DIAGNOSIS — C679 Malignant neoplasm of bladder, unspecified: Secondary | ICD-10-CM | POA: Diagnosis not present

## 2018-08-31 DIAGNOSIS — C775 Secondary and unspecified malignant neoplasm of intrapelvic lymph nodes: Secondary | ICD-10-CM | POA: Diagnosis not present

## 2018-08-31 DIAGNOSIS — Z436 Encounter for attention to other artificial openings of urinary tract: Secondary | ICD-10-CM | POA: Diagnosis not present

## 2018-09-01 DIAGNOSIS — C775 Secondary and unspecified malignant neoplasm of intrapelvic lymph nodes: Secondary | ICD-10-CM | POA: Diagnosis not present

## 2018-09-01 DIAGNOSIS — Z452 Encounter for adjustment and management of vascular access device: Secondary | ICD-10-CM | POA: Diagnosis not present

## 2018-09-01 DIAGNOSIS — E43 Unspecified severe protein-calorie malnutrition: Secondary | ICD-10-CM | POA: Diagnosis not present

## 2018-09-01 DIAGNOSIS — Z436 Encounter for attention to other artificial openings of urinary tract: Secondary | ICD-10-CM | POA: Diagnosis not present

## 2018-09-01 DIAGNOSIS — C679 Malignant neoplasm of bladder, unspecified: Secondary | ICD-10-CM | POA: Diagnosis not present

## 2018-09-01 DIAGNOSIS — Z87440 Personal history of urinary (tract) infections: Secondary | ICD-10-CM | POA: Diagnosis not present

## 2018-09-02 DIAGNOSIS — Z436 Encounter for attention to other artificial openings of urinary tract: Secondary | ICD-10-CM | POA: Diagnosis not present

## 2018-09-02 DIAGNOSIS — E43 Unspecified severe protein-calorie malnutrition: Secondary | ICD-10-CM | POA: Diagnosis not present

## 2018-09-02 DIAGNOSIS — Z87440 Personal history of urinary (tract) infections: Secondary | ICD-10-CM | POA: Diagnosis not present

## 2018-09-02 DIAGNOSIS — Z452 Encounter for adjustment and management of vascular access device: Secondary | ICD-10-CM | POA: Diagnosis not present

## 2018-09-02 DIAGNOSIS — C679 Malignant neoplasm of bladder, unspecified: Secondary | ICD-10-CM | POA: Diagnosis not present

## 2018-09-02 DIAGNOSIS — C775 Secondary and unspecified malignant neoplasm of intrapelvic lymph nodes: Secondary | ICD-10-CM | POA: Diagnosis not present

## 2018-09-03 DIAGNOSIS — Z452 Encounter for adjustment and management of vascular access device: Secondary | ICD-10-CM | POA: Diagnosis not present

## 2018-09-03 DIAGNOSIS — E43 Unspecified severe protein-calorie malnutrition: Secondary | ICD-10-CM | POA: Diagnosis not present

## 2018-09-03 DIAGNOSIS — Z436 Encounter for attention to other artificial openings of urinary tract: Secondary | ICD-10-CM | POA: Diagnosis not present

## 2018-09-03 DIAGNOSIS — Z87440 Personal history of urinary (tract) infections: Secondary | ICD-10-CM | POA: Diagnosis not present

## 2018-09-03 DIAGNOSIS — C679 Malignant neoplasm of bladder, unspecified: Secondary | ICD-10-CM | POA: Diagnosis not present

## 2018-09-03 DIAGNOSIS — C775 Secondary and unspecified malignant neoplasm of intrapelvic lymph nodes: Secondary | ICD-10-CM | POA: Diagnosis not present

## 2018-09-04 DIAGNOSIS — Z436 Encounter for attention to other artificial openings of urinary tract: Secondary | ICD-10-CM | POA: Diagnosis not present

## 2018-09-04 DIAGNOSIS — E43 Unspecified severe protein-calorie malnutrition: Secondary | ICD-10-CM | POA: Diagnosis not present

## 2018-09-04 DIAGNOSIS — Z452 Encounter for adjustment and management of vascular access device: Secondary | ICD-10-CM | POA: Diagnosis not present

## 2018-09-04 DIAGNOSIS — C679 Malignant neoplasm of bladder, unspecified: Secondary | ICD-10-CM | POA: Diagnosis not present

## 2018-09-04 DIAGNOSIS — Z87440 Personal history of urinary (tract) infections: Secondary | ICD-10-CM | POA: Diagnosis not present

## 2018-09-04 DIAGNOSIS — C775 Secondary and unspecified malignant neoplasm of intrapelvic lymph nodes: Secondary | ICD-10-CM | POA: Diagnosis not present

## 2018-09-05 DIAGNOSIS — Z87440 Personal history of urinary (tract) infections: Secondary | ICD-10-CM | POA: Diagnosis not present

## 2018-09-05 DIAGNOSIS — Z436 Encounter for attention to other artificial openings of urinary tract: Secondary | ICD-10-CM | POA: Diagnosis not present

## 2018-09-05 DIAGNOSIS — C679 Malignant neoplasm of bladder, unspecified: Secondary | ICD-10-CM | POA: Diagnosis not present

## 2018-09-05 DIAGNOSIS — E43 Unspecified severe protein-calorie malnutrition: Secondary | ICD-10-CM | POA: Diagnosis not present

## 2018-09-05 DIAGNOSIS — C775 Secondary and unspecified malignant neoplasm of intrapelvic lymph nodes: Secondary | ICD-10-CM | POA: Diagnosis not present

## 2018-09-05 DIAGNOSIS — Z452 Encounter for adjustment and management of vascular access device: Secondary | ICD-10-CM | POA: Diagnosis not present

## 2018-09-06 DIAGNOSIS — Z452 Encounter for adjustment and management of vascular access device: Secondary | ICD-10-CM | POA: Diagnosis not present

## 2018-09-06 DIAGNOSIS — Z87440 Personal history of urinary (tract) infections: Secondary | ICD-10-CM | POA: Diagnosis not present

## 2018-09-06 DIAGNOSIS — C775 Secondary and unspecified malignant neoplasm of intrapelvic lymph nodes: Secondary | ICD-10-CM | POA: Diagnosis not present

## 2018-09-06 DIAGNOSIS — Z436 Encounter for attention to other artificial openings of urinary tract: Secondary | ICD-10-CM | POA: Diagnosis not present

## 2018-09-06 DIAGNOSIS — C679 Malignant neoplasm of bladder, unspecified: Secondary | ICD-10-CM | POA: Diagnosis not present

## 2018-09-06 DIAGNOSIS — E43 Unspecified severe protein-calorie malnutrition: Secondary | ICD-10-CM | POA: Diagnosis not present

## 2018-09-07 DIAGNOSIS — C679 Malignant neoplasm of bladder, unspecified: Secondary | ICD-10-CM | POA: Diagnosis not present

## 2018-09-07 DIAGNOSIS — Z87440 Personal history of urinary (tract) infections: Secondary | ICD-10-CM | POA: Diagnosis not present

## 2018-09-07 DIAGNOSIS — C775 Secondary and unspecified malignant neoplasm of intrapelvic lymph nodes: Secondary | ICD-10-CM | POA: Diagnosis not present

## 2018-09-07 DIAGNOSIS — Z436 Encounter for attention to other artificial openings of urinary tract: Secondary | ICD-10-CM | POA: Diagnosis not present

## 2018-09-07 DIAGNOSIS — Z452 Encounter for adjustment and management of vascular access device: Secondary | ICD-10-CM | POA: Diagnosis not present

## 2018-09-07 DIAGNOSIS — E43 Unspecified severe protein-calorie malnutrition: Secondary | ICD-10-CM | POA: Diagnosis not present

## 2018-09-08 ENCOUNTER — Other Ambulatory Visit (HOSPITAL_COMMUNITY): Payer: Medicare Other

## 2018-09-08 DIAGNOSIS — I25729 Atherosclerosis of autologous artery coronary artery bypass graft(s) with unspecified angina pectoris: Secondary | ICD-10-CM | POA: Diagnosis not present

## 2018-09-08 DIAGNOSIS — E781 Pure hyperglyceridemia: Secondary | ICD-10-CM | POA: Diagnosis not present

## 2018-09-08 DIAGNOSIS — Z951 Presence of aortocoronary bypass graft: Secondary | ICD-10-CM | POA: Diagnosis not present

## 2018-09-08 DIAGNOSIS — C679 Malignant neoplasm of bladder, unspecified: Secondary | ICD-10-CM | POA: Diagnosis not present

## 2018-09-08 DIAGNOSIS — E43 Unspecified severe protein-calorie malnutrition: Secondary | ICD-10-CM | POA: Diagnosis not present

## 2018-09-08 DIAGNOSIS — Z87440 Personal history of urinary (tract) infections: Secondary | ICD-10-CM | POA: Diagnosis not present

## 2018-09-08 DIAGNOSIS — Z451 Encounter for adjustment and management of infusion pump: Secondary | ICD-10-CM | POA: Diagnosis not present

## 2018-09-08 DIAGNOSIS — E1122 Type 2 diabetes mellitus with diabetic chronic kidney disease: Secondary | ICD-10-CM | POA: Diagnosis not present

## 2018-09-08 DIAGNOSIS — Z436 Encounter for attention to other artificial openings of urinary tract: Secondary | ICD-10-CM | POA: Diagnosis not present

## 2018-09-08 DIAGNOSIS — Z452 Encounter for adjustment and management of vascular access device: Secondary | ICD-10-CM | POA: Diagnosis not present

## 2018-09-08 DIAGNOSIS — I951 Orthostatic hypotension: Secondary | ICD-10-CM | POA: Diagnosis not present

## 2018-09-08 DIAGNOSIS — Z87442 Personal history of urinary calculi: Secondary | ICD-10-CM | POA: Diagnosis not present

## 2018-09-08 DIAGNOSIS — C775 Secondary and unspecified malignant neoplasm of intrapelvic lymph nodes: Secondary | ICD-10-CM | POA: Diagnosis not present

## 2018-09-08 DIAGNOSIS — Z8619 Personal history of other infectious and parasitic diseases: Secondary | ICD-10-CM | POA: Diagnosis not present

## 2018-09-08 DIAGNOSIS — Z87718 Personal history of other specified (corrected) congenital malformations of genitourinary system: Secondary | ICD-10-CM | POA: Diagnosis not present

## 2018-09-08 DIAGNOSIS — N182 Chronic kidney disease, stage 2 (mild): Secondary | ICD-10-CM | POA: Diagnosis not present

## 2018-09-09 DIAGNOSIS — E43 Unspecified severe protein-calorie malnutrition: Secondary | ICD-10-CM | POA: Diagnosis not present

## 2018-09-09 DIAGNOSIS — Z436 Encounter for attention to other artificial openings of urinary tract: Secondary | ICD-10-CM | POA: Diagnosis not present

## 2018-09-09 DIAGNOSIS — C775 Secondary and unspecified malignant neoplasm of intrapelvic lymph nodes: Secondary | ICD-10-CM | POA: Diagnosis not present

## 2018-09-09 DIAGNOSIS — Z452 Encounter for adjustment and management of vascular access device: Secondary | ICD-10-CM | POA: Diagnosis not present

## 2018-09-09 DIAGNOSIS — Z451 Encounter for adjustment and management of infusion pump: Secondary | ICD-10-CM | POA: Diagnosis not present

## 2018-09-09 DIAGNOSIS — C679 Malignant neoplasm of bladder, unspecified: Secondary | ICD-10-CM | POA: Diagnosis not present

## 2018-09-10 DIAGNOSIS — E43 Unspecified severe protein-calorie malnutrition: Secondary | ICD-10-CM | POA: Diagnosis not present

## 2018-09-10 DIAGNOSIS — Z436 Encounter for attention to other artificial openings of urinary tract: Secondary | ICD-10-CM | POA: Diagnosis not present

## 2018-09-10 DIAGNOSIS — Z452 Encounter for adjustment and management of vascular access device: Secondary | ICD-10-CM | POA: Diagnosis not present

## 2018-09-10 DIAGNOSIS — Z451 Encounter for adjustment and management of infusion pump: Secondary | ICD-10-CM | POA: Diagnosis not present

## 2018-09-10 DIAGNOSIS — C679 Malignant neoplasm of bladder, unspecified: Secondary | ICD-10-CM | POA: Diagnosis not present

## 2018-09-10 DIAGNOSIS — C775 Secondary and unspecified malignant neoplasm of intrapelvic lymph nodes: Secondary | ICD-10-CM | POA: Diagnosis not present

## 2018-09-11 DIAGNOSIS — Z452 Encounter for adjustment and management of vascular access device: Secondary | ICD-10-CM | POA: Diagnosis not present

## 2018-09-11 DIAGNOSIS — C679 Malignant neoplasm of bladder, unspecified: Secondary | ICD-10-CM | POA: Diagnosis not present

## 2018-09-11 DIAGNOSIS — Z436 Encounter for attention to other artificial openings of urinary tract: Secondary | ICD-10-CM | POA: Diagnosis not present

## 2018-09-11 DIAGNOSIS — E43 Unspecified severe protein-calorie malnutrition: Secondary | ICD-10-CM | POA: Diagnosis not present

## 2018-09-11 DIAGNOSIS — C775 Secondary and unspecified malignant neoplasm of intrapelvic lymph nodes: Secondary | ICD-10-CM | POA: Diagnosis not present

## 2018-09-11 DIAGNOSIS — Z451 Encounter for adjustment and management of infusion pump: Secondary | ICD-10-CM | POA: Diagnosis not present

## 2018-09-13 DIAGNOSIS — C775 Secondary and unspecified malignant neoplasm of intrapelvic lymph nodes: Secondary | ICD-10-CM | POA: Diagnosis not present

## 2018-09-13 DIAGNOSIS — Z452 Encounter for adjustment and management of vascular access device: Secondary | ICD-10-CM | POA: Diagnosis not present

## 2018-09-13 DIAGNOSIS — Z451 Encounter for adjustment and management of infusion pump: Secondary | ICD-10-CM | POA: Diagnosis not present

## 2018-09-13 DIAGNOSIS — C679 Malignant neoplasm of bladder, unspecified: Secondary | ICD-10-CM | POA: Diagnosis not present

## 2018-09-13 DIAGNOSIS — E43 Unspecified severe protein-calorie malnutrition: Secondary | ICD-10-CM | POA: Diagnosis not present

## 2018-09-13 DIAGNOSIS — Z436 Encounter for attention to other artificial openings of urinary tract: Secondary | ICD-10-CM | POA: Diagnosis not present

## 2018-09-14 DIAGNOSIS — E43 Unspecified severe protein-calorie malnutrition: Secondary | ICD-10-CM | POA: Diagnosis not present

## 2018-09-14 DIAGNOSIS — C679 Malignant neoplasm of bladder, unspecified: Secondary | ICD-10-CM | POA: Diagnosis not present

## 2018-09-14 DIAGNOSIS — Z436 Encounter for attention to other artificial openings of urinary tract: Secondary | ICD-10-CM | POA: Diagnosis not present

## 2018-09-14 DIAGNOSIS — Z451 Encounter for adjustment and management of infusion pump: Secondary | ICD-10-CM | POA: Diagnosis not present

## 2018-09-14 DIAGNOSIS — C775 Secondary and unspecified malignant neoplasm of intrapelvic lymph nodes: Secondary | ICD-10-CM | POA: Diagnosis not present

## 2018-09-14 DIAGNOSIS — Z452 Encounter for adjustment and management of vascular access device: Secondary | ICD-10-CM | POA: Diagnosis not present

## 2018-09-15 ENCOUNTER — Other Ambulatory Visit (HOSPITAL_COMMUNITY): Payer: Self-pay | Admitting: Radiology

## 2018-09-15 DIAGNOSIS — C679 Malignant neoplasm of bladder, unspecified: Secondary | ICD-10-CM | POA: Diagnosis not present

## 2018-09-15 DIAGNOSIS — Z452 Encounter for adjustment and management of vascular access device: Secondary | ICD-10-CM | POA: Diagnosis not present

## 2018-09-15 DIAGNOSIS — N131 Hydronephrosis with ureteral stricture, not elsewhere classified: Secondary | ICD-10-CM

## 2018-09-15 DIAGNOSIS — E43 Unspecified severe protein-calorie malnutrition: Secondary | ICD-10-CM | POA: Diagnosis not present

## 2018-09-15 DIAGNOSIS — C775 Secondary and unspecified malignant neoplasm of intrapelvic lymph nodes: Secondary | ICD-10-CM | POA: Diagnosis not present

## 2018-09-15 DIAGNOSIS — Z451 Encounter for adjustment and management of infusion pump: Secondary | ICD-10-CM | POA: Diagnosis not present

## 2018-09-15 DIAGNOSIS — Z436 Encounter for attention to other artificial openings of urinary tract: Secondary | ICD-10-CM | POA: Diagnosis not present

## 2018-09-16 ENCOUNTER — Other Ambulatory Visit: Payer: Self-pay

## 2018-09-16 ENCOUNTER — Other Ambulatory Visit (HOSPITAL_COMMUNITY): Payer: Self-pay | Admitting: Radiology

## 2018-09-16 ENCOUNTER — Encounter (HOSPITAL_COMMUNITY): Payer: Self-pay | Admitting: Interventional Radiology

## 2018-09-16 ENCOUNTER — Ambulatory Visit (HOSPITAL_COMMUNITY)
Admission: RE | Admit: 2018-09-16 | Discharge: 2018-09-16 | Disposition: A | Discharge status: Home / Self Care | Source: Ambulatory Visit | Attending: Radiology | Admitting: Radiology

## 2018-09-16 DIAGNOSIS — R52 Pain, unspecified: Secondary | ICD-10-CM | POA: Diagnosis not present

## 2018-09-16 DIAGNOSIS — Z452 Encounter for adjustment and management of vascular access device: Secondary | ICD-10-CM | POA: Diagnosis not present

## 2018-09-16 DIAGNOSIS — E43 Unspecified severe protein-calorie malnutrition: Secondary | ICD-10-CM | POA: Diagnosis not present

## 2018-09-16 DIAGNOSIS — N131 Hydronephrosis with ureteral stricture, not elsewhere classified: Secondary | ICD-10-CM

## 2018-09-16 DIAGNOSIS — M2550 Pain in unspecified joint: Secondary | ICD-10-CM | POA: Diagnosis not present

## 2018-09-16 DIAGNOSIS — Z436 Encounter for attention to other artificial openings of urinary tract: Secondary | ICD-10-CM | POA: Diagnosis not present

## 2018-09-16 DIAGNOSIS — Z7401 Bed confinement status: Secondary | ICD-10-CM | POA: Diagnosis not present

## 2018-09-16 DIAGNOSIS — Z451 Encounter for adjustment and management of infusion pump: Secondary | ICD-10-CM | POA: Diagnosis not present

## 2018-09-16 DIAGNOSIS — C775 Secondary and unspecified malignant neoplasm of intrapelvic lymph nodes: Secondary | ICD-10-CM | POA: Diagnosis not present

## 2018-09-16 DIAGNOSIS — C679 Malignant neoplasm of bladder, unspecified: Secondary | ICD-10-CM | POA: Diagnosis not present

## 2018-09-16 DIAGNOSIS — R000 Tachycardia, unspecified: Secondary | ICD-10-CM | POA: Diagnosis not present

## 2018-09-16 DIAGNOSIS — R58 Hemorrhage, not elsewhere classified: Secondary | ICD-10-CM | POA: Diagnosis not present

## 2018-09-16 HISTORY — PX: IR NEPHROSTOMY EXCHANGE LEFT: IMG6069

## 2018-09-16 HISTORY — PX: IR NEPHROSTOMY EXCHANGE RIGHT: IMG6070

## 2018-09-16 MED ORDER — LIDOCAINE HCL 1 % IJ SOLN
INTRAMUSCULAR | Status: AC
  Filled 2018-09-16: qty 20

## 2018-09-16 MED ORDER — IOHEXOL 300 MG/ML  SOLN
50.0000 mL | Freq: Once | INTRAMUSCULAR | Status: AC | PRN
  Administered 2018-09-16: 16:00:00 15 mL

## 2018-09-17 ENCOUNTER — Other Ambulatory Visit (HOSPITAL_COMMUNITY)

## 2018-09-17 DIAGNOSIS — Z452 Encounter for adjustment and management of vascular access device: Secondary | ICD-10-CM | POA: Diagnosis not present

## 2018-09-17 DIAGNOSIS — Z451 Encounter for adjustment and management of infusion pump: Secondary | ICD-10-CM | POA: Diagnosis not present

## 2018-09-17 DIAGNOSIS — Z436 Encounter for attention to other artificial openings of urinary tract: Secondary | ICD-10-CM | POA: Diagnosis not present

## 2018-09-17 DIAGNOSIS — C679 Malignant neoplasm of bladder, unspecified: Secondary | ICD-10-CM | POA: Diagnosis not present

## 2018-09-17 DIAGNOSIS — C775 Secondary and unspecified malignant neoplasm of intrapelvic lymph nodes: Secondary | ICD-10-CM | POA: Diagnosis not present

## 2018-09-17 DIAGNOSIS — E43 Unspecified severe protein-calorie malnutrition: Secondary | ICD-10-CM | POA: Diagnosis not present

## 2018-09-18 DIAGNOSIS — C775 Secondary and unspecified malignant neoplasm of intrapelvic lymph nodes: Secondary | ICD-10-CM | POA: Diagnosis not present

## 2018-09-18 DIAGNOSIS — Z451 Encounter for adjustment and management of infusion pump: Secondary | ICD-10-CM | POA: Diagnosis not present

## 2018-09-18 DIAGNOSIS — C679 Malignant neoplasm of bladder, unspecified: Secondary | ICD-10-CM | POA: Diagnosis not present

## 2018-09-18 DIAGNOSIS — Z452 Encounter for adjustment and management of vascular access device: Secondary | ICD-10-CM | POA: Diagnosis not present

## 2018-09-18 DIAGNOSIS — E43 Unspecified severe protein-calorie malnutrition: Secondary | ICD-10-CM | POA: Diagnosis not present

## 2018-09-18 DIAGNOSIS — Z436 Encounter for attention to other artificial openings of urinary tract: Secondary | ICD-10-CM | POA: Diagnosis not present

## 2018-09-19 DIAGNOSIS — C775 Secondary and unspecified malignant neoplasm of intrapelvic lymph nodes: Secondary | ICD-10-CM | POA: Diagnosis not present

## 2018-09-19 DIAGNOSIS — Z436 Encounter for attention to other artificial openings of urinary tract: Secondary | ICD-10-CM | POA: Diagnosis not present

## 2018-09-19 DIAGNOSIS — Z451 Encounter for adjustment and management of infusion pump: Secondary | ICD-10-CM | POA: Diagnosis not present

## 2018-09-19 DIAGNOSIS — Z452 Encounter for adjustment and management of vascular access device: Secondary | ICD-10-CM | POA: Diagnosis not present

## 2018-09-19 DIAGNOSIS — E43 Unspecified severe protein-calorie malnutrition: Secondary | ICD-10-CM | POA: Diagnosis not present

## 2018-09-19 DIAGNOSIS — C679 Malignant neoplasm of bladder, unspecified: Secondary | ICD-10-CM | POA: Diagnosis not present

## 2018-09-20 DIAGNOSIS — C775 Secondary and unspecified malignant neoplasm of intrapelvic lymph nodes: Secondary | ICD-10-CM | POA: Diagnosis not present

## 2018-09-20 DIAGNOSIS — Z451 Encounter for adjustment and management of infusion pump: Secondary | ICD-10-CM | POA: Diagnosis not present

## 2018-09-20 DIAGNOSIS — Z436 Encounter for attention to other artificial openings of urinary tract: Secondary | ICD-10-CM | POA: Diagnosis not present

## 2018-09-20 DIAGNOSIS — E43 Unspecified severe protein-calorie malnutrition: Secondary | ICD-10-CM | POA: Diagnosis not present

## 2018-09-20 DIAGNOSIS — C679 Malignant neoplasm of bladder, unspecified: Secondary | ICD-10-CM | POA: Diagnosis not present

## 2018-09-20 DIAGNOSIS — Z452 Encounter for adjustment and management of vascular access device: Secondary | ICD-10-CM | POA: Diagnosis not present

## 2018-09-21 DIAGNOSIS — C679 Malignant neoplasm of bladder, unspecified: Secondary | ICD-10-CM | POA: Diagnosis not present

## 2018-09-21 DIAGNOSIS — Z452 Encounter for adjustment and management of vascular access device: Secondary | ICD-10-CM | POA: Diagnosis not present

## 2018-09-21 DIAGNOSIS — C775 Secondary and unspecified malignant neoplasm of intrapelvic lymph nodes: Secondary | ICD-10-CM | POA: Diagnosis not present

## 2018-09-21 DIAGNOSIS — E43 Unspecified severe protein-calorie malnutrition: Secondary | ICD-10-CM | POA: Diagnosis not present

## 2018-09-21 DIAGNOSIS — Z451 Encounter for adjustment and management of infusion pump: Secondary | ICD-10-CM | POA: Diagnosis not present

## 2018-09-21 DIAGNOSIS — Z436 Encounter for attention to other artificial openings of urinary tract: Secondary | ICD-10-CM | POA: Diagnosis not present

## 2018-09-22 DIAGNOSIS — E43 Unspecified severe protein-calorie malnutrition: Secondary | ICD-10-CM | POA: Diagnosis not present

## 2018-09-22 DIAGNOSIS — C679 Malignant neoplasm of bladder, unspecified: Secondary | ICD-10-CM | POA: Diagnosis not present

## 2018-09-22 DIAGNOSIS — C775 Secondary and unspecified malignant neoplasm of intrapelvic lymph nodes: Secondary | ICD-10-CM | POA: Diagnosis not present

## 2018-09-22 DIAGNOSIS — Z452 Encounter for adjustment and management of vascular access device: Secondary | ICD-10-CM | POA: Diagnosis not present

## 2018-09-22 DIAGNOSIS — Z451 Encounter for adjustment and management of infusion pump: Secondary | ICD-10-CM | POA: Diagnosis not present

## 2018-09-22 DIAGNOSIS — Z436 Encounter for attention to other artificial openings of urinary tract: Secondary | ICD-10-CM | POA: Diagnosis not present

## 2018-09-23 DIAGNOSIS — C775 Secondary and unspecified malignant neoplasm of intrapelvic lymph nodes: Secondary | ICD-10-CM | POA: Diagnosis not present

## 2018-09-23 DIAGNOSIS — Z436 Encounter for attention to other artificial openings of urinary tract: Secondary | ICD-10-CM | POA: Diagnosis not present

## 2018-09-23 DIAGNOSIS — Z452 Encounter for adjustment and management of vascular access device: Secondary | ICD-10-CM | POA: Diagnosis not present

## 2018-09-23 DIAGNOSIS — C679 Malignant neoplasm of bladder, unspecified: Secondary | ICD-10-CM | POA: Diagnosis not present

## 2018-09-23 DIAGNOSIS — Z451 Encounter for adjustment and management of infusion pump: Secondary | ICD-10-CM | POA: Diagnosis not present

## 2018-09-23 DIAGNOSIS — E43 Unspecified severe protein-calorie malnutrition: Secondary | ICD-10-CM | POA: Diagnosis not present

## 2018-09-24 DIAGNOSIS — C679 Malignant neoplasm of bladder, unspecified: Secondary | ICD-10-CM | POA: Diagnosis not present

## 2018-09-24 DIAGNOSIS — Z451 Encounter for adjustment and management of infusion pump: Secondary | ICD-10-CM | POA: Diagnosis not present

## 2018-09-24 DIAGNOSIS — Z436 Encounter for attention to other artificial openings of urinary tract: Secondary | ICD-10-CM | POA: Diagnosis not present

## 2018-09-24 DIAGNOSIS — C775 Secondary and unspecified malignant neoplasm of intrapelvic lymph nodes: Secondary | ICD-10-CM | POA: Diagnosis not present

## 2018-09-24 DIAGNOSIS — E43 Unspecified severe protein-calorie malnutrition: Secondary | ICD-10-CM | POA: Diagnosis not present

## 2018-09-24 DIAGNOSIS — Z452 Encounter for adjustment and management of vascular access device: Secondary | ICD-10-CM | POA: Diagnosis not present

## 2018-09-25 DIAGNOSIS — Z451 Encounter for adjustment and management of infusion pump: Secondary | ICD-10-CM | POA: Diagnosis not present

## 2018-09-25 DIAGNOSIS — C679 Malignant neoplasm of bladder, unspecified: Secondary | ICD-10-CM | POA: Diagnosis not present

## 2018-09-25 DIAGNOSIS — Z436 Encounter for attention to other artificial openings of urinary tract: Secondary | ICD-10-CM | POA: Diagnosis not present

## 2018-09-25 DIAGNOSIS — Z452 Encounter for adjustment and management of vascular access device: Secondary | ICD-10-CM | POA: Diagnosis not present

## 2018-09-25 DIAGNOSIS — E43 Unspecified severe protein-calorie malnutrition: Secondary | ICD-10-CM | POA: Diagnosis not present

## 2018-09-25 DIAGNOSIS — C775 Secondary and unspecified malignant neoplasm of intrapelvic lymph nodes: Secondary | ICD-10-CM | POA: Diagnosis not present

## 2018-09-26 DIAGNOSIS — Z451 Encounter for adjustment and management of infusion pump: Secondary | ICD-10-CM | POA: Diagnosis not present

## 2018-09-26 DIAGNOSIS — E43 Unspecified severe protein-calorie malnutrition: Secondary | ICD-10-CM | POA: Diagnosis not present

## 2018-09-26 DIAGNOSIS — Z452 Encounter for adjustment and management of vascular access device: Secondary | ICD-10-CM | POA: Diagnosis not present

## 2018-09-26 DIAGNOSIS — C679 Malignant neoplasm of bladder, unspecified: Secondary | ICD-10-CM | POA: Diagnosis not present

## 2018-09-26 DIAGNOSIS — C775 Secondary and unspecified malignant neoplasm of intrapelvic lymph nodes: Secondary | ICD-10-CM | POA: Diagnosis not present

## 2018-09-26 DIAGNOSIS — Z436 Encounter for attention to other artificial openings of urinary tract: Secondary | ICD-10-CM | POA: Diagnosis not present

## 2018-09-27 DIAGNOSIS — Z436 Encounter for attention to other artificial openings of urinary tract: Secondary | ICD-10-CM | POA: Diagnosis not present

## 2018-09-27 DIAGNOSIS — E43 Unspecified severe protein-calorie malnutrition: Secondary | ICD-10-CM | POA: Diagnosis not present

## 2018-09-27 DIAGNOSIS — C775 Secondary and unspecified malignant neoplasm of intrapelvic lymph nodes: Secondary | ICD-10-CM | POA: Diagnosis not present

## 2018-09-27 DIAGNOSIS — C679 Malignant neoplasm of bladder, unspecified: Secondary | ICD-10-CM | POA: Diagnosis not present

## 2018-09-27 DIAGNOSIS — Z451 Encounter for adjustment and management of infusion pump: Secondary | ICD-10-CM | POA: Diagnosis not present

## 2018-09-27 DIAGNOSIS — Z452 Encounter for adjustment and management of vascular access device: Secondary | ICD-10-CM | POA: Diagnosis not present

## 2018-09-28 DIAGNOSIS — C775 Secondary and unspecified malignant neoplasm of intrapelvic lymph nodes: Secondary | ICD-10-CM | POA: Diagnosis not present

## 2018-09-28 DIAGNOSIS — Z452 Encounter for adjustment and management of vascular access device: Secondary | ICD-10-CM | POA: Diagnosis not present

## 2018-09-28 DIAGNOSIS — Z436 Encounter for attention to other artificial openings of urinary tract: Secondary | ICD-10-CM | POA: Diagnosis not present

## 2018-09-28 DIAGNOSIS — Z451 Encounter for adjustment and management of infusion pump: Secondary | ICD-10-CM | POA: Diagnosis not present

## 2018-09-28 DIAGNOSIS — C679 Malignant neoplasm of bladder, unspecified: Secondary | ICD-10-CM | POA: Diagnosis not present

## 2018-09-28 DIAGNOSIS — E43 Unspecified severe protein-calorie malnutrition: Secondary | ICD-10-CM | POA: Diagnosis not present

## 2018-10-08 DEATH — deceased

## 2020-01-27 IMAGING — XA IR EXCHANGE NEPHROSTOMY LEFT
3 series · 13 of 15 positions shown · non-contrast
Comparison: None.

INDICATION: 57-year-old with high-grade urothelial carcinoma and bilateral
nephrostomy tubes. Patient presents for routine nephrostomy tube
exchanges. Patient requires frequent exchanges due to rapid sediment
buildup within the nephrostomy tubes. In particular, he has problems
with the left nephrostomy tube. He is currently flushing the left
nephrostomy tube 2 to 3 times a day.

EXAM:
EXCHANGE BILATERAL NEPHROSTOMY TUBES WITH FLUOROSCOPY

[Series 1: fl - angio · 3 of 51 frames shown (1 of 2)]
[frame 8/51]
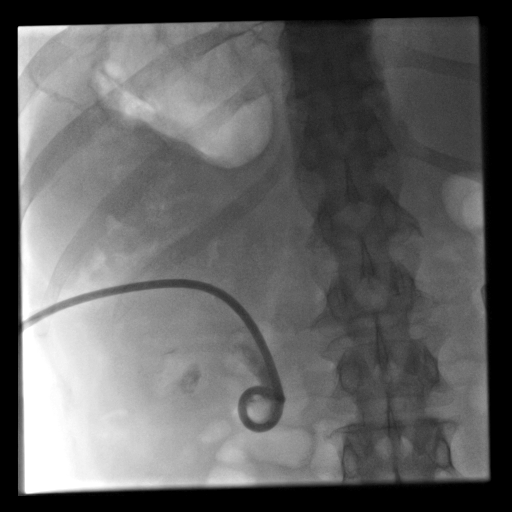
[frame 26/51]
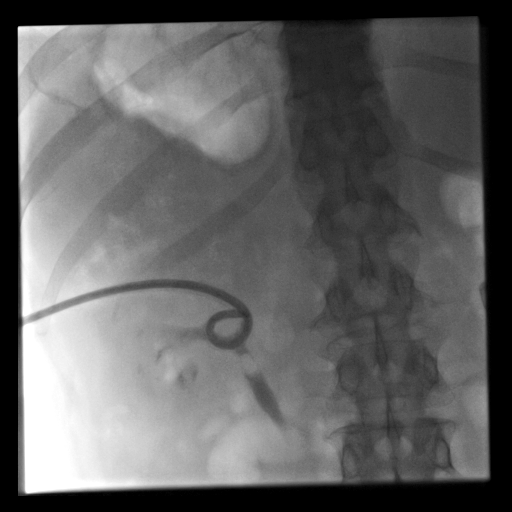
[frame 41/51]
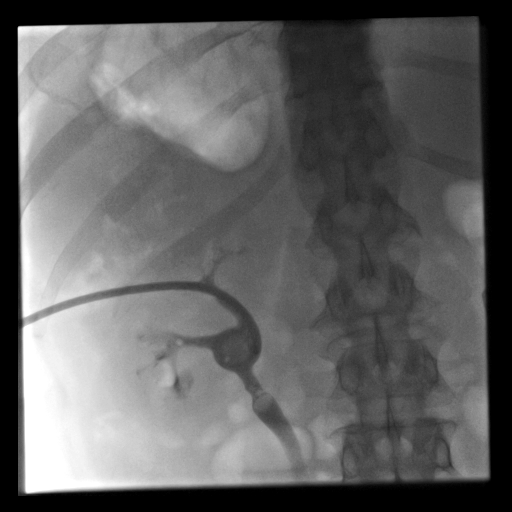

[Series 4: fl - angio · 4 of 52 frames shown (2 of 2)]
[frame 8/52]
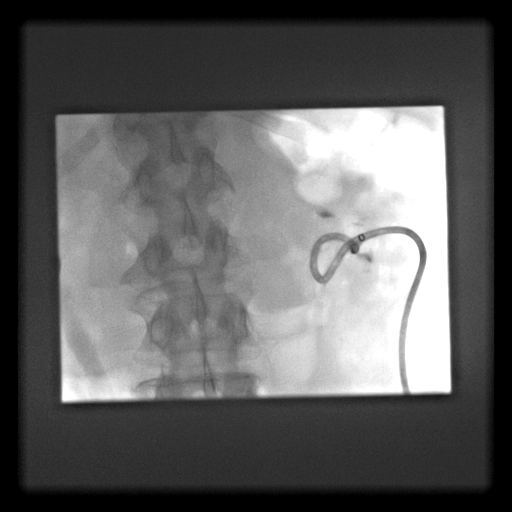
[frame 15/52]
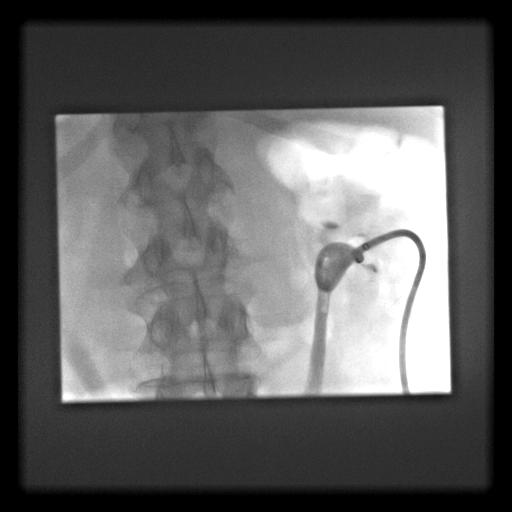
[frame 27/52]
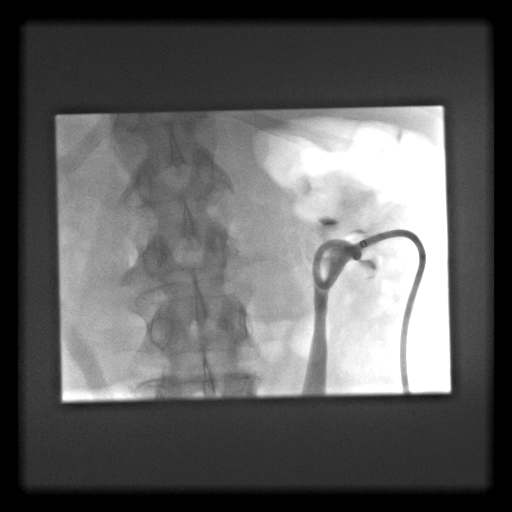
[frame 45/52]
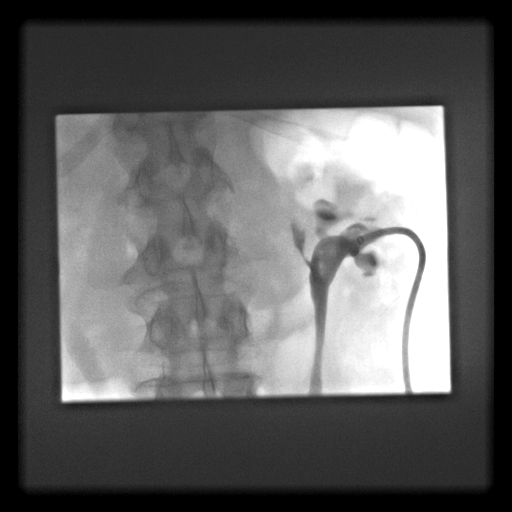

[Series 300: tube placements · 6 of 7 slices shown]
[im 1/7]
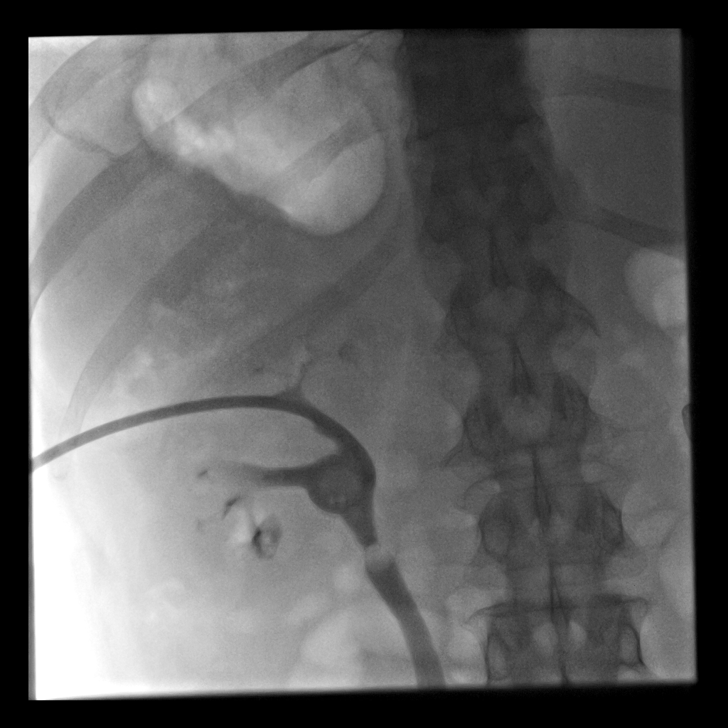
[im 2/7]
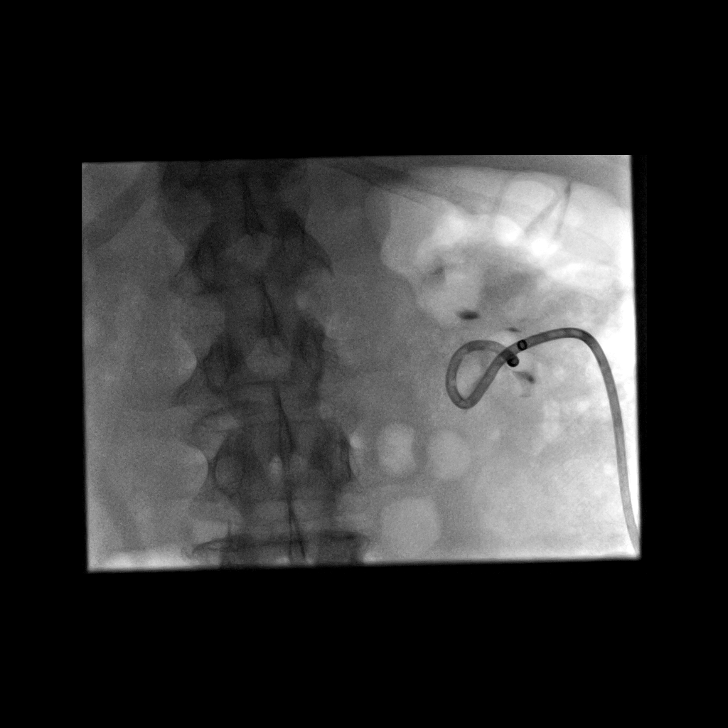
[im 3/7]
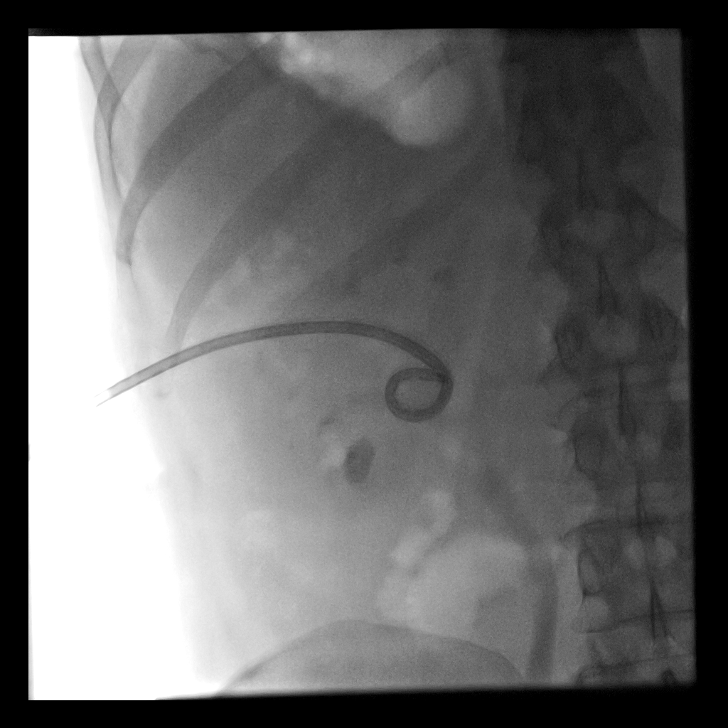
[im 5/7]
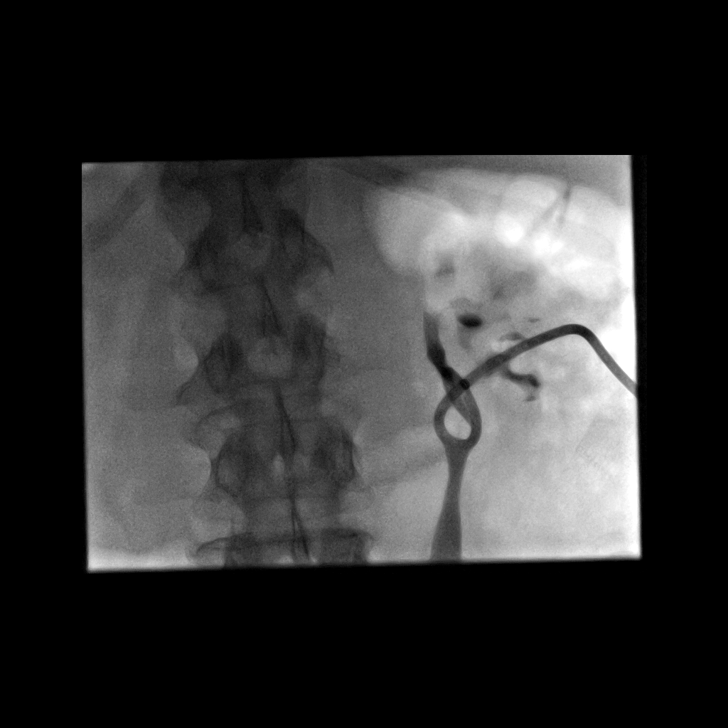
[im 6/7]
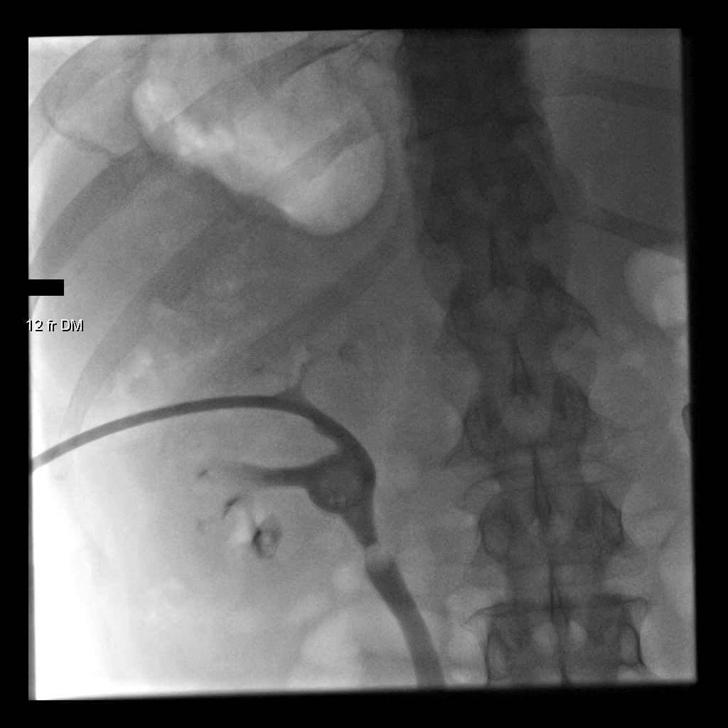
[im 7/7]
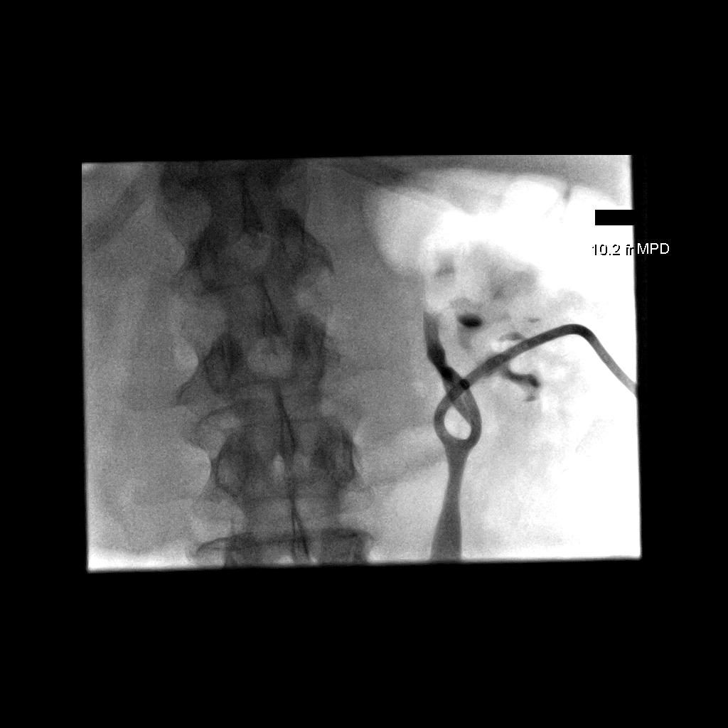

[13 of 15 positions shown; findings below may reference images not displayed]

MEDICATIONS:
None

ANESTHESIA/SEDATION:
None

CONTRAST:  20mL OMNIPAQUE IOHEXOL 300 MG/ML SOLN - administered into
the collecting system(s)

FLUOROSCOPY TIME:  Fluoroscopy Time: 2 minutes and 30 seconds, 10
mGy

COMPLICATIONS:
None immediate.

PROCEDURE:
The procedure was explained to the patient. The risks and benefits
of the procedure were discussed and the patient's questions were
addressed. Informed consent was obtained from the patient.

Bilateral nephrostomy tubes were prepped and draped in sterile
fashion. Maximal barrier sterile technique was utilized including
caps, mask, sterile gowns, sterile gloves, sterile drape, hand
hygiene and skin antiseptic.

Left nephrostomy tube was injected with contrast and confirm patency
of the tube. The retention suture was removed. Catheter was cut and
removed over a Glidewire. New 12 Gritzky Gusani catheter was
advanced over the wire and reconstituted in the left renal pelvis.
Skin was anesthetized with 1% lidocaine and the catheter was sutured
to skin. Catheter was injected with contrast to confirm correct
placement. Catheter was flushed and attached to gravity bag.

Right nephrostomy tube was injected with contrast to confirm
patency. Retention suture was removed. Catheter was cut and removed
over a Bentson wire. New 10.2 French multipurpose drain was
reconstituted in the renal pelvis. Contrast injection confirmed
placement. Tube was flushed with saline and attached to gravity bag.
Skin was anesthetized with 1% lidocaine and the catheter was sutured
to skin.
FINDINGS: Left nephrostomy tube is well positioned in the small left renal
pelvis.

Right nephrostomy tube is positioned in the right renal pelvis.
IMPRESSION: Successful exchange of bilateral nephrostomy tubes with fluoroscopy.

Plan for routine exchange in 4 weeks.

## 2020-02-15 IMAGING — XA IR EXCHANGE NEPHROSTOMY LEFT
1 series · 7 of 7 positions shown · non-contrast
Comparison: Multiple previous fluoroscopic guided exchanges, most
recently 05/21/2018

INDICATION: History of high-grade urothelial carcinoma with chronic bilateral
nephrostomy catheters.
TECHNIQUE: Informed written consent was obtained from the patient after a
discussion of the risks, benefits and alternatives to treatment.
Questions regarding the procedure were encouraged and answered. A
timeout was performed prior to the initiation of the procedure.

[Series 300: ir nephrostomy exchange left · 7 of 7 slices shown]
[im 1/7]
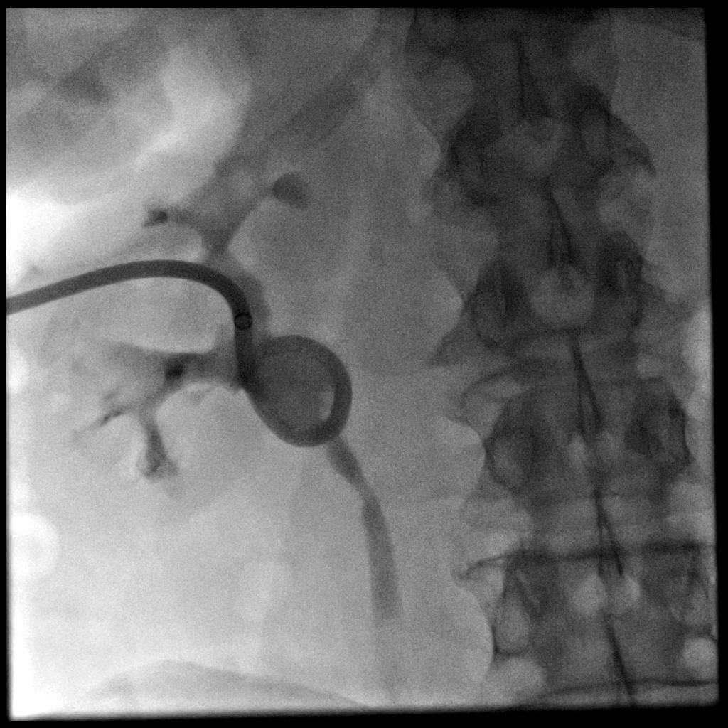
[im 2/7]
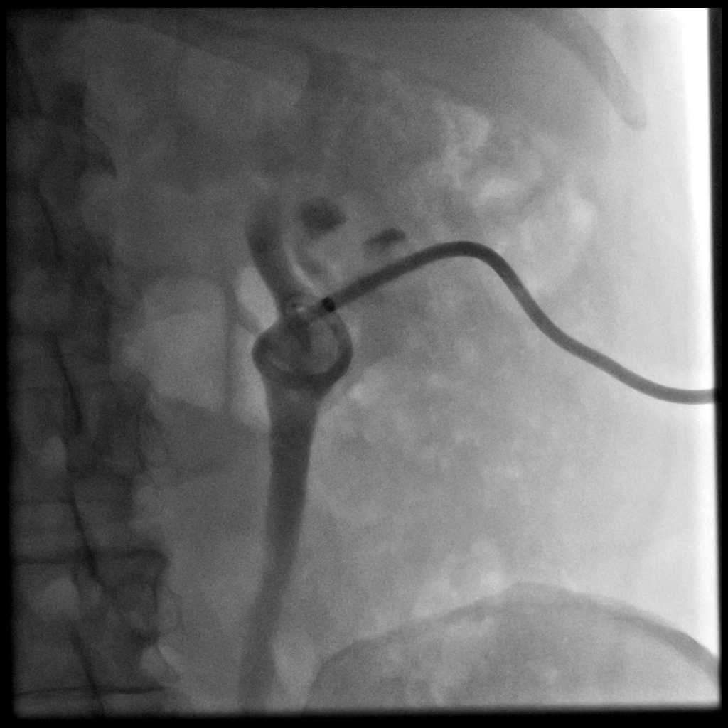
[im 3/7]
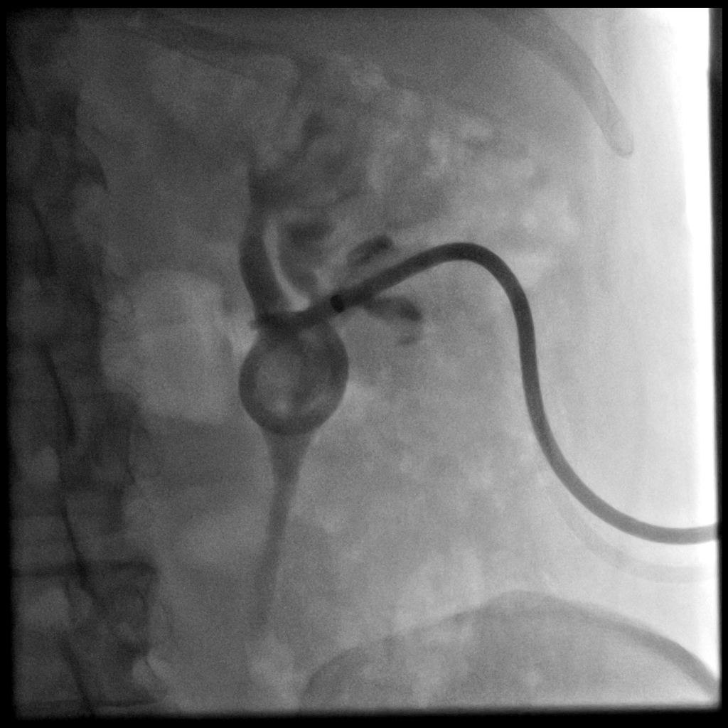
[im 4/7]
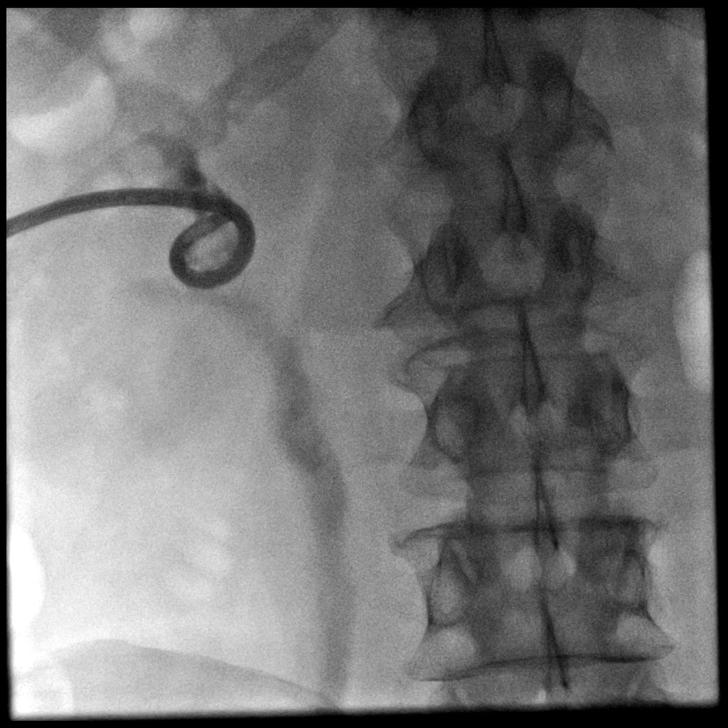
[im 5/7]
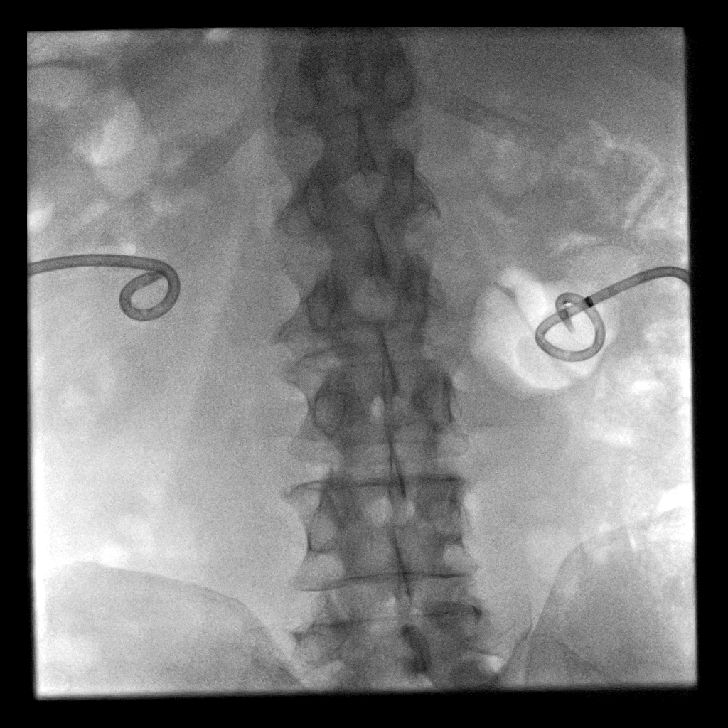
[im 6/7]
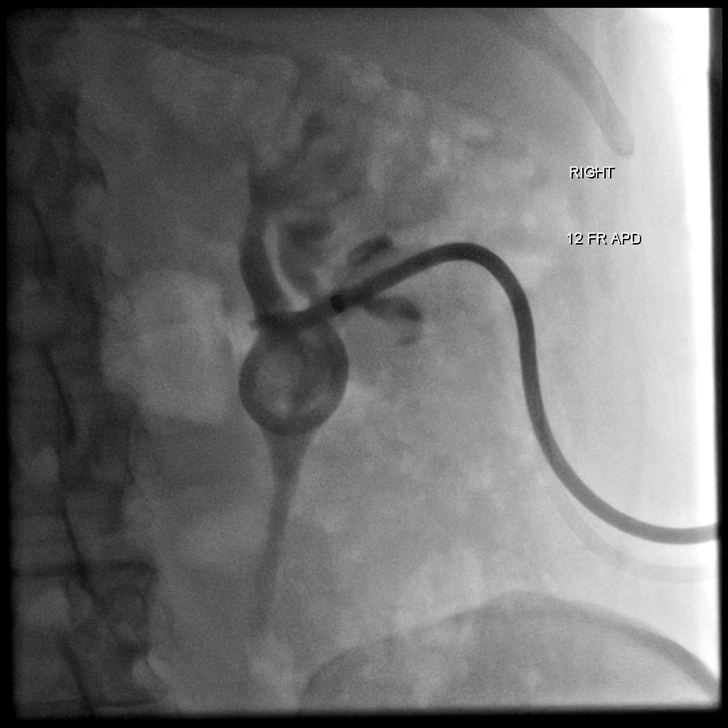
[im 7/7]
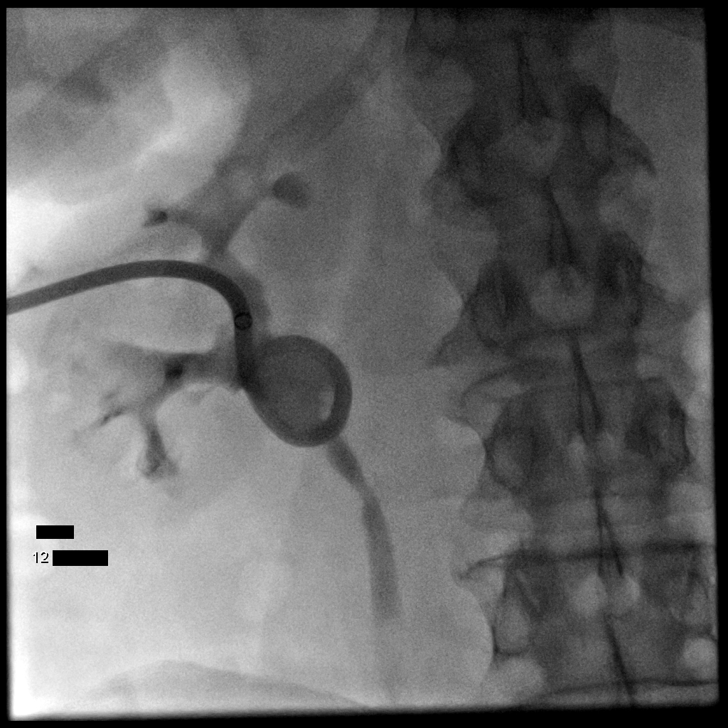

[7 of 7 positions shown; findings below may reference images not displayed]

Unfortunately, the patient has experienced several episodes of early
recurrent occlusion of the left-sided 12 Dk Blind
drainage catheter secondary to sedimentation despite the fact that
he flushes the nephrostomy 2-3 times per day.

As such, patient presents today for fluoroscopic guided
exchange/conversion of the left-sided nephrostomy catheter. As he is
due for routine exchange of the contralateral right-sided
nephrostomy catheter next week, we will also exchange the
right-sided nephrostomy catheter today.

EXAM:
1. FLUOROSCOPIC GUIDED EXCHANGE OF LEFT-SIDED 12 SOM SKILES
ACHARY CATHETER TO A 12 FRENCH ALL-PURPOSE DRAINAGE CATHETER
2. FLUOROSCOPIC GUIDED EXCHANGE AND UP SIZE OF 10 FRENCH NEPHROSTOMY
CATHETER TO A 12 FRENCH NEPHROSTOMY CATHETER.
CONTRAST:  A total of 15 mL Omnipaque 300 administered was
administered into both collecting systems

FLUOROSCOPY TIME:  1 minute, 6 seconds (9 mGy)

COMPLICATIONS:
None immediate.
The bilateral flanks and external portions of existing nephrostomy
catheters were prepped and draped in the usual sterile fashion. A
sterile drape was applied covering the operative field. Maximum
barrier sterile technique with sterile gowns and gloves were used
for the procedure. A timeout was performed prior to the initiation
of the procedure.

A pre procedural spot fluoroscopic image was obtained. Beginning
with the left-sided nephrostomy, a small amount of contrast was
injected via the existing left-sided nephrostomy catheter
demonstrating appropriate positioning within the renal pelvis. The
existing nephrostomy catheter was cut and cannulated with short
Amplatz wire which was coiled within the renal pelvis.

Under intermittent fluoroscopic guidance, the existing 12 Keiara
Jerold Florestal type nephrostomy catheter was exchanged for a new 12
French standard all-purpose drainage catheter. Note, each pole of
all-purpose drainage catheter with slightly widening with the use of
a clamp. Limited contrast injection confirmed appropriate
positioning within the left renal pelvis and a post exchange
fluoroscopic image was obtained. The catheter was locked, secured to
the skin with an interrupted suture and reconnected to a gravity
bag.

The identical repeat procedure was repeated for the contralateral
right-sided nephrostomy, ultimately allowing successful exchange and
up sizing to a now 12 French all-purpose drainage catheter with end
coiled and locked within the right renal pelvis.

Dressings were placed. The patient tolerated the above procedures
well without immediate postprocedural complication.
FINDINGS: The existing nephrostomy catheters are appropriately positioned and
functioning.

After successful fluoroscopic guided exchange, the new bilateral 12
French nephrostomy catheters are appropriately positioned with end
coiled and locked within the bilateral renal pelves.
IMPRESSION: 1. Fluoroscopic guided exchange and conversion of left-sided 12
Dk Blind type catheter to a standard 12 French
all-purpose percutaneous nephrostomy catheter. Note, each of the
side holes from new left-sided nephrostomy catheter were made
slightly larger with the use of a clamp.
2. Fluoroscopic guided exchange and up sizing of now 12 French
right-sided percutaneous nephrostomy catheter.
